# Patient Record
Sex: Female | Born: 1935
Health system: Southern US, Community
[De-identification: ages and names within clinical notes are randomized; demographics above are authoritative.]

## PROBLEM LIST (undated history)

## (undated) DIAGNOSIS — J8410 Pulmonary fibrosis, unspecified: Secondary | ICD-10-CM

## (undated) DIAGNOSIS — Z9851 Tubal ligation status: Secondary | ICD-10-CM

## (undated) DIAGNOSIS — D869 Sarcoidosis, unspecified: Secondary | ICD-10-CM

## (undated) DIAGNOSIS — E7800 Pure hypercholesterolemia, unspecified: Secondary | ICD-10-CM

## (undated) DIAGNOSIS — I10 Essential (primary) hypertension: Secondary | ICD-10-CM

## (undated) DIAGNOSIS — K219 Gastro-esophageal reflux disease without esophagitis: Secondary | ICD-10-CM

## (undated) HISTORY — DX: Essential (primary) hypertension: I10

## (undated) HISTORY — DX: Gastro-esophageal reflux disease without esophagitis: K21.9

## (undated) HISTORY — DX: Pulmonary fibrosis, unspecified: J84.10

## (undated) HISTORY — DX: Sarcoidosis, unspecified: D86.9

## (undated) HISTORY — DX: Tubal ligation status: Z98.51

## (undated) HISTORY — DX: Pure hypercholesterolemia, unspecified: E78.00

---

## 1958-03-19 HISTORY — PX: LUNG SURGERY: SHX703

## 1958-03-19 HISTORY — PX: TUBAL LIGATION: SHX77

## 1998-02-16 ENCOUNTER — Encounter: Payer: Self-pay | Admitting: Internal Medicine

## 1998-02-16 ENCOUNTER — Ambulatory Visit (HOSPITAL_COMMUNITY): Admission: RE | Admit: 1998-02-16 | Discharge: 1998-02-16 | Payer: Self-pay | Admitting: Internal Medicine

## 1999-02-02 ENCOUNTER — Ambulatory Visit (HOSPITAL_COMMUNITY): Admission: RE | Admit: 1999-02-02 | Discharge: 1999-02-02 | Payer: Self-pay | Admitting: Internal Medicine

## 1999-02-02 ENCOUNTER — Encounter: Payer: Self-pay | Admitting: Internal Medicine

## 2000-02-05 ENCOUNTER — Ambulatory Visit (HOSPITAL_COMMUNITY): Admission: RE | Admit: 2000-02-05 | Discharge: 2000-02-05 | Payer: Self-pay | Admitting: Internal Medicine

## 2000-02-05 ENCOUNTER — Encounter: Payer: Self-pay | Admitting: Internal Medicine

## 2005-01-10 ENCOUNTER — Ambulatory Visit: Payer: Self-pay | Admitting: Family Medicine

## 2005-02-14 ENCOUNTER — Ambulatory Visit: Payer: Self-pay | Admitting: Internal Medicine

## 2005-04-18 ENCOUNTER — Ambulatory Visit: Payer: Self-pay

## 2005-06-08 ENCOUNTER — Ambulatory Visit: Payer: Self-pay | Admitting: Family Medicine

## 2005-10-12 ENCOUNTER — Ambulatory Visit: Payer: Self-pay | Admitting: Internal Medicine

## 2005-11-06 ENCOUNTER — Ambulatory Visit: Payer: Self-pay | Admitting: Family Medicine

## 2005-11-14 ENCOUNTER — Encounter: Admission: RE | Admit: 2005-11-14 | Discharge: 2005-11-14 | Payer: Self-pay | Admitting: Family Medicine

## 2006-07-24 ENCOUNTER — Ambulatory Visit: Payer: Self-pay | Admitting: Critical Care Medicine

## 2006-07-24 LAB — CONVERTED CEMR LAB
Angiotensin 1 Converting Enzyme: 34 units/L (ref 9–67)
Anti Nuclear Antibody(ANA): NEGATIVE
Pro B Natriuretic peptide (BNP): 17 pg/mL (ref 0.0–100.0)
Sed Rate: 65 mm/hr — ABNORMAL HIGH (ref 0–25)

## 2006-07-31 ENCOUNTER — Encounter: Payer: Self-pay | Admitting: Critical Care Medicine

## 2006-07-31 ENCOUNTER — Ambulatory Visit: Payer: Self-pay | Admitting: *Deleted

## 2006-08-19 ENCOUNTER — Ambulatory Visit: Payer: Self-pay | Admitting: Critical Care Medicine

## 2006-08-23 ENCOUNTER — Ambulatory Visit: Payer: Self-pay | Admitting: Critical Care Medicine

## 2006-11-20 ENCOUNTER — Encounter: Payer: Self-pay | Admitting: Critical Care Medicine

## 2006-11-20 ENCOUNTER — Ambulatory Visit: Payer: Self-pay | Admitting: Critical Care Medicine

## 2006-11-20 DIAGNOSIS — J45909 Unspecified asthma, uncomplicated: Secondary | ICD-10-CM

## 2006-11-20 DIAGNOSIS — D869 Sarcoidosis, unspecified: Secondary | ICD-10-CM

## 2006-11-20 DIAGNOSIS — J4520 Mild intermittent asthma, uncomplicated: Secondary | ICD-10-CM | POA: Insufficient documentation

## 2007-01-01 ENCOUNTER — Ambulatory Visit (HOSPITAL_COMMUNITY): Admission: RE | Admit: 2007-01-01 | Discharge: 2007-01-01 | Payer: Self-pay | Admitting: Family Medicine

## 2007-01-06 ENCOUNTER — Encounter (INDEPENDENT_AMBULATORY_CARE_PROVIDER_SITE_OTHER): Payer: Self-pay | Admitting: *Deleted

## 2007-02-01 ENCOUNTER — Encounter: Payer: Self-pay | Admitting: Critical Care Medicine

## 2007-02-01 DIAGNOSIS — E7800 Pure hypercholesterolemia, unspecified: Secondary | ICD-10-CM | POA: Insufficient documentation

## 2007-02-01 DIAGNOSIS — J8410 Pulmonary fibrosis, unspecified: Secondary | ICD-10-CM | POA: Insufficient documentation

## 2007-02-27 ENCOUNTER — Ambulatory Visit: Payer: Self-pay | Admitting: Critical Care Medicine

## 2007-05-29 ENCOUNTER — Ambulatory Visit: Payer: Self-pay | Admitting: Family Medicine

## 2007-05-29 DIAGNOSIS — I789 Disease of capillaries, unspecified: Secondary | ICD-10-CM | POA: Insufficient documentation

## 2007-06-03 ENCOUNTER — Ambulatory Visit: Payer: Self-pay | Admitting: Critical Care Medicine

## 2007-06-10 ENCOUNTER — Telehealth: Payer: Self-pay | Admitting: Family Medicine

## 2007-06-11 ENCOUNTER — Encounter: Payer: Self-pay | Admitting: Family Medicine

## 2007-08-18 ENCOUNTER — Ambulatory Visit: Payer: Self-pay | Admitting: Family Medicine

## 2007-08-25 LAB — CONVERTED CEMR LAB
ALT: 20 units/L (ref 0–35)
Basophils Absolute: 0 10*3/uL (ref 0.0–0.1)
Basophils Relative: 1 % (ref 0.0–1.0)
CO2: 26 meq/L (ref 19–32)
Calcium: 9.5 mg/dL (ref 8.4–10.5)
Cholesterol: 301 mg/dL (ref 0–200)
Creatinine, Ser: 0.8 mg/dL (ref 0.4–1.2)
Direct LDL: 223.3 mg/dL
GFR calc Af Amer: 91 mL/min
Glucose, Bld: 102 mg/dL — ABNORMAL HIGH (ref 70–99)
HCT: 35 % — ABNORMAL LOW (ref 36.0–46.0)
Hemoglobin: 12.2 g/dL (ref 12.0–15.0)
Lymphocytes Relative: 30 % (ref 12.0–46.0)
MCHC: 34.7 g/dL (ref 30.0–36.0)
Monocytes Absolute: 0.6 10*3/uL (ref 0.1–1.0)
Monocytes Relative: 14.8 % — ABNORMAL HIGH (ref 3.0–12.0)
Neutro Abs: 1.9 10*3/uL (ref 1.4–7.7)
RBC: 3.8 M/uL — ABNORMAL LOW (ref 3.87–5.11)
RDW: 12.1 % (ref 11.5–14.6)
Sodium: 139 meq/L (ref 135–145)
TSH: 1.71 microintl units/mL (ref 0.35–5.50)
Total Protein: 7.8 g/dL (ref 6.0–8.3)
VLDL: 25 mg/dL (ref 0–40)

## 2007-08-26 ENCOUNTER — Encounter (INDEPENDENT_AMBULATORY_CARE_PROVIDER_SITE_OTHER): Payer: Self-pay | Admitting: *Deleted

## 2007-09-05 ENCOUNTER — Ambulatory Visit: Payer: Self-pay | Admitting: Internal Medicine

## 2007-09-09 ENCOUNTER — Encounter (INDEPENDENT_AMBULATORY_CARE_PROVIDER_SITE_OTHER): Payer: Self-pay | Admitting: *Deleted

## 2007-09-16 ENCOUNTER — Encounter (INDEPENDENT_AMBULATORY_CARE_PROVIDER_SITE_OTHER): Payer: Self-pay | Admitting: *Deleted

## 2007-09-16 ENCOUNTER — Ambulatory Visit: Payer: Self-pay | Admitting: Family Medicine

## 2007-09-17 ENCOUNTER — Encounter: Payer: Self-pay | Admitting: Internal Medicine

## 2007-09-17 ENCOUNTER — Ambulatory Visit: Payer: Self-pay | Admitting: Internal Medicine

## 2007-10-17 ENCOUNTER — Telehealth: Payer: Self-pay | Admitting: Internal Medicine

## 2007-11-04 ENCOUNTER — Telehealth (INDEPENDENT_AMBULATORY_CARE_PROVIDER_SITE_OTHER): Payer: Self-pay | Admitting: *Deleted

## 2007-11-05 ENCOUNTER — Ambulatory Visit: Payer: Self-pay | Admitting: Internal Medicine

## 2007-11-05 DIAGNOSIS — M949 Disorder of cartilage, unspecified: Secondary | ICD-10-CM | POA: Insufficient documentation

## 2007-11-05 DIAGNOSIS — M899 Disorder of bone, unspecified: Secondary | ICD-10-CM | POA: Insufficient documentation

## 2007-11-11 ENCOUNTER — Telehealth (INDEPENDENT_AMBULATORY_CARE_PROVIDER_SITE_OTHER): Payer: Self-pay | Admitting: *Deleted

## 2007-11-13 ENCOUNTER — Encounter: Payer: Self-pay | Admitting: Internal Medicine

## 2007-11-13 ENCOUNTER — Ambulatory Visit: Payer: Self-pay

## 2007-11-19 ENCOUNTER — Ambulatory Visit: Payer: Self-pay | Admitting: Critical Care Medicine

## 2007-11-20 ENCOUNTER — Encounter (INDEPENDENT_AMBULATORY_CARE_PROVIDER_SITE_OTHER): Payer: Self-pay | Admitting: *Deleted

## 2007-11-21 ENCOUNTER — Ambulatory Visit: Payer: Self-pay | Admitting: Cardiovascular Disease

## 2007-11-23 ENCOUNTER — Encounter: Payer: Self-pay | Admitting: Critical Care Medicine

## 2007-12-08 ENCOUNTER — Ambulatory Visit: Payer: Self-pay | Admitting: Internal Medicine

## 2008-01-30 ENCOUNTER — Ambulatory Visit (HOSPITAL_COMMUNITY): Admission: RE | Admit: 2008-01-30 | Discharge: 2008-01-30 | Payer: Self-pay | Admitting: Internal Medicine

## 2008-02-09 ENCOUNTER — Encounter (INDEPENDENT_AMBULATORY_CARE_PROVIDER_SITE_OTHER): Payer: Self-pay | Admitting: *Deleted

## 2008-03-08 ENCOUNTER — Ambulatory Visit: Payer: Self-pay | Admitting: Internal Medicine

## 2008-04-07 ENCOUNTER — Ambulatory Visit: Payer: Self-pay | Admitting: Internal Medicine

## 2008-04-09 ENCOUNTER — Telehealth (INDEPENDENT_AMBULATORY_CARE_PROVIDER_SITE_OTHER): Payer: Self-pay | Admitting: *Deleted

## 2008-04-09 LAB — CONVERTED CEMR LAB
ALT: 13 units/L (ref 0–35)
AST: 19 units/L (ref 0–37)
Direct LDL: 221.1 mg/dL

## 2008-09-22 ENCOUNTER — Ambulatory Visit: Payer: Self-pay | Admitting: Internal Medicine

## 2008-09-22 DIAGNOSIS — M1990 Unspecified osteoarthritis, unspecified site: Secondary | ICD-10-CM | POA: Insufficient documentation

## 2008-09-22 DIAGNOSIS — R1310 Dysphagia, unspecified: Secondary | ICD-10-CM | POA: Insufficient documentation

## 2008-09-28 ENCOUNTER — Ambulatory Visit: Payer: Self-pay | Admitting: Internal Medicine

## 2008-09-28 LAB — CONVERTED CEMR LAB
BUN: 29 mg/dL — ABNORMAL HIGH (ref 6–23)
Creatinine, Ser: 0.8 mg/dL (ref 0.4–1.2)

## 2008-10-01 LAB — CONVERTED CEMR LAB
Cholesterol: 303 mg/dL — ABNORMAL HIGH (ref 0–200)
HDL: 57 mg/dL (ref >39.00)
Triglycerides: 98 mg/dL (ref 0.0–149.0)
Vit D, 25-Hydroxy: 27 ng/mL — ABNORMAL LOW (ref 30–89)

## 2008-10-05 ENCOUNTER — Ambulatory Visit: Payer: Self-pay | Admitting: Internal Medicine

## 2008-10-05 ENCOUNTER — Encounter (INDEPENDENT_AMBULATORY_CARE_PROVIDER_SITE_OTHER): Payer: Self-pay | Admitting: *Deleted

## 2008-10-06 ENCOUNTER — Encounter (INDEPENDENT_AMBULATORY_CARE_PROVIDER_SITE_OTHER): Payer: Self-pay | Admitting: *Deleted

## 2008-10-11 ENCOUNTER — Encounter (INDEPENDENT_AMBULATORY_CARE_PROVIDER_SITE_OTHER): Payer: Self-pay | Admitting: *Deleted

## 2008-10-11 LAB — CONVERTED CEMR LAB: Fecal Occult Bld: NEGATIVE

## 2008-10-13 ENCOUNTER — Encounter: Admission: RE | Admit: 2008-10-13 | Discharge: 2008-10-13 | Payer: Self-pay | Admitting: Internal Medicine

## 2008-11-10 IMAGING — CT CT CHEST W/O CM
2 of 5 series · 12 of 36 positions shown, 15 images · IV contrast (agent unspecified)
Comparison: Chest x-ray of 07/24/06

CLINICAL DATA: History of pneumonia with cough in [REDACTED].  Shortness of breath upon exertion.  Evaluate for pulmonary fibrosis.  
 CHEST CT WITHOUT CONTRAST:
TECHNIQUE: Multidetector CT imaging of the chest was performed following the standard protocol without IV contrast.  High resolution imaging of the lungs was performed.

[Series 2: chest_hi_res 5.0 b40f st · axial · 0.60mm/px · z∈[+998,+1222]mm · 9 of 57 slices shown, 12 images]
[im 6/57  mediastinal]
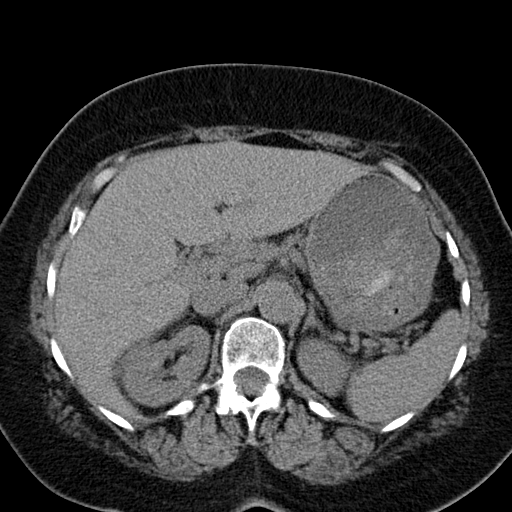
[im 6/57  lung]
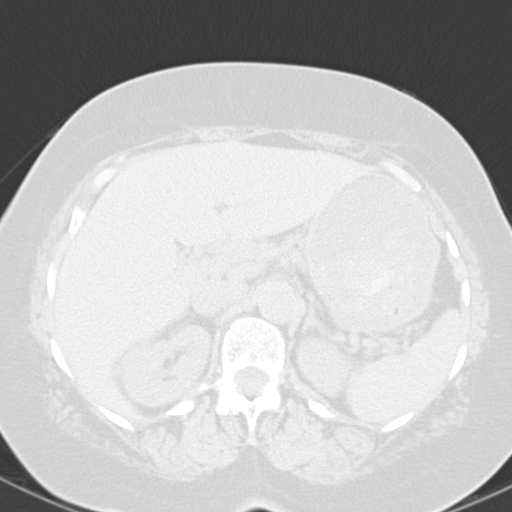
[im 12/57  lung]
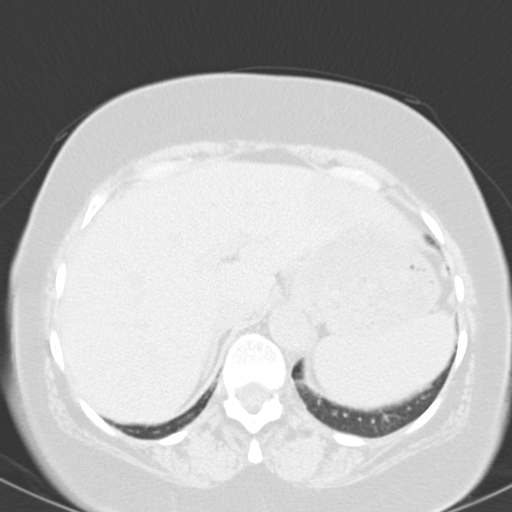
[im 17/57  lung]
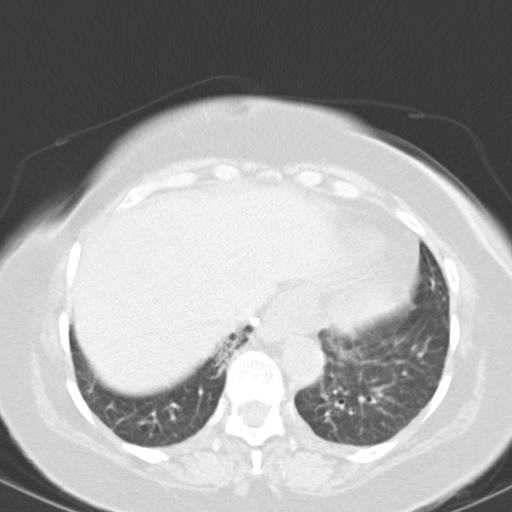
[im 23/57  lung]
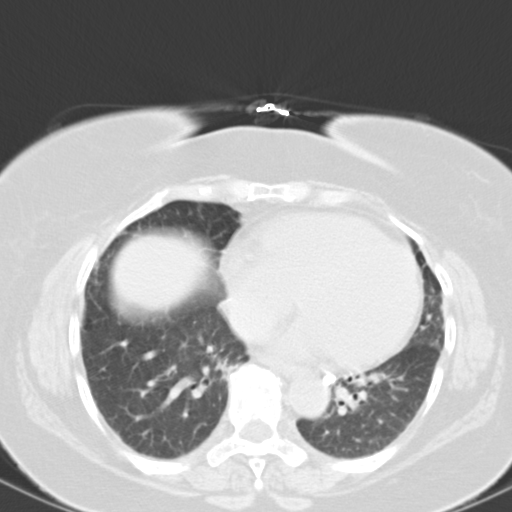
[im 29/57  mediastinal]
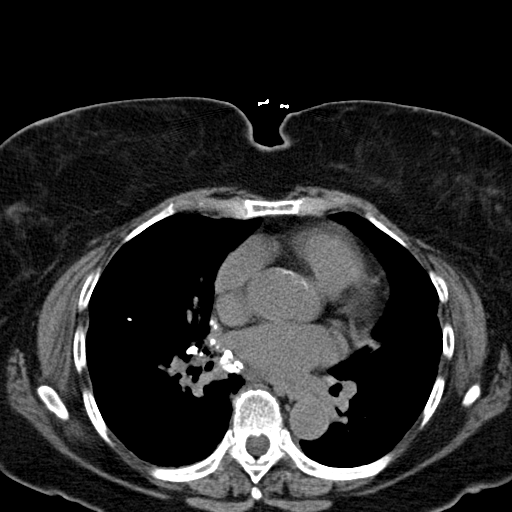
[im 29/57  lung]
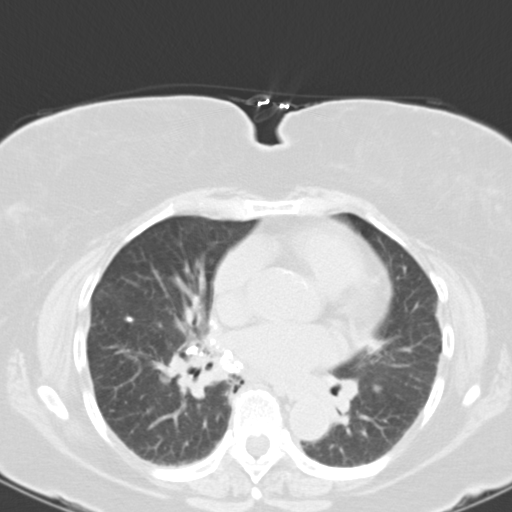
[im 34/57  lung]
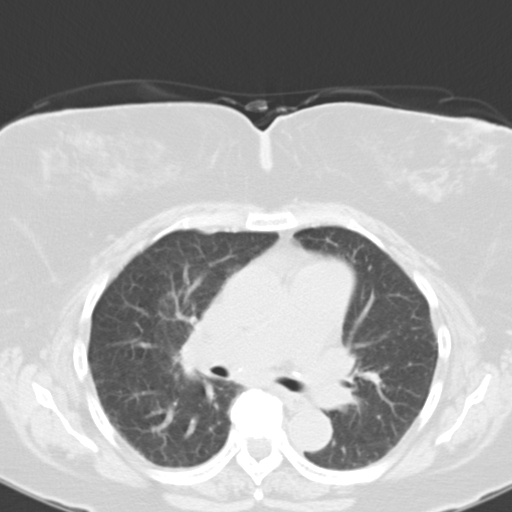
[im 40/57  lung]
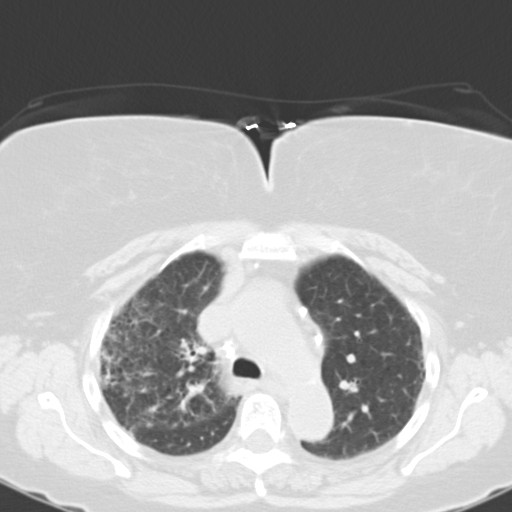
[im 45/57  lung]
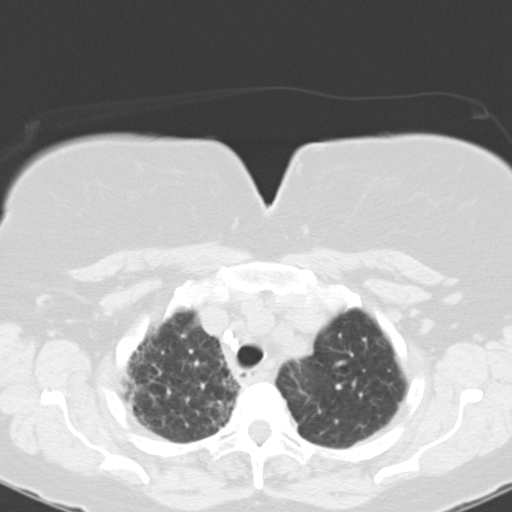
[im 51/57  mediastinal]
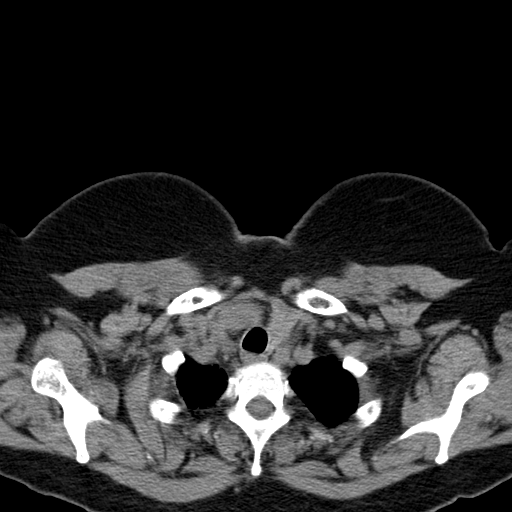
[im 51/57  lung]
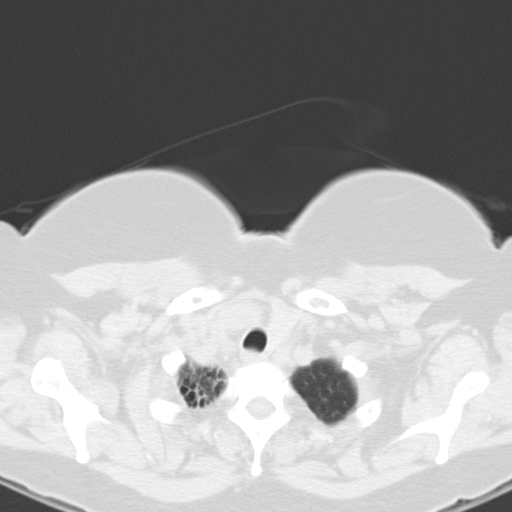

[Series 602: <mpr thick range> · coronal · 0.60mm/px · 3 of 82 slices shown]
[im 17/82  lung]
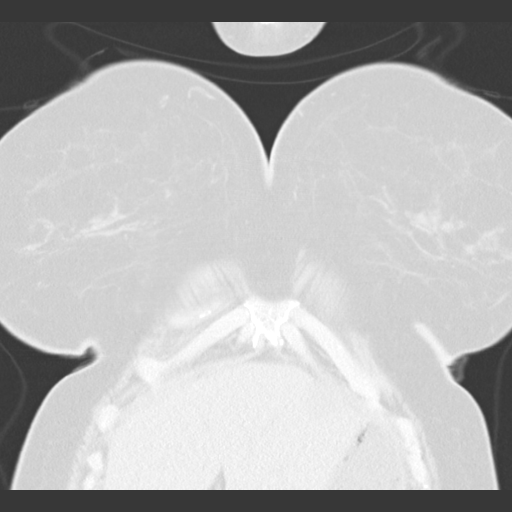
[im 33/82  lung]
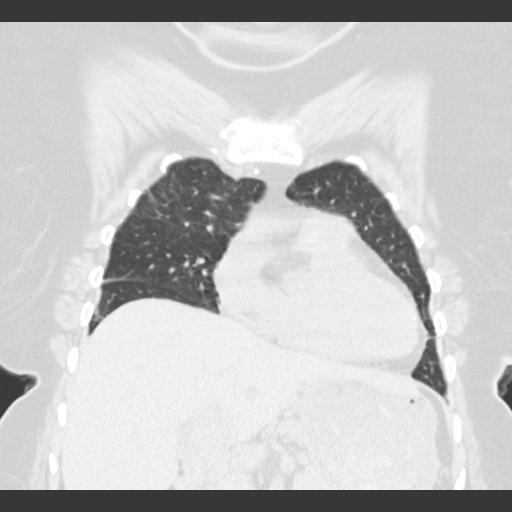
[im 49/82  lung]
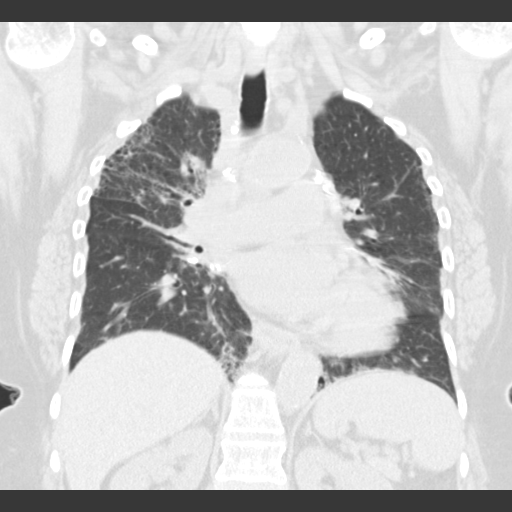

[12 of 36 positions shown; findings below may reference images not displayed]

FINDINGS: High right paratracheal lymph node is borderline in size measuring 1 cm in short axis.  There are other calcified lymph nodes in the mediastinum and both hilar regions.  Main pulmonary artery measures 3.1 cm.  Heart size is within upper limits of normal to mildly enlarged.  No pericardial fluid.  Small hiatal hernia with distal esophageal wall thickening.  
 Interstitial thickening and volume loss are worse in the right upper lobe.  Calcified granuloma is seen in the right upper lobe as well.  Mild subpleural opacities are seen bilaterally.  No pleural fluid.  Airway unremarkable.  
 A low-density lesion in the left hepatic lobe measures 3.6 x 2.8 cm.  No worrisome lytic or sclerotic lesions.
IMPRESSION: 1.  Combination of calcified/noncalcified mediastinal and hilar lymph nodes together with pulmonary parenchymal findings suggest sarcoid.  Post infectious scarring in the right upper lobe is another consideration.  
 2.  Pulmonary arterial hypertension.  Borderline heart size.  
 3.  Indeterminate left hepatic lobe lesion.  This cannot be characterized as a cyst on today?s study.  MR would be helpful in further evaluation, as clinically indicated.

## 2008-12-18 ENCOUNTER — Ambulatory Visit: Payer: Self-pay | Admitting: Internal Medicine

## 2008-12-18 ENCOUNTER — Telehealth: Payer: Self-pay | Admitting: Internal Medicine

## 2008-12-22 ENCOUNTER — Ambulatory Visit: Payer: Self-pay | Admitting: Internal Medicine

## 2008-12-24 ENCOUNTER — Telehealth: Payer: Self-pay | Admitting: Internal Medicine

## 2008-12-27 ENCOUNTER — Telehealth: Payer: Self-pay | Admitting: Internal Medicine

## 2008-12-28 ENCOUNTER — Ambulatory Visit: Payer: Self-pay | Admitting: Internal Medicine

## 2008-12-31 ENCOUNTER — Telehealth (INDEPENDENT_AMBULATORY_CARE_PROVIDER_SITE_OTHER): Payer: Self-pay | Admitting: *Deleted

## 2008-12-31 ENCOUNTER — Encounter (INDEPENDENT_AMBULATORY_CARE_PROVIDER_SITE_OTHER): Payer: Self-pay | Admitting: *Deleted

## 2009-01-10 ENCOUNTER — Encounter: Admission: RE | Admit: 2009-01-10 | Discharge: 2009-02-04 | Payer: Self-pay | Admitting: Internal Medicine

## 2009-01-11 ENCOUNTER — Encounter: Payer: Self-pay | Admitting: Internal Medicine

## 2009-01-19 ENCOUNTER — Ambulatory Visit: Payer: Self-pay | Admitting: Internal Medicine

## 2009-02-03 ENCOUNTER — Encounter: Payer: Self-pay | Admitting: Internal Medicine

## 2009-03-02 ENCOUNTER — Ambulatory Visit (HOSPITAL_COMMUNITY): Admission: RE | Admit: 2009-03-02 | Discharge: 2009-03-02 | Payer: Self-pay | Admitting: Internal Medicine

## 2009-03-07 ENCOUNTER — Telehealth (INDEPENDENT_AMBULATORY_CARE_PROVIDER_SITE_OTHER): Payer: Self-pay | Admitting: *Deleted

## 2009-03-08 ENCOUNTER — Encounter (INDEPENDENT_AMBULATORY_CARE_PROVIDER_SITE_OTHER): Payer: Self-pay | Admitting: *Deleted

## 2009-09-29 ENCOUNTER — Ambulatory Visit: Payer: Self-pay | Admitting: Internal Medicine

## 2009-09-29 LAB — CONVERTED CEMR LAB: Vit D, 25-Hydroxy: 39 ng/mL (ref 30–89)

## 2009-09-30 ENCOUNTER — Encounter (INDEPENDENT_AMBULATORY_CARE_PROVIDER_SITE_OTHER): Payer: Self-pay | Admitting: *Deleted

## 2009-10-03 LAB — CONVERTED CEMR LAB
Basophils Absolute: 0 10*3/uL (ref 0.0–0.1)
Basophils Relative: 0.7 % (ref 0.0–3.0)
CO2: 28 meq/L (ref 19–32)
Calcium: 9.8 mg/dL (ref 8.4–10.5)
Creatinine, Ser: 0.9 mg/dL (ref 0.4–1.2)
Eosinophils Absolute: 0.3 10*3/uL (ref 0.0–0.7)
GFR calc non Af Amer: 82.8 mL/min (ref >60)
HDL: 61.3 mg/dL (ref >39.00)
Lymphocytes Relative: 47.3 % — ABNORMAL HIGH (ref 12.0–46.0)
MCHC: 34.1 g/dL (ref 30.0–36.0)
Monocytes Relative: 11.6 % (ref 3.0–12.0)
Neutrophils Relative %: 35.1 % — ABNORMAL LOW (ref 43.0–77.0)
RBC: 4.05 M/uL (ref 3.87–5.11)
TSH: 2.19 microintl units/mL (ref 0.35–5.50)
Triglycerides: 174 mg/dL — ABNORMAL HIGH (ref 0.0–149.0)
WBC: 4.8 10*3/uL (ref 4.5–10.5)

## 2009-10-12 ENCOUNTER — Encounter: Admission: RE | Admit: 2009-10-12 | Discharge: 2009-10-12 | Payer: Self-pay | Admitting: Internal Medicine

## 2009-10-12 ENCOUNTER — Encounter: Payer: Self-pay | Admitting: Internal Medicine

## 2009-10-28 ENCOUNTER — Telehealth: Payer: Self-pay | Admitting: Internal Medicine

## 2010-03-03 ENCOUNTER — Ambulatory Visit (HOSPITAL_COMMUNITY)
Admission: RE | Admit: 2010-03-03 | Discharge: 2010-03-03 | Payer: Self-pay | Source: Home / Self Care | Attending: Internal Medicine | Admitting: Internal Medicine

## 2010-03-21 ENCOUNTER — Ambulatory Visit
Admission: RE | Admit: 2010-03-21 | Discharge: 2010-03-21 | Payer: Self-pay | Source: Home / Self Care | Attending: Internal Medicine | Admitting: Internal Medicine

## 2010-03-23 ENCOUNTER — Encounter: Payer: Self-pay | Admitting: Gastroenterology

## 2010-03-23 LAB — CONVERTED CEMR LAB
Basophils Absolute: 0 10*3/uL (ref 0.0–0.1)
HCT: 37.7 % (ref 36.0–46.0)
Lymphs Abs: 1.4 10*3/uL (ref 0.7–4.0)
MCV: 91.5 fL (ref 78.0–100.0)
Monocytes Absolute: 0.5 10*3/uL (ref 0.1–1.0)
Platelets: 296 10*3/uL (ref 150.0–400.0)
RDW: 13.3 % (ref 11.5–14.6)

## 2010-04-16 LAB — CONVERTED CEMR LAB: Pap Smear: NORMAL

## 2010-04-18 NOTE — Assessment & Plan Note (Signed)
Summary: yearly check/cbs   Vital Signs:  Patient profile:   77 year old female Height:      61.5 inches Weight:      196 pounds BMI:     36.57 Pulse rate:   80 / minute Pulse rhythm:   regular BP sitting:   128 / 86  (left arm) Cuff size:   large  Vitals Entered By: Army Fossa CMA (September 29, 2009 2:50 PM) CC: Yearly Eval Comments Decline Pneumovax.    History of Present Illness: Here for Medicare AWV: 1.   Risk factors based on Past M, S, F history: YES  2.   Physical Activities: still works, active, exercises in the "Y" 3.   Depression/mood: mood normal, denies depression  4.   Hearing: normal, no problems  5.   ADL's: totally independent 6.   Fall Risk: no recent falls, low risk 7.   Home Safety: reviewed, has her fire detectors updated  8.   Height, weight, &visual acuity:see VS,  vision corrected 9.   Counseling: done, see below  10.   Labs ordered based on risk factors:  yes  11.           Referral Coordination-- yes  12.           Care Plan:  see a/p  13.            Cognitive Assessment : memory, alertness and motor skills seem appropriate  in addition to her physical exam, we assesed  the following issues: -complained of "weakness and soreness" at the anterior aspect of her right thight, symptoms triggered by walking. she denies back pain or a rash in that area. An OTC cream ("deep-heat") used to help -has noted a cyst in her face below the left jaw -HYPERCHOLESTEROLEMIA -- on fish oil  and lifestyle modification. She does very well with diet and exercise -ASTHMA --  asx most of the year, very few times a year she does need a inhaler     Allergies (verified): No Known Drug Allergies  Past History:  Past Medical History: PULMONARY FIBROSIS and h/o SARCOIDOSIS   HYPERCHOLESTEROLEMIA   ASTHMA  8-09: Normal stress test Abnormal CT liver 09-2008: thyroid nodule?, ultrasound was negative  Past Surgical History: Reviewed history from 09/22/2008 and no  changes required. surgery on rt lung (1960's)  Family History: Reviewed history from 09/22/2008 and no changes required. 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 Resp:  Denies cough and shortness of breath. GI:  Denies bloody stools, diarrhea, nausea, and vomiting. GU:  Denies dysuria and hematuria. Psych:  Denies anxiety and depression.  Physical Exam  General:  alert, well-developed, and overweight-appearing.   Head:  under the left jaw she has a 3/4 cm sebaceous cyst (noted some sebaceous d/c) Neck:  no masses and no thyromegaly.   Breasts:  bruise at the upper external quadrant of the  R breast, no hematoma that I can tell, very tender to palpation ,no axillary lymph nodes Lungs:  normal respiratory effort, no intercostal retractions, no  accessory muscle use, and normal breath sounds.   Heart:  normal rate, regular rhythm, and no murmur.   Abdomen:  soft, non-tender, no distention, no masses, no guarding, and no rigidity.   Msk:  left hip full range of motion without pain Right hip with full range of motion, some soreness elicited with inward  rotation Pulses:  good pedal and femoral pulses bilaterally Extremities:  no lower extremity edema no tender to palpation at the proximal right lower extremity Neurologic:  alert & oriented X3, strength normal in all extremities, and gait normal.   Psych:  not anxious appearing and not depressed appearing.     Impression & Recommendations:  Problem # 1:  HEALTH SCREENING (ICD-V70.0)  Td 2004 has previously declined pneumovax , declined  again explained benefits  shingle shot information provided   has been reluctant to see gynecology Last Mammogram--02-2009 negative breast exam today and normal Last PAP Smear:  03/19/2001, n   Results:  normal --- declined any more PAPs , explained that i Rec one q 3 years    Last Colonoscopy:  03/19/1988,    Results:  normal  iFOB neg 7-10 "i would do a iFOB but not a Cscope" iFOB provided  Orders: First annual wellness visit with prevention plan  (V7846)  Problem # 2:  DEGENERATIVE JOINT DISEASE (ICD-715.90) right lower extremity pain likely due to  OA  recommend Mobic The following medications were removed from the medication list:    Tramadol Hcl 50 Mg Tabs (Tramadol hcl) .Marland Kitchen... 1/2-1 three times a day as needed for pain Her updated medication list for this problem includes:    Baby Aspirin 81 Mg Chew (Aspirin) .Marland Kitchen... 1 a day    Meloxicam 15 Mg Tabs (Meloxicam) ..... One by mouth daily as needed for pain  Problem # 3:  OSTEOPENIA (ICD-733.90) DEXA  7-09: osteopenia History of low vitamin D, ergocalciferol was prescribed Plan: DEXA and vitamin D check    Orders: TLB-CBC Platelet - w/Differential (85025-CBCD) T-Vitamin D (25-Hydroxy) (96295-28413) Venipuncture (24401) Specimen Handling (02725) Radiology Referral (Radiology)  Problem # 4:  HYPERCHOLESTEROLEMIA (ICD-272.0) labs Labs Reviewed: SGOT: 23 (09/28/2008)   SGPT: 13 (09/28/2008)   HDL:57.00 (09/28/2008), 49.6 (04/07/2008)  LDL:DEL (04/07/2008), DEL (08/18/2007)  Chol:303 (09/28/2008), 292 (04/07/2008)  Trig:98.0 (09/28/2008), 105 (04/07/2008)  Orders: TLB-BMP (Basic Metabolic Panel-BMET) (80048-METABOL) TLB-Lipid Panel (80061-LIPID) TLB-TSH (Thyroid Stimulating Hormone) (84443-TSH) TLB-ALT (SGPT) (84460-ALT) TLB-AST (SGOT) (84450-SGOT)  Problem # 5:  ASTHMA (ICD-493.90) well-controlled, rarely use albuterol Her updated medication list for this problem includes:    Proair Hfa 108 (90 Base) Mcg/act Aers  (Albuterol sulfate) .Marland Kitchen... 1-2 puffs every 4-6 hours as needed    Complete Medication List: 1)  Proair Hfa 108 (90 Base) Mcg/act Aers (Albuterol sulfate) .Marland Kitchen.. 1-2 puffs every 4-6 hours as needed 2)  Baby Aspirin 81 Mg Chew (Aspirin) .Marland Kitchen.. 1 a day 3)  Calcium 1000mg  A Day With Vit D 600 To 1000u A Day  4)  Fish Oil 1000 Mg Caps (Omega-3 fatty acids) 5)  Meloxicam 15 Mg Tabs (Meloxicam) .... One by mouth daily as needed for pain  Patient Instructions: 1)  for pain, take  meloxicam 15 mg mg one a day. Always take it with food. Stop  this medication if it upset your stomach  2)  Please schedule a follow-up appointment in 1 year.  Prescriptions: MELOXICAM 15 MG TABS (MELOXICAM) one by mouth daily as needed for pain  #30 x 1   Entered and Authorized by:  Anastassia Noack E. Lujain Kraszewski MD   Signed by:   Nolon Rod. Neena Beecham MD on 09/29/2009   Method used:   Print then Give to Patient   RxID:   [id number]

## 2010-04-18 NOTE — Letter (Signed)
Summary: [facility]: Immunoassay Fecal Occult Blood (iFOB) Order Form  [facility] [place]  [address] [place], [place] [address]   Phone: [phone]  Fax: [phone]      [facility]: Immunoassay Fecal Occult Blood (iFOB) Order Form   September 30, 2009 MRN: [id number]   Felicia Martin 1932-07-12   Physicican Name:_____Jose Drue Novel, MD____________________  Diagnosis Code:______v76.51____________________      Army Fossa CMA

## 2010-04-18 NOTE — Progress Notes (Signed)
Summary: refill  Phone Note Refill Request   Refills Requested: Medication #1:  MELOXICAM 15 MG TABS one by mouth daily as needed for pain   Last Refilled: 09/29/2009 Pharm- [facility] [place] [facility] that this helped with her knee pain. Army Fossa CMA  October 28, 2009 1:09 PM    Follow-up for Phone Call        ok 30, no Rf Deidre Carino E. Coltrane Tugwell MD  October 28, 2009 5:04 PM   Additional Follow-up for Phone Call Additional follow up Details #1::        Pt is aware. Army Fossa CMA  October 28, 2009 5:07 PM     Prescriptions: MELOXICAM 15 MG TABS (MELOXICAM) one by mouth daily as needed for pain  #30 x 1   Entered by:   Army Fossa CMA   Authorized by:   Nolon Rod. Ellakate Gonsalves MD   Signed by:   Army Fossa CMA on 10/28/2009   Method used:   Electronically to        [facility] [address] (retail)       [phone]       [place], [place]  [address]       Ph: [phone]       Fax: [phone]   RxID:   [id number]  Cancelled the refill on Rx. Army Fossa CMA  October 28, 2009 5:05 PM

## 2010-04-20 NOTE — Letter (Signed)
Summary: New Patient letter  [facility]  [address] [place], [place] [address]   Phone: [phone]  Fax: [phone]       03/23/2010 MRN: [id number]  [facility] [address] [place], [place]  [address]  Dear Felicia Martin,  Welcome to the Gastroenterology Division at [facility].    You are scheduled to see Dr.   Christella Hartigan on 05-01-10 at 9am on the 3rd floor at [facility], [address]  We ask that you try to arrive at our office 15 minutes prior to your appointment time to allow for check-in.  We would like you to complete the enclosed self-administered evaluation form prior to your visit and bring it with you on the day of your appointment.  We will review it with you.  Also, please bring a complete list of all your medications or, if you prefer, bring the medication bottles and we will list them.  Please bring your insurance card so that we may make a copy of it.  If your insurance requires a referral to see a specialist, please bring your referral form from your primary care physician.  Co-payments are due at the time of your visit and may be paid by cash, check or credit card.     Your office visit will consist of a consult with your physician (includes a physical exam), any laboratory testing he/she may order, scheduling of any necessary diagnostic testing (e.g. x-ray, ultrasound, CT-scan), and scheduling of a procedure (e.g. Endoscopy, Colonoscopy) if required.  Please allow enough time on your schedule to allow for any/all of these possibilities.    If you cannot keep your appointment, please call [phone] to cancel or reschedule prior to your appointment date.  This allows [place] the opportunity to schedule an appointment for another patient in need of care.  If you do not cancel or reschedule by 5 p.m. the business day prior to your appointment date, you will be charged a $50.00 late cancellation/no-show fee.    Thank you for choosing  [facility] for your medical needs.  We appreciate the opportunity to care for you.  Please visit [place] at our website  to learn more about our practice.                     Sincerely,                                                             The Gastroenterology Division

## 2010-04-20 NOTE — Assessment & Plan Note (Signed)
Summary: FOLLOWUP/KN   Vital Signs:  Patient profile:   77 year old female Weight:      202 pounds Pulse rate:   70 / minute Pulse rhythm:   regular BP sitting:   128 / 88  (left arm) Cuff size:   large  Vitals Entered By: Army Fossa CMA (March 21, 2010 10:30 AM) CC: Follow up visit- not fasting  Comments c/o having indgestion x 2-3 weeks. Unable to eat without getting  [facility] [address]    History of Present Illness: base on the last FLP, was Rx  Zocor, she took it  for 2 months but she self discontinued it   ~ one  month ago due to GI symptoms. See next.  for several weeks is noting  "indigestion" described as food getting stuck in her throat when she tries to swallow, also whenever she lies down the food seems to come up to her throat, causing upper chest discomfort and even left shoulder discomfort. There is no associated shortness of breath but a lot of burping.     Current Medications (verified): 1)  Proair Hfa 108 (90 Base) Mcg/act  Aers (Albuterol Sulfate) .Marland Kitchen.. 1-2 Puffs Every 4-6 Hours As Needed 2)  Baby Aspirin 81 Mg  Chew (Aspirin) .Marland Kitchen.. 1 A Day 3)  Calcium 1000mg  A Day With Vit D 600 To 1000u A Day 4)  Fish Oil 1000 Mg Caps (Omega-3 Fatty Acids) 5)  Meloxicam 15 Mg Tabs (Meloxicam) .... One By Mouth Daily As Needed For Pain  Allergies (verified): No Known Drug Allergies  Past History:  Past Medical History: Reviewed history from 09/29/2009 and no changes required. PULMONARY FIBROSIS and h/o SARCOIDOSIS   HYPERCHOLESTEROLEMIA   ASTHMA  8-09: Normal stress test Abnormal CT liver 09-2008: thyroid nodule?, ultrasound was negative  Past Surgical History: Reviewed history from 09/22/2008 and no changes required. surgery on rt lung (1960's)  Review of Systems General:  Denies chills and fever. CV:  Denies fainting, palpitations, and swelling of feet; no SSCP . Resp:  Denies coughing up blood; some cough, dry  asthma always well controlled up until last  week: used inhaler 2-3 /day. GI:  Denies abdominal pain, bloody stools, nausea, and vomiting.  Physical Exam  General:  alert, well-developed, and overweight-appearing.   Neck:  no lymphadenopathy, no supraclavicular mass Lungs:  normal respiratory effort, no intercostal retractions, no accessory muscle use, and normal breath sounds.   Heart:  normal rate, regular rhythm, and no murmur.   Abdomen:  soft, normal bowel sounds, no distention, no masses, no guarding, and no rigidity.     Impression & Recommendations:  Problem # 1:  DYSPHAGIA UNSPECIFIED (NWG-956.21) Assessment New 77 year old lady with a new onset of dysphagia and apparently heartburn. ( she  had  dysphagia 2010 for a few days, sx self resolved) plan: PPIs Chest x-ray GI referral  Orders: Venipuncture (30865) TLB-CBC Platelet - w/Differential (85025-CBCD) T-2 View CXR (71020TC) Specimen Handling (78469) Gastroenterology Referral (GI)  Problem # 2:  HYPERCHOLESTEROLEMIA (ICD-272.0) based on her last FLP, she was prescribed cholesterol medicine but d/t  to GI symptoms (see #1) she discontinue the statins reassess situation when she comes back The following medications were removed from the medication list:    Zocor 40 Mg Tabs (Simvastatin) .Marland Kitchen... 1 by mouth at bedtime.  Labs Reviewed: SGOT: 21 (09/29/2009)   SGPT: 13 (09/29/2009)   HDL:61.30 (09/29/2009), 57.00 (09/28/2008)  LDL:DEL (04/07/2008), DEL (08/18/2007)  Chol:289 (09/29/2009), 303 (09/28/2008)  Trig:174.0 (09/29/2009), 98.0 (09/28/2008)  Problem # 3:  ASTHMA (ICD-493.90) symptoms recently exacerbated, likely due to GERD Her updated medication list for this problem includes:    Proair Hfa 108 (90 Base) Mcg/act Aers (Albuterol sulfate) .Marland Kitchen... 1-2 puffs every 4-6 hours as needed  Complete Medication List: 1)  Proair Hfa 108 (90 Base) Mcg/act Aers (Albuterol sulfate) .Marland Kitchen.. 1-2 puffs every 4-6 hours as needed 2)  Baby Aspirin 81 Mg Chew (Aspirin) .Marland Kitchen.. 1 a  day 3)  Calcium 1000mg  A Day With Vit D 600 To 1000u A Day  4)  Fish Oil 1000 Mg Caps (Omega-3 fatty acids) 5)  Meloxicam 15 Mg Tabs (Meloxicam) .... One by mouth daily as needed for pain 6)  Pantoprazole Sodium 40 Mg Tbec (Pantoprazole sodium) .... One by mouth before breakfast on a empty stomach  Patient Instructions: 1)  start pantoprazole as directed 2)  If unable to get pantoprazole , get prilosec 20 mg over-the-counter and take 2 tablets before breakfast 3)  Please schedule a follow-up appointment in 2 to 3 months , fasting Prescriptions: PANTOPRAZOLE SODIUM 40 MG TBEC (PANTOPRAZOLE SODIUM) One by mouth before breakfast on a empty stomach  #30 x 3   Entered and Authorized by:   Nolon Rod. Kyerra Vargo MD   Signed by:   Nolon Rod. Saryiah Bencosme MD on 03/21/2010   Method used:   Print then Give to Patient   RxID:   [id number]    Orders Added: 1)  Venipuncture [01027] 2)  TLB-CBC Platelet - w/Differential [85025-CBCD] 3)  T-2 View CXR [71020TC] 4)  Specimen Handling [99000] 5)  Est. Patient Level IV [25366] 6)  Gastroenterology Referral [GI]    Prevention & Chronic Care Immunizations   Influenza vaccine: Not documented    Tetanus booster: 03/19/2002: Td    Pneumococcal vaccine: Not documented    H. zoster vaccine: Not documented  Colorectal Screening   Hemoccult: Not documented    Colonoscopy: normal  (03/19/1988)  Other Screening   Pap smear: normal  (03/19/2001)    Mammogram: normal- repeat 1 year  (03/03/2010)    DXA bone density scan: normal  (03/19/2005)   Smoking status: quit  (05/29/2007)  Lipids   Total Cholesterol: 289  (09/29/2009)   LDL: DEL  (04/07/2008)   LDL Direct: 213.7  (09/29/2009)   HDL: 61.30  (09/29/2009)   Triglycerides: 174.0  (09/29/2009)    SGOT (AST): 21  (09/29/2009)   SGPT (ALT): 13  (09/29/2009)   Alkaline phosphatase: 63  (08/18/2007)   Total bilirubin: 0.7  (08/18/2007)  Self-Management Support :    Lipid self-management support: Not  documented

## 2010-05-01 ENCOUNTER — Ambulatory Visit: Payer: Self-pay | Admitting: Gastroenterology

## 2010-05-04 ENCOUNTER — Encounter: Payer: Self-pay | Admitting: Family Medicine

## 2010-05-04 ENCOUNTER — Ambulatory Visit: Payer: Self-pay | Admitting: Family Medicine

## 2010-05-04 ENCOUNTER — Ambulatory Visit (INDEPENDENT_AMBULATORY_CARE_PROVIDER_SITE_OTHER): Payer: Medicare Other | Admitting: Family Medicine

## 2010-05-04 DIAGNOSIS — N390 Urinary tract infection, site not specified: Secondary | ICD-10-CM

## 2010-05-04 LAB — CONVERTED CEMR LAB
Glucose, Urine, Semiquant: NEGATIVE
pH: 5

## 2010-05-05 ENCOUNTER — Encounter: Payer: Self-pay | Admitting: Family Medicine

## 2010-05-10 NOTE — Assessment & Plan Note (Signed)
Summary: bladder/kidney infection/kn   Vital Signs:  Patient profile:   77 year old female Height:      61.5 inches Weight:      201 pounds Temp:     98.4 degrees F oral Pulse rate:   76 / minute BP sitting:   160 / 76  (left arm)  Vitals Entered By: Jeremy Johann CMA (May 04, 2010 3:35 PM) CC: burning and frequency with urination, Dysuria   History of Present Illness:  Dysuria      This is a 77 year old woman who presents with Dysuria.  The symptoms began 2 days ago.  The patient complains of burning with urination, urinary frequency, and urgency, but denies hematuria, vaginal discharge, vaginal itching, and vaginal sores.  The patient denies the following associated symptoms: nausea, vomiting, fever, shaking chills, flank pain, abdominal pain, back pain, pelvic pain, and arthralgias.  The patient denies the following risk factors: diabetes, prior antibiotics, immunosuppression, history of GU anomaly, history of pyelonephritis, pregnancy, history of STD, and analgesic abuse.  History is significant for no urinary tract problems.    Current Medications (verified): 1)  Proair Hfa 108 (90 Base) Mcg/act  Aers (Albuterol Sulfate) .Marland Kitchen.. 1-2 Puffs Every 4-6 Hours As Needed 2)  Baby Aspirin 81 Mg  Chew (Aspirin) .Marland Kitchen.. 1 A Day 3)  Calcium 1000mg  A Day With Vit D 600 To 1000u A Day 4)  Fish Oil 1000 Mg Caps (Omega-3 Fatty Acids) 5)  Meloxicam 15 Mg Tabs (Meloxicam) .... One By Mouth Daily As Needed For Pain 6)  Pantoprazole Sodium 40 Mg Tbec (Pantoprazole Sodium) .... One By Mouth Before Breakfast On A Empty Stomach 7)  Cipro 500 Mg Tabs (Ciprofloxacin Hcl) .Marland Kitchen.. 1 By Mouth Two Times A Day  Allergies (verified): No Known Drug Allergies  Past History:  Past medical, surgical, family and social histories (including risk factors) reviewed for relevance to current acute and chronic problems.  Past Medical History: Reviewed history from 09/29/2009 and no changes required. PULMONARY  FIBROSIS and h/o SARCOIDOSIS   HYPERCHOLESTEROLEMIA   ASTHMA  8-09: Normal stress test Abnormal CT liver 09-2008: thyroid nodule?, ultrasound was negative  Past Surgical History: Reviewed history from 09/22/2008 and no changes required. surgery on rt lung (1960's)  Family History: Reviewed history from 09/22/2008 and no changes required. CAD - no colon Ca - no breast Ca - no ovarian/uterine Ca - no HTN - sisters x3  DM - sister x 3  high chol - siblings M - deceased liver Ca  F - deceased lung Ca  osteoporosis--no  Social History: Reviewed history from 09/29/2009 and no changes required. 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 Extremities:  No clubbing, cyanosis, edema, or deformity noted with normal full range of motion of all joints.   Psych:  Cognition and judgment appear intact. Alert and cooperative with normal attention span and concentration. No apparent delusions, illusions, hallucinations   Impression & Recommendations:  Problem # 1:  UTI (ICD-599.0)  Her updated medication list for this problem includes:    Cipro 500 Mg Tabs (Ciprofloxacin hcl) .Marland Kitchen... 1 by mouth two times a day  Orders: Specimen Handling (21308) T-Culture, Urine (65784-69629) UA Dipstick  w/o Micro (manual) (46962)  Encouraged to push clear liquids, get enough rest, and take acetaminophen as needed. To be seen in 10 days if no improvement, sooner if worse.  Complete Medication List: 1)  Proair Hfa 108 (90 Base) Mcg/act Aers (Albuterol sulfate) .Marland Kitchen.. 1-2 puffs every 4-6 hours as needed 2)  Baby Aspirin 81 Mg Chew (Aspirin) .Marland Kitchen.. 1 a  day 3)  Calcium 1000mg  A Day With Vit D 600 To 1000u A Day  4)  Fish Oil 1000 Mg Caps (Omega-3 fatty acids) 5)  Meloxicam 15 Mg Tabs (Meloxicam) .... One by mouth daily as needed for pain 6)  Pantoprazole Sodium 40 Mg Tbec (Pantoprazole sodium) .... One by mouth before breakfast on a empty stomach 7)  Cipro 500 Mg Tabs (Ciprofloxacin hcl) .Marland Kitchen.. 1 by mouth two times a day Prescriptions: CIPRO 500 MG TABS (CIPROFLOXACIN HCL) 1 by mouth two times a day  #10 x 0   Entered and Authorized by:   Loreen Freud DO   Signed by:   Loreen Freud DO on 05/04/2010   Method used:   Electronically to        [facility] #[id number]* (retail)       [address] [place], [place]  [address]       Ph: [phone]       Fax: [phone]   RxID:   [id number]    Orders Added: 1)  Specimen Handling [99000] 2)  T-Culture, Urine [[id number]] 3)  Est. Patient Level III [01093] 4)  UA Dipstick w/o Micro (manual) [81002]    Laboratory Results   Urine Tests   Date/Time Reported: May 04, 2010 3:41 PM  Routine Urinalysis   Color: yellow Appearance: Clear Glucose: negative   (Normal Range: Negative) Bilirubin: negative   (Normal Range: Negative) Ketone: negative   (Normal Range: Negative) Spec. Gravity: 1.020   (Normal Range: 1.003-1.035) Blood: negative   (Normal Range: Negative) pH: 5.0   (Normal Range: 5.0-8.0) Protein: negative   (Normal Range: Negative) Urobilinogen: 0.2   (Normal Range: 0-1) Nitrite: negative   (Normal Range: Negative) Leukocyte Esterace: trace   (Normal Range: Negative)

## 2010-05-12 ENCOUNTER — Encounter: Payer: Self-pay | Admitting: Family Medicine

## 2010-05-12 ENCOUNTER — Ambulatory Visit (INDEPENDENT_AMBULATORY_CARE_PROVIDER_SITE_OTHER): Payer: Medicare Other | Admitting: Family Medicine

## 2010-05-12 DIAGNOSIS — B3731 Acute candidiasis of vulva and vagina: Secondary | ICD-10-CM | POA: Insufficient documentation

## 2010-05-12 DIAGNOSIS — B373 Candidiasis of vulva and vagina: Secondary | ICD-10-CM | POA: Insufficient documentation

## 2010-05-16 NOTE — Assessment & Plan Note (Signed)
Summary: vaginal itching/cdj   Vital Signs:  Patient profile:   77 year old female Weight:      203.8 pounds Temp:     98.1 degrees F oral BP sitting:   138 / 88  (left arm) Cuff size:   large  Vitals Entered By: Almeta Monas CMA Duncan Dull) (May 12, 2010 2:48 PM) CC: c/o itching to the outside of the Vagina x a few days--Denies any discharge   History of Present Illness: Pt here c/o vaginal itch--ext only.  No dysuria, no d/c .  No odor.  [facility] thought it was [facility] because others had complained as well.   no other complaints.    Current Medications (verified): 1)  Proair Hfa 108 (90 Base) Mcg/act  Aers (Albuterol Sulfate) .Marland Kitchen.. 1-2 Puffs Every 4-6 Hours As Needed 2)  Baby Aspirin 81 Mg  Chew (Aspirin) .Marland Kitchen.. 1 A Day 3)  Calcium 1000mg  A Day With Vit D 600 To 1000u A Day 4)  Fish Oil 1000 Mg Caps (Omega-3 Fatty Acids) 5)  Meloxicam 15 Mg Tabs (Meloxicam) .... One By Mouth Daily As Needed For Pain 6)  Pantoprazole Sodium 40 Mg Tbec (Pantoprazole Sodium) .... One By Mouth Before Breakfast On A Empty Stomach 7)  Nystatin 100000 Unit/gm Crea (Nystatin) .... Apply Two Times A Day  Allergies (verified): No Known Drug Allergies  Past History:  Past Medical History: Last updated: 09/29/2009 PULMONARY FIBROSIS and h/o SARCOIDOSIS   HYPERCHOLESTEROLEMIA   ASTHMA  8-09: Normal stress test Abnormal CT liver 09-2008: thyroid nodule?, ultrasound was negative  Past Surgical History: Last updated: 09/22/2008 surgery on rt lung (1960's)  Family History: Last updated: 09/22/2008 CAD - no colon Ca - no breast Ca - no ovarian/uterine Ca - no HTN - sisters x3  DM - sister x 3  high chol - siblings M - deceased liver Ca  F - deceased lung Ca  osteoporosis--no  Social History: Last updated: 09/29/2009 Former Smoker ETOH--no  Single 4 children  Drug use-no Regular exercise-yes diet-- healthy  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 CAD - no colon Ca - no breast Ca - no ovarian/uterine Ca - no HTN - sisters x3  DM - sister x 3  high chol - siblings M - deceased liver Ca  F - deceased lung Ca  osteoporosis--no  Social History: Reviewed history from 09/29/2009 and no changes required. 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 Alert and cooperative with normal attention span and concentration. No apparent delusions, illusions, hallucinations   Impression & Recommendations:  Problem # 1:  CANDIDIASIS, VULVAR (ICD-112.1)  Her updated medication list for this problem includes:    Nystatin 100000 Unit/gm Crea (Nystatin) .Marland Kitchen... Apply two times a day  Discussed treatment regimen and preventive measures.   Complete Medication List: 1)  Proair Hfa 108 (90 Base) Mcg/act Aers (Albuterol sulfate) .Marland Kitchen.. 1-2 puffs every 4-6 hours as needed 2)  Baby Aspirin 81 Mg Chew (Aspirin) .Marland Kitchen.. 1 a day 3)  Calcium  1000mg  A Day With Vit D 600 To 1000u A Day  4)  Fish Oil 1000 Mg Caps (Omega-3 fatty acids) 5)  Meloxicam 15 Mg Tabs (Meloxicam) .... One by mouth daily as needed for pain 6)  Pantoprazole Sodium 40 Mg Tbec (Pantoprazole sodium) .... One by mouth before breakfast on a empty stomach 7)  Nystatin 100000 Unit/gm Crea (Nystatin) .... Apply two times a day Prescriptions: NYSTATIN 100000 UNIT/GM CREA (NYSTATIN) apply two times a day  #30g x 1   Entered and Authorized by:   Loreen Freud DO   Signed by:   Loreen Freud DO on  05/12/2010   Method used:   Electronically to        [id number]* (retail)       [address] [place], [place]  [address]       Ph: [phone]       Fax: [phone]   RxID:   [id number]    Orders Added: 1)  Est. Patient Level III [72536]

## 2010-08-01 NOTE — Assessment & Plan Note (Signed)
[facility]                             PULMONARY OFFICE NOTE   Felicia Martin, Felicia Martin                   MRN:          [id number]  DATE:07/24/2006                            DOB:          1932-12-06    CHIEF COMPLAINT:  Evaluate pneumonia and cough.   HISTORY OF PRESENT ILLNESS:  This is a 77 year old African-American  female who had a bout of pneumonia in February of 2008 and was seen at  [facility] for three different visits.  There was an infiltrate in the  right upper lobe; she had several rounds of antibiotics, at that time  she had a clearing of the throat that was deep.  She had shortness of  breath with activity.  She was short of breath mopping the floor and  cleaning her house but not at rest.  She had a dry cough. She had no  chest pain.  She had occasional wheezing at night.  She smoked less than  a half-pack a day for 10 years but quit smoking 5 years ago. She does  have a sister with pulmonary fibrosis who I actually see as a patient.  She has no hemoptysis, chest pain, irregular heart beats. There is no  acid reflux symptoms, no loss of appetite, change in weight, abdominal  pain, there is no difficulty swallowing, there is no sore throat. There  is no tooth or dental problems.   PAST HISTORY:   MEDICAL:  History of hypertension, hypercholesterolemia.   ALLERGIES:  Significant medical allergies are none.   OPERATIVE HISTORY:  None.   SOCIAL HISTORY:  Smoking history as noted above. She works at [employer]   FAMILY HISTORY:  Emphysema in a sister. Cancer in her mother.   REVIEW OF SYSTEMS:  Otherwise noncontributory.   CURRENT MEDICATIONS:  None except Pro Air p.r.n.   PHYSICAL EXAMINATION:  VITAL SIGNS:  Temperature was 98, blood pressure  138/80, pulse 87, saturation was 95% on room air.  CHEST:  Showed distant breath sounds with no wheeze or rhonchi.  CARDIAC EXAM:  Showed a regular rate and rhythm without S3, normal S1,  S2.  ABDOMEN:  Soft, nontender.  EXTREMITIES:  Showed no edema, clubbing or venous disease.  NEUROLOGIC EXAM:  Intact.  HEENT EXAM:  Clear.   IMPRESSION:  Asthmatic bronchitis and potential pulmonary fibrosis.   PLAN:  1. Obtain a CT scan of the chest.  2. A full set of pulmonary function studies will be obtained.  3. We will also check an ANA, angiotensin diverting enzyme assay,      fungal panel, immuno assay, hypersensitivity panel, IGE and RAST      SAB and P.  Rheumatoid factor and sed rate.   Will see the patient back in followup once these results are completed.     Charlcie Cradle Delford Field, MD, [facility]  Electronically Signed    PEW/MedQ  DD: 07/25/2006  DT: 07/25/2006  Job #: [id number]

## 2010-08-01 NOTE — Assessment & Plan Note (Signed)
[facility]                             PULMONARY OFFICE NOTE   MCKINNA, DEMARS                     MRN:          [id number]  DATE:11/20/2006                            DOB:          05-Dec-1932    Ms. Koelzer is a 77 year old, African-American female with a history of  asthmatic bronchitis, chronic airflow obstruction, mediastinal and hilar  granulomas, compatible sarcoidosis, mild pulmonary hypertension. Overall  she is doing relatively well on Asmanex 2 sprays daily and Foradil 1  spray twice daily. She did request a reeducation on the Asmanex usage.   PHYSICAL EXAMINATION:  VITAL SIGNS:  Temperature 97, blood pressure  126/70, pulse 75, saturation 94% on room air.  CHEST:  Showed to be clear without evidence of wheeze or rhonchi.  CARDIAC:  Showed a regular rate and rhythm without S3, normal S1, S2.  ABDOMEN:  Soft, nontender.  EXTREMITIES:  Showed no edema or clubbing.   IMPRESSION:  1. Asthmatic bronchitis.  2. Pulmonary fibrosis.  3. Stable airflow function.   PLAN:  Maintain Foradil 1 spray b.i.d., Asmanex 2 sprays daily. Return  to this office in 4 months for a recheck.     Charlcie Cradle Delford Field, MD, [facility]  Electronically Signed    PEW/MedQ  DD: 11/20/2006  DT: 11/21/2006  Job #: [id number]

## 2010-08-01 NOTE — Assessment & Plan Note (Signed)
[facility]                             PULMONARY OFFICE NOTE   SHONTELL, PROSSER                     MRN:          [id number]  DATE:08/21/2006                            DOB:          08-Sep-1932    Ms. Felicia Martin is a 77 year old African-American female here for followup.  We prescribed Advair at the last visit, but her insurance refuses to pay  for this.  Therefore, she only got samples.  She is using 250/50 one  spray twice daily.  She is having improvement with her breathing.  Having less cough.  Less chest congestion.  Her lab studies __________  65, rheumatoid __________  than 20.  BNP of 17.  IgE level 36.  Normal  RAST assay.  Her hypersensitivity profile was negative.  Her ACE level  was 34, ANA was negative.  Rheumatoid factor was negative.  A CT scan of  the chest was obtained and reviewed.  It showed calcified and non-  calcified mediastinal hilar nodes and pulmonary parenchymal scarring,  interstitial thickening, worse in the right upper lobe.  She also had  changes compatible with pulmonary arterial hypertension.  Pulmonary  functions have already been obtained and were reviewed today with the  patient.  This revealed an FEV1 of 1.62, FVC of 2.04.  Total lung  capacity of 84% predicted, diffusion capacity of 29% of predicted.   EXAM:  Temperature 97.8, blood pressure 118/70, pulse 78, saturation 95%  on room air.  CHEST:  Distant breath sounds with prolonged expiratory phase.  No  wheeze or rhonchi were noted.  CARDIAC:  Showed a regular rate and rhythm without S3.  Normal S1, S2.  ABDOMEN:  Soft.  Nontender.  EXTREMITIES:  No edema or clubbing.  SKIN:  Clear.   IMPRESSION:  1. Asthmatic bronchitis with chronic air flow obstruction.  2. Mediastinal and hilar granulomas compatible with sarcoidosis.  3. Pulmonary hypertension.   RECOMMENDATIONS:  Since her insurance will not cover Advair, we have  switched her to Foradil 1 capsule  twice daily and Asmanex 2 sprays  daily, and will see the patient back in return followup in 2 months.     Charlcie Cradle Delford Field, MD, [facility]  Electronically Signed    PEW/MedQ  DD: 08/21/2006  DT: 08/21/2006  Job #: [id number]

## 2010-09-04 ENCOUNTER — Other Ambulatory Visit: Payer: Self-pay | Admitting: Internal Medicine

## 2010-10-26 ENCOUNTER — Other Ambulatory Visit: Payer: Self-pay | Admitting: Internal Medicine

## 2010-12-04 ENCOUNTER — Other Ambulatory Visit: Payer: Self-pay | Admitting: Internal Medicine

## 2011-02-28 ENCOUNTER — Other Ambulatory Visit: Payer: Self-pay | Admitting: Family Medicine

## 2011-02-28 DIAGNOSIS — Z1231 Encounter for screening mammogram for malignant neoplasm of breast: Secondary | ICD-10-CM

## 2011-04-04 ENCOUNTER — Ambulatory Visit (HOSPITAL_COMMUNITY)
Admission: RE | Admit: 2011-04-04 | Discharge: 2011-04-04 | Disposition: A | Discharge status: Home / Self Care | Payer: Medicare Other | Source: Ambulatory Visit | Attending: Family Medicine | Admitting: Family Medicine

## 2011-04-04 DIAGNOSIS — Z1231 Encounter for screening mammogram for malignant neoplasm of breast: Secondary | ICD-10-CM

## 2011-05-25 ENCOUNTER — Ambulatory Visit (INDEPENDENT_AMBULATORY_CARE_PROVIDER_SITE_OTHER): Payer: Medicare Other | Admitting: Family Medicine

## 2011-05-25 ENCOUNTER — Encounter: Payer: Self-pay | Admitting: Family Medicine

## 2011-05-25 VITALS — BP 142/74 | HR 75 | Temp 98.4°F | Ht 60.0 in | Wt 204.4 lb

## 2011-05-25 DIAGNOSIS — E785 Hyperlipidemia, unspecified: Secondary | ICD-10-CM

## 2011-05-25 DIAGNOSIS — Z0000 Encounter for general adult medical examination without abnormal findings: Secondary | ICD-10-CM

## 2011-05-25 MED ORDER — ALBUTEROL SULFATE HFA 108 (90 BASE) MCG/ACT IN AERS: 2.0000 | INHALATION_SPRAY | Freq: Four times a day (QID) | RESPIRATORY_TRACT | Status: DC | PRN

## 2011-05-25 NOTE — Progress Notes (Signed)
Subjective:    Felicia Martin is a 78 y.o. female who presents for Medicare Annual/Subsequent preventive examination.  Preventive Screening-Counseling & Management  Tobacco History  Smoking status  . Former Smoker -- 0.5 packs/day for 40 years  . Types: Cigarettes  . Quit date: 05/25/1999  Smokeless tobacco  . Never Used     Problems Prior to Visit 1.   Current Problems (verified) Patient Active Problem List  Diagnoses  . SARCOIDOSIS  . HYPERCHOLESTEROLEMIA  . TELANGIECTASIA  . ASTHMA  . PULMONARY FIBROSIS  . DEGENERATIVE JOINT DISEASE  . OSTEOPENIA  . DYSPHAGIA UNSPECIFIED  . UTI  . CANDIDIASIS, VULVAR    Medications Prior to Visit Current Outpatient Prescriptions on File Prior to Visit  Medication Sig Dispense Refill  . meloxicam (MOBIC) 15 MG tablet TAKE 1 TABLET BY MOUTH EVERY DAY AS NEEDED FOR PAIN  30 tablet  3  . pantoprazole (PROTONIX) 40 MG tablet TAKE ONE TABLET BY MOUTH BEFORE BREAKFAST ON AN EMPTY STOMACH . **DUE FOR OFFICE VISIT**  30 tablet  3    Current Medications (verified) Current Outpatient Prescriptions  Medication Sig Dispense Refill  . albuterol (PROVENTIL HFA;VENTOLIN HFA) 108 (90 BASE) MCG/ACT inhaler Inhale 2 puffs into the lungs every 6 (six) hours as needed.      Marland Kitchen aspirin 81 MG tablet Take 81 mg by mouth daily.      . Calcium Carbonate-Vitamin D (CALCIUM-VITAMIN D) 500-200 MG-UNIT per tablet Take 1 tablet by mouth 2 (two) times daily with a meal.      . meloxicam (MOBIC) 15 MG tablet TAKE 1 TABLET BY MOUTH EVERY DAY AS NEEDED FOR PAIN  30 tablet  3  . pantoprazole (PROTONIX) 40 MG tablet TAKE ONE TABLET BY MOUTH BEFORE BREAKFAST ON AN EMPTY STOMACH . **DUE FOR OFFICE VISIT**  30 tablet  3     Allergies (verified) Review of patient's allergies indicates no known allergies.   PAST HISTORY  Family History Family History  Problem Relation Age of Onset  . Hypertension Sister     x's 3  . Diabetes Sister     x's 3  .  Hyperlipidemia    . Liver cancer Mother   . Liver cancer Father     Social History History  Substance Use Topics  . Smoking status: Former Smoker -- 0.5 packs/day for 40 years    Types: Cigarettes    Quit date: 05/25/1999  . Smokeless tobacco: Never Used  . Alcohol Use: No     Are there smokers in your home (other than you)? No  Risk Factors Current exercise habits: Y--3x a week  Dietary issues discussed: na   Cardiac risk factors: advanced age (older than 33 for men, 69 for women), dyslipidemia, obesity (BMI >= 30 kg/m2) and sedentary lifestyle.  Depression Screen (Note: if answer to either of the following is "Yes", a more complete depression screening is indicated)   Over the past two weeks, have you felt down, depressed or hopeless? No  Over the past two weeks, have you felt little interest or pleasure in doing things? No  Have you lost interest or pleasure in daily life? No  Do you often feel hopeless? No  Do you cry easily over simple problems? No  Activities of Daily Living In your present state of health, do you have any difficulty performing the following activities?:  Driving? No Managing money?  No Feeding yourself? No Getting from bed to chair? No Climbing a flight of stairs? No  Preparing food and eating?: No Bathing or showering? No Getting dressed: No Getting to the toilet? No Using the toilet:No Moving around from place to place: No In the past year have you fallen or had a near fall?:No   Are you sexually active?  No  Do you have more than one partner?  No  Hearing Difficulties: Yes Do you often ask people to speak up or repeat themselves? Yes Do you experience ringing or noises in your ears? No Do you have difficulty understanding soft or whispered voices? Yes   Do you feel that you have a problem with memory? No  Do you often misplace items? No  Do you feel safe at home?  Yes  Cognitive Testing  Alert? Yes  Normal Appearance?Yes  Oriented  to person? Yes  Place? Yes   Time? Yes  Recall of three objects?  Yes  Can perform simple calculations? Yes  Displays appropriate judgment?Yes  Can read the correct time from a watch face?Yes   Advanced Directives have been discussed with the patient? Yes  List the Names of Other Physician/Practitioners you currently use: 1.  opth- dr Darral Dash any recent Medical Services you may have received from other than [facility] providers in the past year (date may be approximate).  Immunization History  Administered Date(s) Administered  . Td 03/19/2002    Screening Tests Health Maintenance  Topic Date Due  . Influenza Vaccine  12/25/2011  . Tetanus/tdap  03/19/2012  . Colonoscopy  05/24/2021  . Pneumococcal Polysaccharide Vaccine Age 44 And Over  Addressed  . Zostavax  Addressed    All answers were reviewed with the patient and necessary referrals were made:  Loreen Freud, DO   05/25/2011   History reviewed: allergies, current medications, past family history, past medical history, past social history, past surgical history and problem list  Review of Systems  Review of Systems  Constitutional: Negative for activity change, appetite change and fatigue.  HENT: Negative for hearing loss, congestion, tinnitus and ear discharge.   Eyes: Negative for visual disturbance (see optho q1y -- vision corrected to 20/20 with glasses).  Respiratory: Negative for cough, chest tightness and shortness of breath.   Cardiovascular: Negative for chest pain, palpitations and leg swelling.  Gastrointestinal: Negative for abdominal pain, diarrhea, constipation and abdominal distention.  Genitourinary: Negative for urgency, frequency, decreased urine volume and difficulty urinating.  Musculoskeletal: Negative for back pain, arthralgias and gait problem.  Skin: Negative for color change, pallor and rash.  Neurological: Negative for dizziness, light-headedness, numbness and headaches.  Hematological:  Negative for adenopathy. Does not bruise/bleed easily.  Psychiatric/Behavioral: Negative for suicidal ideas, confusion, sleep disturbance, self-injury, dysphoric mood, decreased concentration and agitation.  Pt is able to read and write and can do all ADLs No risk for falling No abuse/ violence in home      Objective:     Vision by Snellen chart: opth Body mass index is 39.92 kg/(m^2). BP 142/74  Pulse 75  Temp(Src) 98.4 F (36.9 C) (Oral)  Ht 5' (1.524 m)  Wt 204 lb 6.4 oz (92.715 kg)  BMI 39.92 kg/m2  SpO2 94%  BP 142/74  Pulse 75  Temp(Src) 98.4 F (36.9 C) (Oral)  Ht 5' (1.524 m)  Wt 204 lb 6.4 oz (92.715 kg)  BMI 39.92 kg/m2  SpO2 94% General appearance: alert, cooperative, appears stated age and no distress Head: Normocephalic, without obvious abnormality, atraumatic Eyes: conjunctivae/corneas clear. PERRL, EOM's intact. Fundi benign. Ears: normal TM's and  external ear canals both ears Nose: Nares normal. Septum midline. Mucosa normal. No drainage or sinus tenderness. Throat: lips, mucosa, and tongue normal; teeth and gums normal Neck: no adenopathy, no carotid bruit, no JVD, supple, symmetrical, trachea midline and thyroid not enlarged, symmetric, no tenderness/mass/nodules Back: symmetric, no curvature. ROM normal. No CVA tenderness. Lungs: clear to auscultation bilaterally Breasts: normal appearance, no masses or tenderness Heart: regular rate and rhythm, S1, S2 normal, no murmur, click, rub or gallop Abdomen: soft, non-tender; bowel sounds normal; no masses,  no organomegaly Pelvic: not indicated; post-menopausal, no abnormal Pap smears in past Extremities: extremities normal, atraumatic, no cyanosis or edema Pulses: 2+ and symmetric Skin: Skin color, texture, turgor normal. No rashes or lesions Lymph nodes: Cervical, supraclavicular, and axillary nodes normal. Neurologic: Alert and oriented X 3, normal strength and tone. Normal symmetric reflexes. Normal  coordination and gait Psych--- no anxiety, no depression     Assessment:     cpe     Plan:     During the course of the visit the patient was educated and counseled about appropriate screening and preventive services including:    Pneumococcal vaccine   Influenza vaccine  Screening mammography  Bone densitometry screening  Colorectal cancer screening  Advanced directives: has an advanced directive - a copy has been provided  Diet review for nutrition referral? Yes ____  Not Indicated __x__   Patient Instructions (the written plan) was given to the patient.  Medicare Attestation I have personally reviewed: The patient's medical and social history Their use of alcohol, tobacco or illicit drugs Their current medications and supplements The patient's functional ability including ADLs,fall risks, home safety risks, cognitive, and hearing and visual impairment Diet and physical activities Evidence for depression or mood disorders  The patient's weight, height, BMI, and visual acuity have been recorded in the chart.  I have made referrals, counseling, and provided education to the patient based on review of the above and I have provided the patient with a written personalized care plan for preventive services.     Loreen Freud, DO   05/25/2011

## 2011-05-25 NOTE — Patient Instructions (Signed)
Preventive Care for Adults, Female A healthy lifestyle and preventive care can promote health and wellness. Preventive health guidelines for women include the following key practices.  A routine yearly physical is a good way to check with your caregiver about your health and preventive screening. It is a chance to share any concerns and updates on your health, and to receive a thorough exam.   Visit your dentist for a routine exam and preventive care every 6 months. Brush your teeth twice a day and floss once a day. Good oral hygiene prevents tooth decay and gum disease.   The frequency of eye exams is based on your age, health, family medical history, use of contact lenses, and other factors. Follow your caregiver's recommendations for frequency of eye exams.   Eat a healthy diet. Foods like vegetables, fruits, whole grains, low-fat dairy products, and lean protein foods contain the nutrients you need without too many calories. Decrease your intake of foods high in solid fats, added sugars, and salt. Eat the right amount of calories for you.Get information about a proper diet from your caregiver, if necessary.   Regular physical exercise is one of the most important things you can do for your health. Most adults should get at least 150 minutes of moderate-intensity exercise (any activity that increases your heart rate and causes you to sweat) each week. In addition, most adults need muscle-strengthening exercises on 2 or more days a week.   Maintain a healthy weight. The body mass index (BMI) is a screening tool to identify possible weight problems. It provides an estimate of body fat based on height and weight. Your caregiver can help determine your BMI, and can help you achieve or maintain a healthy weight.For adults 20 years and older:   A BMI below 18.5 is considered underweight.   A BMI of 18.5 to 24.9 is normal.   A BMI of 25 to 29.9 is considered overweight.   A BMI of 30 and above is  considered obese.   Maintain normal blood lipids and cholesterol levels by exercising and minimizing your intake of saturated fat. Eat a balanced diet with plenty of fruit and vegetables. Blood tests for lipids and cholesterol should begin at age 20 and be repeated every 5 years. If your lipid or cholesterol levels are high, you are over 50, or you are at high risk for heart disease, you may need your cholesterol levels checked more frequently.Ongoing high lipid and cholesterol levels should be treated with medicines if diet and exercise are not effective.   If you smoke, find out from your caregiver how to quit. If you do not use tobacco, do not start.   If you are pregnant, do not drink alcohol. If you are breastfeeding, be very cautious about drinking alcohol. If you are not pregnant and choose to drink alcohol, do not exceed 1 drink per day. One drink is considered to be 12 ounces (355 mL) of beer, 5 ounces (148 mL) of wine, or 1.5 ounces (44 mL) of liquor.   Avoid use of street drugs. Do not share needles with anyone. Ask for help if you need support or instructions about stopping the use of drugs.   High blood pressure causes heart disease and increases the risk of stroke. Your blood pressure should be checked at least every 1 to 2 years. Ongoing high blood pressure should be treated with medicines if weight loss and exercise are not effective.   If you are 55 to 76   years old, ask your caregiver if you should take aspirin to prevent strokes.   Diabetes screening involves taking a blood sample to check your fasting blood sugar level. This should be done once every 3 years, after age 45, if you are within normal weight and without risk factors for diabetes. Testing should be considered at a younger age or be carried out more frequently if you are overweight and have at least 1 risk factor for diabetes.   Breast cancer screening is essential preventive care for women. You should practice "breast  self-awareness." This means understanding the normal appearance and feel of your breasts and may include breast self-examination. Any changes detected, no matter how small, should be reported to a caregiver. Women in their 20s and 30s should have a clinical breast exam (CBE) by a caregiver as part of a regular health exam every 1 to 3 years. After age 40, women should have a CBE every year. Starting at age 40, women should consider having a mammography (breast X-ray test) every year. Women who have a family history of breast cancer should talk to their caregiver about genetic screening. Women at a high risk of breast cancer should talk to their caregivers about having magnetic resonance imaging (MRI) and a mammography every year.   The Pap test is a screening test for cervical cancer. A Pap test can show cell changes on the cervix that might become cervical cancer if left untreated. A Pap test is a procedure in which cells are obtained and examined from the lower end of the uterus (cervix).   Women should have a Pap test starting at age 21.   Between ages 21 and 29, Pap tests should be repeated every 2 years.   Beginning at age 30, you should have a Pap test every 3 years as long as the past 3 Pap tests have been normal.   Some women have medical problems that increase the chance of getting cervical cancer. Talk to your caregiver about these problems. It is especially important to talk to your caregiver if a new problem develops soon after your last Pap test. In these cases, your caregiver may recommend more frequent screening and Pap tests.   The above recommendations are the same for women who have or have not gotten the vaccine for human papillomavirus (HPV).   If you had a hysterectomy for a problem that was not cancer or a condition that could lead to cancer, then you no longer need Pap tests. Even if you no longer need a Pap test, a regular exam is a good idea to make sure no other problems are  starting.   If you are between ages 65 and 70, and you have had normal Pap tests going back 10 years, you no longer need Pap tests. Even if you no longer need a Pap test, a regular exam is a good idea to make sure no other problems are starting.   If you have had past treatment for cervical cancer or a condition that could lead to cancer, you need Pap tests and screening for cancer for at least 20 years after your treatment.   If Pap tests have been discontinued, risk factors (such as a new sexual partner) need to be reassessed to determine if screening should be resumed.   The HPV test is an additional test that may be used for cervical cancer screening. The HPV test looks for the virus that can cause the cell changes on the cervix.   The cells collected during the Pap test can be tested for HPV. The HPV test could be used to screen women aged 30 years and older, and should be used in women of any age who have unclear Pap test results. After the age of 30, women should have HPV testing at the same frequency as a Pap test.   Colorectal cancer can be detected and often prevented. Most routine colorectal cancer screening begins at the age of 50 and continues through age 75. However, your caregiver may recommend screening at an earlier age if you have risk factors for colon cancer. On a yearly basis, your caregiver may provide home test kits to check for hidden blood in the stool. Use of a small camera at the end of a tube, to directly examine the colon (sigmoidoscopy or colonoscopy), can detect the earliest forms of colorectal cancer. Talk to your caregiver about this at age 50, when routine screening begins. Direct examination of the colon should be repeated every 5 to 10 years through age 75, unless early forms of pre-cancerous polyps or small growths are found.   Hepatitis C blood testing is recommended for all people born from 1945 through 1965 and any individual with known risks for hepatitis C.    Practice safe sex. Use condoms and avoid high-risk sexual practices to reduce the spread of sexually transmitted infections (STIs). STIs include gonorrhea, chlamydia, syphilis, trichomonas, herpes, HPV, and human immunodeficiency virus (HIV). Herpes, HIV, and HPV are viral illnesses that have no cure. They can result in disability, cancer, and death. Sexually active women aged 25 and younger should be checked for chlamydia. Older women with new or multiple partners should also be tested for chlamydia. Testing for other STIs is recommended if you are sexually active and at increased risk.   Osteoporosis is a disease in which the bones lose minerals and strength with aging. This can result in serious bone fractures. The risk of osteoporosis can be identified using a bone density scan. Women ages 65 and over and women at risk for fractures or osteoporosis should discuss screening with their caregivers. Ask your caregiver whether you should take a calcium supplement or vitamin D to reduce the rate of osteoporosis.   Menopause can be associated with physical symptoms and risks. Hormone replacement therapy is available to decrease symptoms and risks. You should talk to your caregiver about whether hormone replacement therapy is right for you.   Use sunscreen with sun protection factor (SPF) of 30 or more. Apply sunscreen liberally and repeatedly throughout the day. You should seek shade when your shadow is shorter than you. Protect yourself by wearing long sleeves, pants, a wide-brimmed hat, and sunglasses year round, whenever you are outdoors.   Once a month, do a whole body skin exam, using a mirror to look at the skin on your back. Notify your caregiver of new moles, moles that have irregular borders, moles that are larger than a pencil eraser, or moles that have changed in shape or color.   Stay current with required immunizations.   Influenza. You need a dose every fall (or winter). The composition of  the flu vaccine changes each year, so being vaccinated once is not enough.   Pneumococcal polysaccharide. You need 1 to 2 doses if you smoke cigarettes or if you have certain chronic medical conditions. You need 1 dose at age 65 (or older) if you have never been vaccinated.   Tetanus, diphtheria, pertussis (Tdap, Td). Get 1 dose of   Tdap vaccine if you are younger than age 65, are over 65 and have contact with an infant, are a healthcare worker, are pregnant, or simply want to be protected from whooping cough. After that, you need a Td booster dose every 10 years. Consult your caregiver if you have not had at least 3 tetanus and diphtheria-containing shots sometime in your life or have a deep or dirty wound.   HPV. You need this vaccine if you are a woman age 26 or younger. The vaccine is given in 3 doses over 6 months.   Measles, mumps, rubella (MMR). You need at least 1 dose of MMR if you were born in 1957 or later. You may also need a second dose.   Meningococcal. If you are age 19 to 21 and a first-year college student living in a residence hall, or have one of several medical conditions, you need to get vaccinated against meningococcal disease. You may also need additional booster doses.   Zoster (shingles). If you are age 60 or older, you should get this vaccine.   Varicella (chickenpox). If you have never had chickenpox or you were vaccinated but received only 1 dose, talk to your caregiver to find out if you need this vaccine.   Hepatitis A. You need this vaccine if you have a specific risk factor for hepatitis A virus infection or you simply wish to be protected from this disease. The vaccine is usually given as 2 doses, 6 to 18 months apart.   Hepatitis B. You need this vaccine if you have a specific risk factor for hepatitis B virus infection or you simply wish to be protected from this disease. The vaccine is given in 3 doses, usually over 6 months.  Preventive Services /  Frequency Ages 19 to 39  Blood pressure check.** / Every 1 to 2 years.   Lipid and cholesterol check.** / Every 5 years beginning at age 20.   Clinical breast exam.** / Every 3 years for women in their 20s and 30s.   Pap test.** / Every 2 years from ages 21 through 29. Every 3 years starting at age 30 through age 65 or 70 with a history of 3 consecutive normal Pap tests.   HPV screening.** / Every 3 years from ages 30 through ages 65 to 70 with a history of 3 consecutive normal Pap tests.   Hepatitis C blood test.** / For any individual with known risks for hepatitis C.   Skin self-exam. / Monthly.   Influenza immunization.** / Every year.   Pneumococcal polysaccharide immunization.** / 1 to 2 doses if you smoke cigarettes or if you have certain chronic medical conditions.   Tetanus, diphtheria, pertussis (Tdap, Td) immunization. / A one-time dose of Tdap vaccine. After that, you need a Td booster dose every 10 years.   HPV immunization. / 3 doses over 6 months, if you are 26 and younger.   Measles, mumps, rubella (MMR) immunization. / You need at least 1 dose of MMR if you were born in 1957 or later. You may also need a second dose.   Meningococcal immunization. / 1 dose if you are age 19 to 21 and a first-year college student living in a residence hall, or have one of several medical conditions, you need to get vaccinated against meningococcal disease. You may also need additional booster doses.   Varicella immunization.** / Consult your caregiver.   Hepatitis A immunization.** / Consult your caregiver. 2 doses, 6 to 18 months   apart.   Hepatitis B immunization.** / Consult your caregiver. 3 doses usually over 6 months.  Ages 40 to 64  Blood pressure check.** / Every 1 to 2 years.   Lipid and cholesterol check.** / Every 5 years beginning at age 20.   Clinical breast exam.** / Every year after age 40.   Mammogram.** / Every year beginning at age 40 and continuing for as  long as you are in good health. Consult with your caregiver.   Pap test.** / Every 3 years starting at age 30 through age 65 or 70 with a history of 3 consecutive normal Pap tests.   HPV screening.** / Every 3 years from ages 30 through ages 65 to 70 with a history of 3 consecutive normal Pap tests.   Fecal occult blood test (FOBT) of stool. / Every year beginning at age 50 and continuing until age 75. You may not need to do this test if you get a colonoscopy every 10 years.   Flexible sigmoidoscopy or colonoscopy.** / Every 5 years for a flexible sigmoidoscopy or every 10 years for a colonoscopy beginning at age 50 and continuing until age 75.   Hepatitis C blood test.** / For all people born from 1945 through 1965 and any individual with known risks for hepatitis C.   Skin self-exam. / Monthly.   Influenza immunization.** / Every year.   Pneumococcal polysaccharide immunization.** / 1 to 2 doses if you smoke cigarettes or if you have certain chronic medical conditions.   Tetanus, diphtheria, pertussis (Tdap, Td) immunization.** / A one-time dose of Tdap vaccine. After that, you need a Td booster dose every 10 years.   Measles, mumps, rubella (MMR) immunization. / You need at least 1 dose of MMR if you were born in 1957 or later. You may also need a second dose.   Varicella immunization.** / Consult your caregiver.   Meningococcal immunization.** / Consult your caregiver.   Hepatitis A immunization.** / Consult your caregiver. 2 doses, 6 to 18 months apart.   Hepatitis B immunization.** / Consult your caregiver. 3 doses, usually over 6 months.  Ages 65 and over  Blood pressure check.** / Every 1 to 2 years.   Lipid and cholesterol check.** / Every 5 years beginning at age 20.   Clinical breast exam.** / Every year after age 40.   Mammogram.** / Every year beginning at age 40 and continuing for as long as you are in good health. Consult with your caregiver.   Pap test.** /  Every 3 years starting at age 30 through age 65 or 70 with a 3 consecutive normal Pap tests. Testing can be stopped between 65 and 70 with 3 consecutive normal Pap tests and no abnormal Pap or HPV tests in the past 10 years.   HPV screening.** / Every 3 years from ages 30 through ages 65 or 70 with a history of 3 consecutive normal Pap tests. Testing can be stopped between 65 and 70 with 3 consecutive normal Pap tests and no abnormal Pap or HPV tests in the past 10 years.   Fecal occult blood test (FOBT) of stool. / Every year beginning at age 50 and continuing until age 75. You may not need to do this test if you get a colonoscopy every 10 years.   Flexible sigmoidoscopy or colonoscopy.** / Every 5 years for a flexible sigmoidoscopy or every 10 years for a colonoscopy beginning at age 50 and continuing until age 75.   Hepatitis   C blood test.** / For all people born from 1945 through 1965 and any individual with known risks for hepatitis C.   Osteoporosis screening.** / A one-time screening for women ages 65 and over and women at risk for fractures or osteoporosis.   Skin self-exam. / Monthly.   Influenza immunization.** / Every year.   Pneumococcal polysaccharide immunization.** / 1 dose at age 65 (or older) if you have never been vaccinated.   Tetanus, diphtheria, pertussis (Tdap, Td) immunization. / A one-time dose of Tdap vaccine if you are over 65 and have contact with an infant, are a healthcare worker, or simply want to be protected from whooping cough. After that, you need a Td booster dose every 10 years.   Varicella immunization.** / Consult your caregiver.   Meningococcal immunization.** / Consult your caregiver.   Hepatitis A immunization.** / Consult your caregiver. 2 doses, 6 to 18 months apart.   Hepatitis B immunization.** / Check with your caregiver. 3 doses, usually over 6 months.  ** Family history and personal history of risk and conditions may change your caregiver's  recommendations. Document Released: 05/01/2001 Document Revised: 02/22/2011 Document Reviewed: 07/31/2010 [employer] Patient Information 2012 [employer]. 

## 2011-05-28 ENCOUNTER — Other Ambulatory Visit (INDEPENDENT_AMBULATORY_CARE_PROVIDER_SITE_OTHER): Payer: Medicare Other

## 2011-05-28 DIAGNOSIS — E785 Hyperlipidemia, unspecified: Secondary | ICD-10-CM

## 2011-05-28 LAB — BASIC METABOLIC PANEL
CO2: 25 mEq/L (ref 19–32)
Calcium: 9.5 mg/dL (ref 8.4–10.5)
Chloride: 106 mEq/L (ref 96–112)
Creatinine, Ser: 0.9 mg/dL (ref 0.4–1.2)
Glucose, Bld: 99 mg/dL (ref 70–99)
Sodium: 139 mEq/L (ref 135–145)

## 2011-05-28 LAB — HEPATIC FUNCTION PANEL
AST: 20 U/L (ref 0–37)
Albumin: 4 g/dL (ref 3.5–5.2)
Alkaline Phosphatase: 77 U/L (ref 39–117)
Total Bilirubin: 0.3 mg/dL (ref 0.3–1.2)

## 2011-05-28 LAB — CBC WITH DIFFERENTIAL/PLATELET
Basophils Absolute: 0 10*3/uL (ref 0.0–0.1)
Basophils Relative: 0.9 % (ref 0.0–3.0)
Eosinophils Absolute: 0.2 10*3/uL (ref 0.0–0.7)
Hemoglobin: 12 g/dL (ref 12.0–15.0)
MCHC: 33.4 g/dL (ref 30.0–36.0)
MCV: 92 fl (ref 78.0–100.0)
Monocytes Absolute: 0.4 10*3/uL (ref 0.1–1.0)
Neutro Abs: 2.3 10*3/uL (ref 1.4–7.7)
RBC: 3.9 Mil/uL (ref 3.87–5.11)
RDW: 12.9 % (ref 11.5–14.6)

## 2011-05-28 LAB — LIPID PANEL
Cholesterol: 292 mg/dL — ABNORMAL HIGH (ref 0–200)
Total CHOL/HDL Ratio: 5
VLDL: 20 mg/dL (ref 0.0–40.0)

## 2011-06-01 ENCOUNTER — Ambulatory Visit: Payer: Medicare Other

## 2011-06-01 DIAGNOSIS — Z1211 Encounter for screening for malignant neoplasm of colon: Secondary | ICD-10-CM

## 2011-06-01 DIAGNOSIS — Z0000 Encounter for general adult medical examination without abnormal findings: Secondary | ICD-10-CM

## 2011-06-04 MED ORDER — ATORVASTATIN CALCIUM 20 MG PO TABS: 20.0000 mg | ORAL_TABLET | Freq: Every day | ORAL | Status: AC

## 2011-06-18 ENCOUNTER — Other Ambulatory Visit: Payer: Self-pay | Admitting: Internal Medicine

## 2011-06-18 NOTE — Telephone Encounter (Signed)
Done

## 2011-06-19 ENCOUNTER — Other Ambulatory Visit: Payer: Self-pay | Admitting: Family Medicine

## 2011-10-24 ENCOUNTER — Other Ambulatory Visit (INDEPENDENT_AMBULATORY_CARE_PROVIDER_SITE_OTHER): Payer: Medicare Other

## 2011-10-24 DIAGNOSIS — E785 Hyperlipidemia, unspecified: Secondary | ICD-10-CM

## 2011-10-24 LAB — LIPID PANEL
Cholesterol: 280 mg/dL — ABNORMAL HIGH (ref 0–200)
HDL: 53.5 mg/dL (ref >39.00)
Total CHOL/HDL Ratio: 5
Triglycerides: 134 mg/dL (ref 0.0–149.0)
VLDL: 26.8 mg/dL (ref 0.0–40.0)

## 2011-10-24 LAB — HEPATIC FUNCTION PANEL
ALT: 16 U/L (ref 0–35)
AST: 21 U/L (ref 0–37)
Albumin: 3.6 g/dL (ref 3.5–5.2)
Alkaline Phosphatase: 72 U/L (ref 39–117)
Bilirubin, Direct: 0 mg/dL (ref 0.0–0.3)
Total Bilirubin: 0.5 mg/dL (ref 0.3–1.2)
Total Protein: 7.9 g/dL (ref 6.0–8.3)

## 2011-10-24 LAB — LDL CHOLESTEROL, DIRECT: Direct LDL: 200.2 mg/dL

## 2011-10-24 NOTE — Progress Notes (Signed)
Labs only

## 2011-11-25 ENCOUNTER — Other Ambulatory Visit: Payer: Self-pay | Admitting: Internal Medicine

## 2012-04-24 ENCOUNTER — Other Ambulatory Visit: Payer: Self-pay | Admitting: Family Medicine

## 2012-04-24 DIAGNOSIS — Z1231 Encounter for screening mammogram for malignant neoplasm of breast: Secondary | ICD-10-CM

## 2012-05-02 ENCOUNTER — Ambulatory Visit (HOSPITAL_COMMUNITY): Payer: Medicare Other

## 2012-05-12 ENCOUNTER — Ambulatory Visit (HOSPITAL_COMMUNITY)
Admission: RE | Admit: 2012-05-12 | Discharge: 2012-05-12 | Disposition: A | Discharge status: Home / Self Care | Payer: Medicare Other | Source: Ambulatory Visit | Attending: Family Medicine | Admitting: Family Medicine

## 2012-05-12 DIAGNOSIS — Z1231 Encounter for screening mammogram for malignant neoplasm of breast: Secondary | ICD-10-CM | POA: Insufficient documentation

## 2012-07-11 ENCOUNTER — Other Ambulatory Visit: Payer: Self-pay | Admitting: Family Medicine

## 2012-08-06 ENCOUNTER — Ambulatory Visit (INDEPENDENT_AMBULATORY_CARE_PROVIDER_SITE_OTHER): Payer: Medicare Other | Admitting: Family Medicine

## 2012-08-06 ENCOUNTER — Encounter: Payer: Self-pay | Admitting: Family Medicine

## 2012-08-06 VITALS — BP 140/80 | HR 72 | Temp 98.1°F | Ht 61.0 in | Wt 208.2 lb

## 2012-08-06 DIAGNOSIS — Z0000 Encounter for general adult medical examination without abnormal findings: Secondary | ICD-10-CM

## 2012-08-06 DIAGNOSIS — J45909 Unspecified asthma, uncomplicated: Secondary | ICD-10-CM

## 2012-08-06 DIAGNOSIS — R319 Hematuria, unspecified: Secondary | ICD-10-CM

## 2012-08-06 DIAGNOSIS — K219 Gastro-esophageal reflux disease without esophagitis: Secondary | ICD-10-CM

## 2012-08-06 DIAGNOSIS — N390 Urinary tract infection, site not specified: Secondary | ICD-10-CM

## 2012-08-06 DIAGNOSIS — M545 Low back pain: Secondary | ICD-10-CM

## 2012-08-06 DIAGNOSIS — E7800 Pure hypercholesterolemia, unspecified: Secondary | ICD-10-CM

## 2012-08-06 LAB — HEPATIC FUNCTION PANEL
ALT: 12 U/L (ref 0–35)
AST: 17 U/L (ref 0–37)
Total Bilirubin: 0.5 mg/dL (ref 0.3–1.2)
Total Protein: 8.6 g/dL — ABNORMAL HIGH (ref 6.0–8.3)

## 2012-08-06 LAB — POCT URINALYSIS DIPSTICK
Bilirubin, UA: NEGATIVE
Glucose, UA: NEGATIVE
Ketones, UA: NEGATIVE
Nitrite, UA: POSITIVE

## 2012-08-06 LAB — CBC WITH DIFFERENTIAL/PLATELET
Basophils Relative: 0.8 % (ref 0.0–3.0)
Eosinophils Relative: 6.6 % — ABNORMAL HIGH (ref 0.0–5.0)
HCT: 37.2 % (ref 36.0–46.0)
Hemoglobin: 12.5 g/dL (ref 12.0–15.0)
Lymphs Abs: 1.6 10*3/uL (ref 0.7–4.0)
MCV: 90.5 fl (ref 78.0–100.0)
Monocytes Absolute: 0.6 10*3/uL (ref 0.1–1.0)
Monocytes Relative: 11.5 % (ref 3.0–12.0)
Neutro Abs: 2.7 10*3/uL (ref 1.4–7.7)
Platelets: 326 10*3/uL (ref 150.0–400.0)
RBC: 4.11 Mil/uL (ref 3.87–5.11)
WBC: 5.3 10*3/uL (ref 4.5–10.5)

## 2012-08-06 LAB — BASIC METABOLIC PANEL
Chloride: 103 mEq/L (ref 96–112)
GFR: 77.97 mL/min (ref >60.00)
Potassium: 4.3 mEq/L (ref 3.5–5.1)
Sodium: 137 mEq/L (ref 135–145)

## 2012-08-06 LAB — LIPID PANEL
Cholesterol: 281 mg/dL — ABNORMAL HIGH (ref 0–200)
Total CHOL/HDL Ratio: 6

## 2012-08-06 MED ORDER — BECLOMETHASONE DIPROPIONATE 40 MCG/ACT IN AERS: 2.0000 | INHALATION_SPRAY | Freq: Two times a day (BID) | RESPIRATORY_TRACT | Status: DC

## 2012-08-06 NOTE — Progress Notes (Signed)
Subjective:    Felicia Martin is a 79 y.o. female who presents for Medicare Annual/Subsequent preventive examination.  Preventive Screening-Counseling & Management  Tobacco History  Smoking status  . Former Smoker -- 0.50 packs/day for 40 years  . Types: Cigarettes  . Quit date: 05/25/1999  Smokeless tobacco  . Never Used     Problems Prior to Visit 1.   Current Problems (verified) Patient Active Problem List   Diagnosis Date Noted  . CANDIDIASIS, VULVAR 05/12/2010  . UTI 05/04/2010  . DEGENERATIVE JOINT DISEASE 09/22/2008  . DYSPHAGIA UNSPECIFIED 09/22/2008  . OSTEOPENIA 11/05/2007  . TELANGIECTASIA 05/29/2007  . HYPERCHOLESTEROLEMIA 02/01/2007  . PULMONARY FIBROSIS 02/01/2007  . SARCOIDOSIS 11/20/2006  . ASTHMA 11/20/2006    Medications Prior to Visit Current Outpatient Prescriptions on File Prior to Visit  Medication Sig Dispense Refill  . albuterol (PROVENTIL HFA;VENTOLIN HFA) 108 (90 BASE) MCG/ACT inhaler Inhale 2 puffs into the lungs every 6 (six) hours as needed.  1 Inhaler  1  . aspirin 81 MG tablet Take 81 mg by mouth daily.      . Calcium Carbonate-Vitamin D (CALCIUM-VITAMIN D) 500-200 MG-UNIT per tablet Take 1 tablet by mouth 2 (two) times daily with a meal.      . meloxicam (MOBIC) 15 MG tablet TAKE 1 TABLET BY MOUTH EVERY DAY AS NEEDED FOR PAIN  30 tablet  2  . pantoprazole (PROTONIX) 40 MG tablet TAKE 1 TABLET BY MOUTH EVERY DAY  30 tablet  0   No current facility-administered medications on file prior to visit.    Current Medications (verified) Current Outpatient Prescriptions  Medication Sig Dispense Refill  . albuterol (PROVENTIL HFA;VENTOLIN HFA) 108 (90 BASE) MCG/ACT inhaler Inhale 2 puffs into the lungs every 6 (six) hours as needed.  1 Inhaler  1  . aspirin 81 MG tablet Take 81 mg by mouth daily.      . Calcium Carbonate-Vitamin D (CALCIUM-VITAMIN D) 500-200 MG-UNIT per tablet Take 1 tablet by mouth 2 (two) times daily with a meal.      .  meloxicam (MOBIC) 15 MG tablet TAKE 1 TABLET BY MOUTH EVERY DAY AS NEEDED FOR PAIN  30 tablet  2  . pantoprazole (PROTONIX) 40 MG tablet TAKE 1 TABLET BY MOUTH EVERY DAY  30 tablet  0   No current facility-administered medications for this visit.     Allergies (verified) Review of patient's allergies indicates no known allergies.   PAST HISTORY  Family History Family History  Problem Relation Age of Onset  . Hypertension Sister     x's 3  . Diabetes Sister     x's 3  . Hyperlipidemia    . Liver cancer Mother   . Liver cancer Father     Social History History  Substance Use Topics  . Smoking status: Former Smoker -- 0.50 packs/day for 40 years    Types: Cigarettes    Quit date: 05/25/1999  . Smokeless tobacco: Never Used  . Alcohol Use: No     Are there smokers in your home (other than you)? No  Risk Factors Current exercise habits: walking  Dietary issues discussed: na   Cardiac risk factors: advanced age (older than 49 for men, 66 for women) and obesity (BMI >= 30 kg/m2).  Depression Screen (Note: if answer to either of the following is "Yes", a more complete depression screening is indicated)   Over the past two weeks, have you felt down, depressed or hopeless? No  Over the past  two weeks, have you felt little interest or pleasure in doing things? No  Have you lost interest or pleasure in daily life? No  Do you often feel hopeless? No  Do you cry easily over simple problems? No  Activities of Daily Living In your present state of health, do you have any difficulty performing the following activities?:  Driving? No Managing money?  No Feeding yourself? No Getting from bed to chair? No Climbing a flight of stairs? No Preparing food and eating?: No Bathing or showering? No Getting dressed: No Getting to the toilet? No Using the toilet:No Moving around from place to place: No In the past year have you fallen or had a near fall?:No   Are you sexually  active?  No  Do you have more than one partner?  No  Hearing Difficulties: Yes---pt has hearing aids Do you often ask people to speak up or repeat themselves? Yes Do you experience ringing or noises in your ears? No Do you have difficulty understanding soft or whispered voices? Yes   Do you feel that you have a problem with memory? No  Do you often misplace items? No  Do you feel safe at home?  No  Cognitive Testing  Alert? Yes  Normal Appearance?Yes  Oriented to person? Yes  Place? Yes   Time? Yes  Recall of three objects?  Yes  Can perform simple calculations? Yes  Displays appropriate judgment?Yes  Can read the correct time from a watch face?Yes   Advanced Directives have been discussed with the patient? Yes  List the Names of Other Physician/Practitioners you currently use: 1.  opth--? Cant remember name   Indicate any recent Medical Services you may have received from other than [facility] providers in the past year (date may be approximate).  Immunization History  Administered Date(s) Administered  . Td 03/19/2002    Screening Tests Health Maintenance  Topic Date Due  . Tetanus/tdap  03/19/2012  . Influenza Vaccine  11/17/2012  . Colonoscopy  05/24/2021  . Pneumococcal Polysaccharide Vaccine Age 74 And Over  Addressed  . Zostavax  Addressed    All answers were reviewed with the patient and necessary referrals were made:  Loreen Freud, DO   08/06/2012   History reviewed:  She  has a past medical history of Pulmonary fibrosis; Sarcoidosis; Asthma; and Hypercholesterolemia. She  does not have any pertinent problems on file. She  has past surgical history that includes Lung surgery (1960). Her family history includes Diabetes in her sister; Hyperlipidemia in an unspecified family member; Hypertension in her sister; and Liver cancer in her father and mother. She  reports that she quit smoking about 13 years ago. Her smoking use included Cigarettes. She has a 20  pack-year smoking history. She has never used smokeless tobacco. She reports that she does not drink alcohol or use illicit drugs. She has a current medication list which includes the following prescription(s): albuterol, aspirin, calcium-vitamin d, meloxicam, and pantoprazole. Current Outpatient Prescriptions on File Prior to Visit  Medication Sig Dispense Refill  . albuterol (PROVENTIL HFA;VENTOLIN HFA) 108 (90 BASE) MCG/ACT inhaler Inhale 2 puffs into the lungs every 6 (six) hours as needed.  1 Inhaler  1  . aspirin 81 MG tablet Take 81 mg by mouth daily.      . Calcium Carbonate-Vitamin D (CALCIUM-VITAMIN D) 500-200 MG-UNIT per tablet Take 1 tablet by mouth 2 (two) times daily with a meal.      . meloxicam (MOBIC) 15 MG tablet  TAKE 1 TABLET BY MOUTH EVERY DAY AS NEEDED FOR PAIN  30 tablet  2  . pantoprazole (PROTONIX) 40 MG tablet TAKE 1 TABLET BY MOUTH EVERY DAY  30 tablet  0   No current facility-administered medications on file prior to visit.   She has No Known Allergies.  Review of Systems  Review of Systems  Constitutional: Negative for activity change, appetite change and fatigue.  HENT: Negative congestion, tinnitus and ear discharge.  + hearing aids Eyes: Negative for visual disturbance (see optho q1y -- vision corrected to 20/20 with glasses).  Respiratory: Negative for cough, chest tightness and shortness of breath.   Cardiovascular: Negative for chest pain, palpitations and leg swelling.  Gastrointestinal: Negative for abdominal pain, diarrhea, constipation and abdominal distention.  Genitourinary: Negative for urgency, frequency, decreased urine volume and difficulty urinating.  Musculoskeletal: Negative for back pain, arthralgias and gait problem.  Skin: Negative for color change, pallor and rash.  Neurological: Negative for dizziness, light-headedness, numbness and headaches.  Hematological: Negative for adenopathy. Does not bruise/bleed easily.  Psychiatric/Behavioral:  Negative for suicidal ideas, confusion, sleep disturbance, self-injury, dysphoric mood, decreased concentration and agitation.  Pt is able to read and write and can do all ADLs No risk for falling No abuse/ violence in home      Objective:     Vision by Snellen chart: right ZOX:WRUE, left AVW:[id number]  Body mass index is 39.36 kg/(m^2). BP 140/80  Pulse 72  Temp(Src) 98.1 F (36.7 C) (Oral)  Ht 5\' 1"  (1.549 m)  Wt 208 lb 3.2 oz (94.439 kg)  BMI 39.36 kg/m2  SpO2 97%  BP 140/80  Pulse 72  Temp(Src) 98.1 F (36.7 C) (Oral)  Ht 5\' 1"  (1.549 m)  Wt 208 lb 3.2 oz (94.439 kg)  BMI 39.36 kg/m2  SpO2 97% General appearance: alert, cooperative, appears stated age and no distress Head: Normocephalic, without obvious abnormality, atraumatic Eyes: conjunctivae/corneas clear. PERRL, EOM's intact. Fundi benign. Ears: normal TM's and external ear canals both ears Nose: Nares normal. Septum midline. Mucosa normal. No drainage or sinus tenderness. Throat: lips, mucosa, and tongue normal; teeth and gums normal Neck: no adenopathy, supple, symmetrical, trachea midline and thyroid not enlarged, symmetric, no tenderness/mass/nodules Back: symmetric, no curvature. ROM normal. No CVA tenderness. Lungs: clear to auscultation bilaterally Breasts: normal appearance, no masses or tenderness Heart: regular rate and rhythm, S1, S2 normal, no murmur, click, rub or gallop Abdomen: soft, non-tender; bowel sounds normal; no masses,  no organomegaly Pelvic: not indicated; post-menopausal, no abnormal Pap smears in past Extremities: extremities normal, atraumatic, no cyanosis or edema Pulses: 2+ and symmetric Skin: Skin color, texture, turgor normal. No rashes or lesions Lymph nodes: Cervical, supraclavicular, and axillary nodes normal. Neurologic: Alert and oriented X 3, normal strength and tone. Normal symmetric reflexes. Normal coordination and gait Psych-- no depression, no anxiety      Assessment:      cpe      Plan:     During the course of the visit the patient was educated and counseled about appropriate screening and preventive services including:    Pneumococcal vaccine   Screening mammography  Bone densitometry screening  Colorectal cancer screening  Glaucoma screening  Advanced directives: has an advanced directive - a copy HAS NOT been provided.  Diet review for nutrition referral? Yes ____  Not Indicated __x__   Patient Instructions (the written plan) was given to the patient.  Medicare Attestation I have personally reviewed: The patient's medical and social history  Their use of alcohol, tobacco or illicit drugs Their current medications and supplements The patient's functional ability including ADLs,fall risks, home safety risks, cognitive, and hearing and visual impairment Diet and physical activities Evidence for depression or mood disorders  The patient's weight, height, BMI, and visual acuity have been recorded in the chart.  I have made referrals, counseling, and provided education to the patient based on review of the above and I have provided the patient with a written personalized care plan for preventive services.     Loreen Freud, DO   08/06/2012

## 2012-08-06 NOTE — Addendum Note (Signed)
Addended by: Silvio Pate D on: 08/06/2012 04:42 PM   Modules accepted: Orders

## 2012-08-06 NOTE — Patient Instructions (Addendum)
Preventive Care for Adults, Female A healthy lifestyle and preventive care can promote health and wellness. Preventive health guidelines for women include the following key practices.  A routine yearly physical is a good way to check with your caregiver about your health and preventive screening. It is a chance to share any concerns and updates on your health, and to receive a thorough exam.  Visit your dentist for a routine exam and preventive care every 6 months. Brush your teeth twice a day and floss once a day. Good oral hygiene prevents tooth decay and gum disease.  The frequency of eye exams is based on your age, health, family medical history, use of contact lenses, and other factors. Follow your caregiver's recommendations for frequency of eye exams.  Eat a healthy diet. Foods like vegetables, fruits, whole grains, low-fat dairy products, and lean protein foods contain the nutrients you need without too many calories. Decrease your intake of foods high in solid fats, added sugars, and salt. Eat the right amount of calories for you.Get information about a proper diet from your caregiver, if necessary.  Regular physical exercise is one of the most important things you can do for your health. Most adults should get at least 150 minutes of moderate-intensity exercise (any activity that increases your heart rate and causes you to sweat) each week. In addition, most adults need muscle-strengthening exercises on 2 or more days a week.  Maintain a healthy weight. The body mass index (BMI) is a screening tool to identify possible weight problems. It provides an estimate of body fat based on height and weight. Your caregiver can help determine your BMI, and can help you achieve or maintain a healthy weight.For adults 20 years and older:  A BMI below 18.5 is considered underweight.  A BMI of 18.5 to 24.9 is normal.  A BMI of 25 to 29.9 is considered overweight.  A BMI of 30 and above is  considered obese.  Maintain normal blood lipids and cholesterol levels by exercising and minimizing your intake of saturated fat. Eat a balanced diet with plenty of fruit and vegetables. Blood tests for lipids and cholesterol should begin at age 20 and be repeated every 5 years. If your lipid or cholesterol levels are high, you are over 50, or you are at high risk for heart disease, you may need your cholesterol levels checked more frequently.Ongoing high lipid and cholesterol levels should be treated with medicines if diet and exercise are not effective.  If you smoke, find out from your caregiver how to quit. If you do not use tobacco, do not start.  If you are pregnant, do not drink alcohol. If you are breastfeeding, be very cautious about drinking alcohol. If you are not pregnant and choose to drink alcohol, do not exceed 1 drink per day. One drink is considered to be 12 ounces (355 mL) of beer, 5 ounces (148 mL) of wine, or 1.5 ounces (44 mL) of liquor.  Avoid use of street drugs. Do not share needles with anyone. Ask for help if you need support or instructions about stopping the use of drugs.  High blood pressure causes heart disease and increases the risk of stroke. Your blood pressure should be checked at least every 1 to 2 years. Ongoing high blood pressure should be treated with medicines if weight loss and exercise are not effective.  If you are 55 to 77 years old, ask your caregiver if you should take aspirin to prevent strokes.  Diabetes   screening involves taking a blood sample to check your fasting blood sugar level. This should be done once every 3 years, after age 45, if you are within normal weight and without risk factors for diabetes. Testing should be considered at a younger age or be carried out more frequently if you are overweight and have at least 1 risk factor for diabetes.  Breast cancer screening is essential preventive care for women. You should practice "breast  self-awareness." This means understanding the normal appearance and feel of your breasts and may include breast self-examination. Any changes detected, no matter how small, should be reported to a caregiver. Women in their 20s and 30s should have a clinical breast exam (CBE) by a caregiver as part of a regular health exam every 1 to 3 years. After age 40, women should have a CBE every year. Starting at age 40, women should consider having a mammography (breast X-ray test) every year. Women who have a family history of breast cancer should talk to their caregiver about genetic screening. Women at a high risk of breast cancer should talk to their caregivers about having magnetic resonance imaging (MRI) and a mammography every year.  The Pap test is a screening test for cervical cancer. A Pap test can show cell changes on the cervix that might become cervical cancer if left untreated. A Pap test is a procedure in which cells are obtained and examined from the lower end of the uterus (cervix).  Women should have a Pap test starting at age 21.  Between ages 21 and 29, Pap tests should be repeated every 2 years.  Beginning at age 30, you should have a Pap test every 3 years as long as the past 3 Pap tests have been normal.  Some women have medical problems that increase the chance of getting cervical cancer. Talk to your caregiver about these problems. It is especially important to talk to your caregiver if a new problem develops soon after your last Pap test. In these cases, your caregiver may recommend more frequent screening and Pap tests.  The above recommendations are the same for women who have or have not gotten the vaccine for human papillomavirus (HPV).  If you had a hysterectomy for a problem that was not cancer or a condition that could lead to cancer, then you no longer need Pap tests. Even if you no longer need a Pap test, a regular exam is a good idea to make sure no other problems are  starting.  If you are between ages 65 and 70, and you have had normal Pap tests going back 10 years, you no longer need Pap tests. Even if you no longer need a Pap test, a regular exam is a good idea to make sure no other problems are starting.  If you have had past treatment for cervical cancer or a condition that could lead to cancer, you need Pap tests and screening for cancer for at least 20 years after your treatment.  If Pap tests have been discontinued, risk factors (such as a new sexual partner) need to be reassessed to determine if screening should be resumed.  The HPV test is an additional test that may be used for cervical cancer screening. The HPV test looks for the virus that can cause the cell changes on the cervix. The cells collected during the Pap test can be tested for HPV. The HPV test could be used to screen women aged 30 years and older, and should   be used in women of any age who have unclear Pap test results. After the age of 30, women should have HPV testing at the same frequency as a Pap test.  Colorectal cancer can be detected and often prevented. Most routine colorectal cancer screening begins at the age of 50 and continues through age 75. However, your caregiver may recommend screening at an earlier age if you have risk factors for colon cancer. On a yearly basis, your caregiver may provide home test kits to check for hidden blood in the stool. Use of a small camera at the end of a tube, to directly examine the colon (sigmoidoscopy or colonoscopy), can detect the earliest forms of colorectal cancer. Talk to your caregiver about this at age 50, when routine screening begins. Direct examination of the colon should be repeated every 5 to 10 years through age 75, unless early forms of pre-cancerous polyps or small growths are found.  Hepatitis C blood testing is recommended for all people born from 1945 through 1965 and any individual with known risks for hepatitis C.  Practice  safe sex. Use condoms and avoid high-risk sexual practices to reduce the spread of sexually transmitted infections (STIs). STIs include gonorrhea, chlamydia, syphilis, trichomonas, herpes, HPV, and human immunodeficiency virus (HIV). Herpes, HIV, and HPV are viral illnesses that have no cure. They can result in disability, cancer, and death. Sexually active women aged 25 and younger should be checked for chlamydia. Older women with new or multiple partners should also be tested for chlamydia. Testing for other STIs is recommended if you are sexually active and at increased risk.  Osteoporosis is a disease in which the bones lose minerals and strength with aging. This can result in serious bone fractures. The risk of osteoporosis can be identified using a bone density scan. Women ages 65 and over and women at risk for fractures or osteoporosis should discuss screening with their caregivers. Ask your caregiver whether you should take a calcium supplement or vitamin D to reduce the rate of osteoporosis.  Menopause can be associated with physical symptoms and risks. Hormone replacement therapy is available to decrease symptoms and risks. You should talk to your caregiver about whether hormone replacement therapy is right for you.  Use sunscreen with sun protection factor (SPF) of 30 or more. Apply sunscreen liberally and repeatedly throughout the day. You should seek shade when your shadow is shorter than you. Protect yourself by wearing long sleeves, pants, a wide-brimmed hat, and sunglasses year round, whenever you are outdoors.  Once a month, do a whole body skin exam, using a mirror to look at the skin on your back. Notify your caregiver of new moles, moles that have irregular borders, moles that are larger than a pencil eraser, or moles that have changed in shape or color.  Stay current with required immunizations.  Influenza. You need a dose every fall (or winter). The composition of the flu vaccine  changes each year, so being vaccinated once is not enough.  Pneumococcal polysaccharide. You need 1 to 2 doses if you smoke cigarettes or if you have certain chronic medical conditions. You need 1 dose at age 65 (or older) if you have never been vaccinated.  Tetanus, diphtheria, pertussis (Tdap, Td). Get 1 dose of Tdap vaccine if you are younger than age 65, are over 65 and have contact with an infant, are a healthcare worker, are pregnant, or simply want to be protected from whooping cough. After that, you need a Td   booster dose every 10 years. Consult your caregiver if you have not had at least 3 tetanus and diphtheria-containing shots sometime in your life or have a deep or dirty wound.  HPV. You need this vaccine if you are a woman age 26 or younger. The vaccine is given in 3 doses over 6 months.  Measles, mumps, rubella (MMR). You need at least 1 dose of MMR if you were born in 1957 or later. You may also need a second dose.  Meningococcal. If you are age 19 to 21 and a first-year college student living in a residence hall, or have one of several medical conditions, you need to get vaccinated against meningococcal disease. You may also need additional booster doses.  Zoster (shingles). If you are age 60 or older, you should get this vaccine.  Varicella (chickenpox). If you have never had chickenpox or you were vaccinated but received only 1 dose, talk to your caregiver to find out if you need this vaccine.  Hepatitis A. You need this vaccine if you have a specific risk factor for hepatitis A virus infection or you simply wish to be protected from this disease. The vaccine is usually given as 2 doses, 6 to 18 months apart.  Hepatitis B. You need this vaccine if you have a specific risk factor for hepatitis B virus infection or you simply wish to be protected from this disease. The vaccine is given in 3 doses, usually over 6 months. Preventive Services / Frequency Ages 19 to 39  Blood  pressure check.** / Every 1 to 2 years.  Lipid and cholesterol check.** / Every 5 years beginning at age 20.  Clinical breast exam.** / Every 3 years for women in their 20s and 30s.  Pap test.** / Every 2 years from ages 21 through 29. Every 3 years starting at age 30 through age 65 or 70 with a history of 3 consecutive normal Pap tests.  HPV screening.** / Every 3 years from ages 30 through ages 65 to 70 with a history of 3 consecutive normal Pap tests.  Hepatitis C blood test.** / For any individual with known risks for hepatitis C.  Skin self-exam. / Monthly.  Influenza immunization.** / Every year.  Pneumococcal polysaccharide immunization.** / 1 to 2 doses if you smoke cigarettes or if you have certain chronic medical conditions.  Tetanus, diphtheria, pertussis (Tdap, Td) immunization. / A one-time dose of Tdap vaccine. After that, you need a Td booster dose every 10 years.  HPV immunization. / 3 doses over 6 months, if you are 26 and younger.  Measles, mumps, rubella (MMR) immunization. / You need at least 1 dose of MMR if you were born in 1957 or later. You may also need a second dose.  Meningococcal immunization. / 1 dose if you are age 19 to 21 and a first-year college student living in a residence hall, or have one of several medical conditions, you need to get vaccinated against meningococcal disease. You may also need additional booster doses.  Varicella immunization.** / Consult your caregiver.  Hepatitis A immunization.** / Consult your caregiver. 2 doses, 6 to 18 months apart.  Hepatitis B immunization.** / Consult your caregiver. 3 doses usually over 6 months. Ages 40 to 64  Blood pressure check.** / Every 1 to 2 years.  Lipid and cholesterol check.** / Every 5 years beginning at age 20.  Clinical breast exam.** / Every year after age 40.  Mammogram.** / Every year beginning at age 40   and continuing for as long as you are in good health. Consult with your  caregiver.  Pap test.** / Every 3 years starting at age 30 through age 65 or 70 with a history of 3 consecutive normal Pap tests.  HPV screening.** / Every 3 years from ages 30 through ages 65 to 70 with a history of 3 consecutive normal Pap tests.  Fecal occult blood test (FOBT) of stool. / Every year beginning at age 50 and continuing until age 75. You may not need to do this test if you get a colonoscopy every 10 years.  Flexible sigmoidoscopy or colonoscopy.** / Every 5 years for a flexible sigmoidoscopy or every 10 years for a colonoscopy beginning at age 50 and continuing until age 75.  Hepatitis C blood test.** / For all people born from 1945 through 1965 and any individual with known risks for hepatitis C.  Skin self-exam. / Monthly.  Influenza immunization.** / Every year.  Pneumococcal polysaccharide immunization.** / 1 to 2 doses if you smoke cigarettes or if you have certain chronic medical conditions.  Tetanus, diphtheria, pertussis (Tdap, Td) immunization.** / A one-time dose of Tdap vaccine. After that, you need a Td booster dose every 10 years.  Measles, mumps, rubella (MMR) immunization. / You need at least 1 dose of MMR if you were born in 1957 or later. You may also need a second dose.  Varicella immunization.** / Consult your caregiver.  Meningococcal immunization.** / Consult your caregiver.  Hepatitis A immunization.** / Consult your caregiver. 2 doses, 6 to 18 months apart.  Hepatitis B immunization.** / Consult your caregiver. 3 doses, usually over 6 months. Ages 65 and over  Blood pressure check.** / Every 1 to 2 years.  Lipid and cholesterol check.** / Every 5 years beginning at age 20.  Clinical breast exam.** / Every year after age 40.  Mammogram.** / Every year beginning at age 40 and continuing for as long as you are in good health. Consult with your caregiver.  Pap test.** / Every 3 years starting at age 30 through age 65 or 70 with a 3  consecutive normal Pap tests. Testing can be stopped between 65 and 70 with 3 consecutive normal Pap tests and no abnormal Pap or HPV tests in the past 10 years.  HPV screening.** / Every 3 years from ages 30 through ages 65 or 70 with a history of 3 consecutive normal Pap tests. Testing can be stopped between 65 and 70 with 3 consecutive normal Pap tests and no abnormal Pap or HPV tests in the past 10 years.  Fecal occult blood test (FOBT) of stool. / Every year beginning at age 50 and continuing until age 75. You may not need to do this test if you get a colonoscopy every 10 years.  Flexible sigmoidoscopy or colonoscopy.** / Every 5 years for a flexible sigmoidoscopy or every 10 years for a colonoscopy beginning at age 50 and continuing until age 75.  Hepatitis C blood test.** / For all people born from 1945 through 1965 and any individual with known risks for hepatitis C.  Osteoporosis screening.** / A one-time screening for women ages 65 and over and women at risk for fractures or osteoporosis.  Skin self-exam. / Monthly.  Influenza immunization.** / Every year.  Pneumococcal polysaccharide immunization.** / 1 dose at age 65 (or older) if you have never been vaccinated.  Tetanus, diphtheria, pertussis (Tdap, Td) immunization. / A one-time dose of Tdap vaccine if you are over   65 and have contact with an infant, are a healthcare worker, or simply want to be protected from whooping cough. After that, you need a Td booster dose every 10 years.  Varicella immunization.** / Consult your caregiver.  Meningococcal immunization.** / Consult your caregiver.  Hepatitis A immunization.** / Consult your caregiver. 2 doses, 6 to 18 months apart.  Hepatitis B immunization.** / Check with your caregiver. 3 doses, usually over 6 months. ** Family history and personal history of risk and conditions may change your caregiver's recommendations. Document Released: 05/01/2001 Document Revised: 05/28/2011  Document Reviewed: 07/31/2010 [employer] Patient Information 2014 [employer].  

## 2012-08-08 ENCOUNTER — Other Ambulatory Visit: Payer: Self-pay

## 2012-08-08 LAB — URINE CULTURE: Colony Count: >=100000

## 2012-08-08 MED ORDER — CIPROFLOXACIN HCL 500 MG PO TABS: 500.0000 mg | ORAL_TABLET | Freq: Two times a day (BID) | ORAL | Status: DC

## 2012-08-08 MED ORDER — SIMVASTATIN 20 MG PO TABS: 20.0000 mg | ORAL_TABLET | Freq: Every day | ORAL | Status: DC

## 2012-12-24 ENCOUNTER — Encounter: Payer: Self-pay | Admitting: Family Medicine

## 2012-12-24 ENCOUNTER — Ambulatory Visit (INDEPENDENT_AMBULATORY_CARE_PROVIDER_SITE_OTHER): Payer: Medicare Other | Admitting: Family Medicine

## 2012-12-24 VITALS — BP 136/84 | HR 70 | Temp 98.1°F | Wt 206.8 lb

## 2012-12-24 DIAGNOSIS — M1990 Unspecified osteoarthritis, unspecified site: Secondary | ICD-10-CM

## 2012-12-24 DIAGNOSIS — R300 Dysuria: Secondary | ICD-10-CM

## 2012-12-24 DIAGNOSIS — N390 Urinary tract infection, site not specified: Secondary | ICD-10-CM

## 2012-12-24 LAB — POCT URINALYSIS DIPSTICK
Blood, UA: NEGATIVE
Glucose, UA: NEGATIVE
Ketones, UA: NEGATIVE
Protein, UA: NEGATIVE
Spec Grav, UA: 1.01
Urobilinogen, UA: 0.2

## 2012-12-24 MED ORDER — MELOXICAM 15 MG PO TABS: ORAL_TABLET | ORAL | Status: DC

## 2012-12-24 MED ORDER — CIPROFLOXACIN HCL 500 MG PO TABS: 500.0000 mg | ORAL_TABLET | Freq: Two times a day (BID) | ORAL | Status: AC

## 2012-12-24 NOTE — Progress Notes (Signed)
  Subjective:    Felicia Martin is a 79 y.o. female who complains of burning with urination, frequency and urgency. She has had symptoms for 4 days. Patient also complains of nothing else. Patient denies back pain, congestion, cough, fever, headache, rhinitis, sorethroat, stomach ache and vaginal discharge. Patient does not have a history of recurrent UTI. Patient does not have a history of pyelonephritis.   The following portions of the patient's history were reviewed and updated as appropriate: allergies, current medications, past family history, past medical history, past social history, past surgical history and problem list.  Review of Systems Pertinent items are noted in HPI.    Objective:    BP 136/84  Pulse 70  Temp(Src) 98.1 F (36.7 C) (Oral)  Wt 206 lb 12.8 oz (93.804 kg)  BMI 39.09 kg/m2  SpO2 97% General appearance: alert, cooperative, appears stated age and no distress Abdomen: soft, non-tender; bowel sounds normal; no masses,  no organomegaly  Laboratory:  Urine dipstick: 2+ for leukocyte esterase and positive for nitrites.   Micro exam: not done.    Assessment:    Acute cystitis     Plan:    Medications: ciprofloxacin. Maintain adequate hydration. Follow up if symptoms not improving, and as needed.

## 2012-12-24 NOTE — Patient Instructions (Signed)
Urinary Tract Infection  Urinary tract infections (UTIs) can develop anywhere along your urinary tract. Your urinary tract is your body's drainage system for removing wastes and extra water. Your urinary tract includes two kidneys, two ureters, a bladder, and a urethra. Your kidneys are a pair of bean-shaped organs. Each kidney is about the size of your fist. They are located below your ribs, one on each side of your spine.  CAUSES  Infections are caused by microbes, which are microscopic organisms, including fungi, viruses, and bacteria. These organisms are so small that they can only be seen through a microscope. Bacteria are the microbes that most commonly cause UTIs.  SYMPTOMS   Symptoms of UTIs may vary by age and gender of the patient and by the location of the infection. Symptoms in young women typically include a frequent and intense urge to urinate and a painful, burning feeling in the bladder or urethra during urination. Older women and men are more likely to be tired, shaky, and weak and have muscle aches and abdominal pain. A fever may mean the infection is in your kidneys. Other symptoms of a kidney infection include pain in your back or sides below the ribs, nausea, and vomiting.  DIAGNOSIS  To diagnose a UTI, your caregiver will ask you about your symptoms. Your caregiver also will ask to provide a urine sample. The urine sample will be tested for bacteria and white blood cells. White blood cells are made by your body to help fight infection.  TREATMENT   Typically, UTIs can be treated with medication. Because most UTIs are caused by a bacterial infection, they usually can be treated with the use of antibiotics. The choice of antibiotic and length of treatment depend on your symptoms and the type of bacteria causing your infection.  HOME CARE INSTRUCTIONS   If you were prescribed antibiotics, take them exactly as your caregiver instructs you. Finish the medication even if you feel better after you  have only taken some of the medication.   Drink enough water and fluids to keep your urine clear or pale yellow.   Avoid caffeine, tea, and carbonated beverages. They tend to irritate your bladder.   Empty your bladder often. Avoid holding urine for long periods of time.   Empty your bladder before and after sexual intercourse.   After a bowel movement, women should cleanse from front to back. Use each tissue only once.  SEEK MEDICAL CARE IF:    You have back pain.   You develop a fever.   Your symptoms do not begin to resolve within 3 days.  SEEK IMMEDIATE MEDICAL CARE IF:    You have severe back pain or lower abdominal pain.   You develop chills.   You have nausea or vomiting.   You have continued burning or discomfort with urination.  MAKE SURE YOU:    Understand these instructions.   Will watch your condition.   Will get help right away if you are not doing well or get worse.  Document Released: 12/13/2004 Document Revised: 09/04/2011 Document Reviewed: 04/13/2011  [employer] Patient Information 2014 [employer].

## 2012-12-26 LAB — URINE CULTURE: Colony Count: >=100000

## 2013-03-06 ENCOUNTER — Ambulatory Visit (INDEPENDENT_AMBULATORY_CARE_PROVIDER_SITE_OTHER): Payer: Medicare Other | Admitting: Family Medicine

## 2013-03-06 ENCOUNTER — Encounter: Payer: Self-pay | Admitting: Family Medicine

## 2013-03-06 VITALS — BP 126/80 | HR 92 | Temp 98.3°F | Wt 209.0 lb

## 2013-03-06 DIAGNOSIS — R300 Dysuria: Secondary | ICD-10-CM

## 2013-03-06 LAB — POCT URINALYSIS DIPSTICK
Glucose, UA: NEGATIVE
Protein, UA: NEGATIVE
Urobilinogen, UA: 0.2

## 2013-03-06 MED ORDER — CIPROFLOXACIN HCL 500 MG PO TABS: 500.0000 mg | ORAL_TABLET | Freq: Two times a day (BID) | ORAL | Status: AC

## 2013-03-06 NOTE — Progress Notes (Signed)
Pre visit review using our clinic review tool, if applicable. No additional management support is needed unless otherwise documented below in the visit note. 

## 2013-03-06 NOTE — Progress Notes (Signed)
  Subjective:    Felicia Martin is a 79 y.o. female who complains of dysuria. She has had symptoms for 1 week. Patient also complains of burning in calfs at night when she is in bed only.  . Patient denies back pain, congestion, cough, fever, headache, rhinitis, sorethroat, stomach ache and vaginal discharge. Patient does not have a history of recurrent UTI. Patient does not have a history of pyelonephritis.    The following portions of the patient's history were reviewed and updated as appropriate: allergies, current medications, past family history, past medical history, past social history, past surgical history and problem list.  Review of Systems Pertinent items are noted in HPI.    Objective:    BP 126/80  Pulse 92  Temp(Src) 98.3 F (36.8 C) (Oral)  Wt 209 lb (94.802 kg)  SpO2 97% General appearance: alert, cooperative, appears stated age and no distress Abdomen: soft, non-tender; bowel sounds normal; no masses,  no organomegaly Extremities: extremities normal, atraumatic, no cyanosis or edema Neurologic: Alert and oriented X 3, normal strength and tone. Normal symmetric reflexes. Normal coordination and gait  Laboratory:  Urine dipstick: trace for leukocyte esterase.   Micro exam: not done.    Assessment:    dysuria     Plan:    Medications: not indicated at this time. Maintain adequate hydration. Follow up if symptoms not improving, and as needed.

## 2013-03-06 NOTE — Patient Instructions (Signed)

## 2013-03-10 ENCOUNTER — Telehealth: Payer: Self-pay | Admitting: *Deleted

## 2013-03-10 DIAGNOSIS — E785 Hyperlipidemia, unspecified: Secondary | ICD-10-CM

## 2013-03-10 LAB — URINE CULTURE: Colony Count: 40000

## 2013-03-10 NOTE — Telephone Encounter (Signed)
Patient presented to the office requesting U/A cx results. No growth so far, made pt aware. Patient also requested a lab appt for next week for lipid and hepatic panel. Future orders placed and appt made.

## 2013-03-13 ENCOUNTER — Other Ambulatory Visit: Payer: Self-pay | Admitting: *Deleted

## 2013-03-13 MED ORDER — AMOXICILLIN 500 MG PO CAPS: 500.0000 mg | ORAL_CAPSULE | Freq: Two times a day (BID) | ORAL | Status: DC

## 2013-03-18 ENCOUNTER — Other Ambulatory Visit (INDEPENDENT_AMBULATORY_CARE_PROVIDER_SITE_OTHER): Payer: Medicare Other

## 2013-03-18 DIAGNOSIS — E785 Hyperlipidemia, unspecified: Secondary | ICD-10-CM

## 2013-03-18 LAB — LIPID PANEL
Cholesterol: 298 mg/dL — ABNORMAL HIGH (ref 0–200)
HDL: 53.4 mg/dL (ref >39.00)
Total CHOL/HDL Ratio: 6
Triglycerides: 103 mg/dL (ref 0.0–149.0)

## 2013-03-18 LAB — HEPATIC FUNCTION PANEL
Alkaline Phosphatase: 68 U/L (ref 39–117)
Bilirubin, Direct: 0 mg/dL (ref 0.0–0.3)
Total Bilirubin: 0.7 mg/dL (ref 0.3–1.2)
Total Protein: 7.8 g/dL (ref 6.0–8.3)

## 2013-03-18 LAB — LDL CHOLESTEROL, DIRECT: Direct LDL: 230.7 mg/dL

## 2013-03-27 MED ORDER — SIMVASTATIN 40 MG PO TABS: 40.0000 mg | ORAL_TABLET | Freq: Every day | ORAL | Status: DC

## 2013-04-13 ENCOUNTER — Other Ambulatory Visit: Payer: Self-pay | Admitting: Family Medicine

## 2013-04-13 DIAGNOSIS — Z1231 Encounter for screening mammogram for malignant neoplasm of breast: Secondary | ICD-10-CM

## 2013-05-14 ENCOUNTER — Ambulatory Visit (HOSPITAL_COMMUNITY): Payer: Medicare Other

## 2013-05-21 ENCOUNTER — Ambulatory Visit (HOSPITAL_COMMUNITY)
Admission: RE | Admit: 2013-05-21 | Discharge: 2013-05-21 | Disposition: A | Discharge status: Home / Self Care | Payer: Medicare Other | Source: Ambulatory Visit | Attending: Family Medicine | Admitting: Family Medicine

## 2013-05-21 DIAGNOSIS — Z1231 Encounter for screening mammogram for malignant neoplasm of breast: Secondary | ICD-10-CM | POA: Insufficient documentation

## 2013-07-06 ENCOUNTER — Other Ambulatory Visit: Payer: Self-pay | Admitting: Family Medicine

## 2013-11-04 ENCOUNTER — Ambulatory Visit (INDEPENDENT_AMBULATORY_CARE_PROVIDER_SITE_OTHER): Payer: Medicare Other | Admitting: Family Medicine

## 2013-11-04 VITALS — BP 126/80 | HR 75 | Temp 98.0°F | Resp 17 | Ht 60.5 in | Wt 204.0 lb

## 2013-11-04 DIAGNOSIS — H6120 Impacted cerumen, unspecified ear: Secondary | ICD-10-CM

## 2013-11-04 DIAGNOSIS — H6121 Impacted cerumen, right ear: Secondary | ICD-10-CM

## 2013-11-04 NOTE — Progress Notes (Signed)
[facility] [address] [place] [address] [phone]  Date:  11/04/2013   Name:  Felicia Martin   DOB:  [birth year]   MRN:  [id number]  PCP:  Garnet Koyanagi, DO    Chief Complaint: Hearing Problem   History of Present Illness:  Felicia Martin is a 80 y.o. very pleasant female patient who presents with the following:  She notes that her ears feel clogged- the right one is worse.  She often needs to get her ears flushed for cerumen impaction.  Otherwise she feels well today.  No ear pain.  Patient Active Problem List   Diagnosis Date Noted  . CANDIDIASIS, VULVAR 05/12/2010  . UTI 05/04/2010  . DEGENERATIVE JOINT DISEASE 09/22/2008  . DYSPHAGIA UNSPECIFIED 09/22/2008  . OSTEOPENIA 11/05/2007  . TELANGIECTASIA 05/29/2007  . HYPERCHOLESTEROLEMIA 02/01/2007  . PULMONARY FIBROSIS 02/01/2007  . SARCOIDOSIS 11/20/2006  . ASTHMA 11/20/2006    Past Medical History  Diagnosis Date  . Pulmonary fibrosis   . Sarcoidosis   . Asthma   . Hypercholesterolemia     Past Surgical History  Procedure Laterality Date  . Lung surgery  1960    Right lung    History  Substance Use Topics  . Smoking status: Former Smoker -- 0.50 packs/day for 40 years    Types: Cigarettes    Quit date: 05/25/1999  . Smokeless tobacco: Never Used  . Alcohol Use: No    Family History  Problem Relation Age of Onset  . Hypertension Sister     x's 3  . Diabetes Sister     x's 3  . Hyperlipidemia    . Liver cancer Mother   . Liver cancer Father   . Cancer Father   . Cancer Brother   . Diabetes Brother     No Known Allergies  Medication list has been reviewed and updated.  Current Outpatient Prescriptions on File Prior to Visit  Medication Sig Dispense Refill  . albuterol (PROVENTIL HFA;VENTOLIN HFA) 108 (90 BASE) MCG/ACT inhaler Inhale 2 puffs into the lungs every 6 (six) hours as needed.  1 Inhaler  1  . aspirin 81 MG tablet Take 81 mg by mouth daily.      .  beclomethasone (QVAR) 40 MCG/ACT inhaler Inhale 2 puffs into the lungs 2 (two) times daily.  1 Inhaler  12  . Calcium Carbonate-Vitamin D (CALCIUM-VITAMIN D) 500-200 MG-UNIT per tablet Take 1 tablet by mouth 2 (two) times daily with a meal.       No current facility-administered medications on file prior to visit.    Review of Systems:  As per HPI- otherwise negative.   Physical Examination: Filed Vitals:   11/04/13 1151  BP: 126/80  Pulse: 75  Temp: 98 F (36.7 C)  Resp: 17   Filed Vitals:   11/04/13 1151  Height: 5' 0.5" (1.537 m)  Weight: 204 lb (92.534 kg)   Body mass index is 39.17 kg/(m^2). Ideal Body Weight: Weight in (lb) to have BMI = 25: 129.9  GEN: WDWN, NAD, Non-toxic, A & O x 3 HEENT: Atraumatic, Normocephalic. Neck supple. No masses, No LAD. Ears and Nose: No external deformity. CV: RRR, No M/G/R. No JVD. No thrill. No extra heart sounds. PULM: CTA B, no wheezes, crackles, rhonchi. No retractions. No resp. distress. No accessory muscle use. ABD: S, NT, ND, +BS. No rebound. No HSM. EXTR: No c/c/e NEURO Normal gait.  PSYCH: Normally interactive. Conversant. Not depressed or anxious appearing.  Calm demeanor.  Right TM is obscured by cerumen.  This was irrigated and removed successfully.    TM and canal then normal . Left ear normal  Assessment and Plan: Cerumen impaction, right  Resolved through irrigation  Signed Lamar Blinks, MD

## 2013-11-25 ENCOUNTER — Ambulatory Visit: Payer: Medicare Other | Admitting: Medical

## 2013-12-07 ENCOUNTER — Ambulatory Visit (INDEPENDENT_AMBULATORY_CARE_PROVIDER_SITE_OTHER): Payer: Medicare Other | Admitting: Medical

## 2013-12-07 ENCOUNTER — Encounter: Payer: Self-pay | Admitting: Medical

## 2013-12-07 VITALS — BP 148/75 | HR 70 | Temp 98.4°F | Ht 65.75 in | Wt 207.0 lb

## 2013-12-07 DIAGNOSIS — Z1211 Encounter for screening for malignant neoplasm of colon: Secondary | ICD-10-CM

## 2013-12-07 DIAGNOSIS — E7800 Pure hypercholesterolemia, unspecified: Secondary | ICD-10-CM

## 2013-12-07 DIAGNOSIS — R351 Nocturia: Secondary | ICD-10-CM

## 2013-12-07 DIAGNOSIS — R9431 Abnormal electrocardiogram [ECG] [EKG]: Secondary | ICD-10-CM

## 2013-12-07 DIAGNOSIS — IMO0001 Reserved for inherently not codable concepts without codable children: Secondary | ICD-10-CM | POA: Insufficient documentation

## 2013-12-07 DIAGNOSIS — Z0000 Encounter for general adult medical examination without abnormal findings: Secondary | ICD-10-CM

## 2013-12-07 DIAGNOSIS — Z1231 Encounter for screening mammogram for malignant neoplasm of breast: Secondary | ICD-10-CM

## 2013-12-07 DIAGNOSIS — R030 Elevated blood-pressure reading, without diagnosis of hypertension: Secondary | ICD-10-CM

## 2013-12-07 LAB — POCT URINALYSIS DIPSTICK
Bilirubin, UA: NEGATIVE
Glucose, UA: NEGATIVE
Ketones, UA: NEGATIVE
Leukocytes, UA: NEGATIVE
Nitrite, UA: NEGATIVE
RBC UA: NEGATIVE
SPEC GRAV UA: 1.02
UROBILINOGEN UA: NEGATIVE
pH, UA: 5

## 2013-12-07 NOTE — Patient Instructions (Signed)
Preventive Care for Adults A healthy lifestyle and preventive care can promote health and wellness. Preventive health guidelines for women include the following key practices.  A routine yearly physical is a good way to check with your health care provider about your health and preventive screening. It is a chance to share any concerns and updates on your health and to receive a thorough exam.  Visit your dentist for a routine exam and preventive care every 6 months. Brush your teeth twice a day and floss once a day. Good oral hygiene prevents tooth decay and gum disease.  The frequency of eye exams is based on your age, health, family medical history, use of contact lenses, and other factors. Follow your health care provider's recommendations for frequency of eye exams.  Eat a healthy diet. Foods like vegetables, fruits, whole grains, low-fat dairy products, and lean protein foods contain the nutrients you need without too many calories. Decrease your intake of foods high in solid fats, added sugars, and salt. Eat the right amount of calories for you.Get information about a proper diet from your health care provider, if necessary.  Regular physical exercise is one of the most important things you can do for your health. Most adults should get at least 150 minutes of moderate-intensity exercise (any activity that increases your heart rate and causes you to sweat) each week. In addition, most adults need muscle-strengthening exercises on 2 or more days a week.  Maintain a healthy weight. The body mass index (BMI) is a screening tool to identify possible weight problems. It provides an estimate of body fat based on height and weight. Your health care provider can find your BMI and can help you achieve or maintain a healthy weight.For adults 20 years and older:  A BMI below 18.5 is considered underweight.  A BMI of 18.5 to 24.9 is normal.  A BMI of 25 to 29.9 is considered overweight.  A BMI of  30 and above is considered obese.  Maintain normal blood lipids and cholesterol levels by exercising and minimizing your intake of saturated fat. Eat a balanced diet with plenty of fruit and vegetables. Blood tests for lipids and cholesterol should begin at age 76 and be repeated every 5 years. If your lipid or cholesterol levels are high, you are over 50, or you are at high risk for heart disease, you may need your cholesterol levels checked more frequently.Ongoing high lipid and cholesterol levels should be treated with medicines if diet and exercise are not working.  If you smoke, find out from your health care provider how to quit. If you do not use tobacco, do not start.  Lung cancer screening is recommended for adults aged 22-80 years who are at high risk for developing lung cancer because of a history of smoking. A yearly low-dose CT scan of the lungs is recommended for people who have at least a 30-pack-year history of smoking and are a current smoker or have quit within the past 15 years. A pack year of smoking is smoking an average of 1 pack of cigarettes a day for 1 year (for example: 1 pack a day for 30 years or 2 packs a day for 15 years). Yearly screening should continue until the smoker has stopped smoking for at least 15 years. Yearly screening should be stopped for people who develop a health problem that would prevent them from having lung cancer treatment.  If you are pregnant, do not drink alcohol. If you are breastfeeding,  be very cautious about drinking alcohol. If you are not pregnant and choose to drink alcohol, do not have more than 1 drink per day. One drink is considered to be 12 ounces (355 mL) of beer, 5 ounces (148 mL) of wine, or 1.5 ounces (44 mL) of liquor.  Avoid use of street drugs. Do not share needles with anyone. Ask for help if you need support or instructions about stopping the use of drugs.  High blood pressure causes heart disease and increases the risk of  stroke. Your blood pressure should be checked at least every 1 to 2 years. Ongoing high blood pressure should be treated with medicines if weight loss and exercise do not work.  If you are 75-52 years old, ask your health care provider if you should take aspirin to prevent strokes.  Diabetes screening involves taking a blood sample to check your fasting blood sugar level. This should be done once every 3 years, after age 15, if you are within normal weight and without risk factors for diabetes. Testing should be considered at a younger age or be carried out more frequently if you are overweight and have at least 1 risk factor for diabetes.  Breast cancer screening is essential preventive care for women. You should practice "breast self-awareness." This means understanding the normal appearance and feel of your breasts and may include breast self-examination. Any changes detected, no matter how small, should be reported to a health care provider. Women in their 58s and 30s should have a clinical breast exam (CBE) by a health care provider as part of a regular health exam every 1 to 3 years. After age 16, women should have a CBE every year. Starting at age 53, women should consider having a mammogram (breast X-ray test) every year. Women who have a family history of breast cancer should talk to their health care provider about genetic screening. Women at a high risk of breast cancer should talk to their health care providers about having an MRI and a mammogram every year.  Breast cancer gene (BRCA)-related cancer risk assessment is recommended for women who have family members with BRCA-related cancers. BRCA-related cancers include breast, ovarian, tubal, and peritoneal cancers. Having family members with these cancers may be associated with an increased risk for harmful changes (mutations) in the breast cancer genes BRCA1 and BRCA2. Results of the assessment will determine the need for genetic counseling and  BRCA1 and BRCA2 testing.  Routine pelvic exams to screen for cancer are no longer recommended for nonpregnant women who are considered low risk for cancer of the pelvic organs (ovaries, uterus, and vagina) and who do not have symptoms. Ask your health care provider if a screening pelvic exam is right for you.  If you have had past treatment for cervical cancer or a condition that could lead to cancer, you need Pap tests and screening for cancer for at least 20 years after your treatment. If Pap tests have been discontinued, your risk factors (such as having a new sexual partner) need to be reassessed to determine if screening should be resumed. Some women have medical problems that increase the chance of getting cervical cancer. In these cases, your health care provider may recommend more frequent screening and Pap tests.  The HPV test is an additional test that may be used for cervical cancer screening. The HPV test looks for the virus that can cause the cell changes on the cervix. The cells collected during the Pap test can be  tested for HPV. The HPV test could be used to screen women aged 30 years and older, and should be used in women of any age who have unclear Pap test results. After the age of 30, women should have HPV testing at the same frequency as a Pap test.  Colorectal cancer can be detected and often prevented. Most routine colorectal cancer screening begins at the age of 50 years and continues through age 75 years. However, your health care provider may recommend screening at an earlier age if you have risk factors for colon cancer. On a yearly basis, your health care provider may provide home test kits to check for hidden blood in the stool. Use of a small camera at the end of a tube, to directly examine the colon (sigmoidoscopy or colonoscopy), can detect the earliest forms of colorectal cancer. Talk to your health care provider about this at age 50, when routine screening begins. Direct  exam of the colon should be repeated every 5-10 years through age 75 years, unless early forms of pre-cancerous polyps or small growths are found.  People who are at an increased risk for hepatitis B should be screened for this virus. You are considered at high risk for hepatitis B if:  You were born in a country where hepatitis B occurs often. Talk with your health care provider about which countries are considered high risk.  Your parents were born in a high-risk country and you have not received a shot to protect against hepatitis B (hepatitis B vaccine).  You have HIV or AIDS.  You use needles to inject street drugs.  You live with, or have sex with, someone who has hepatitis B.  You get hemodialysis treatment.  You take certain medicines for conditions like cancer, organ transplantation, and autoimmune conditions.  Hepatitis C blood testing is recommended for all people born from 1945 through 1965 and any individual with known risks for hepatitis C.  Practice safe sex. Use condoms and avoid high-risk sexual practices to reduce the spread of sexually transmitted infections (STIs). STIs include gonorrhea, chlamydia, syphilis, trichomonas, herpes, HPV, and human immunodeficiency virus (HIV). Herpes, HIV, and HPV are viral illnesses that have no cure. They can result in disability, cancer, and death.  You should be screened for sexually transmitted illnesses (STIs) including gonorrhea and chlamydia if:  You are sexually active and are younger than 24 years.  You are older than 24 years and your health care provider tells you that you are at risk for this type of infection.  Your sexual activity has changed since you were last screened and you are at an increased risk for chlamydia or gonorrhea. Ask your health care provider if you are at risk.  If you are at risk of being infected with HIV, it is recommended that you take a prescription medicine daily to prevent HIV infection. This is  called preexposure prophylaxis (PrEP). You are considered at risk if:  You are a heterosexual woman, are sexually active, and are at increased risk for HIV infection.  You take drugs by injection.  You are sexually active with a partner who has HIV.  Talk with your health care provider about whether you are at high risk of being infected with HIV. If you choose to begin PrEP, you should first be tested for HIV. You should then be tested every 3 months for as long as you are taking PrEP.  Osteoporosis is a disease in which the bones lose minerals and strength   with aging. This can result in serious bone fractures or breaks. The risk of osteoporosis can be identified using a bone density scan. Women ages 65 years and over and women at risk for fractures or osteoporosis should discuss screening with their health care providers. Ask your health care provider whether you should take a calcium supplement or vitamin D to reduce the rate of osteoporosis.  Menopause can be associated with physical symptoms and risks. Hormone replacement therapy is available to decrease symptoms and risks. You should talk to your health care provider about whether hormone replacement therapy is right for you.  Use sunscreen. Apply sunscreen liberally and repeatedly throughout the day. You should seek shade when your shadow is shorter than you. Protect yourself by wearing long sleeves, pants, a wide-brimmed hat, and sunglasses year round, whenever you are outdoors.  Once a month, do a whole body skin exam, using a mirror to look at the skin on your back. Tell your health care provider of new moles, moles that have irregular borders, moles that are larger than a pencil eraser, or moles that have changed in shape or color.  Stay current with required vaccines (immunizations).  Influenza vaccine. All adults should be immunized every year.  Tetanus, diphtheria, and acellular pertussis (Td, Tdap) vaccine. Pregnant women should  receive 1 dose of Tdap vaccine during each pregnancy. The dose should be obtained regardless of the length of time since the last dose. Immunization is preferred during the 27th-36th week of gestation. An adult who has not previously received Tdap or who does not know her vaccine status should receive 1 dose of Tdap. This initial dose should be followed by tetanus and diphtheria toxoids (Td) booster doses every 10 years. Adults with an unknown or incomplete history of completing a 3-dose immunization series with Td-containing vaccines should begin or complete a primary immunization series including a Tdap dose. Adults should receive a Td booster every 10 years.  Varicella vaccine. An adult without evidence of immunity to varicella should receive 2 doses or a second dose if she has previously received 1 dose. Pregnant females who do not have evidence of immunity should receive the first dose after pregnancy. This first dose should be obtained before leaving the health care facility. The second dose should be obtained 4-8 weeks after the first dose.  Human papillomavirus (HPV) vaccine. Females aged 13-26 years who have not received the vaccine previously should obtain the 3-dose series. The vaccine is not recommended for use in pregnant females. However, pregnancy testing is not needed before receiving a dose. If a female is found to be pregnant after receiving a dose, no treatment is needed. In that case, the remaining doses should be delayed until after the pregnancy. Immunization is recommended for any person with an immunocompromised condition through the age of 26 years if she did not get any or all doses earlier. During the 3-dose series, the second dose should be obtained 4-8 weeks after the first dose. The third dose should be obtained 24 weeks after the first dose and 16 weeks after the second dose.  Zoster vaccine. One dose is recommended for adults aged 60 years or older unless certain conditions are  present.  Measles, mumps, and rubella (MMR) vaccine. Adults born before 1957 generally are considered immune to measles and mumps. Adults born in 1957 or later should have 1 or more doses of MMR vaccine unless there is a contraindication to the vaccine or there is laboratory evidence of immunity to   each of the three diseases. A routine second dose of MMR vaccine should be obtained at least 28 days after the first dose for students attending postsecondary schools, health care workers, or international travelers. People who received inactivated measles vaccine or an unknown type of measles vaccine during 1963-1967 should receive 2 doses of MMR vaccine. People who received inactivated mumps vaccine or an unknown type of mumps vaccine before 1979 and are at high risk for mumps infection should consider immunization with 2 doses of MMR vaccine. For females of childbearing age, rubella immunity should be determined. If there is no evidence of immunity, females who are not pregnant should be vaccinated. If there is no evidence of immunity, females who are pregnant should delay immunization until after pregnancy. Unvaccinated health care workers born before 1957 who lack laboratory evidence of measles, mumps, or rubella immunity or laboratory confirmation of disease should consider measles and mumps immunization with 2 doses of MMR vaccine or rubella immunization with 1 dose of MMR vaccine.  Pneumococcal 13-valent conjugate (PCV13) vaccine. When indicated, a person who is uncertain of her immunization history and has no record of immunization should receive the PCV13 vaccine. An adult aged 19 years or older who has certain medical conditions and has not been previously immunized should receive 1 dose of PCV13 vaccine. This PCV13 should be followed with a dose of pneumococcal polysaccharide (PPSV23) vaccine. The PPSV23 vaccine dose should be obtained at least 8 weeks after the dose of PCV13 vaccine. An adult aged 19  years or older who has certain medical conditions and previously received 1 or more doses of PPSV23 vaccine should receive 1 dose of PCV13. The PCV13 vaccine dose should be obtained 1 or more years after the last PPSV23 vaccine dose.  Pneumococcal polysaccharide (PPSV23) vaccine. When PCV13 is also indicated, PCV13 should be obtained first. All adults aged 65 years and older should be immunized. An adult younger than age 65 years who has certain medical conditions should be immunized. Any person who resides in a nursing home or long-term care facility should be immunized. An adult smoker should be immunized. People with an immunocompromised condition and certain other conditions should receive both PCV13 and PPSV23 vaccines. People with human immunodeficiency virus (HIV) infection should be immunized as soon as possible after diagnosis. Immunization during chemotherapy or radiation therapy should be avoided. Routine use of PPSV23 vaccine is not recommended for American Indians, [place] Natives, or people younger than 65 years unless there are medical conditions that require PPSV23 vaccine. When indicated, people who have unknown immunization and have no record of immunization should receive PPSV23 vaccine. One-time revaccination 5 years after the first dose of PPSV23 is recommended for people aged 19-64 years who have chronic kidney failure, nephrotic syndrome, asplenia, or immunocompromised conditions. People who received 1-2 doses of PPSV23 before age 65 years should receive another dose of PPSV23 vaccine at age 65 years or later if at least 5 years have passed since the previous dose. Doses of PPSV23 are not needed for people immunized with PPSV23 at or after age 65 years.  Meningococcal vaccine. Adults with asplenia or persistent complement component deficiencies should receive 2 doses of quadrivalent meningococcal conjugate (MenACWY-D) vaccine. The doses should be obtained at least 2 months apart.  Microbiologists working with certain meningococcal bacteria, military recruits, people at risk during an outbreak, and people who travel to or live in countries with a high rate of meningitis should be immunized. A first-year college student up through age   21 years who is living in a residence hall should receive a dose if she did not receive a dose on or after her 16th birthday. Adults who have certain high-risk conditions should receive one or more doses of vaccine.  Hepatitis A vaccine. Adults who wish to be protected from this disease, have certain high-risk conditions, work with hepatitis A-infected animals, work in hepatitis A research labs, or travel to or work in countries with a high rate of hepatitis A should be immunized. Adults who were previously unvaccinated and who anticipate close contact with an international adoptee during the first 60 days after arrival in the [place] from a country with a high rate of hepatitis A should be immunized.  Hepatitis B vaccine. Adults who wish to be protected from this disease, have certain high-risk conditions, may be exposed to blood or other infectious body fluids, are household contacts or sex partners of hepatitis B positive people, are clients or workers in certain care facilities, or travel to or work in countries with a high rate of hepatitis B should be immunized.  Haemophilus influenzae type b (Hib) vaccine. A previously unvaccinated person with asplenia or sickle cell disease or having a scheduled splenectomy should receive 1 dose of Hib vaccine. Regardless of previous immunization, a recipient of a hematopoietic stem cell transplant should receive a 3-dose series 6-12 months after her successful transplant. Hib vaccine is not recommended for adults with HIV infection. Preventive Services / Frequency Ages 64 to 68 years  Blood pressure check.** / Every 1 to 2 years.  Lipid and cholesterol check.** / Every 5 years beginning at age  22.  Clinical breast exam.** / Every 3 years for women in their 88s and 53s.  BRCA-related cancer risk assessment.** / For women who have family members with a BRCA-related cancer (breast, ovarian, tubal, or peritoneal cancers).  Pap test.** / Every 2 years from ages 90 through 51. Every 3 years starting at age 21 through age 56 or 3 with a history of 3 consecutive normal Pap tests.  HPV screening.** / Every 3 years from ages 24 through ages 1 to 46 with a history of 3 consecutive normal Pap tests.  Hepatitis C blood test.** / For any individual with known risks for hepatitis C.  Skin self-exam. / Monthly.  Influenza vaccine. / Every year.  Tetanus, diphtheria, and acellular pertussis (Tdap, Td) vaccine.** / Consult your health care provider. Pregnant women should receive 1 dose of Tdap vaccine during each pregnancy. 1 dose of Td every 10 years.  Varicella vaccine.** / Consult your health care provider. Pregnant females who do not have evidence of immunity should receive the first dose after pregnancy.  HPV vaccine. / 3 doses over 6 months, if 72 and younger. The vaccine is not recommended for use in pregnant females. However, pregnancy testing is not needed before receiving a dose.  Measles, mumps, rubella (MMR) vaccine.** / You need at least 1 dose of MMR if you were born in 1957 or later. You may also need a 2nd dose. For females of childbearing age, rubella immunity should be determined. If there is no evidence of immunity, females who are not pregnant should be vaccinated. If there is no evidence of immunity, females who are pregnant should delay immunization until after pregnancy.  Pneumococcal 13-valent conjugate (PCV13) vaccine.** / Consult your health care provider.  Pneumococcal polysaccharide (PPSV23) vaccine.** / 1 to 2 doses if you smoke cigarettes or if you have certain conditions.  Meningococcal vaccine.** /  1 dose if you are age 19 to 21 years and a first-year college  student living in a residence hall, or have one of several medical conditions, you need to get vaccinated against meningococcal disease. You may also need additional booster doses.  Hepatitis A vaccine.** / Consult your health care provider.  Hepatitis B vaccine.** / Consult your health care provider.  Haemophilus influenzae type b (Hib) vaccine.** / Consult your health care provider. Ages 40 to 64 years  Blood pressure check.** / Every 1 to 2 years.  Lipid and cholesterol check.** / Every 5 years beginning at age 20 years.  Lung cancer screening. / Every year if you are aged 55-80 years and have a 30-pack-year history of smoking and currently smoke or have quit within the past 15 years. Yearly screening is stopped once you have quit smoking for at least 15 years or develop a health problem that would prevent you from having lung cancer treatment.  Clinical breast exam.** / Every year after age 40 years.  BRCA-related cancer risk assessment.** / For women who have family members with a BRCA-related cancer (breast, ovarian, tubal, or peritoneal cancers).  Mammogram.** / Every year beginning at age 40 years and continuing for as long as you are in good health. Consult with your health care provider.  Pap test.** / Every 3 years starting at age 30 years through age 65 or 70 years with a history of 3 consecutive normal Pap tests.  HPV screening.** / Every 3 years from ages 30 years through ages 65 to 70 years with a history of 3 consecutive normal Pap tests.  Fecal occult blood test (FOBT) of stool. / Every year beginning at age 50 years and continuing until age 75 years. You may not need to do this test if you get a colonoscopy every 10 years.  Flexible sigmoidoscopy or colonoscopy.** / Every 5 years for a flexible sigmoidoscopy or every 10 years for a colonoscopy beginning at age 50 years and continuing until age 75 years.  Hepatitis C blood test.** / For all people born from 1945 through  1965 and any individual with known risks for hepatitis C.  Skin self-exam. / Monthly.  Influenza vaccine. / Every year.  Tetanus, diphtheria, and acellular pertussis (Tdap/Td) vaccine.** / Consult your health care provider. Pregnant women should receive 1 dose of Tdap vaccine during each pregnancy. 1 dose of Td every 10 years.  Varicella vaccine.** / Consult your health care provider. Pregnant females who do not have evidence of immunity should receive the first dose after pregnancy.  Zoster vaccine.** / 1 dose for adults aged 60 years or older.  Measles, mumps, rubella (MMR) vaccine.** / You need at least 1 dose of MMR if you were born in 1957 or later. You may also need a 2nd dose. For females of childbearing age, rubella immunity should be determined. If there is no evidence of immunity, females who are not pregnant should be vaccinated. If there is no evidence of immunity, females who are pregnant should delay immunization until after pregnancy.  Pneumococcal 13-valent conjugate (PCV13) vaccine.** / Consult your health care provider.  Pneumococcal polysaccharide (PPSV23) vaccine.** / 1 to 2 doses if you smoke cigarettes or if you have certain conditions.  Meningococcal vaccine.** / Consult your health care provider.  Hepatitis A vaccine.** / Consult your health care provider.  Hepatitis B vaccine.** / Consult your health care provider.  Haemophilus influenzae type b (Hib) vaccine.** / Consult your health care provider. Ages 65   years and over  Blood pressure check.** / Every 1 to 2 years.  Lipid and cholesterol check.** / Every 5 years beginning at age 22 years.  Lung cancer screening. / Every year if you are aged 73-80 years and have a 30-pack-year history of smoking and currently smoke or have quit within the past 15 years. Yearly screening is stopped once you have quit smoking for at least 15 years or develop a health problem that would prevent you from having lung cancer  treatment.  Clinical breast exam.** / Every year after age 4 years.  BRCA-related cancer risk assessment.** / For women who have family members with a BRCA-related cancer (breast, ovarian, tubal, or peritoneal cancers).  Mammogram.** / Every year beginning at age 40 years and continuing for as long as you are in good health. Consult with your health care provider.  Pap test.** / Every 3 years starting at age 9 years through age 34 or 91 years with 3 consecutive normal Pap tests. Testing can be stopped between 65 and 70 years with 3 consecutive normal Pap tests and no abnormal Pap or HPV tests in the past 10 years.  HPV screening.** / Every 3 years from ages 57 years through ages 64 or 45 years with a history of 3 consecutive normal Pap tests. Testing can be stopped between 65 and 70 years with 3 consecutive normal Pap tests and no abnormal Pap or HPV tests in the past 10 years.  Fecal occult blood test (FOBT) of stool. / Every year beginning at age 15 years and continuing until age 17 years. You may not need to do this test if you get a colonoscopy every 10 years.  Flexible sigmoidoscopy or colonoscopy.** / Every 5 years for a flexible sigmoidoscopy or every 10 years for a colonoscopy beginning at age 86 years and continuing until age 71 years.  Hepatitis C blood test.** / For all people born from [birth year] through 1963 and any individual with known risks for hepatitis C.  Osteoporosis screening.** / A one-time screening for women ages 83 years and over and women at risk for fractures or osteoporosis.  Skin self-exam. / Monthly.  Influenza vaccine. / Every year.  Tetanus, diphtheria, and acellular pertussis (Tdap/Td) vaccine.** / 1 dose of Td every 10 years.  Varicella vaccine.** / Consult your health care provider.  Zoster vaccine.** / 1 dose for adults aged 61 years or older.  Pneumococcal 13-valent conjugate (PCV13) vaccine.** / Consult your health care provider.  Pneumococcal  polysaccharide (PPSV23) vaccine.** / 1 dose for all adults aged 28 years and older.  Meningococcal vaccine.** / Consult your health care provider.  Hepatitis A vaccine.** / Consult your health care provider.  Hepatitis B vaccine.** / Consult your health care provider.  Haemophilus influenzae type b (Hib) vaccine.** / Consult your health care provider. ** Family history and personal history of risk and conditions may change your health care provider's recommendations. Document Released: 05/01/2001 Document Revised: 07/20/2013 Document Reviewed: 07/31/2010 [facility] Patient Information 2015 [place], [place]. This information is not intended to replace advice given to you by your health care provider. Make sure you discuss any questions you have with your health care provider.

## 2013-12-07 NOTE — Assessment & Plan Note (Signed)
Getting cmp, cbc, lipid panel, stool cards x 3 for blood. Did ekg today. See the beginning of my note for routine eval/ wellness issues.

## 2013-12-07 NOTE — Progress Notes (Signed)
Subjective:    Patient ID: Felicia Martin, female    DOB: [birth year], 80 y.o.   MRN: [id number]  HPI  Pt last bone density was done 2011. Pt last papsmear years ago. No longer gets. Pt has hyperlipidemia. Last done 03-18-2013. LDL. 230.7.  Can't tolerate statins. Pt last mammogram done 05-22-2013. Normal. Pt declines colonscopy. She states only check stools for blood. Pt BMI has been 39.17. Pt had ct of chest in 2009 for smoking hx and asthma. She does not desire. Further. Followed up by pulmonoligist. He did not repeat ct.  Pt declines fluvaccination Decline shingles vaccine in past as well. Declines pneumonia vaccine as well. Pt declines tetanus vaccine. Pt decline further dexascan.   Her bp is elevated. No head ache, no neuro signs or symptom, No chest pain. Sytolic 017-510. Diastolic 25-85. These are her normal ranges.  Frequent urination for 2 weeks. Getting up at night. No fever, No uti type symptoms.   Review of Systems See smart set ros  Objective:   Physical Exam  See smart set PE      Assessment & Plan:   Subjective:    Felicia Martin is a 80 y.o. female who presents for Medicare Annual/Subsequent preventive examination.  Preventive Screening-Counseling & Management  Tobacco History  Smoking status  . Former Smoker -- 0.50 packs/day for 40 years  . Types: Cigarettes  . Quit date: 05/25/1999  Smokeless tobacco  . Never Used     Problems Prior to Visit 1. Elevated bp, frequent urination and hyperlipidemia.  Current Problems (verified) Patient Active Problem List   Diagnosis Date Noted  . CANDIDIASIS, VULVAR 05/12/2010  . UTI 05/04/2010  . DEGENERATIVE JOINT DISEASE 09/22/2008  . DYSPHAGIA UNSPECIFIED 09/22/2008  . OSTEOPENIA 11/05/2007  . TELANGIECTASIA 05/29/2007  . HYPERCHOLESTEROLEMIA 02/01/2007  . PULMONARY FIBROSIS 02/01/2007  . SARCOIDOSIS 11/20/2006  . ASTHMA 11/20/2006    Medications Prior to Visit Current Outpatient  Prescriptions on File Prior to Visit  Medication Sig Dispense Refill  . albuterol (PROVENTIL HFA;VENTOLIN HFA) 108 (90 BASE) MCG/ACT inhaler Inhale 2 puffs into the lungs every 6 (six) hours as needed.  1 Inhaler  1  . aspirin 81 MG tablet Take 81 mg by mouth daily.      . beclomethasone (QVAR) 40 MCG/ACT inhaler Inhale 2 puffs into the lungs 2 (two) times daily.  1 Inhaler  12  . Calcium Carbonate-Vitamin D (CALCIUM-VITAMIN D) 500-200 MG-UNIT per tablet Take 1 tablet by mouth 2 (two) times daily with a meal.       No current facility-administered medications on file prior to visit.    Current Medications (verified) Current Outpatient Prescriptions  Medication Sig Dispense Refill  . albuterol (PROVENTIL HFA;VENTOLIN HFA) 108 (90 BASE) MCG/ACT inhaler Inhale 2 puffs into the lungs every 6 (six) hours as needed.  1 Inhaler  1  . aspirin 81 MG tablet Take 81 mg by mouth daily.      . beclomethasone (QVAR) 40 MCG/ACT inhaler Inhale 2 puffs into the lungs 2 (two) times daily.  1 Inhaler  12  . Calcium Carbonate-Vitamin D (CALCIUM-VITAMIN D) 500-200 MG-UNIT per tablet Take 1 tablet by mouth 2 (two) times daily with a meal.       No current facility-administered medications for this visit.     Allergies (verified) Review of patient's allergies indicates no known allergies.   PAST HISTORY  Family History Family History  Problem Relation Age of Onset  . Hypertension Sister  x's 3  . Diabetes Sister     x's 3  . Hyperlipidemia    . Liver cancer Mother   . Liver cancer Father   . Cancer Father   . Cancer Brother   . Diabetes Brother     Social History History  Substance Use Topics  . Smoking status: Former Smoker -- 0.50 packs/day for 40 years    Types: Cigarettes    Quit date: 05/25/1999  . Smokeless tobacco: Never Used  . Alcohol Use: No     Are there smokers in your home (other than you)? No  Risk Factors Current exercise habits: Pt goes to y at least 3 times a week.    Dietary issues discussed: Pt diet is moderate healthy. Eats some fruits and vegetables.   Cardiac risk factors: advanced age (older than 64 for men, 47 for women). Pt has high cholesterol. Htn.    Depression Screen (Note: if answer to either of the following is "Yes", a more complete depression screening is indicated)   Over the past two weeks, have you felt down, depressed or hopeless? No  Over the past two weeks, have you felt little interest or pleasure in doing things? No  Have you lost interest or pleasure in daily life? No  Do you often feel hopeless? No  Do you cry easily over simple problems? No  Activities of Daily Living In your present state of health, do you have any difficulty performing the following activities?:  Driving? No Managing money?  No Feeding yourself? Yes Getting from bed to chair? No. No problems reported. Climbing a flight of stairs? No Preparing food and eating?: No Bathing or showering? No Getting dressed: No Getting to the toilet? No Using the toilet:No Moving around from place to place: No In the past year have you fallen or had a near fall?:No   Are you sexually active?  No  Do you have more than one partner?  No  Hearing Difficulties: Yes Do you often ask people to speak up or repeat themselves? No Do you experience ringing or noises in your ears? No Do you have difficulty understanding soft or whispered voices? Yes. Pt does have hearing aids. Not using today.   Do you feel that you have a problem with memory? No. Only rare problems per her report.  Do you often misplace items? No  Do you feel safe at home?  Yes  Cognitive Testing  Alert? Yes  Normal Appearance?Yes  Oriented to person? Yes  Place? Yes   Time? Yes  Recall of three objects?  Yes  Can perform simple calculations? Yes  Displays appropriate judgment?Yes  Can read the correct time from a watch face?Yes   Advanced Directives have been discussed with the patient? Yes  List  the Names of Other Physician/Practitioners you currently use: 1.  Eye MD, audiologist, and Dr. Etter Sjogren.  Indicate any recent Medical Services you may have received from other than [facility] providers in the past year (date may be approximate).  Immunization History  Administered Date(s) Administered  . Td 03/19/2002    Screening Tests Health Maintenance  Topic Date Due  . Tetanus/tdap  03/19/2012  . Influenza Vaccine  10/17/2013  . Mammogram  05/22/2014  . Colonoscopy  05/24/2021  . Pneumococcal Polysaccharide Vaccine Age 35 And Over  Addressed  . Zostavax  Addressed    All answers were reviewed with the patient and necessary referrals were made:  Mackie Pai, Hershal Coria   12/07/2013  History reviewed: allergies, current medications, past family history, past medical history, past social history, past surgical history and problem list  Review of Systems A comprehensive review of systems was negative. Except frequent urination.   Objective:    Vision by Snellen chart: right VZD:GLOVFIE declines measurement, left PPI:RJJOACZ declines measurement  Body mass index is 33.67 kg/(m^2). BP 171/95  Pulse 70  Temp(Src) 98.4 F (36.9 C)  Ht 5' 5.75" (1.67 m)  Wt 207 lb (93.895 kg)  BMI 33.67 kg/m2  SpO2 96%  General   Mental Status- Alert.  Orientation-Oriented x3. Build and Nutrition Well Nourished and Well Developed.  Skin General: Normal.  Color- Normal color. Moisture- Dry.Temperature warm. Lesions: No suspicious lesions  Head, Eyes, Ears, Nose, Thoat Ears-Normal. Auditory Canal-Bilateral-Normal. Tympanic Membrane- Bilateral-Normal. Eyes . Pupil- Bilateral- Direct reaction to light normal. opthalmoscope not done(pt has cataracts) Nose & Sinuses- Normal. Nostril- Bilateral-Normal.  Neck Neck- No Bruits or Masses. Thyroid- Normal. No thyromegaly or nodules.   Chest and Lung Exam  Percussion: Quality and Intensity:-Percussion normal. Percussion of chest reveals- No  Dullness. Palpation of the chest reveals- Non-tender. Auscultation: Breath sounds-Normal. Adventitious  Sounds:No adventitious  . Cardiovascular Inspection: No Heaves. Auscultation: Heart Sounds- Normal sinus rhythm without murmur or gallop, S1 WNL and S2 WNL.  Abdomen Inspection:- Inspection Normal. Inspection of abdomen reveals- No Hernias. Palpation/Percussion: Palpation and Percussion of the abdomen reveal- Non Tender and No Palpable masses. Liver: Other Characteristics- No Hepatmegaly Spleen:Other Characteristics- No Splenomegaly. Auscultation: Auscultation of the abdomen reveals-Bowel sounds normal and No Abdominal bruits.   Neurologic Mental Status- Normal Cranial Nerves- Normal Bilaterally, Motor- Normal. Strength:5/5 normal muscle strength- All Muscles. General Assessment of Reflexes- Right Knee- 2+. Left Knee- 2+. Coordination- Normal. Gait- Normal. Meningeal Signs- None.  Musculoskeletal Global Assessment General- Joints show full range of motion without obvious deformity and Normal muscle mass. Strength 5/5 in upper and lower extremities.  Lymphatic General lymphatics Description-No Generalized lymphadenopathy.        Assessment:    This is a routine wellness  examination for this patient . I reviewed all health maintenance protocols including mammography, colonoscopy, bone density Needed referrals were placed. Age and diagnosis  appropriate screening labs were ordered. Her immunization history was reviewed and appropriate vaccinations were ordered. Her current medications and allergies were reviewed and needed refills of her chronic medications were ordered. The plan for yearly health maintenance was discussed all orders and referrals were made as appropriate.     Plan:    During the course of the visit the patient was educated and counseled about appropriate screening and preventive services including:    Screening electrocardiogram  Screening  mammography  Diet review for nutrition referral? Yes __x__  Not Indicated ____   Patient Instructions (the written plan) was given to the patient.  Medicare Attestation I have personally reviewed: The patient's medical and social history Their use of alcohol, tobacco or illicit drugs Their current medications and supplements The patient's functional ability including ADLs,fall risks, home safety risks, cognitive, and hearing and visual impairment Diet and physical activities Evidence for depression or mood disorders  The patient's weight, height, BMI, and visual acuity have been recorded in the chart.  I have made referrals, counseling, and provided education to the patient based on review of the above and I have provided the patient with a written personalized care plan for preventive services.     Mackie Pai, PA-C   12/07/2013

## 2013-12-07 NOTE — Assessment & Plan Note (Signed)
Check bp and bring in log. When in for fasting lab I want her to get her bp checked that day. May put on low dose agent when she is in for labs. This was discussed with the patient.

## 2013-12-07 NOTE — Assessment & Plan Note (Signed)
Along with wellness exam did discuss this today as well. Last ekg done years ago so will get one today. And put in fasting future labs.

## 2013-12-16 ENCOUNTER — Telehealth: Payer: Self-pay | Admitting: *Deleted

## 2013-12-16 ENCOUNTER — Other Ambulatory Visit (INDEPENDENT_AMBULATORY_CARE_PROVIDER_SITE_OTHER): Payer: Medicare Other

## 2013-12-16 DIAGNOSIS — E7800 Pure hypercholesterolemia, unspecified: Secondary | ICD-10-CM

## 2013-12-16 DIAGNOSIS — IMO0001 Reserved for inherently not codable concepts without codable children: Secondary | ICD-10-CM

## 2013-12-16 DIAGNOSIS — R030 Elevated blood-pressure reading, without diagnosis of hypertension: Secondary | ICD-10-CM

## 2013-12-16 LAB — CBC WITH DIFFERENTIAL/PLATELET
Basophils Absolute: 0 10*3/uL (ref 0.0–0.1)
Basophils Relative: 0.8 % (ref 0.0–3.0)
EOS ABS: 0.3 10*3/uL (ref 0.0–0.7)
Eosinophils Relative: 5.9 % — ABNORMAL HIGH (ref 0.0–5.0)
HCT: 36.7 % (ref 36.0–46.0)
Hemoglobin: 12.2 g/dL (ref 12.0–15.0)
Lymphocytes Relative: 41.4 % (ref 12.0–46.0)
Lymphs Abs: 1.8 10*3/uL (ref 0.7–4.0)
MCHC: 33.3 g/dL (ref 30.0–36.0)
MCV: 92 fl (ref 78.0–100.0)
Monocytes Absolute: 0.4 10*3/uL (ref 0.1–1.0)
Monocytes Relative: 9 % (ref 3.0–12.0)
NEUTROS PCT: 42.9 % — AB (ref 43.0–77.0)
Neutro Abs: 1.9 10*3/uL (ref 1.4–7.7)
Platelets: 279 10*3/uL (ref 150.0–400.0)
RBC: 3.99 Mil/uL (ref 3.87–5.11)
RDW: 13.3 % (ref 11.5–15.5)
WBC: 4.4 10*3/uL (ref 4.0–10.5)

## 2013-12-16 LAB — COMPREHENSIVE METABOLIC PANEL
ALBUMIN: 3.9 g/dL (ref 3.5–5.2)
ALT: 11 U/L (ref 0–35)
AST: 19 U/L (ref 0–37)
Alkaline Phosphatase: 70 U/L (ref 39–117)
BUN: 21 mg/dL (ref 6–23)
CO2: 26 mEq/L (ref 19–32)
Calcium: 9.3 mg/dL (ref 8.4–10.5)
Chloride: 104 mEq/L (ref 96–112)
Creatinine, Ser: 0.8 mg/dL (ref 0.4–1.2)
GFR: 86.51 mL/min (ref >60.00)
Glucose, Bld: 101 mg/dL — ABNORMAL HIGH (ref 70–99)
POTASSIUM: 4.5 meq/L (ref 3.5–5.1)
Sodium: 136 mEq/L (ref 135–145)
Total Bilirubin: 0.6 mg/dL (ref 0.2–1.2)
Total Protein: 8.6 g/dL — ABNORMAL HIGH (ref 6.0–8.3)

## 2013-12-16 LAB — LIPID PANEL
CHOL/HDL RATIO: 5
Cholesterol: 293 mg/dL — ABNORMAL HIGH (ref 0–200)
HDL: 59.5 mg/dL (ref >39.00)
LDL CALC: 212 mg/dL — AB (ref 0–99)
NonHDL: 233.5
TRIGLYCERIDES: 110 mg/dL (ref 0.0–149.0)
VLDL: 22 mg/dL (ref 0.0–40.0)

## 2013-12-16 NOTE — Telephone Encounter (Addendum)
Pt came in for blood draw and while here she asked to have her BP checked.  Informed the pt that I could not check her BP in the lab.  Pt stated that she was asked to bring in a BP log.  Pt said that she had the log when she left her house, but she has lost it from the time she got out of her car and coming into the office.  Pt stated that she will give me her BP reading from this morning.  At 7:00am(160/70) then she took it 88mins later and it was (140/71).   After the pt left I read in the last office note that the pt was to have her BP taking while here for her bloodwork.  This was not done.  Verbally informed Percell Miller of this and he would like for the pt to come back for a nurse visit to have her BP check, and bring her BP log.  Called and spoke with the pt and informed her of Edward's recommendation to come back and have her BP checked and bring a BP log.  Pt understood and agreed.  Pt scheduled a nurse visit for Friday (12/18/13 @ 2:30pm.//AB/CMA

## 2013-12-17 MED ORDER — SULFAMETHOXAZOLE-TMP DS 800-160 MG PO TABS: 1.0000 | ORAL_TABLET | Freq: Two times a day (BID) | ORAL | Status: DC

## 2013-12-17 NOTE — Telephone Encounter (Signed)
Patient notified of UC and medication sent to pharmacy.

## 2013-12-17 NOTE — Telephone Encounter (Signed)
Urine culture results showed low count of bacteria e coli at 25,000. Will send 3 days of antibiotic. Get LPN to call pt and notify that I sent prescription to her pharmacy.

## 2013-12-17 NOTE — Telephone Encounter (Addendum)
Patient notified. Will get medication from pharmacy.

## 2013-12-18 ENCOUNTER — Telehealth: Payer: Self-pay | Admitting: Medical

## 2013-12-18 MED ORDER — AMLODIPINE BESYLATE 2.5 MG PO TABS: 2.5000 mg | ORAL_TABLET | Freq: Every day | ORAL | Status: DC

## 2013-12-18 NOTE — Telephone Encounter (Signed)
Based on her last bp with me and her 2 readings that she had elevated recently will give very low dose amlodipine 2.5 mg po q day. Will send that to pharmacy. Ask lpn to call pt and notify. Then have pt come in for bp check in 1 month.

## 2013-12-18 NOTE — Telephone Encounter (Signed)
Patient states I called her regarding this. Staes she picked up medication yesterday and started. States she was told to come today and have BP checked. I told her that she also needs to come in in 1 month for BP check.

## 2013-12-28 ENCOUNTER — Ambulatory Visit (INDEPENDENT_AMBULATORY_CARE_PROVIDER_SITE_OTHER): Payer: Medicare Other | Admitting: Family

## 2013-12-28 ENCOUNTER — Encounter: Payer: Self-pay | Admitting: Family

## 2013-12-28 VITALS — BP 130/74 | HR 80 | Temp 98.2°F | Resp 16 | Ht 60.0 in | Wt 206.0 lb

## 2013-12-28 DIAGNOSIS — B3731 Acute candidiasis of vulva and vagina: Secondary | ICD-10-CM

## 2013-12-28 DIAGNOSIS — B373 Candidiasis of vulva and vagina: Secondary | ICD-10-CM

## 2013-12-28 MED ORDER — FLUCONAZOLE 150 MG PO TABS: 150.0000 mg | ORAL_TABLET | Freq: Every day | ORAL | Status: DC

## 2013-12-28 NOTE — Patient Instructions (Signed)
Thank you for choosing [facility].  Summary/Instructions:   Your prescription has been sent to the pharmacy  If you symptoms worsen or fail to improve, please let us know.  It was a pleasure to meet you today.

## 2013-12-28 NOTE — Assessment & Plan Note (Signed)
Recent UTI treated with antibiotics. Start Diflucan. Instructed to follow up if symptoms worsen or fail to improve.

## 2013-12-28 NOTE — Progress Notes (Signed)
Pre visit review using our clinic review tool, if applicable. No additional management support is needed unless otherwise documented below in the visit note. 

## 2013-12-28 NOTE — Progress Notes (Signed)
   Subjective:    Patient ID: Felicia Martin, female    DOB: 10-02-1932, 80 y.o.   MRN: [id number]  HPI:  Felicia Martin is a 80 y.o. female who presents today for a vaginal itch. Indicates it started a week ago with itching that has kept her up at night. She tried OTC Vagisil which helped briefly but the itching came back twice as strong. Denies any discharge or recent unprotected intercourse. Denies fevers, chills, or abdominal pain.Marland Kitchen  No Known Allergies  Current Outpatient Prescriptions on File Prior to Visit  Medication Sig Dispense Refill  . albuterol (PROVENTIL HFA;VENTOLIN HFA) 108 (90 BASE) MCG/ACT inhaler Inhale 2 puffs into the lungs every 6 (six) hours as needed.  1 Inhaler  1  . amLODipine (NORVASC) 2.5 MG tablet Take 1 tablet (2.5 mg total) by mouth daily.  30 tablet  1  . aspirin 81 MG tablet Take 81 mg by mouth daily.      . beclomethasone (QVAR) 40 MCG/ACT inhaler Inhale 2 puffs into the lungs 2 (two) times daily.  1 Inhaler  12  . Calcium Carbonate-Vitamin D (CALCIUM-VITAMIN D) 500-200 MG-UNIT per tablet Take 1 tablet by mouth 2 (two) times daily with a meal.       No current facility-administered medications on file prior to visit.   Past Medical History  Diagnosis Date  . Pulmonary fibrosis   . Sarcoidosis   . Asthma   . Hypercholesterolemia     Review of Systems  See HPI.    Objective:     BP 130/74  Pulse 80  Temp(Src) 98.2 F (36.8 C) (Oral)  Resp 16  Ht 5' (1.524 m)  Wt 206 lb (93.441 kg)  BMI 40.23 kg/m2  SpO2 94% Nursing note and vital signs reviewed.  Physical Exam  Constitutional: She is oriented to person, place, and time. She appears well-developed and well-nourished.  Cardiovascular: Normal rate, regular rhythm and normal heart sounds.   Pulmonary/Chest: Effort normal and breath sounds normal.  Neurological: She is alert and oriented to person, place, and time.  Skin: Skin is warm and dry.  Psychiatric: She has a normal mood and  affect. Her behavior is normal. Judgment and thought content normal.      Assessment & Plan:

## 2013-12-29 ENCOUNTER — Other Ambulatory Visit: Payer: Medicare Other

## 2013-12-31 ENCOUNTER — Encounter: Payer: Self-pay | Admitting: Family Medicine

## 2013-12-31 ENCOUNTER — Ambulatory Visit (INDEPENDENT_AMBULATORY_CARE_PROVIDER_SITE_OTHER): Payer: Medicare Other | Admitting: Family Medicine

## 2013-12-31 VITALS — BP 128/78 | HR 96 | Temp 98.2°F | Resp 16 | Wt 206.0 lb

## 2013-12-31 DIAGNOSIS — B3731 Acute candidiasis of vulva and vagina: Secondary | ICD-10-CM

## 2013-12-31 DIAGNOSIS — B373 Candidiasis of vulva and vagina: Secondary | ICD-10-CM

## 2013-12-31 DIAGNOSIS — R319 Hematuria, unspecified: Secondary | ICD-10-CM

## 2013-12-31 DIAGNOSIS — R102 Pelvic and perineal pain: Secondary | ICD-10-CM

## 2013-12-31 LAB — POCT URINALYSIS DIPSTICK
Bilirubin, UA: NEGATIVE
Glucose, UA: NEGATIVE
Ketones, UA: NEGATIVE
LEUKOCYTES UA: NEGATIVE
NITRITE UA: NEGATIVE
PH UA: 6
Protein, UA: NEGATIVE
Spec Grav, UA: >=1.03
Urobilinogen, UA: 0.2

## 2013-12-31 MED ORDER — NYSTATIN-TRIAMCINOLONE 100000-0.1 UNIT/GM-% EX OINT: 1.0000 "application " | TOPICAL_OINTMENT | Freq: Two times a day (BID) | CUTANEOUS | Status: DC

## 2013-12-31 MED ORDER — FLUCONAZOLE 100 MG PO TABS: 100.0000 mg | ORAL_TABLET | Freq: Every day | ORAL | Status: DC

## 2013-12-31 NOTE — Patient Instructions (Signed)
Follow up as needed Apply the cream in a thin layer to the itchy areas twice daily until symptoms improve Take the Diflucan daily x7 days Try and avoid scratching! Call with any questions or concerns Hang in there!!

## 2013-12-31 NOTE — Progress Notes (Signed)
   Subjective:    Patient ID: Felicia Martin, female    DOB: 09/14/1932, 80 y.o.   MRN: [id number]  HPI Rash- pt was seen on 9/21 and had 25,000 colonies of E Coli.  Was started on 3 days of Cipro on 10/1.  Pt took the med and developed a rash.  Saw Felicia Martin and was treated w/ 3 days of Diflucan for yeast infection.  Rash is not better.  Yesterday again developed burning w/ urination.  Pt feels this is due to scratching the rash.  Increased urinary frequency, increased urgency.  Some urinary hesitancy and feelings of incomplete emptying.  Pt having increased itching in groin creases and on mons pubis.  No obvious rash per pt report.   Review of Systems For ROS see HPI     Objective:   Physical Exam  Vitals reviewed. Constitutional: She appears well-developed and well-nourished. No distress.  Skin: Skin is warm and dry. There is erythema (w/ excoriations over groin creases bilaterally, labia majora, perineum, and mons pubis).          Assessment & Plan:

## 2013-12-31 NOTE — Assessment & Plan Note (Signed)
Deteriorated.  No improvement w/ 3 days of Flagyl.  Pt now w/ excoriations and severe itching.  Start Diflucan x7 days along w/ topical mycolog.  Reviewed supportive care and red flags that should prompt return.  Pt expressed understanding and is in agreement w/ plan.

## 2014-01-01 LAB — URINE CULTURE

## 2014-01-04 ENCOUNTER — Telehealth: Payer: Self-pay | Admitting: Family Medicine

## 2014-01-04 NOTE — Telephone Encounter (Signed)
Pt returning your call

## 2014-01-04 NOTE — Telephone Encounter (Signed)
Pt notified of results

## 2014-01-20 ENCOUNTER — Encounter: Payer: Medicare Other | Admitting: Family Medicine

## 2014-03-05 ENCOUNTER — Encounter: Payer: Self-pay | Admitting: Medical

## 2014-03-05 ENCOUNTER — Ambulatory Visit (INDEPENDENT_AMBULATORY_CARE_PROVIDER_SITE_OTHER): Payer: Medicare Other | Admitting: Medical

## 2014-03-05 VITALS — BP 155/89 | HR 94 | Temp 98.0°F | Ht 61.0 in | Wt 208.8 lb

## 2014-03-05 DIAGNOSIS — E7800 Pure hypercholesterolemia, unspecified: Secondary | ICD-10-CM

## 2014-03-05 DIAGNOSIS — I10 Essential (primary) hypertension: Secondary | ICD-10-CM | POA: Insufficient documentation

## 2014-03-05 MED ORDER — AMLODIPINE BESYLATE 2.5 MG PO TABS: 2.5000 mg | ORAL_TABLET | Freq: Every day | ORAL | Status: DC

## 2014-03-05 NOTE — Patient Instructions (Addendum)
For your hypertension, I will prescribe low dose amlodipine again. Will give refills for 3 months. Please check you bp like you did last time and if not controlling your bp then return to clinic.  For your cholesterol which is chronically high, I would recommend diet and exercise since you report side effects to at least 3 medications in the past.  For your wheezing, continue the albuterol but use you qvar as well and please use for at least 2 weeks. If you have worsening respiratory symptoms notify us.  Follow up in 3 months or as needed.  In 3 months come in fasting so we can get cmp with lipid panel.

## 2014-03-05 NOTE — Assessment & Plan Note (Signed)
For your wheezing, continue the albuterol but use you qvar as well and please use for at least 2 weeks. If you have worsening respiratory symptoms notify us.

## 2014-03-05 NOTE — Assessment & Plan Note (Signed)
For your cholesterol which is chronically high, I would recommend diet and exercise since you report side effects to at least 3 medications in the past.

## 2014-03-05 NOTE — Progress Notes (Signed)
Subjective:    Patient ID: Felicia Martin, female    DOB: 12-15-1932, 80 y.o.   MRN: [id number]  HPI   Pt in for bp check. Pt states in the past when she just on amlodipine 2.5 mg she tells me that her blood pressure was always controlled. Usually her systolic was in low 614'E and diastolic were mid 70. Felt fine and no side effects. Pt stopped the medicine after one month and never followed up.   Pt states before exercise the other day her bp was 196/81. Then came down after exercise. Came down to 140/70.  Pt had no cardiac or neurologic signs or symptoms.  Pt has elevated cholesterol in the past. Pt is watching her lipids. Pt states prior side effects. She had myalgias. Tried crestor. Declined others. Other side effects to other.    Last week with weather changes cold to hot.She got wheezy. Using albuterol. Using 2-3 times a day. No fever, no chills, and no productive cough. This came on at tail end of the cold.   Past Medical History  Diagnosis Date  . Pulmonary fibrosis   . Sarcoidosis   . Asthma   . Hypercholesterolemia     History   Social History  . Marital Status: Single    Spouse Name: N/A    Number of Children: N/A  . Years of Education: N/A   Occupational History  . [employer]    Social History Main Topics  . Smoking status: Former Smoker -- 0.50 packs/day for 40 years    Types: Cigarettes    Quit date: 05/25/1999  . Smokeless tobacco: Never Used  . Alcohol Use: No  . Drug Use: No  . Sexual Activity: No   Other Topics Concern  . Not on file   Social History Narrative    Past Surgical History  Procedure Laterality Date  . Lung surgery  1960    Right lung    Family History  Problem Relation Age of Onset  . Hypertension Sister     x's 3  . Diabetes Sister     x's 3  . Hyperlipidemia    . Liver cancer Mother   . Liver cancer Father   . Cancer Father   . Cancer Brother   . Diabetes Brother     No Known Allergies  Current Outpatient  Prescriptions on File Prior to Visit  Medication Sig Dispense Refill  . albuterol (PROVENTIL HFA;VENTOLIN HFA) 108 (90 BASE) MCG/ACT inhaler Inhale 2 puffs into the lungs every 6 (six) hours as needed. 1 Inhaler 1  . amLODipine (NORVASC) 2.5 MG tablet Take 1 tablet (2.5 mg total) by mouth daily. 30 tablet 1  . aspirin 81 MG tablet Take 81 mg by mouth daily.    . beclomethasone (QVAR) 40 MCG/ACT inhaler Inhale 2 puffs into the lungs 2 (two) times daily. 1 Inhaler 12  . Calcium Carbonate-Vitamin D (CALCIUM-VITAMIN D) 500-200 MG-UNIT per tablet Take 1 tablet by mouth 2 (two) times daily with a meal.    . fluconazole (DIFLUCAN) 100 MG tablet Take 1 tablet (100 mg total) by mouth daily. 7 tablet 0  . nystatin-triamcinolone ointment (MYCOLOG) Apply 1 application topically 2 (two) times daily. 60 g 0   No current facility-administered medications on file prior to visit.    BP 155/89 mmHg  Pulse 94  Temp(Src) 98 F (36.7 C) (Oral)  Ht 5\' 1"  (1.549 m)  Wt 208 lb 12.8 oz (94.711 kg)  BMI 39.47 kg/m2  SpO2 95%     Review of Systems  Constitutional: Negative for fever, chills, diaphoresis, activity change and fatigue.  Respiratory: Negative for cough, chest tightness and shortness of breath.   Cardiovascular: Negative for chest pain, palpitations and leg swelling.  Gastrointestinal: Negative for nausea, vomiting and abdominal pain.  Musculoskeletal: Negative for neck pain and neck stiffness.  Neurological: Negative for dizziness, tremors, seizures, syncope, facial asymmetry, speech difficulty, weakness, light-headedness, numbness and headaches.  Psychiatric/Behavioral: Negative for behavioral problems, confusion and agitation. The patient is not nervous/anxious.        Objective:   Physical Exam   General Mental Status- Alert. General Appearance- Not in acute distress.   Skin General: Color- Normal Color. Moisture- Normal Moisture.  Neck Carotid Arteries- Normal color. Moisture-  Normal Moisture. No carotid bruits. No JVD.  Chest and Lung Exam Auscultation: Breath Sounds:-even unlabored faint expiratory wheeze.  Cardiovascular Auscultation:Rythm- Regular. Murmurs & Other Heart Sounds:Auscultation of the heart reveals- No Murmurs.  Abdomen Inspection:-Inspeection Normal. Palpation/Percussion:Note:No mass. Palpation and Percussion of the abdomen reveal- Non Tender, Non Distended + BS, no rebound or guarding.    Neurologic Cranial Nerve exam:- CN III-XII intact(No nystagmus), symmetric smile. Drift Test:- No drift. Romberg Exam:- Negative.  Finger to Nose:- Normal/Intact Strength:- 5/5 equal and symmetric strength both upper and lower extremities.        Assessment & Plan:

## 2014-03-05 NOTE — Progress Notes (Signed)
Pre visit review using our clinic review tool, if applicable. No additional management support is needed unless otherwise documented below in the visit note. 

## 2014-03-05 NOTE — Assessment & Plan Note (Signed)
For your hypertension, I will prescribe low dose amlodipine again. Will give refills for 3 months. Please check you bp like you did last time and if not controlling your bp then return to clinic.Follow up in 3 months or as needed.  In 3 months come in fasting so we can get cmp with lipid panel.

## 2014-03-15 ENCOUNTER — Telehealth: Payer: Self-pay

## 2014-03-16 ENCOUNTER — Other Ambulatory Visit: Payer: Self-pay

## 2014-03-16 ENCOUNTER — Telehealth: Payer: Self-pay | Admitting: Family Medicine

## 2014-03-16 MED ORDER — FLUCONAZOLE 100 MG PO TABS: 100.0000 mg | ORAL_TABLET | Freq: Every day | ORAL | Status: DC

## 2014-03-16 NOTE — Telephone Encounter (Signed)
Error/gd °

## 2014-03-16 NOTE — Telephone Encounter (Signed)
Patient scheduled to come in for follow up 03/17/14.

## 2014-03-17 ENCOUNTER — Encounter: Payer: Self-pay | Admitting: Medical

## 2014-03-17 ENCOUNTER — Other Ambulatory Visit (HOSPITAL_COMMUNITY)
Admission: RE | Admit: 2014-03-17 | Discharge: 2014-03-17 | Disposition: A | Discharge status: Home / Self Care | Payer: Medicare Other | Source: Ambulatory Visit | Attending: Medical | Admitting: Medical

## 2014-03-17 ENCOUNTER — Ambulatory Visit (INDEPENDENT_AMBULATORY_CARE_PROVIDER_SITE_OTHER): Payer: Medicare Other | Admitting: Medical

## 2014-03-17 VITALS — BP 150/84 | HR 70 | Temp 98.5°F | Ht 61.0 in | Wt 206.0 lb

## 2014-03-17 DIAGNOSIS — N760 Acute vaginitis: Secondary | ICD-10-CM | POA: Diagnosis present

## 2014-03-17 DIAGNOSIS — R030 Elevated blood-pressure reading, without diagnosis of hypertension: Secondary | ICD-10-CM

## 2014-03-17 DIAGNOSIS — R300 Dysuria: Secondary | ICD-10-CM | POA: Insufficient documentation

## 2014-03-17 DIAGNOSIS — IMO0001 Reserved for inherently not codable concepts without codable children: Secondary | ICD-10-CM

## 2014-03-17 LAB — POCT URINALYSIS DIPSTICK
BILIRUBIN UA: NEGATIVE
Blood, UA: NEGATIVE
Glucose, UA: NEGATIVE
KETONES UA: NEGATIVE
LEUKOCYTES UA: NEGATIVE
Nitrite, UA: NEGATIVE
PH UA: 6
Protein, UA: NEGATIVE
Spec Grav, UA: 1.025
Urobilinogen, UA: 4

## 2014-03-17 NOTE — Patient Instructions (Signed)
For your htn, I do want you to restart the low dose amlodipine. I do not think this caused your urinary symptoms or any yeast infection.  We will get ua, urine culture and urine ancillary studies today. Continue the diflucan until for one more day.   Follow up in 7 days any urinary or gynecologic symptoms or as needed.

## 2014-03-17 NOTE — Assessment & Plan Note (Signed)
I  do not think this caused your urinary symptoms or any yeast infection.  We will get ua, urine culture and urine ancillary studies today. Continue the diflucan until for one more day.

## 2014-03-17 NOTE — Assessment & Plan Note (Signed)
For your htn, I do want you to restart the low dose amlodipine.

## 2014-03-17 NOTE — Progress Notes (Signed)
Pre visit review using our clinic review tool, if applicable. No additional management support is needed unless otherwise documented below in the visit note. 

## 2014-03-17 NOTE — Progress Notes (Signed)
   Subjective:    Patient ID: Felicia Martin, female    DOB: 06-May-1932, 80 y.o.   MRN: [id number]  HPI   Pt called in and she thought she had yeast infection. Pt states she feels better with less itch. She took one tablet yesterday. And one tablet today. She only hurts when she urinates now.  Pt in past got nystatin and triamcinolone. But that was too expensive.  Pt thought amlodipine was cause of possible yeast infection. Pt have not been on bp medication for 2 days. No other percieved side effects.    Review of Systems  Constitutional: Negative for fever, chills, diaphoresis, activity change and fatigue.  Respiratory: Negative for cough, chest tightness and shortness of breath.   Cardiovascular: Negative for chest pain, palpitations and leg swelling.  Gastrointestinal: Negative for nausea, vomiting and abdominal pain.  Genitourinary: Positive for dysuria. Negative for urgency, frequency, hematuria, flank pain, enuresis, genital sores and pelvic pain.  Musculoskeletal: Negative for neck pain and neck stiffness.  Neurological: Negative for dizziness, tremors, seizures, syncope, facial asymmetry, speech difficulty, weakness, light-headedness, numbness and headaches.  Psychiatric/Behavioral: Negative for behavioral problems, confusion and agitation. The patient is not nervous/anxious.        Objective:   Physical Exam   General Mental Status- Alert. General Appearance- Not in acute distress.   Skin General: Color- Normal Color. Moisture- Normal Moisture.    Chest and Lung Exam Auscultation: Breath Sounds:-Normal.  Cardiovascular Auscultation:Rythm- Regular. Murmurs & Other Heart Sounds:Auscultation of the heart reveals- No Murmurs.  Abdomen Inspection:-Inspeection Normal. Palpation/Percussion:Note:No mass. Palpation and Percussion of the abdomen reveal- Non Tender, Non Distended + BS, no rebound or guarding.    Neurologic Cranial Nerve exam:- CN III-XII intact(No  nystagmus), symmetric smile. Romberg Exam:- Negative.  Strength:- 5/5 equal and symmetric strength both upper and lower extremities.        Assessment & Plan:

## 2014-03-20 LAB — URINE CYTOLOGY ANCILLARY ONLY
Bacterial vaginitis: NEGATIVE
Candida vaginitis: NEGATIVE

## 2014-03-21 ENCOUNTER — Ambulatory Visit (INDEPENDENT_AMBULATORY_CARE_PROVIDER_SITE_OTHER): Payer: Medicare Other | Admitting: Family Medicine

## 2014-03-21 VITALS — BP 172/82 | HR 72 | Temp 97.9°F | Resp 18 | Ht 62.0 in | Wt 206.0 lb

## 2014-03-21 DIAGNOSIS — I10 Essential (primary) hypertension: Secondary | ICD-10-CM

## 2014-03-21 DIAGNOSIS — B373 Candidiasis of vulva and vagina: Secondary | ICD-10-CM

## 2014-03-21 DIAGNOSIS — N390 Urinary tract infection, site not specified: Secondary | ICD-10-CM

## 2014-03-21 DIAGNOSIS — B3731 Acute candidiasis of vulva and vagina: Secondary | ICD-10-CM

## 2014-03-21 LAB — URINE CULTURE

## 2014-03-21 MED ORDER — HYDROCORTISONE 2.5 % EX OINT: TOPICAL_OINTMENT | Freq: Two times a day (BID) | CUTANEOUS | Status: DC

## 2014-03-21 MED ORDER — NYSTATIN 100000 UNIT/GM EX OINT: 1.0000 "application " | TOPICAL_OINTMENT | Freq: Three times a day (TID) | CUTANEOUS | Status: DC

## 2014-03-21 NOTE — Progress Notes (Signed)
Subjective:    Patient ID: Felicia Martin, female    DOB: 13-Jul-1932, 81 y.o.   MRN: [id number]  03/21/2014  Vaginal Itching   HPI This 81 y.o. female presents for evaluation of vaginal itching.  Evaluated at [facility] six days ago. Diagnosed with E. Coli UTI; treated with Cipro 500mg  bid x 3 days; completed therapy. Also treated with Diflucan on 03/16/14 within past week for seven days.  Onset two months ago intermittently.  Onset in October 2015; PCP out of town; sent to [facility] to another provider; prescribed medication was $278 after insurance.  Saw Dr. Oletta Lamas on Monday; prescribed Diflucan for three days.  Symptoms are now worse.  Took a urine specimen but has not been contacted with results.  Finished Diflucan x 3 days on Friday/two days ago.  No fever/chills/sweats.  No n/v.  No abdominal pain.  No flank pain.  No frequency; does not feel like emptying bladder completely.  No urgency.  No worsening nocturia.  No hematuria.  +itching and burning; the more pt scratches, the more it itches and burns.  Has not slept for one week due to itching and burning.    Review of Systems  Constitutional: Negative for fever, chills, diaphoresis and fatigue.  Gastrointestinal: Negative for nausea, vomiting and abdominal pain.  Genitourinary: Positive for dysuria and vaginal pain. Negative for urgency, frequency, hematuria, flank pain, vaginal bleeding, vaginal discharge, genital sores and pelvic pain.  Skin: Positive for rash.    Past Medical History  Diagnosis Date  . Pulmonary fibrosis   . Sarcoidosis   . Asthma   . Hypercholesterolemia    Past Surgical History  Procedure Laterality Date  . Lung surgery  1960    Right lung   No Known Allergies Current Outpatient Prescriptions  Medication Sig Dispense Refill  . albuterol (PROVENTIL HFA;VENTOLIN HFA) 108 (90 BASE) MCG/ACT inhaler Inhale 2 puffs into the lungs every 6 (six) hours as needed. 1 Inhaler 1  . amLODipine  (NORVASC) 2.5 MG tablet Take 1 tablet (2.5 mg total) by mouth daily. 30 tablet 3  . aspirin 81 MG tablet Take 81 mg by mouth daily.    . beclomethasone (QVAR) 40 MCG/ACT inhaler Inhale 2 puffs into the lungs 2 (two) times daily. 1 Inhaler 12  . fluconazole (DIFLUCAN) 100 MG tablet Take 1 tablet (100 mg total) by mouth daily. 7 tablet 0  . Calcium Carbonate-Vitamin D (CALCIUM-VITAMIN D) 500-200 MG-UNIT per tablet Take 1 tablet by mouth 2 (two) times daily with a meal.    . hydrocortisone 2.5 % ointment Apply topically 2 (two) times daily. 30 g 2  . nystatin ointment (MYCOSTATIN) Apply 1 application topically 3 (three) times daily. 30 g 2   No current facility-administered medications for this visit.       Objective:    BP 172/82 mmHg  Pulse 72  Temp(Src) 97.9 F (36.6 C)  Resp 18  Ht 5\' 2"  (1.575 m)  Wt 206 lb (93.441 kg)  BMI 37.67 kg/m2  SpO2 95%  LMP 03/17/2014 Physical Exam  Constitutional: She is oriented to person, place, and time. She appears well-developed and well-nourished. No distress.  HENT:  Head: Normocephalic and atraumatic.  Eyes: Conjunctivae are normal. Pupils are equal, round, and reactive to light.  Neck: Normal range of motion. Neck supple.  Cardiovascular: Normal rate, regular rhythm and normal heart sounds.  Exam reveals no gallop and no friction rub.   No murmur heard. Pulmonary/Chest: Effort normal and breath sounds  normal. She has no wheezes. She has no rales.  Abdominal: Soft. Bowel sounds are normal. She exhibits no distension and no mass. There is no tenderness. There is no rebound and no guarding.  Genitourinary: Vagina normal.    There is rash on the right labia. There is no tenderness or lesion on the right labia. There is rash on the left labia. There is no tenderness or lesion on the left labia. No erythema, tenderness or bleeding in the vagina. No vaginal discharge found.  Diffuse rash external labia extending into inguinal folds and to perianal  region; diffuse erythema with scaling.    Neurological: She is alert and oriented to person, place, and time.  Skin: Rash noted. She is not diaphoretic. There is erythema.  Psychiatric: She has a normal mood and affect. Her behavior is normal.  Nursing note and vitals reviewed.  Results for orders placed or performed in visit on 03/17/14  Urine Culture  Result Value Ref Range   Colony Count 50,000 COLONIES/ML    Preliminary Report ESCHERICHIA COLI   POCT Urinalysis Dipstick  Result Value Ref Range   Color, UA Yellow    Clarity, UA Clear    Glucose, UA Neg    Bilirubin, UA Neg    Ketones, UA Neg    Spec Grav, UA 1.025    Blood, UA Neg    pH, UA 6.0    Protein, UA Neg    Urobilinogen, UA 4.0    Nitrite, UA Neg    Leukocytes, UA Negative   Urine cytology ancillary only  Result Value Ref Range   Bacterial vaginitis Negative for Bacterial Vaginitis Microorganisms    Candida vaginitis Negative for Candida Vaginitis Microorganisms        Assessment & Plan:   1. Vulvovaginal candidiasis   2. UTI (lower urinary tract infection)   3. Essential hypertension, benign     -New. Restart Diflucan to complete seven day course of medication. -Rx for Nystatin ointment to external genitalia tid for seven days.  Rx for hydrocortisone 2.5% ointment bid to help with itching.  -Urine culture + 50,000 E.Coli; pt declined treatment today; will await call from [facility] cardiology; UTI not the cause of itching and external symptoms. -Blood pressure elevated/uncontrolled; recommend follow-up with PCP for recheck in upcoming month.  Asymptomatic.    Meds ordered this encounter  Medications  . nystatin ointment (MYCOSTATIN)    Sig: Apply 1 application topically 3 (three) times daily.    Dispense:  30 g    Refill:  2  . hydrocortisone 2.5 % ointment    Sig: Apply topically 2 (two) times daily.    Dispense:  30 g    Refill:  2    No Follow-up on file.    Shamekia Tippets Elayne Guerin, M.D. [facility] [address] [place], [place]  [id number] [phone] phone [phone] fax

## 2014-03-21 NOTE — Patient Instructions (Signed)

## 2014-03-22 ENCOUNTER — Other Ambulatory Visit: Payer: Self-pay | Admitting: Medical

## 2014-03-22 MED ORDER — CIPROFLOXACIN HCL 500 MG PO TABS: 500.0000 mg | ORAL_TABLET | Freq: Two times a day (BID) | ORAL | Status: DC

## 2014-04-06 ENCOUNTER — Encounter: Payer: Self-pay | Admitting: Family Medicine

## 2014-04-06 ENCOUNTER — Ambulatory Visit (INDEPENDENT_AMBULATORY_CARE_PROVIDER_SITE_OTHER): Payer: Medicare Other | Admitting: Family Medicine

## 2014-04-06 VITALS — BP 160/78 | HR 86 | Temp 98.3°F | Wt 205.2 lb

## 2014-04-06 DIAGNOSIS — I10 Essential (primary) hypertension: Secondary | ICD-10-CM | POA: Diagnosis not present

## 2014-04-06 DIAGNOSIS — N390 Urinary tract infection, site not specified: Secondary | ICD-10-CM

## 2014-04-06 DIAGNOSIS — R739 Hyperglycemia, unspecified: Secondary | ICD-10-CM | POA: Diagnosis not present

## 2014-04-06 DIAGNOSIS — B3731 Acute candidiasis of vulva and vagina: Secondary | ICD-10-CM

## 2014-04-06 DIAGNOSIS — B373 Candidiasis of vulva and vagina: Secondary | ICD-10-CM | POA: Diagnosis not present

## 2014-04-06 DIAGNOSIS — E785 Hyperlipidemia, unspecified: Secondary | ICD-10-CM | POA: Diagnosis not present

## 2014-04-06 LAB — LIPID PANEL
CHOLESTEROL: 285 mg/dL — AB (ref 0–200)
HDL: 59.3 mg/dL (ref >39.00)
LDL CALC: 201 mg/dL — AB (ref 0–99)
NonHDL: 225.7
Total CHOL/HDL Ratio: 5
Triglycerides: 123 mg/dL (ref 0.0–149.0)
VLDL: 24.6 mg/dL (ref 0.0–40.0)

## 2014-04-06 LAB — POCT URINALYSIS DIPSTICK
Bilirubin, UA: NEGATIVE
Glucose, UA: NEGATIVE
Ketones, UA: NEGATIVE
NITRITE UA: NEGATIVE
PROTEIN UA: NEGATIVE
RBC UA: NEGATIVE
Spec Grav, UA: >=1.03
UROBILINOGEN UA: 0.2
pH, UA: 6

## 2014-04-06 LAB — BASIC METABOLIC PANEL
BUN: 23 mg/dL (ref 6–23)
CALCIUM: 9.9 mg/dL (ref 8.4–10.5)
CHLORIDE: 102 meq/L (ref 96–112)
CO2: 29 mEq/L (ref 19–32)
Creatinine, Ser: 0.81 mg/dL (ref 0.40–1.20)
GFR: 87.67 mL/min (ref >60.00)
GLUCOSE: 91 mg/dL (ref 70–99)
Potassium: 4 mEq/L (ref 3.5–5.1)
Sodium: 135 mEq/L (ref 135–145)

## 2014-04-06 LAB — HEPATIC FUNCTION PANEL
ALT: 9 U/L (ref 0–35)
AST: 17 U/L (ref 0–37)
Albumin: 4.1 g/dL (ref 3.5–5.2)
Alkaline Phosphatase: 75 U/L (ref 39–117)
BILIRUBIN DIRECT: 0.1 mg/dL (ref 0.0–0.3)
Total Bilirubin: 0.4 mg/dL (ref 0.2–1.2)
Total Protein: 8.5 g/dL — ABNORMAL HIGH (ref 6.0–8.3)

## 2014-04-06 LAB — HEMOGLOBIN A1C: Hgb A1c MFr Bld: 6.5 % (ref 4.6–6.5)

## 2014-04-06 MED ORDER — AMLODIPINE BESYLATE 5 MG PO TABS: 5.0000 mg | ORAL_TABLET | Freq: Every day | ORAL | Status: DC

## 2014-04-06 MED ORDER — NYSTATIN 100000 UNIT/GM EX POWD: Freq: Four times a day (QID) | CUTANEOUS | Status: DC

## 2014-04-06 NOTE — Patient Instructions (Signed)

## 2014-04-06 NOTE — Progress Notes (Signed)
Pre visit review using our clinic review tool, if applicable. No additional management support is needed unless otherwise documented below in the visit note. 

## 2014-04-08 LAB — URINE CULTURE
Colony Count: NO GROWTH
Organism ID, Bacteria: NO GROWTH

## 2014-04-08 NOTE — Progress Notes (Signed)
Subjective:    Patient ID: Felicia Martin, female    DOB: 10-26-1932, 81 y.o.   MRN: [id number]  HPI Pt here to f/u bp and recurrent ext genital yeast infections.  She has used mult tab and creams with no relief.    Past Medical History  Diagnosis Date  . Pulmonary fibrosis   . Sarcoidosis   . Asthma   . Hypercholesterolemia    History   Social History  . Marital Status: Single    Spouse Name: N/A    Number of Children: N/A  . Years of Education: N/A   Occupational History  . [employer]    Social History Main Topics  . Smoking status: Former Smoker -- 0.50 packs/day for 40 years    Types: Cigarettes    Quit date: 05/25/1999  . Smokeless tobacco: Never Used  . Alcohol Use: No  . Drug Use: No  . Sexual Activity: No   Other Topics Concern  . Not on file   Social History Narrative   Current Outpatient Prescriptions  Medication Sig Dispense Refill  . albuterol (PROVENTIL HFA;VENTOLIN HFA) 108 (90 BASE) MCG/ACT inhaler Inhale 2 puffs into the lungs every 6 (six) hours as needed. 1 Inhaler 1  . aspirin 81 MG tablet Take 81 mg by mouth daily.    . beclomethasone (QVAR) 40 MCG/ACT inhaler Inhale 2 puffs into the lungs 2 (two) times daily. 1 Inhaler 12  . Calcium Carbonate-Vitamin D (CALCIUM-VITAMIN D) 500-200 MG-UNIT per tablet Take 1 tablet by mouth 2 (two) times daily with a meal.    . amLODipine (NORVASC) 5 MG tablet Take 1 tablet (5 mg total) by mouth daily. 30 tablet 2  . nystatin (MYCOSTATIN) powder Apply topically 4 (four) times daily. 15 g 0   No current facility-administered medications for this visit.    Review of Systems  Constitutional: Negative for fever, chills, diaphoresis, activity change, appetite change, fatigue and unexpected weight change.  Genitourinary: Negative for dysuria, frequency, flank pain, vaginal bleeding, vaginal discharge, enuresis, difficulty urinating, vaginal pain and dyspareunia.  Musculoskeletal: Positive for arthralgias.  Skin:  Positive for rash.       + errythema in groin and vulva  Psychiatric/Behavioral: Negative.        Objective:   Physical Exam  BP 160/78 mmHg  Pulse 86  Temp(Src) 98.3 F (36.8 C) (Oral)  Wt 205 lb 3.2 oz (93.078 kg)  SpO2 96%  LMP 03/17/2014 General appearance: alert, cooperative, appears stated age and no distress Nose: Nares normal. Septum midline. Mucosa normal. No drainage or sinus tenderness. Throat: lips, mucosa, and tongue normal; teeth and gums normal Neck: no adenopathy, supple, symmetrical, trachea midline and thyroid not enlarged, symmetric, no tenderness/mass/nodules Lungs: clear to auscultation bilaterally Heart: S1, S2 normal Extremities: extremities normal, atraumatic, no cyanosis or edema Skin: +errythema in groin and vulva       Assessment & Plan:  1. Urinary tract infection without hematuria, site unspecified  - POCT Urinalysis Dipstick - Urine Culture  2. Essential hypertension Elevated,  Inc norvasc to 5 mg - amLODipine (NORVASC) 5 MG tablet; Take 1 tablet (5 mg total) by mouth daily.  Dispense: 30 tablet; Refill: 2 - Basic metabolic panel - Hepatic function panel - Lipid panel  3. Vulvar yeast infection  - nystatin (MYCOSTATIN) powder; Apply topically 4 (four) times daily.  Dispense: 15 g; Refill: 0  4. Hyperlipidemia Check labs - Basic metabolic panel - Hepatic function panel - Lipid panel - Hepatic function panel; Future -  Lipid panel; Future  5. Hyperglycemia Check labs - Hemoglobin A1c

## 2014-04-19 ENCOUNTER — Other Ambulatory Visit: Payer: Self-pay

## 2014-04-19 MED ORDER — EZETIMIBE 10 MG PO TABS: 10.0000 mg | ORAL_TABLET | Freq: Every day | ORAL | Status: DC

## 2014-05-18 ENCOUNTER — Telehealth: Payer: Self-pay | Admitting: Family Medicine

## 2014-05-18 DIAGNOSIS — E785 Hyperlipidemia, unspecified: Secondary | ICD-10-CM

## 2014-05-18 NOTE — Telephone Encounter (Signed)
Caller name: Nazia, Rhines Relation to pt: self  Call back number: [phone] Pharmacy: [id number] [place], [place] [phone] (Phone) [phone] (Fax)        Reason for call:  Pt states cholosterol medication is to expensive. Requesting another medication. Please advise

## 2014-05-18 NOTE — Telephone Encounter (Signed)
Zetia is too expensive and the patient is allergic to statins. Please advise     KP

## 2014-05-18 NOTE — Telephone Encounter (Signed)
Lipid clinic to see if she qualifies for new med -- for free

## 2014-05-18 NOTE — Telephone Encounter (Signed)
Discussed with patient and she agreed to go to the Lipid clinic. Ref placed.      KP

## 2014-05-24 ENCOUNTER — Telehealth: Payer: Self-pay | Admitting: Family Medicine

## 2014-05-24 NOTE — Telephone Encounter (Signed)
Please advise      KP 

## 2014-05-24 NOTE — Telephone Encounter (Signed)
Yes we can 

## 2014-05-24 NOTE — Telephone Encounter (Signed)
Caller name: Jana Relation to pt: self Call back number: [phone] Pharmacy:  Reason for call:   Patient states that she lost her handicap placard and would like to know if Dr. Etter Sjogren would fill out another one for her.

## 2014-05-24 NOTE — Telephone Encounter (Signed)
Patient aware Placard form is ready for pick up.      KP

## 2014-05-26 ENCOUNTER — Ambulatory Visit (HOSPITAL_COMMUNITY): Payer: Medicare Other

## 2014-06-10 ENCOUNTER — Telehealth: Payer: Self-pay | Admitting: Family Medicine

## 2014-06-10 NOTE — Telephone Encounter (Signed)
General 05/27/2014 11:23 AM Felicia Martin, Felicia Martin        Note   In [facility] work que/ pt will have to establish with Cardiology first, then can be seen by lipid clinic/awaiting appt                       Type Date User   General 05/27/2014 8:23 AM [name]        Summary   Auto: Referral message        Note   ----- Message -----    From: Synthia Innocent    Sent: 05/27/2014  8:08 AM     To: Aris Georgia, RPH      Good Morning!   Can you please advise me what to do with this patient, please. Cardiology states that if they aren't established with a cardiologist there, then they can't be seen. Who do you recommend I send them to? Thanks in advance!      Spoke with patient and she verbalized understanding that she will need to see the Cardiologist before lipid clinic.     KP

## 2014-06-10 NOTE — Telephone Encounter (Signed)
Caller name: Kailia, Starry Relation to pt: self  Call back number: [phone]   Reason for call:  Pt inquring about upcoming cardologist visit on 06/28/14 with Dr. Meda Coffee. Pt would like an understanding on why she needs this appointment. Please advise

## 2014-06-28 ENCOUNTER — Encounter: Payer: Self-pay | Admitting: Cardiology

## 2014-06-28 ENCOUNTER — Ambulatory Visit (INDEPENDENT_AMBULATORY_CARE_PROVIDER_SITE_OTHER): Payer: Medicare Other | Admitting: Cardiology

## 2014-06-28 VITALS — BP 142/82 | HR 81 | Ht 62.0 in | Wt 209.0 lb

## 2014-06-28 DIAGNOSIS — E785 Hyperlipidemia, unspecified: Secondary | ICD-10-CM | POA: Diagnosis not present

## 2014-06-28 DIAGNOSIS — R011 Cardiac murmur, unspecified: Secondary | ICD-10-CM | POA: Diagnosis not present

## 2014-06-28 DIAGNOSIS — I159 Secondary hypertension, unspecified: Secondary | ICD-10-CM | POA: Diagnosis not present

## 2014-06-28 DIAGNOSIS — R9431 Abnormal electrocardiogram [ECG] [EKG]: Secondary | ICD-10-CM | POA: Diagnosis not present

## 2014-06-28 MED ORDER — HYDROCHLOROTHIAZIDE 25 MG PO TABS
25.0000 mg | ORAL_TABLET | Freq: Every day | ORAL | Status: DC
Start: 2014-06-28 — End: 2014-10-01

## 2014-06-28 MED ORDER — PRAVASTATIN SODIUM 20 MG PO TABS: 20.0000 mg | ORAL_TABLET | Freq: Every day | ORAL | Status: DC

## 2014-06-28 NOTE — Patient Instructions (Signed)
Medication Instructions:  Your physician has recommended you make the following change in your medication:  1) START Pravastatin 20 mg tablet daily 2) START Hydrochlorothiazide 25 mg tablet daily   Labwork: Your physician recommends that you return for lab work in: 3 months [place] F/U APPT   Testing/Procedures: NONE  Follow-Up: Your physician recommends that you schedule a follow-up appointment in: [place]. Meda Coffee.   Any Other Special Instructions Will Be Listed Below (If Applicable).

## 2014-06-28 NOTE — Progress Notes (Signed)
Patient ID: Felicia Martin, female   DOB: December 25, 1932, 81 y.o.   MRN: [id number]      Cardiology Office Note   Date:  06/28/2014   ID:  Felicia Martin, DOB [birth year], MRN [id number]  PCP:  Garnet Koyanagi, DO  Cardiologist: Dorothy Spark, MD   No chief complaint on file.  Chief complain: referral for hyperlipidemia and abnormal ECG   History of Present Illness: Felicia Martin is a 81 y.o. female who was referred by her PCP for abnormal ECG and hyperlipidemia. She has h/o HTN, hyperlipidemia and tried in the past on atorva- and rosuvastatin with significant side effects. The patient exercises on a regular basis and works as a Advertising account executive feel experience any chest pain or SOB, also denies palpitations or syncope. She has mild LE edema, no orthopnea or PND.  She has underwent two stress tests in the past, both negative and at this point in her life she doesn't want to undergo any other. She has family h/o CAD or CHF.   Past Medical History  Diagnosis Date  . Pulmonary fibrosis   . Sarcoidosis   . Asthma   . Hypercholesterolemia     Past Surgical History  Procedure Laterality Date  . Lung surgery  1960    Right lung     Current Outpatient Prescriptions  Medication Sig Dispense Refill  . albuterol (PROVENTIL HFA;VENTOLIN HFA) 108 (90 BASE) MCG/ACT inhaler Inhale 2 puffs into the lungs every 6 (six) hours as needed. 1 Inhaler 1  . amLODipine (NORVASC) 5 MG tablet Take 1 tablet (5 mg total) by mouth daily. 30 tablet 2  . aspirin 81 MG tablet Take 81 mg by mouth daily.    . beclomethasone (QVAR) 40 MCG/ACT inhaler Inhale 2 puffs into the lungs 2 (two) times daily. 1 Inhaler 12  . Calcium Carbonate-Vitamin D (CALCIUM-VITAMIN D) 500-200 MG-UNIT per tablet Take 1 tablet by mouth 2 (two) times daily with a meal.    . nystatin (MYCOSTATIN) powder Apply topically 4 (four) times daily. 15 g 0   No current facility-administered medications for this visit.     Allergies:   Statins    Social History:  The patient  reports that she quit smoking about 15 years ago. Her smoking use included Cigarettes. She has a 20 pack-year smoking history. She has never used smokeless tobacco. She reports that she does not drink alcohol or use illicit drugs.   Family History:  The patient's family history includes Cancer in her brother and father; Diabetes in her brother and sister; Hyperlipidemia in an other family member; Hypertension in her sister; Liver cancer in her father and mother.    ROS:  Please see the history of present illness.  All other systems are reviewed and negative.    PHYSICAL EXAM: VS:  BP 142/82 mmHg  Pulse 81  Ht 5\' 2"  (1.575 m)  Wt 209 lb (94.802 kg)  BMI 38.22 kg/m2  SpO2 97%  LMP 03/17/2014 , BMI Body mass index is 38.22 kg/(m^2). GEN: Well nourished, well developed, in no acute distress HEENT: normal Neck: no JVD, carotid bruits, or masses Cardiac: RRR; 2/6 short diastolic murmur, rubs, or gallops, B/L mild LE edema. Respiratory:  clear to auscultation bilaterally, normal work of breathing GI: soft, nontender, nondistended, + BS MS: no deformity or atrophy Skin: warm and dry, no rash Neuro:  Strength and sensation are intact Psych: euthymic mood, full affect   EKG:  EKG: SR, negative T  waves in the anterolateral leads (ECG from 12/07/2013)   Recent Labs: 12/16/2013: Hemoglobin 12.2; Platelets 279.0 04/06/2014: ALT 9; BUN 23; Creatinine 0.81; Potassium 4.0; Sodium 135    Lipid Panel    Component Value Date/Time   CHOL 285* 04/06/2014 1420   TRIG 123.0 04/06/2014 1420   HDL 59.30 04/06/2014 1420   CHOLHDL 5 04/06/2014 1420   VLDL 24.6 04/06/2014 1420   LDLCALC 201* 04/06/2014 1420   LDLDIRECT 230.7 03/18/2013 0805      Wt Readings from Last 3 Encounters:  06/28/14 209 lb (94.802 kg)  04/06/14 205 lb 3.2 oz (93.078 kg)  03/21/14 206 lb (93.441 kg)      ASSESSMENT AND PLAN:  1. Abnormal ECG -  anterolateral negative T waves, however unchanged from 2009, the patient is asymptomatic and not interested in having a stress test. I would start aggressive lipid management.   2. Hyperlipidemia - LDL 200, start pravastatin 20 mg po daily and recheck CMP in 3 weeks.   3. Murmur - order echocardiogram   4. HTN - start hydrochlorothiazide 25 mg po daily, this will also help with mild LE edema.    Labs/ tests ordered today include: CMP, lipids in 3 -4 weeks  Disposition:   FU with Natsha Guidry H in 3 months.  Signed, Dorothy Spark, MD  06/28/2014 2:18 PM    [facility] [address], [place], [place]  [address] Phone: [phone]; Fax: [phone]

## 2014-07-12 ENCOUNTER — Other Ambulatory Visit: Payer: Self-pay | Admitting: Family Medicine

## 2014-07-12 DIAGNOSIS — Z1231 Encounter for screening mammogram for malignant neoplasm of breast: Secondary | ICD-10-CM

## 2014-07-14 ENCOUNTER — Other Ambulatory Visit: Payer: Medicare Other

## 2014-07-14 ENCOUNTER — Other Ambulatory Visit: Payer: Self-pay | Admitting: Family Medicine

## 2014-07-15 ENCOUNTER — Ambulatory Visit (HOSPITAL_COMMUNITY)
Admission: RE | Admit: 2014-07-15 | Discharge: 2014-07-15 | Disposition: A | Discharge status: Home / Self Care | Payer: Medicare Other | Source: Ambulatory Visit | Attending: Family Medicine | Admitting: Family Medicine

## 2014-07-15 DIAGNOSIS — Z1231 Encounter for screening mammogram for malignant neoplasm of breast: Secondary | ICD-10-CM | POA: Insufficient documentation

## 2014-07-16 ENCOUNTER — Other Ambulatory Visit: Payer: Self-pay | Admitting: Family Medicine

## 2014-07-21 ENCOUNTER — Other Ambulatory Visit (INDEPENDENT_AMBULATORY_CARE_PROVIDER_SITE_OTHER): Payer: Medicare Other

## 2014-07-21 ENCOUNTER — Ambulatory Visit (INDEPENDENT_AMBULATORY_CARE_PROVIDER_SITE_OTHER): Payer: Medicare Other | Admitting: Family Medicine

## 2014-07-21 VITALS — BP 134/76 | HR 81

## 2014-07-21 DIAGNOSIS — E785 Hyperlipidemia, unspecified: Secondary | ICD-10-CM | POA: Diagnosis not present

## 2014-07-21 DIAGNOSIS — I10 Essential (primary) hypertension: Secondary | ICD-10-CM | POA: Diagnosis not present

## 2014-07-21 LAB — LIPID PANEL
Cholesterol: 211 mg/dL — ABNORMAL HIGH (ref 0–200)
HDL: 51.5 mg/dL (ref >39.00)
LDL CALC: 138 mg/dL — AB (ref 0–99)
NONHDL: 159.5
Total CHOL/HDL Ratio: 4
Triglycerides: 109 mg/dL (ref 0.0–149.0)
VLDL: 21.8 mg/dL (ref 0.0–40.0)

## 2014-07-21 LAB — HEPATIC FUNCTION PANEL
ALBUMIN: 3.8 g/dL (ref 3.5–5.2)
ALT: 12 U/L (ref 0–35)
AST: 20 U/L (ref 0–37)
Alkaline Phosphatase: 73 U/L (ref 39–117)
Bilirubin, Direct: 0.1 mg/dL (ref 0.0–0.3)
TOTAL PROTEIN: 8.2 g/dL (ref 6.0–8.3)
Total Bilirubin: 0.4 mg/dL (ref 0.2–1.2)

## 2014-07-21 NOTE — Progress Notes (Signed)
   Subjective:    Patient ID: Felicia Martin, female    DOB: [birth year], 81 y.o.   MRN: [id number]  HPI Pt came in for BP check.  Normal today.  On HCTZ and Amlodipine.   Review of Systems     Objective:   Physical Exam  Constitutional: She is oriented to person, place, and time. She appears well-developed and well-nourished.  Neurological: She is alert and oriented to person, place, and time.  Vitals reviewed.         Assessment & Plan:  Continue current meds F/u w/ PCP

## 2014-07-21 NOTE — Progress Notes (Signed)
Pre visit review using our clinic review tool, if applicable. No additional management support is needed unless otherwise documented below in the visit note. 

## 2014-07-26 ENCOUNTER — Other Ambulatory Visit: Payer: Self-pay

## 2014-07-26 DIAGNOSIS — R9431 Abnormal electrocardiogram [ECG] [EKG]: Secondary | ICD-10-CM

## 2014-07-26 DIAGNOSIS — I159 Secondary hypertension, unspecified: Secondary | ICD-10-CM

## 2014-07-26 DIAGNOSIS — E785 Hyperlipidemia, unspecified: Secondary | ICD-10-CM

## 2014-07-26 MED ORDER — PRAVASTATIN SODIUM 40 MG PO TABS: 40.0000 mg | ORAL_TABLET | Freq: Every day | ORAL | Status: DC

## 2014-07-29 ENCOUNTER — Telehealth: Payer: Self-pay | Admitting: Family Medicine

## 2014-07-29 NOTE — Telephone Encounter (Signed)
The labs were mailed on 07/23/14.     KP

## 2014-07-29 NOTE — Telephone Encounter (Signed)
Caller name: Emmalea Relation to pt: self Call back number: [phone] Pharmacy:  Reason for call:   Requesting last lab results to be mailed to her.

## 2014-10-01 ENCOUNTER — Encounter: Payer: Self-pay | Admitting: Gastroenterology

## 2014-10-01 ENCOUNTER — Ambulatory Visit (INDEPENDENT_AMBULATORY_CARE_PROVIDER_SITE_OTHER): Payer: Medicare Other | Admitting: Family Medicine

## 2014-10-01 ENCOUNTER — Encounter: Payer: Self-pay | Admitting: Family Medicine

## 2014-10-01 VITALS — BP 138/76 | HR 70 | Temp 98.0°F | Ht 62.0 in | Wt 210.0 lb

## 2014-10-01 DIAGNOSIS — E785 Hyperlipidemia, unspecified: Secondary | ICD-10-CM

## 2014-10-01 DIAGNOSIS — R1314 Dysphagia, pharyngoesophageal phase: Secondary | ICD-10-CM | POA: Diagnosis not present

## 2014-10-01 DIAGNOSIS — I159 Secondary hypertension, unspecified: Secondary | ICD-10-CM | POA: Diagnosis not present

## 2014-10-01 DIAGNOSIS — R9431 Abnormal electrocardiogram [ECG] [EKG]: Secondary | ICD-10-CM

## 2014-10-01 MED ORDER — HYDROCHLOROTHIAZIDE 25 MG PO TABS: 25.0000 mg | ORAL_TABLET | Freq: Every day | ORAL | Status: DC

## 2014-10-01 MED ORDER — PANTOPRAZOLE SODIUM 40 MG PO TBEC: 40.0000 mg | DELAYED_RELEASE_TABLET | Freq: Every day | ORAL | Status: DC

## 2014-10-01 MED ORDER — AMLODIPINE BESYLATE 5 MG PO TABS: 5.0000 mg | ORAL_TABLET | Freq: Every day | ORAL | Status: DC

## 2014-10-01 NOTE — Progress Notes (Signed)
Patient ID: Felicia Martin, female    DOB: 1932/05/02  Age: 81 y.o. MRN: [id number]    Subjective:  Subjective HPI Felicia Martin presents for c/o feeling like food is getting stuck and she struggles to get food down x few weeks.  No NV.  No abd pain.   Review of Systems  Constitutional: Negative for diaphoresis, appetite change, fatigue and unexpected weight change.  HENT: Positive for trouble swallowing. Negative for sore throat and voice change.   Eyes: Negative for pain, redness and visual disturbance.  Respiratory: Negative for cough, chest tightness, shortness of breath and wheezing.   Cardiovascular: Negative for chest pain, palpitations and leg swelling.  Gastrointestinal: Negative for nausea, vomiting and constipation.  Endocrine: Negative for cold intolerance, heat intolerance, polydipsia, polyphagia and polyuria.  Genitourinary: Negative for dysuria, frequency and difficulty urinating.  Neurological: Negative for dizziness, light-headedness, numbness and headaches.    History Past Medical History  Diagnosis Date  . Pulmonary fibrosis   . Sarcoidosis   . Asthma   . Hypercholesterolemia     She has past surgical history that includes Lung surgery (1960).   Her family history includes Cancer in her brother and father; Diabetes in her brother and sister; Hyperlipidemia in an other family member; Hypertension in her sister; Liver cancer in her father and mother.She reports that she quit smoking about 15 years ago. Her smoking use included Cigarettes. She has a 20 pack-year smoking history. She has never used smokeless tobacco. She reports that she does not drink alcohol or use illicit drugs.  Current Outpatient Prescriptions on File Prior to Visit  Medication Sig Dispense Refill  . albuterol (PROVENTIL HFA;VENTOLIN HFA) 108 (90 BASE) MCG/ACT inhaler Inhale 2 puffs into the lungs every 6 (six) hours as needed. 1 Inhaler 1  . aspirin 81 MG tablet Take 81 mg by mouth  daily.    . beclomethasone (QVAR) 40 MCG/ACT inhaler Inhale 2 puffs into the lungs 2 (two) times daily. 1 Inhaler 12  . Calcium Carbonate-Vitamin D (CALCIUM-VITAMIN D) 500-200 MG-UNIT per tablet Take 1 tablet by mouth 2 (two) times daily with a meal.    . meloxicam (MOBIC) 15 MG tablet TAKE 1 TABLET BY MOUTH EVERY DAY AS NEEDED FOR PAIN 30 tablet 2  . nystatin (MYCOSTATIN) powder Apply topically 4 (four) times daily. 15 g 0  . pravastatin (PRAVACHOL) 40 MG tablet Take 1 tablet (40 mg total) by mouth daily. 90 tablet 1   No current facility-administered medications on file prior to visit.     Objective:  Objective Physical Exam  Constitutional: She is oriented to person, place, and time. She appears well-developed and well-nourished.  HENT:  Head: Normocephalic and atraumatic.  Eyes: Conjunctivae and EOM are normal.  Neck: Normal range of motion. Neck supple. No JVD present. Carotid bruit is not present. No thyromegaly present.  Cardiovascular: Normal rate, regular rhythm and normal heart sounds.   No murmur heard. Pulmonary/Chest: Effort normal and breath sounds normal. No respiratory distress. She has no wheezes. She has no rales. She exhibits no tenderness.  Abdominal: Soft. There is tenderness.    Musculoskeletal: She exhibits no edema.  Neurological: She is alert and oriented to person, place, and time.  Psychiatric: She has a normal mood and affect. Her behavior is normal.   BP 138/76 mmHg  Pulse 70  Temp(Src) 98 F (36.7 C) (Oral)  Ht 5\' 2"  (1.575 m)  Wt 210 lb (95.255 kg)  BMI 38.40 kg/m2  SpO2 98%  LMP 03/17/2014 Wt Readings from Last 3 Encounters:  10/01/14 210 lb (95.255 kg)  06/28/14 209 lb (94.802 kg)  04/06/14 205 lb 3.2 oz (93.078 kg)     Lab Results  Component Value Date   WBC 4.4 12/16/2013   HGB 12.2 12/16/2013   HCT 36.7 12/16/2013   PLT 279.0 12/16/2013   GLUCOSE 91 04/06/2014   CHOL 211* 07/21/2014   TRIG 109.0 07/21/2014   HDL 51.50 07/21/2014    LDLDIRECT 230.7 03/18/2013   LDLCALC 138* 07/21/2014   ALT 12 07/21/2014   AST 20 07/21/2014   NA 135 04/06/2014   K 4.0 04/06/2014   CL 102 04/06/2014   CREATININE 0.81 04/06/2014   BUN 23 04/06/2014   CO2 29 04/06/2014   TSH 2.19 09/29/2009   HGBA1C 6.5 04/06/2014    Mm Digital Screening Bilateral  07/15/2014   CLINICAL DATA:  Screening.  EXAM: DIGITAL SCREENING BILATERAL MAMMOGRAM WITH CAD  COMPARISON:  Previous exam(s).  ACR Breast Density Category a: The breast tissue is almost entirely fatty.  FINDINGS: There are no findings suspicious for malignancy. Images were processed with CAD.  IMPRESSION: No mammographic evidence of malignancy. A result letter of this screening mammogram will be mailed directly to the patient.  RECOMMENDATION: Screening mammogram in one year. (Code:SM-B-01Y)  BI-RADS CATEGORY  1: Negative.   Electronically Signed   By: Conchita Paris M.D.   On: 07/15/2014 15:49     Assessment & Plan:  Plan I have changed Ms. Diaz's amLODipine. I am also having her start on pantoprazole. Additionally, I am having her maintain her aspirin, calcium-vitamin D, albuterol, beclomethasone, nystatin, meloxicam, pravastatin, and hydrochlorothiazide.  Meds ordered this encounter  Medications  . amLODipine (NORVASC) 5 MG tablet    Sig: Take 1 tablet (5 mg total) by mouth daily.    Dispense:  30 tablet    Refill:  3  . hydrochlorothiazide (HYDRODIURIL) 25 MG tablet    Sig: Take 1 tablet (25 mg total) by mouth daily.    Dispense:  90 tablet    Refill:  3  . pantoprazole (PROTONIX) 40 MG tablet    Sig: Take 1 tablet (40 mg total) by mouth daily.    Dispense:  30 tablet    Refill:  3    Problem List Items Addressed This Visit    None    Visit Diagnoses    Dysphagia, pharyngoesophageal phase    -  Primary    Relevant Medications    pantoprazole (PROTONIX) 40 MG tablet    Other Relevant Orders    Ambulatory referral to Gastroenterology    Basic metabolic panel    H.  pylori antibody, IgG    POCT urinalysis dipstick    Abnormal EKG        Relevant Medications    hydrochlorothiazide (HYDRODIURIL) 25 MG tablet    Secondary hypertension, unspecified        Relevant Medications    amLODipine (NORVASC) 5 MG tablet    hydrochlorothiazide (HYDRODIURIL) 25 MG tablet    Other Relevant Orders    Basic metabolic panel    H. pylori antibody, IgG    Hepatic function panel    Lipid panel    POCT urinalysis dipstick    Hyperlipidemia        Relevant Medications    amLODipine (NORVASC) 5 MG tablet    hydrochlorothiazide (HYDRODIURIL) 25 MG tablet    Other Relevant Orders    Hepatic function panel  Lipid panel    POCT urinalysis dipstick       Follow-up: Return in about 6 months (around 04/03/2015), or if symptoms worsen or fail to improve.  Garnet Koyanagi, DO

## 2014-10-01 NOTE — Progress Notes (Signed)
Pre visit review using our clinic review tool, if applicable. No additional management support is needed unless otherwise documented below in the visit note. 

## 2014-10-01 NOTE — Patient Instructions (Signed)

## 2014-10-06 ENCOUNTER — Other Ambulatory Visit (INDEPENDENT_AMBULATORY_CARE_PROVIDER_SITE_OTHER): Payer: Medicare Other

## 2014-10-06 DIAGNOSIS — R1314 Dysphagia, pharyngoesophageal phase: Secondary | ICD-10-CM

## 2014-10-06 DIAGNOSIS — E785 Hyperlipidemia, unspecified: Secondary | ICD-10-CM

## 2014-10-06 DIAGNOSIS — I159 Secondary hypertension, unspecified: Secondary | ICD-10-CM | POA: Diagnosis not present

## 2014-10-06 LAB — HEPATIC FUNCTION PANEL
ALBUMIN: 4.1 g/dL (ref 3.5–5.2)
ALK PHOS: 75 U/L (ref 39–117)
ALT: 11 U/L (ref 0–35)
AST: 17 U/L (ref 0–37)
Bilirubin, Direct: 0.1 mg/dL (ref 0.0–0.3)
Total Bilirubin: 0.5 mg/dL (ref 0.2–1.2)
Total Protein: 8.2 g/dL (ref 6.0–8.3)

## 2014-10-06 LAB — LIPID PANEL
CHOL/HDL RATIO: 4
CHOLESTEROL: 235 mg/dL — AB (ref 0–200)
HDL: 54.7 mg/dL (ref >39.00)
LDL CALC: 157 mg/dL — AB (ref 0–99)
NonHDL: 180.3
TRIGLYCERIDES: 115 mg/dL (ref 0.0–149.0)
VLDL: 23 mg/dL (ref 0.0–40.0)

## 2014-10-06 LAB — BASIC METABOLIC PANEL
BUN: 22 mg/dL (ref 6–23)
CHLORIDE: 102 meq/L (ref 96–112)
CO2: 28 mEq/L (ref 19–32)
CREATININE: 0.84 mg/dL (ref 0.40–1.20)
Calcium: 9.8 mg/dL (ref 8.4–10.5)
GFR: 83.96 mL/min (ref >60.00)
GLUCOSE: 122 mg/dL — AB (ref 70–99)
POTASSIUM: 4 meq/L (ref 3.5–5.1)
Sodium: 137 mEq/L (ref 135–145)

## 2014-10-06 LAB — H. PYLORI ANTIBODY, IGG: H Pylori IgG: NEGATIVE

## 2014-10-13 ENCOUNTER — Other Ambulatory Visit: Payer: Medicare Other

## 2014-10-21 ENCOUNTER — Ambulatory Visit (INDEPENDENT_AMBULATORY_CARE_PROVIDER_SITE_OTHER): Payer: Medicare Other | Admitting: Gastroenterology

## 2014-10-21 ENCOUNTER — Encounter: Payer: Self-pay | Admitting: Gastroenterology

## 2014-10-21 VITALS — BP 128/70 | HR 68 | Ht 60.0 in | Wt 205.0 lb

## 2014-10-21 DIAGNOSIS — J8410 Pulmonary fibrosis, unspecified: Secondary | ICD-10-CM | POA: Diagnosis not present

## 2014-10-21 DIAGNOSIS — R1310 Dysphagia, unspecified: Secondary | ICD-10-CM | POA: Diagnosis not present

## 2014-10-21 DIAGNOSIS — Z1211 Encounter for screening for malignant neoplasm of colon: Secondary | ICD-10-CM | POA: Insufficient documentation

## 2014-10-21 MED ORDER — NA SULFATE-K SULFATE-MG SULF 17.5-3.13-1.6 GM/177ML PO SOLN: 1.0000 | Freq: Once | ORAL | Status: DC

## 2014-10-21 NOTE — Assessment & Plan Note (Signed)
Dysphagia is probably due to a peptic stricture.  I suspect there is an inflammatory component since symptoms did improve with Protonix.  A malignant stricture should be ruled out.  Recommendations #1 EGD with dilation as indicated #2 continue Protonix  CC Dr. Etter Sjogren

## 2014-10-21 NOTE — Assessment & Plan Note (Signed)
Patient has stable pulmonary function

## 2014-10-21 NOTE — Progress Notes (Signed)
_                                                                                                                History of Present Illness:  Felicia Martin is a pleasant 81 year old Afro-American female referred at the request of Dr. Etter Sjogren for evaluation of dysphagia.  She has developed dysphagia to solids.  When this occurs she may have chest discomfort as well.  She has occasional pyrosis.  Since starting Protonix symptoms have improved.  The patient has never had a colonoscopy.  She denies lower GI complaints including change of bowel habits, abdominal pain or rectal bleeding.  She has stable chronic lung disease.   Past Medical History  Diagnosis Date  . Pulmonary fibrosis   . Sarcoidosis   . Asthma   . Hypercholesterolemia    Past Surgical History  Procedure Laterality Date  . Lung surgery  1960    Right lung   family history includes Cancer in her brother and father; Diabetes in her brother and sister; Hyperlipidemia in an other family member; Hypertension in her sister; Liver cancer in her father and mother. Current Outpatient Prescriptions  Medication Sig Dispense Refill  . albuterol (PROVENTIL HFA;VENTOLIN HFA) 108 (90 BASE) MCG/ACT inhaler Inhale 2 puffs into the lungs every 6 (six) hours as needed. 1 Inhaler 1  . amLODipine (NORVASC) 5 MG tablet Take 1 tablet (5 mg total) by mouth daily. 30 tablet 3  . aspirin 81 MG tablet Take 81 mg by mouth daily.    . beclomethasone (QVAR) 40 MCG/ACT inhaler Inhale 2 puffs into the lungs 2 (two) times daily. 1 Inhaler 12  . Calcium Carbonate-Vitamin D (CALCIUM-VITAMIN D) 500-200 MG-UNIT per tablet Take 1 tablet by mouth 2 (two) times daily with a meal.    . hydrochlorothiazide (HYDRODIURIL) 25 MG tablet Take 1 tablet (25 mg total) by mouth daily. 90 tablet 3  . meloxicam (MOBIC) 15 MG tablet TAKE 1 TABLET BY MOUTH EVERY DAY AS NEEDED FOR PAIN 30 tablet 2  . nystatin (MYCOSTATIN) powder Apply topically 4 (four) times daily. 15  g 0  . pantoprazole (PROTONIX) 40 MG tablet Take 1 tablet (40 mg total) by mouth daily. 30 tablet 3  . pravastatin (PRAVACHOL) 40 MG tablet Take 1 tablet (40 mg total) by mouth daily. 90 tablet 1   No current facility-administered medications for this visit.   Allergies as of 10/21/2014 - Review Complete 10/21/2014  Allergen Reaction Noted  . Statins Other (See Comments) 04/06/2014    reports that she quit smoking about 15 years ago. Her smoking use included Cigarettes. She has a 20 pack-year smoking history. She has never used smokeless tobacco. She reports that she does not drink alcohol or use illicit drugs.   Review of Systems: Pertinent positive and negative review of systems were noted in the above HPI section. All other review of systems were otherwise negative.  Vital signs were reviewed in today's medical record Physical Exam: General: Well developed , well nourished, no  acute distress Skin: anicteric Head: Normocephalic and atraumatic Eyes:  sclerae anicteric, EOMI Ears: Normal auditory acuity Mouth: No deformity or lesions Neck: Supple, no masses or thyromegaly Lymph Nodes: no lymphadenopathy Lungs: Clear throughout to auscultation Heart: Regular rate and rhythm; no murmurs, rubs or bruits Gastroinestinal: Soft, non tender and non distended. No masses, hepatosplenomegaly or hernias noted. Normal Bowel sounds Rectal:deferred Musculoskeletal: Symmetrical with no gross deformities  Skin: No lesions on visible extremities Pulses:  Normal pulses noted Extremities: No clubbing, cyanosis,  or deformities noted.  There is 1+ pedal edema Neurological: Alert oriented x 4, grossly nonfocal Cervical Nodes:  No significant cervical adenopathy Inguinal Nodes: No significant inguinal adenopathy Psychological:  Alert and cooperative. Normal mood and affect  See Assessment and Plan under Problem List

## 2014-10-21 NOTE — Assessment & Plan Note (Signed)
Screening colonoscopy will be scheduled

## 2014-10-21 NOTE — Patient Instructions (Signed)

## 2014-12-09 ENCOUNTER — Encounter: Payer: Self-pay | Admitting: Gastroenterology

## 2014-12-09 ENCOUNTER — Ambulatory Visit (AMBULATORY_SURGERY_CENTER): Payer: Medicare Other | Admitting: Gastroenterology

## 2014-12-09 VITALS — BP 126/52 | HR 66 | Temp 98.4°F | Resp 17 | Ht 60.0 in | Wt 205.0 lb

## 2014-12-09 DIAGNOSIS — R1310 Dysphagia, unspecified: Secondary | ICD-10-CM | POA: Diagnosis present

## 2014-12-09 DIAGNOSIS — Z1211 Encounter for screening for malignant neoplasm of colon: Secondary | ICD-10-CM | POA: Diagnosis not present

## 2014-12-09 DIAGNOSIS — D124 Benign neoplasm of descending colon: Secondary | ICD-10-CM | POA: Diagnosis not present

## 2014-12-09 DIAGNOSIS — J8410 Pulmonary fibrosis, unspecified: Secondary | ICD-10-CM | POA: Diagnosis not present

## 2014-12-09 DIAGNOSIS — D123 Benign neoplasm of transverse colon: Secondary | ICD-10-CM

## 2014-12-09 DIAGNOSIS — D125 Benign neoplasm of sigmoid colon: Secondary | ICD-10-CM

## 2014-12-09 DIAGNOSIS — K222 Esophageal obstruction: Secondary | ICD-10-CM

## 2014-12-09 HISTORY — PX: ESOPHAGOGASTRODUODENOSCOPY: SHX1529

## 2014-12-09 HISTORY — PX: COLONOSCOPY: SHX174

## 2014-12-09 HISTORY — PX: OTHER SURGICAL HISTORY: SHX169

## 2014-12-09 MED ORDER — SODIUM CHLORIDE 0.9 % IV SOLN
500.0000 mL | INTRAVENOUS | Status: DC
Start: 2014-12-09 — End: 2014-12-10

## 2014-12-09 NOTE — Progress Notes (Signed)
Called to room to assist during endoscopic procedure.  Patient ID and intended procedure confirmed with present staff. Received instructions for my participation in the procedure from the performing physician.  

## 2014-12-09 NOTE — Progress Notes (Signed)
Transferred to recovery room. A/O x3, pleased with MAC.  VSS.  Report to Jill, RN. 

## 2014-12-09 NOTE — Patient Instructions (Signed)
YOU HAD AN ENDOSCOPIC PROCEDURE TODAY AT [facility]:   Refer to the procedure report that was given to you for any specific questions about what was found during the examination.  If the procedure report does not answer your questions, please call your gastroenterologist to clarify.  If you requested that your care partner not be given the details of your procedure findings, then the procedure report has been included in a sealed envelope for you to review at your convenience later.  YOU SHOULD EXPECT: Some feelings of bloating in the abdomen. Passage of more gas than usual.  Walking can help get rid of the air that was put into your GI tract during the procedure and reduce the bloating. If you had a lower endoscopy (such as a colonoscopy or flexible sigmoidoscopy) you may notice spotting of blood in your stool or on the toilet paper. If you underwent a bowel prep for your procedure, you may not have a normal bowel movement for a few days.  Please Note:  You might notice some irritation and congestion in your nose or some drainage.  This is from the oxygen used during your procedure.  There is no need for concern and it should clear up in a day or so.  SYMPTOMS TO REPORT IMMEDIATELY:   Following lower endoscopy (colonoscopy or flexible sigmoidoscopy):  Excessive amounts of blood in the stool  Significant tenderness or worsening of abdominal pains  Swelling of the abdomen that is new, acute  Fever of 100F or higher   Following upper endoscopy (EGD)  Vomiting of blood or coffee ground material  New chest pain or pain under the shoulder blades  Painful or persistently difficult swallowing  New shortness of breath  Fever of 100F or higher  Black, tarry-looking stools  For urgent or emergent issues, a gastroenterologist can be reached at any hour by calling [phone].   DIET: Follow Dilation diet  ACTIVITY:  You should plan to take it easy for the rest of today and  you should NOT DRIVE or use heavy machinery until tomorrow (because of the sedation medicines used during the test).    FOLLOW UP: Our staff will call the number listed on your records the next business day following your procedure to check on you and address any questions or concerns that you may have regarding the information given to you following your procedure. If we do not reach you, we will leave a message.  However, if you are feeling well and you are not experiencing any problems, there is no need to return our call.  We will assume that you have returned to your regular daily activities without incident.  If any biopsies were taken you will be contacted by phone or by letter within the next 1-3 weeks.  Please call us at [phone] if you have not heard about the biopsies in 3 weeks.    SIGNATURES/CONFIDENTIALITY: You and/or your care partner have signed paperwork which will be entered into your electronic medical record.  These signatures attest to the fact that that the information above on your After Visit Summary has been reviewed and is understood.  Full responsibility of the confidentiality of this discharge information lies with you and/or your care-partner.  Repeat Colonoscopy in 3 years Avoids NSAIDS for 2 weeks- Advil, Naprosyn, Meloxicam

## 2014-12-09 NOTE — Op Note (Signed)
[place]  [address] [place], [address]   COLONOSCOPY PROCEDURE REPORT  PATIENT: Felicia Martin, Felicia Martin  MR#: [id number] BIRTHDATE: [birth year] , 81  yrs. old GENDER: female ENDOSCOPIST: Inda Castle, MD REFERRED KZ:[id number] Rembrandt, DO PROCEDURE DATE:  12/09/2014 PROCEDURE:   Colonoscopy, screening, Colonoscopy with cold biopsy polypectomy, and Colonoscopy with snare polypectomy First Screening Colonoscopy - Avg.  risk and is 50 yrs.  old or older Yes.  Prior Negative Screening - Now for repeat screening. N/A  History of Adenoma - Now for follow-up colonoscopy & has been > or = to 3 yrs.  N/A  Polyps removed today? Yes ASA CLASS:   Class II INDICATIONS:Colorectal Neoplasm Risk Assessment for this procedure is average risk. MEDICATIONS: Propofol 250 mg IV  DESCRIPTION OF PROCEDURE:   After the risks benefits and alternatives of the procedure were thoroughly explained, informed consent was obtained.  The digital rectal exam revealed no abnormalities of the rectum.   The LB [id number]  endoscope was introduced through the anus and advanced to the cecum, which was identified by both the appendix and ileocecal valve. No adverse events experienced.   The quality of the prep was (Suprep was used) excellent.  The instrument was then slowly withdrawn as the colon was fully examined. Estimated blood loss is zero unless otherwise noted in this procedure report.      COLON FINDINGS: A sessile polyp measuring 8 mm in size was found at the splenic flexure.  A polypectomy was performed with a cold snare.  The resection was complete, the polyp tissue was completely retrieved and sent to histology.   A sessile polyp measuring 15 mm in size was found in the descending colon.  A polypectomy was performed with a cold snare.  The resection was complete, the polyp tissue was completely retrieved and sent to histology.   A sessile polyp measuring 2 mm in size was found  in the descending colon.  A polypectomy was performed with cold forceps.  A polypectomy was performed with a cold snare.  The resection was complete, the polyp tissue was completely retrieved and sent to histology.   Two sessile polyps ranging from 2 to 6mm in size were found in the transverse colon.  A polypectomy was performed with cold forceps. Retroflexed views revealed no abnormalities. The time to cecum = 3.1 Withdrawal time = 14.3   The scope was withdrawn and the procedure completed. COMPLICATIONS: There were no immediate complications.  ENDOSCOPIC IMPRESSION: Colonic polyposis  RECOMMENDATIONS: repeat colonoscopy 3 years pending all for all medical status avoid NSAIDs for 2 weeks  eSigned:  Inda Castle, MD 12/09/2014 2:46 PM   cc:   PATIENT NAME:  Felicia Martin, Felicia Martin MR#: [id number]

## 2014-12-09 NOTE — Op Note (Signed)
[place]  [address]. [place], [address]   ENDOSCOPY PROCEDURE REPORT  PATIENT: Felicia Martin, Felicia Martin  MR#: [id number] BIRTHDATE: 09-Jul-1932 , 81  yrs. old GENDER: female ENDOSCOPIST: Inda Castle, MD REFERRED BY:  Garnet Koyanagi, DO PROCEDURE DATE:  12/09/2014 PROCEDURE:  EGD, diagnostic and Maloney dilation of esophagus ASA CLASS:     Class II INDICATIONS:  dysphagia. MEDICATIONS: Residual sedation present, Monitored anesthesia care, and Propofol 150 mg IV TOPICAL ANESTHETIC:  DESCRIPTION OF PROCEDURE: After the risks benefits and alternatives of the procedure were thoroughly explained, informed consent was obtained.  The LB [id number] endoscope was introduced through the mouth and advanced to the second portion of the duodenum , Without limitations.  The instrument was slowly withdrawn as the mucosa was fully examined.      ESOPHAGUS: There was a fibrotic stricture at the gastroesophageal junction.  The stricture was traversable.  The stricture was dilated using a 17.62mm (52Fr) Maloney dilator. There was moderate resistance and no heme. There was a 4 cm sliding hiatal hernia. The remainder exam was normal.Retroflexed views revealed no abnormalities.     The scope was then withdrawn from the patient and the procedure completed.  COMPLICATIONS: There were no immediate complications.  ENDOSCOPIC IMPRESSION: esophageal stricture?"status post [facility] dilation  RECOMMENDATIONS: repeat dilation as needed  REPEAT EXAM:  eSigned:  Inda Castle, MD 12/09/2014 2:50 PM    CC:

## 2014-12-10 ENCOUNTER — Telehealth: Payer: Self-pay | Admitting: *Deleted

## 2014-12-10 NOTE — Telephone Encounter (Signed)
  Follow up Call-  Call back number 12/09/2014  Post procedure Call Back phone  # [phone]  Permission to leave phone message Yes     Patient questions:  Do you have a fever, pain , or abdominal swelling? No. Pain Score  0 *  Have you tolerated food without any problems? Yes.    Have you been able to return to your normal activities? Yes.    Do you have any questions about your discharge instructions: Diet   No. Medications  No. Follow up visit  No.  Do you have questions or concerns about your Care? No.  Actions: * If pain score is 4 or above: No action needed, pain <4.

## 2014-12-14 ENCOUNTER — Encounter: Payer: Self-pay | Admitting: Gastroenterology

## 2015-02-01 ENCOUNTER — Telehealth: Payer: Self-pay | Admitting: Family Medicine

## 2015-02-01 NOTE — Telephone Encounter (Signed)
VM 11/14 3:09pm to RS appt from 02/16/15 - left msg for pt to call back to schedule

## 2015-02-08 ENCOUNTER — Telehealth: Payer: Self-pay

## 2015-02-08 DIAGNOSIS — R739 Hyperglycemia, unspecified: Secondary | ICD-10-CM

## 2015-02-08 DIAGNOSIS — E785 Hyperlipidemia, unspecified: Secondary | ICD-10-CM

## 2015-02-08 NOTE — Telephone Encounter (Signed)
Labs have been ordered.      KP

## 2015-02-08 NOTE — Telephone Encounter (Signed)
-----   Message from Quintin Alto sent at 02/07/2015 12:08 PM EST ----- Regarding: Labs Contact: [phone] Please enter orders for labs that patient need. States she was to have follow up labs/per Dr. Etter Sjogren. Last labs were in July 2016. Patient will be in on Dec 7.

## 2015-02-16 ENCOUNTER — Ambulatory Visit: Payer: Medicare Other

## 2015-02-22 ENCOUNTER — Telehealth: Payer: Self-pay | Admitting: Behavioral Health

## 2015-02-22 NOTE — Telephone Encounter (Signed)
Left message for patient to return call when available to confirm appointment for 02/23/15 at 8:00 AM for Medicare Wellness visit.

## 2015-02-23 ENCOUNTER — Ambulatory Visit (INDEPENDENT_AMBULATORY_CARE_PROVIDER_SITE_OTHER): Payer: Medicare Other

## 2015-02-23 ENCOUNTER — Other Ambulatory Visit: Payer: Medicare Other

## 2015-02-23 ENCOUNTER — Other Ambulatory Visit (INDEPENDENT_AMBULATORY_CARE_PROVIDER_SITE_OTHER): Payer: Medicare Other

## 2015-02-23 ENCOUNTER — Ambulatory Visit: Payer: Medicare Other

## 2015-02-23 VITALS — BP 130/80 | HR 68 | Ht 60.0 in | Wt 203.6 lb

## 2015-02-23 DIAGNOSIS — Z0000 Encounter for general adult medical examination without abnormal findings: Secondary | ICD-10-CM | POA: Diagnosis not present

## 2015-02-23 DIAGNOSIS — R739 Hyperglycemia, unspecified: Secondary | ICD-10-CM

## 2015-02-23 DIAGNOSIS — E785 Hyperlipidemia, unspecified: Secondary | ICD-10-CM | POA: Diagnosis not present

## 2015-02-23 LAB — LIPID PANEL
CHOL/HDL RATIO: 6
Cholesterol: 273 mg/dL — ABNORMAL HIGH (ref 0–200)
HDL: 47 mg/dL (ref >39.00)
LDL CALC: 201 mg/dL — AB (ref 0–99)
NONHDL: 225.58
Triglycerides: 123 mg/dL (ref 0.0–149.0)
VLDL: 24.6 mg/dL (ref 0.0–40.0)

## 2015-02-23 LAB — COMPREHENSIVE METABOLIC PANEL
ALT: 7 U/L (ref 0–35)
AST: 14 U/L (ref 0–37)
Albumin: 3.9 g/dL (ref 3.5–5.2)
Alkaline Phosphatase: 68 U/L (ref 39–117)
BUN: 21 mg/dL (ref 6–23)
CALCIUM: 9.7 mg/dL (ref 8.4–10.5)
CO2: 26 meq/L (ref 19–32)
Chloride: 105 mEq/L (ref 96–112)
Creatinine, Ser: 0.77 mg/dL (ref 0.40–1.20)
GFR: 92.74 mL/min (ref >60.00)
GLUCOSE: 88 mg/dL (ref 70–99)
POTASSIUM: 3.9 meq/L (ref 3.5–5.1)
Sodium: 139 mEq/L (ref 135–145)
Total Bilirubin: 0.3 mg/dL (ref 0.2–1.2)
Total Protein: 8.3 g/dL (ref 6.0–8.3)

## 2015-02-23 LAB — HEMOGLOBIN A1C: Hgb A1c MFr Bld: 6.6 % — ABNORMAL HIGH (ref 4.6–6.5)

## 2015-02-23 NOTE — Progress Notes (Signed)
Pre visit review using our clinic review tool, if applicable. No additional management support is needed unless otherwise documented below in the visit note. 

## 2015-02-23 NOTE — Patient Instructions (Addendum)
Remember to go to lab for blood work.    Eat heart healthy diet (full of fruits, vegetables, whole grains, lean protein, water--limit salt, fat, and sugar intake) and increase physical activity as tolerated.  Continue doing brain stimulating activities (puzzles, reading, adult coloring books, staying active) to keep memory sharp.   Follow up with Dr. Etter Sjogren as scheduled.     Fat and Cholesterol Restricted Diet High levels of fat and cholesterol in your blood may lead to various health problems, such as diseases of the heart, blood vessels, gallbladder, liver, and pancreas. Fats are concentrated sources of energy that come in various forms. Certain types of fat, including saturated fat, may be harmful in excess. Cholesterol is a substance needed by your body in small amounts. Your body makes all the cholesterol it needs. Excess cholesterol comes from the food you eat. When you have high levels of cholesterol and saturated fat in your blood, health problems can develop because the excess fat and cholesterol will gather along the walls of your blood vessels, causing them to narrow. Choosing the right foods will help you control your intake of fat and cholesterol. This will help keep the levels of these substances in your blood within normal limits and reduce your risk of disease. WHAT IS MY PLAN? Your health care provider recommends that you:  Get no more than __________ % of the total calories in your daily diet from fat.  Limit your intake of saturated fat to less than ______% of your total calories each day.  Limit the amount of cholesterol in your diet to less than _________mg per day. WHAT TYPES OF FAT SHOULD I CHOOSE?  Choose healthy fats more often. Choose monounsaturated and polyunsaturated fats, such as olive and canola oil, flaxseeds, walnuts, almonds, and seeds.  Eat more omega-3 fats. Good choices include salmon, mackerel, sardines, tuna, flaxseed oil, and ground flaxseeds. Aim to eat  fish at least two times a week.  Limit saturated fats. Saturated fats are primarily found in animal products, such as meats, butter, and cream. Plant sources of saturated fats include palm oil, palm kernel oil, and coconut oil.  Avoid foods with partially hydrogenated oils in them. These contain trans fats. Examples of foods that contain trans fats are stick margarine, some tub margarines, cookies, crackers, and other baked goods. WHAT GENERAL GUIDELINES DO I NEED TO FOLLOW? These guidelines for healthy eating will help you control your intake of fat and cholesterol:  Check food labels carefully to identify foods with trans fats or high amounts of saturated fat.  Fill one half of your plate with vegetables and green salads.  Fill one fourth of your plate with whole grains. Look for the word "whole" as the first word in the ingredient list.  Fill one fourth of your plate with lean protein foods.  Limit fruit to two servings a day. Choose fruit instead of juice.  Eat more foods that contain soluble fiber. Examples of foods that contain this type of fiber are apples, broccoli, carrots, beans, peas, and barley. Aim to get 20-30 g of fiber per day.  Eat more home-cooked food and less restaurant, buffet, and fast food.  Limit or avoid alcohol.  Limit foods high in starch and sugar.  Limit fried foods.  Cook foods using methods other than frying. Baking, boiling, grilling, and broiling are all great options.  Lose weight if you are overweight. Losing just 5-10% of your initial body weight can help your overall health and prevent  diseases such as diabetes and heart disease. WHAT FOODS CAN I EAT? Grains Whole grains, such as whole wheat or whole grain breads, crackers, cereals, and pasta. Unsweetened oatmeal, bulgur, barley, quinoa, or brown rice. Corn or whole wheat flour tortillas. Vegetables Fresh or frozen vegetables (raw, steamed, roasted, or grilled). Green salads. Fruits All fresh,  canned (in natural juice), or frozen fruits. Meat and Other Protein Products Ground beef (85% or leaner), grass-fed beef, or beef trimmed of fat. Skinless chicken or [place]. Ground chicken or [place]. Pork trimmed of fat. All fish and seafood. Eggs. Dried beans, peas, or lentils. Unsalted nuts or seeds. Unsalted canned or dry beans. Dairy Low-fat dairy products, such as skim or 1% milk, 2% or reduced-fat cheeses, low-fat ricotta or cottage cheese, or plain low-fat yogurt. Fats and Oils Tub margarines without trans fats. Light or reduced-fat mayonnaise and salad dressings. Avocado. Olive, canola, sesame, or safflower oils. Natural peanut or almond butter (choose ones without added sugar and oil). The items listed above may not be a complete list of recommended foods or beverages. Contact your dietitian for more options. WHAT FOODS ARE NOT RECOMMENDED? Grains White bread. White pasta. White rice. Cornbread. Bagels, pastries, and croissants. Crackers that contain trans fat. Vegetables White potatoes. Corn. Creamed or fried vegetables. Vegetables in a cheese sauce. Fruits Dried fruits. Canned fruit in light or heavy syrup. Fruit juice. Meat and Other Protein Products Fatty cuts of meat. Ribs, chicken wings, bacon, sausage, bologna, salami, chitterlings, fatback, hot dogs, bratwurst, and packaged luncheon meats. Liver and organ meats. Dairy Whole or 2% milk, cream, half-and-half, and cream cheese. Whole milk cheeses. Whole-fat or sweetened yogurt. Full-fat cheeses. Nondairy creamers and whipped toppings. Processed cheese, cheese spreads, or cheese curds. Sweets and Desserts Corn syrup, sugars, honey, and molasses. Candy. Jam and jelly. Syrup. Sweetened cereals. Cookies, pies, cakes, donuts, muffins, and ice cream. Fats and Oils Butter, stick margarine, lard, shortening, ghee, or bacon fat. Coconut, palm kernel, or palm oils. Beverages Alcohol. Sweetened drinks (such as sodas, lemonade, and fruit  drinks or punches). The items listed above may not be a complete list of foods and beverages to avoid. Contact your dietitian for more information.   This information is not intended to replace advice given to you by your health care provider. Make sure you discuss any questions you have with your health care provider.   Document Released: 03/05/2005 Document Revised: 03/26/2014 Document Reviewed: 06/03/2013 Elsevier Interactive Patient Education 2016 Elsevier Inc.  Fat and Cholesterol Restricted Diet Getting too much fat and cholesterol in your diet may cause health problems. Following this diet helps keep your fat and cholesterol at normal levels. This can keep you from getting sick. WHAT TYPES OF FAT SHOULD I CHOOSE?  Choose monosaturated and polyunsaturated fats. These are found in foods such as olive oil, canola oil, flaxseeds, walnuts, almonds, and seeds.  Eat more omega-3 fats. Good choices include salmon, mackerel, sardines, tuna, flaxseed oil, and ground flaxseeds.  Limit saturated fats. These are in animal products such as meats, butter, and cream. They can also be in plant products such as palm oil, palm kernel oil, and coconut oil.   Avoid foods with partially hydrogenated oils in them. These contain trans fats. Examples of foods that have trans fats are stick margarine, some tub margarines, cookies, crackers, and other baked goods. WHAT GENERAL GUIDELINES DO I NEED TO FOLLOW?   Check food labels. Look for the words "trans fat" and "saturated fat."  When preparing a meal:  Fill half of your plate with vegetables and green salads.  Fill one fourth of your plate with whole grains. Look for the word "whole" as the first word in the ingredient list.  Fill one fourth of your plate with lean protein foods.  Limit fruit to two servings a day. Choose fruit instead of juice.  Eat more foods with soluble fiber. Examples of foods with this type of fiber are apples, broccoli,  carrots, beans, peas, and barley. Try to get 20-30 g (grams) of fiber per day.  Eat more home-cooked foods. Eat less at restaurants and buffets.  Limit or avoid alcohol.  Limit foods high in starch and sugar.  Limit fried foods.  Cook foods without frying them. Baking, boiling, grilling, and broiling are all great options.  Lose weight if you are overweight. Losing even a small amount of weight can help your overall health. It can also help prevent diseases such as diabetes and heart disease. WHAT FOODS CAN I EAT? Grains Whole grains, such as whole wheat or whole grain breads, crackers, cereals, and pasta. Unsweetened oatmeal, bulgur, barley, quinoa, or brown rice. Corn or whole wheat flour tortillas. Vegetables Fresh or frozen vegetables (raw, steamed, roasted, or grilled). Green salads. Fruits All fresh, canned (in natural juice), or frozen fruits. Meat and Other Protein Products Ground beef (85% or leaner), grass-fed beef, or beef trimmed of fat. Skinless chicken or [place]. Ground chicken or [place]. Pork trimmed of fat. All fish and seafood. Eggs. Dried beans, peas, or lentils. Unsalted nuts or seeds. Unsalted canned or dry beans. Dairy Low-fat dairy products, such as skim or 1% milk, 2% or reduced-fat cheeses, low-fat ricotta or cottage cheese, or plain low-fat yogurt. Fats and Oils Tub margarines without trans fats. Light or reduced-fat mayonnaise and salad dressings. Avocado. Olive, canola, sesame, or safflower oils. Natural peanut or almond butter (choose ones without added sugar and oil). The items listed above may not be a complete list of recommended foods or beverages. Contact your dietitian for more options. WHAT FOODS ARE NOT RECOMMENDED? Grains White bread. White pasta. White rice. Cornbread. Bagels, pastries, and croissants. Crackers that contain trans fat. Vegetables White potatoes. Corn. Creamed or fried vegetables. Vegetables in a cheese sauce. Fruits Dried fruits.  Canned fruit in light or heavy syrup. Fruit juice. Meat and Other Protein Products Fatty cuts of meat. Ribs, chicken wings, bacon, sausage, bologna, salami, chitterlings, fatback, hot dogs, bratwurst, and packaged luncheon meats. Liver and organ meats. Dairy Whole or 2% milk, cream, half-and-half, and cream cheese. Whole milk cheeses. Whole-fat or sweetened yogurt. Full-fat cheeses. Nondairy creamers and whipped toppings. Processed cheese, cheese spreads, or cheese curds. Sweets and Desserts Corn syrup, sugars, honey, and molasses. Candy. Jam and jelly. Syrup. Sweetened cereals. Cookies, pies, cakes, donuts, muffins, and ice cream. Fats and Oils Butter, stick margarine, lard, shortening, ghee, or bacon fat. Coconut, palm kernel, or palm oils. Beverages Alcohol. Sweetened drinks (such as sodas, lemonade, and fruit drinks or punches). The items listed above may not be a complete list of foods and beverages to avoid. Contact your dietitian for more information.   This information is not intended to replace advice given to you by your health care provider. Make sure you discuss any questions you have with your health care provider.   Document Released: 09/04/2011 Document Revised: 03/26/2014 Document Reviewed: 06/04/2013 Elsevier Interactive Patient Education [employer].

## 2015-02-23 NOTE — Progress Notes (Addendum)
Subjective:   Felicia Martin is a 81 y.o. female who presents for Medicare Annual (Subsequent) preventive examination.  Review of Systems: No ROS  Cardiac Risk Factors include: obesity (BMI >30kg/m2);advanced age (>46men, >38 women);hypertension;dyslipidemia   Sleep patterns: Sleeps at least 8 hours per night/does not wake up during the night to void.  Does when she takes HCTZ.   Home Safety/Smoke Alarms:  Feels safe at home.  Lives alone in 1 level home.  Smoke alarms present.   Alarm system present.   Firearm Safety: No firearms.   Seat Belt Safety/Bike Helmet:  Always wears seat belt.   Sun exposure:  Limited sun exposure.    Counseling:   Eye Exam- Fall 2015 Dental- Wears dentures.   Last dental exam: 2 years ago.   Female:  Mammo-07/15/14-negative      Dexa scan-10/12/09- osteopenia       CCS-12/09/14-multiple polyps; repeat in 3 years--Dr. Deatra Ina     Objective:     Vitals: BP 130/80 mmHg  Pulse 68  Ht 5' (1.524 m)  Wt 203 lb 9.6 oz (92.352 kg)  BMI 39.76 kg/m2  SpO2 98%  LMP 03/17/2014  Tobacco History  Smoking status  . Former Smoker -- 0.50 packs/day for 40 years  . Types: Cigarettes  . Quit date: 05/25/1999  Smokeless tobacco  . Never Used     Counseling given: No   Past Medical History  Diagnosis Date  . Pulmonary fibrosis ([place])   . Sarcoidosis ([place])   . Asthma   . Hypercholesterolemia   . GERD (gastroesophageal reflux disease)   . Hypertension   . Surgical history of tubal ligation 1960s   Past Surgical History  Procedure Laterality Date  . Lung surgery  1960    Right lung  . Tubal ligation  1960  . Maloney dilation  12/09/14  . Colonoscopy  12/09/14  . Esophagogastroduodenoscopy  12/09/14   Family History  Problem Relation Age of Onset  . Hypertension Sister     x's 3  . Diabetes Sister     x's 3  . Hyperlipidemia    . Liver cancer Mother   . Liver cancer Father   . Cancer Father   . Cancer Brother   . Diabetes Brother    History   Sexual Activity  . Sexual Activity: No    Outpatient Encounter Prescriptions as of 02/23/2015  Medication Sig  . albuterol (PROVENTIL HFA;VENTOLIN HFA) 108 (90 BASE) MCG/ACT inhaler Inhale 2 puffs into the lungs every 6 (six) hours as needed.  Marland Kitchen amLODipine (NORVASC) 5 MG tablet Take 1 tablet (5 mg total) by mouth daily.  Marland Kitchen aspirin 81 MG tablet Take 81 mg by mouth daily.  . Calcium Carbonate-Vitamin D (CALCIUM-VITAMIN D) 500-200 MG-UNIT per tablet Take 1 tablet by mouth 2 (two) times daily with a meal.  . hydrochlorothiazide (HYDRODIURIL) 25 MG tablet Take 1 tablet (25 mg total) by mouth daily.  . beclomethasone (QVAR) 40 MCG/ACT inhaler Inhale 2 puffs into the lungs 2 (two) times daily. (Patient not taking: Reported on 02/23/2015)  . meloxicam (MOBIC) 15 MG tablet TAKE 1 TABLET BY MOUTH EVERY DAY AS NEEDED FOR PAIN (Patient not taking: Reported on 02/23/2015)  . pantoprazole (PROTONIX) 40 MG tablet Take 1 tablet (40 mg total) by mouth daily. (Patient not taking: Reported on 02/23/2015)  . [DISCONTINUED] nystatin (MYCOSTATIN) powder Apply topically 4 (four) times daily. (Patient not taking: Reported on 12/09/2014)  . [DISCONTINUED] pravastatin (PRAVACHOL) 40 MG tablet Take 1  tablet (40 mg total) by mouth daily. (Patient not taking: Reported on 02/23/2015)   No facility-administered encounter medications on file as of 02/23/2015.    Activities of Daily Living In your present state of health, do you have any difficulty performing the following activities: 02/23/2015 04/06/2014  Hearing? Y N  Vision? Y N  Difficulty concentrating or making decisions? N N  Walking or climbing stairs? Y N  Dressing or bathing? N N  Doing errands, shopping? N N  Preparing Food and eating ? N -  Using the Toilet? N -  In the past six months, have you accidently leaked urine? N -  Do you have problems with loss of bowel control? N -  Managing your Medications? N -  Managing your Finances? N -  Housekeeping or  managing your Housekeeping? 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  Eats fruit for breakfast, sandwiches for lunch, eats out for dinner at [employer] and [employer].  Eats lots of salad.  Has cut back on carbs and fried foods.    Goals    . Continue to improve diet.      . Lower cholesterol      Fall Risk Fall Risk  02/23/2015 12/07/2013 12/07/2013 08/06/2012  Falls in the past year? No No No No  Risk for fall due to : History of fall(s) - - -  Risk for fall due to (comments): [place] 2 years ago climbing up on something; no injury.   - - -   Depression Screen PHQ 2/9 Scores 02/23/2015 12/07/2013 12/07/2013 08/06/2012  PHQ - 2 Score 0 0 0 0     Cognitive Testing MMSE - Mini Mental State Exam 02/23/2015  Orientation to time 5  Orientation to Place 5  Registration 3  Attention/ Calculation 5  Recall 3  Language- name 2 objects 2  Language- repeat 1  Language- follow 3 step command 3  Language- read & follow direction 1  Write a sentence 1  Copy design 1  Total score 30    Immunization History  Administered Date(s) Administered  . Td 03/19/2002   Screening Tests Health Maintenance  Topic Date Due  . TETANUS/TDAP  03/19/2012  . PNA vac Low Risk Adult (1 of 2 - PCV13) 03/18/2015 (Originally 01/23/2000)  . INFLUENZA VACCINE  04/07/2015 (Originally 10/18/2014)  . MAMMOGRAM  07/15/2015  . COLONOSCOPY  12/08/2017  . DEXA SCAN  Completed  . ZOSTAVAX  Addressed      Plan:  Remember to go  to lab for blood work.    Eat heart healthy diet (full of fruits, vegetables, whole grains, lean protein, water--limit salt, fat, and sugar intake) and increase physical activity as tolerated.  Continue doing brain stimulating activities (puzzles, reading, adult coloring books, staying active) to keep memory sharp.   Follow up with Dr. Etter Sjogren as scheduled.     During the course of the visit the patient was educated and counseled about the following appropriate screening and preventive services:   Vaccines to include Pneumoccal, Influenza, Hepatitis B, Td,  Zostavax, HCV  Electrocardiogram  Cardiovascular Disease  Colorectal cancer screening  Bone density screening  Diabetes screening  Glaucoma screening  Mammography/PAP  Nutrition counseling   Patient Instructions (the written plan) was given to the patient.   Rudene Anda, RN  02/23/2015  Agree Linard Millers.D.

## 2015-03-02 DIAGNOSIS — H25013 Cortical age-related cataract, bilateral: Secondary | ICD-10-CM | POA: Diagnosis not present

## 2015-03-04 ENCOUNTER — Other Ambulatory Visit: Payer: Self-pay

## 2015-03-04 ENCOUNTER — Telehealth: Payer: Self-pay | Admitting: Family Medicine

## 2015-03-04 MED ORDER — EZETIMIBE 10 MG PO TABS: 10.0000 mg | ORAL_TABLET | Freq: Every day | ORAL | Status: DC

## 2015-03-04 NOTE — Telephone Encounter (Signed)
error:[id number] ° °

## 2015-03-07 ENCOUNTER — Telehealth: Payer: Self-pay | Admitting: *Deleted

## 2015-03-07 NOTE — Telephone Encounter (Signed)
Received fax from pharmacy requesting PA for Zetia, initiated PA via Cover My Meds, awaiting Approval/SLS

## 2015-03-08 NOTE — Telephone Encounter (Signed)
PA approved through 03/18/2016. Approval letter sent for scanning. JG//CMA 

## 2015-03-08 NOTE — Telephone Encounter (Signed)
Additional information faxed to [facility]. Awaiting determination. JG//CMA

## 2015-03-23 DIAGNOSIS — H2513 Age-related nuclear cataract, bilateral: Secondary | ICD-10-CM | POA: Diagnosis not present

## 2015-03-23 DIAGNOSIS — H1851 Endothelial corneal dystrophy: Secondary | ICD-10-CM | POA: Diagnosis not present

## 2015-03-28 ENCOUNTER — Ambulatory Visit: Payer: Medicare Other | Admitting: Family Medicine

## 2015-04-08 ENCOUNTER — Encounter: Payer: Self-pay | Admitting: Family Medicine

## 2015-04-08 ENCOUNTER — Ambulatory Visit (INDEPENDENT_AMBULATORY_CARE_PROVIDER_SITE_OTHER): Payer: Medicare Other | Admitting: Family Medicine

## 2015-04-08 VITALS — BP 138/86 | HR 75 | Temp 97.6°F | Ht 60.0 in | Wt 202.6 lb

## 2015-04-08 DIAGNOSIS — R1314 Dysphagia, pharyngoesophageal phase: Secondary | ICD-10-CM | POA: Diagnosis not present

## 2015-04-08 DIAGNOSIS — M5135 Other intervertebral disc degeneration, thoracolumbar region: Secondary | ICD-10-CM | POA: Diagnosis not present

## 2015-04-08 DIAGNOSIS — E785 Hyperlipidemia, unspecified: Secondary | ICD-10-CM | POA: Diagnosis not present

## 2015-04-08 DIAGNOSIS — I159 Secondary hypertension, unspecified: Secondary | ICD-10-CM | POA: Diagnosis not present

## 2015-04-08 DIAGNOSIS — I10 Essential (primary) hypertension: Secondary | ICD-10-CM

## 2015-04-08 MED ORDER — PANTOPRAZOLE SODIUM 40 MG PO TBEC: 40.0000 mg | DELAYED_RELEASE_TABLET | Freq: Every day | ORAL | Status: DC

## 2015-04-08 MED ORDER — AMLODIPINE BESYLATE 5 MG PO TABS: 5.0000 mg | ORAL_TABLET | Freq: Every day | ORAL | Status: DC

## 2015-04-08 MED ORDER — EZETIMIBE 10 MG PO TABS: 10.0000 mg | ORAL_TABLET | Freq: Every day | ORAL | Status: DC

## 2015-04-08 MED ORDER — MELOXICAM 15 MG PO TABS: ORAL_TABLET | ORAL | Status: DC

## 2015-04-08 NOTE — Patient Instructions (Signed)
Hypertension Hypertension, commonly called high blood pressure, is when the force of blood pumping through your arteries is too strong. Your arteries are the blood vessels that carry blood from your heart throughout your body. A blood pressure reading consists of a higher number over a lower number, such as 110/72. The higher number (systolic) is the pressure inside your arteries when your heart pumps. The lower number (diastolic) is the pressure inside your arteries when your heart relaxes. Ideally you want your blood pressure below 120/80. Hypertension forces your heart to work harder to pump blood. Your arteries may become narrow or stiff. Having untreated or uncontrolled hypertension can cause heart attack, stroke, kidney disease, and other problems. RISK FACTORS Some risk factors for high blood pressure are controllable. Others are not.  Risk factors you cannot control include:   Race. You may be at higher risk if you are African American.  Age. Risk increases with age.  Gender. Men are at higher risk than women before age 45 years. After age 65, women are at higher risk than men. Risk factors you can control include:  Not getting enough exercise or physical activity.  Being overweight.  Getting too much fat, sugar, calories, or salt in your diet.  Drinking too much alcohol. SIGNS AND SYMPTOMS Hypertension does not usually cause signs or symptoms. Extremely high blood pressure (hypertensive crisis) may cause headache, anxiety, shortness of breath, and nosebleed. DIAGNOSIS To check if you have hypertension, your health care provider will measure your blood pressure while you are seated, with your arm held at the level of your heart. It should be measured at least twice using the same arm. Certain conditions can cause a difference in blood pressure between your right and left arms. A blood pressure reading that is higher than normal on one occasion does not mean that you need treatment. If  it is not clear whether you have high blood pressure, you may be asked to return on a different day to have your blood pressure checked again. Or, you may be asked to monitor your blood pressure at home for 1 or more weeks. TREATMENT Treating high blood pressure includes making lifestyle changes and possibly taking medicine. Living a healthy lifestyle can help lower high blood pressure. You may need to change some of your habits. Lifestyle changes may include:  Following the DASH diet. This diet is high in fruits, vegetables, and whole grains. It is low in salt, red meat, and added sugars.  Keep your sodium intake below 2,300 mg per day.  Getting at least 30-45 minutes of aerobic exercise at least 4 times per week.  Losing weight if necessary.  Not smoking.  Limiting alcoholic beverages.  Learning ways to reduce stress. Your health care provider may prescribe medicine if lifestyle changes are not enough to get your blood pressure under control, and if one of the following is true:  You are 18-59 years of age and your systolic blood pressure is above 140.  You are 60 years of age or older, and your systolic blood pressure is above 150.  Your diastolic blood pressure is above 90.  You have diabetes, and your systolic blood pressure is over 140 or your diastolic blood pressure is over 90.  You have kidney disease and your blood pressure is above 140/90.  You have heart disease and your blood pressure is above 140/90. Your personal target blood pressure may vary depending on your medical conditions, your age, and other factors. HOME CARE INSTRUCTIONS    Have your blood pressure rechecked as directed by your health care provider.   Take medicines only as directed by your health care provider. Follow the directions carefully. Blood pressure medicines must be taken as prescribed. The medicine does not work as well when you skip doses. Skipping doses also puts you at risk for  problems.  Do not smoke.   Monitor your blood pressure at home as directed by your health care provider. SEEK MEDICAL CARE IF:   You think you are having a reaction to medicines taken.  You have recurrent headaches or feel dizzy.  You have swelling in your ankles.  You have trouble with your vision. SEEK IMMEDIATE MEDICAL CARE IF:  You develop a severe headache or confusion.  You have unusual weakness, numbness, or feel faint.  You have severe chest or abdominal pain.  You vomit repeatedly.  You have trouble breathing. MAKE SURE YOU:   Understand these instructions.  Will watch your condition.  Will get help right away if you are not doing well or get worse.   This information is not intended to replace advice given to you by your health care provider. Make sure you discuss any questions you have with your health care provider.   Document Released: 03/05/2005 Document Revised: 07/20/2014 Document Reviewed: 12/26/2012 Elsevier Interactive Patient Education 2016 Elsevier Inc.  

## 2015-04-08 NOTE — Progress Notes (Signed)
Patient ID: Felicia Martin, female    DOB: Aug 06, 1932  Age: 82 y.o. MRN: [id number]    Subjective:  Subjective HPI Felicia Martin presents for htn and choleseterol.  She also needs refills on her protonix and mobic.  No new complaints.   Review of Systems  Constitutional: Negative for diaphoresis, appetite change, fatigue and unexpected weight change.  Eyes: Negative for pain, redness and visual disturbance.  Respiratory: Negative for cough, chest tightness, shortness of breath and wheezing.   Cardiovascular: Negative for chest pain, palpitations and leg swelling.  Endocrine: Negative for cold intolerance, heat intolerance, polydipsia, polyphagia and polyuria.  Genitourinary: Negative for dysuria, frequency and difficulty urinating.  Neurological: Negative for dizziness, light-headedness, numbness and headaches.    History Past Medical History  Diagnosis Date  . Pulmonary fibrosis ([place])   . Sarcoidosis ([place])   . Asthma   . Hypercholesterolemia   . GERD (gastroesophageal reflux disease)   . Hypertension   . Surgical history of tubal ligation 1960s    She has past surgical history that includes Lung surgery (1960); Tubal ligation (1960); Maloney dilation (12/09/14); Colonoscopy (12/09/14); and Esophagogastroduodenoscopy (12/09/14).   Her family history includes Cancer in her brother and father; Diabetes in her brother and sister; Hypertension in her sister; Liver cancer in her father and mother.She reports that she quit smoking about 15 years ago. Her smoking use included Cigarettes. She has a 20 pack-year smoking history. She has never used smokeless tobacco. She reports that she does not drink alcohol or use illicit drugs.  Current Outpatient Prescriptions on File Prior to Visit  Medication Sig Dispense Refill  . albuterol (PROVENTIL HFA;VENTOLIN HFA) 108 (90 BASE) MCG/ACT inhaler Inhale 2 puffs into the lungs every 6 (six) hours as needed. 1 Inhaler 1  . aspirin 81 MG tablet  Take 81 mg by mouth daily.    . beclomethasone (QVAR) 40 MCG/ACT inhaler Inhale 2 puffs into the lungs 2 (two) times daily. 1 Inhaler 12  . Calcium Carbonate-Vitamin D (CALCIUM-VITAMIN D) 500-200 MG-UNIT per tablet Take 1 tablet by mouth 2 (two) times daily with a meal.    . hydrochlorothiazide (HYDRODIURIL) 25 MG tablet Take 1 tablet (25 mg total) by mouth daily. 90 tablet 3   No current facility-administered medications on file prior to visit.     Objective:  Objective Physical Exam  Constitutional: She is oriented to person, place, and time. She appears well-developed and well-nourished.  HENT:  Head: Normocephalic and atraumatic.  Eyes: Conjunctivae and EOM are normal.  Neck: Normal range of motion. Neck supple. No JVD present. Carotid bruit is not present. No thyromegaly present.  Cardiovascular: Normal rate, regular rhythm and normal heart sounds.   No murmur heard. Pulmonary/Chest: Effort normal and breath sounds normal. No respiratory distress. She has no wheezes. She has no rales. She exhibits no tenderness.  Musculoskeletal: She exhibits no edema.  Neurological: She is alert and oriented to person, place, and time.  Psychiatric: She has a normal mood and affect. Her behavior is normal.  Nursing note and vitals reviewed.  BP 138/86 mmHg  Pulse 75  Temp(Src) 97.6 F (36.4 C) (Oral)  Ht 5' (1.524 m)  Wt 202 lb 9.6 oz (91.899 kg)  BMI 39.57 kg/m2  SpO2 98%  LMP 03/17/2014 Wt Readings from Last 3 Encounters:  04/08/15 202 lb 9.6 oz (91.899 kg)  02/23/15 203 lb 9.6 oz (92.352 kg)  12/09/14 205 lb (92.987 kg)     Lab Results  Component Value  Date   WBC 4.4 12/16/2013   HGB 12.2 12/16/2013   HCT 36.7 12/16/2013   PLT 279.0 12/16/2013   GLUCOSE 88 02/23/2015   CHOL 273* 02/23/2015   TRIG 123.0 02/23/2015   HDL 47.00 02/23/2015   LDLDIRECT 230.7 03/18/2013   LDLCALC 201* 02/23/2015   ALT 7 02/23/2015   AST 14 02/23/2015   NA 139 02/23/2015   K 3.9 02/23/2015    CL 105 02/23/2015   CREATININE 0.77 02/23/2015   BUN 21 02/23/2015   CO2 26 02/23/2015   TSH 2.19 09/29/2009   HGBA1C 6.6* 02/23/2015    Mm Digital Screening Bilateral  07/15/2014  CLINICAL DATA:  Screening. EXAM: DIGITAL SCREENING BILATERAL MAMMOGRAM WITH CAD COMPARISON:  Previous exam(s). ACR Breast Density Category a: The breast tissue is almost entirely fatty. FINDINGS: There are no findings suspicious for malignancy. Images were processed with CAD. IMPRESSION: No mammographic evidence of malignancy. A result letter of this screening mammogram will be mailed directly to the patient. RECOMMENDATION: Screening mammogram in one year. (Code:SM-B-01Y) BI-RADS CATEGORY  1: Negative. Electronically Signed   By: Conchita Paris M.D.   On: 07/15/2014 15:49     Assessment & Plan:  Plan I am having [name] maintain her aspirin, calcium-vitamin D, albuterol, beclomethasone, hydrochlorothiazide, meloxicam, pantoprazole, amLODipine, and ezetimibe.  Meds ordered this encounter  Medications  . DISCONTD: pantoprazole (PROTONIX) 40 MG tablet    Sig: Take 1 tablet (40 mg total) by mouth daily.    Dispense:  30 tablet    Refill:  3  . DISCONTD: meloxicam (MOBIC) 15 MG tablet    Sig: TAKE 1 TABLET BY MOUTH EVERY DAY AS NEEDED FOR PAIN    Dispense:  30 tablet    Refill:  2  . meloxicam (MOBIC) 15 MG tablet    Sig: TAKE 1 TABLET BY MOUTH EVERY DAY AS NEEDED FOR PAIN    Dispense:  30 tablet    Refill:  2  . pantoprazole (PROTONIX) 40 MG tablet    Sig: Take 1 tablet (40 mg total) by mouth daily.    Dispense:  30 tablet    Refill:  3  . amLODipine (NORVASC) 5 MG tablet    Sig: Take 1 tablet (5 mg total) by mouth daily.    Dispense:  30 tablet    Refill:  3  . ezetimibe (ZETIA) 10 MG tablet    Sig: Take 1 tablet (10 mg total) by mouth daily.    Dispense:  30 tablet    Refill:  2    Problem List Items Addressed This Visit      Unprioritized   Essential hypertension    Stable con't hctz and  norvasc      Relevant Medications   amLODipine (NORVASC) 5 MG tablet   ezetimibe (ZETIA) 10 MG tablet    Other Visit Diagnoses    Dysphagia, pharyngoesophageal phase    -  Primary    Relevant Medications    pantoprazole (PROTONIX) 40 MG tablet    DDD (degenerative disc disease), thoracolumbar        Relevant Medications    meloxicam (MOBIC) 15 MG tablet    Hyperlipidemia        Relevant Medications    amLODipine (NORVASC) 5 MG tablet    ezetimibe (ZETIA) 10 MG tablet    Other Relevant Orders    Comp Met (CMET)    Lipid panel    Secondary hypertension, unspecified  Relevant Medications    amLODipine (NORVASC) 5 MG tablet    ezetimibe (ZETIA) 10 MG tablet       Follow-up: Return if symptoms worsen or fail to improve, for march for labs--- 6 m f/u.  Garnet Koyanagi, DO

## 2015-04-08 NOTE — Progress Notes (Signed)
Pre visit review using our clinic review tool, if applicable. No additional management support is needed unless otherwise documented below in the visit note. 

## 2015-04-09 NOTE — Assessment & Plan Note (Signed)
Stable con't hctz and norvasc

## 2015-04-11 ENCOUNTER — Telehealth: Payer: Self-pay | Admitting: Family Medicine

## 2015-04-11 NOTE — Telephone Encounter (Signed)
Pt dropped off document to be filled out [employer] Patient Assistance Program Enrollment Form)

## 2015-04-12 ENCOUNTER — Telehealth: Payer: Self-pay | Admitting: *Deleted

## 2015-04-12 NOTE — Telephone Encounter (Signed)
[employer] Patient Assistance paperwork provider info completed; forwarded to provider/SLS 01/24

## 2015-04-12 NOTE — Telephone Encounter (Signed)
Error

## 2015-04-13 NOTE — Telephone Encounter (Signed)
Patient Assistance forms returned from provider with request to find out which medication patient is applying for; called and spoke with patient, she looked at her paperwork and stated that it is for the Zetia, placed this information on form; also, patient forgot to sign the Authorization to Release Medical Record information to [employer] and patient has been informed that she will need to come in to office and sign form before we can mail it to manufacturer, pt understood & agreed/SLS 01/25

## 2015-04-15 NOTE — Telephone Encounter (Signed)
Placed completed paperwork in outgoing mail to [place] Patient Assistance Program, made copy for patient and placed in mail and copy made for EMR file; sent to scan/SLS 01/27

## 2015-04-28 DIAGNOSIS — H2512 Age-related nuclear cataract, left eye: Secondary | ICD-10-CM | POA: Diagnosis not present

## 2015-05-11 ENCOUNTER — Other Ambulatory Visit: Payer: Self-pay | Admitting: Family Medicine

## 2015-05-16 NOTE — Telephone Encounter (Addendum)
Received [employer] Patient Asistance Form back in the mail with statement of missing information, missing provider signature on Section 2 [resolved]; also, patient has to answer number in household in Section 1 and complete the patient section of Attestation Form by verifying that she does have prescription drug coverage & then also attesting to what her financial hardship is due to [highlighted]/SLS 02/27 [facility] with contact name and number [for return call, if needed] RE: information and further request by [employer]/SLS 02/27

## 2015-05-17 NOTE — Telephone Encounter (Signed)
Completed paperwork placed in outgoing mail per [employer] Patient Assistance [cannot fax, must have original signatures]; will mail copy to patient/SLS 02/28

## 2015-05-29 DIAGNOSIS — H2511 Age-related nuclear cataract, right eye: Secondary | ICD-10-CM | POA: Diagnosis not present

## 2015-06-02 DIAGNOSIS — H2511 Age-related nuclear cataract, right eye: Secondary | ICD-10-CM | POA: Diagnosis not present

## 2015-06-08 ENCOUNTER — Ambulatory Visit: Payer: Medicare Other

## 2015-06-08 DIAGNOSIS — E785 Hyperlipidemia, unspecified: Secondary | ICD-10-CM

## 2015-06-08 LAB — COMPREHENSIVE METABOLIC PANEL
ALT: 9 U/L (ref 0–35)
AST: 17 U/L (ref 0–37)
Albumin: 3.9 g/dL (ref 3.5–5.2)
Alkaline Phosphatase: 64 U/L (ref 39–117)
BUN: 21 mg/dL (ref 6–23)
CHLORIDE: 104 meq/L (ref 96–112)
CO2: 30 meq/L (ref 19–32)
Calcium: 9.6 mg/dL (ref 8.4–10.5)
Creatinine, Ser: 0.77 mg/dL (ref 0.40–1.20)
GFR: 92.67 mL/min (ref >60.00)
GLUCOSE: 118 mg/dL — AB (ref 70–99)
POTASSIUM: 3.5 meq/L (ref 3.5–5.1)
SODIUM: 141 meq/L (ref 135–145)
Total Bilirubin: 0.5 mg/dL (ref 0.2–1.2)
Total Protein: 8.2 g/dL (ref 6.0–8.3)

## 2015-06-08 LAB — LIPID PANEL
CHOL/HDL RATIO: 5
CHOLESTEROL: 296 mg/dL — AB (ref 0–200)
HDL: 54.9 mg/dL (ref >39.00)
LDL CALC: 220 mg/dL — AB (ref 0–99)
NONHDL: 240.96
Triglycerides: 107 mg/dL (ref 0.0–149.0)
VLDL: 21.4 mg/dL (ref 0.0–40.0)

## 2015-06-14 ENCOUNTER — Other Ambulatory Visit: Payer: Self-pay | Admitting: Family Medicine

## 2015-06-14 DIAGNOSIS — E785 Hyperlipidemia, unspecified: Secondary | ICD-10-CM

## 2015-06-16 ENCOUNTER — Telehealth: Payer: Self-pay | Admitting: Family Medicine

## 2015-06-16 NOTE — Telephone Encounter (Signed)
Patient has been made aware and agreed to the Lipid clinic, she said she is having surgery in 3 weeks and will call to schedule after that apt. I gave her the number to call and schedule because she has missed there call.   KP

## 2015-06-16 NOTE — Telephone Encounter (Signed)
Notes Recorded by Ann Held, DO on 06/14/2015 at 10:42 PM Cholesterol is even higher.  She was never referred to lipid clinic----referral placed

## 2015-06-16 NOTE — Telephone Encounter (Signed)
Pt says that she would like to have a copy of her lab work from 06/08/14 mailed to her at her home address.   CB: [phone]

## 2015-07-05 DIAGNOSIS — Z961 Presence of intraocular lens: Secondary | ICD-10-CM | POA: Diagnosis not present

## 2015-08-24 ENCOUNTER — Other Ambulatory Visit: Payer: Self-pay | Admitting: Family Medicine

## 2015-08-24 ENCOUNTER — Other Ambulatory Visit: Payer: Self-pay

## 2015-08-24 DIAGNOSIS — Z1231 Encounter for screening mammogram for malignant neoplasm of breast: Secondary | ICD-10-CM

## 2015-09-01 ENCOUNTER — Ambulatory Visit (INDEPENDENT_AMBULATORY_CARE_PROVIDER_SITE_OTHER): Payer: Medicare Other | Admitting: Physician Assistant

## 2015-09-01 VITALS — BP 126/70 | HR 74 | Temp 97.7°F | Resp 16 | Ht 61.0 in | Wt 205.0 lb

## 2015-09-01 DIAGNOSIS — H6123 Impacted cerumen, bilateral: Secondary | ICD-10-CM

## 2015-09-01 NOTE — Patient Instructions (Signed)
     IF you received an x-ray today, you will receive an invoice from [facility]. Please contact [facility] at [phone] with questions or concerns regarding your invoice.   IF you received labwork today, you will receive an invoice from [facility]. Please contact [facility] at [phone] with questions or concerns regarding your invoice.   Our billing staff will not be able to assist you with questions regarding bills from these companies.  You will be contacted with the lab results as soon as they are available. The fastest way to get your results is to activate your My Chart account. Instructions are located on the last page of this paperwork. If you have not heard from us regarding the results in 2 weeks, please contact this office.      

## 2015-09-01 NOTE — Progress Notes (Signed)
[facility] [address], [place] [address] [phone]  Date:  09/01/2015   Name:  Felicia Martin   DOB:  May 12, 1932   MRN:  [id number]  PCP:  Ann Held, DO    Chief Complaint: Cerumen Impaction   History of Present Illness:  This is a 82 y.o. female who is presenting with clogged ears x 2 months. States at her physical with her PCP 2 months ago, it was noted that she had cerumen impaction however there was no time in that visit to take care of it. She wears hearing aids sometimes. She has decreased hearing at baseline. She denies otalgia.  Review of Systems:  Review of Systems See HPI  Patient Active Problem List   Diagnosis Date Noted  . Colon cancer screening 10/21/2014  . Dysuria 03/17/2014  . Essential hypertension 03/05/2014  . Routine general medical examination at a health care facility 12/07/2013  . Elevated blood pressure 12/07/2013  . Frequent urination at night 12/07/2013  . Nonspecific abnormal electrocardiogram (ECG) (EKG) 12/07/2013  . CANDIDIASIS, VULVAR 05/12/2010  . UTI 05/04/2010  . DEGENERATIVE JOINT DISEASE 09/22/2008  . Dysphagia 09/22/2008  . OSTEOPENIA 11/05/2007  . TELANGIECTASIA 05/29/2007  . HYPERCHOLESTEROLEMIA 02/01/2007  . PULMONARY FIBROSIS 02/01/2007  . SARCOIDOSIS 11/20/2006  . ASTHMA 11/20/2006    Prior to Admission medications   Medication Sig Start Date End Date Taking? Authorizing Provider  hydrochlorothiazide (HYDRODIURIL) 25 MG tablet Take 1 tablet (25 mg total) by mouth daily. 10/01/14  Yes Yvonne R Lowne Chase, DO  meloxicam (MOBIC) 15 MG tablet TAKE 1 TABLET BY MOUTH EVERY DAY AS NEEDED FOR PAIN 04/08/15  Yes Yvonne R Lowne Chase, DO  pantoprazole (PROTONIX) 40 MG tablet Take 1 tablet (40 mg total) by mouth daily. 04/08/15  Yes Rosalita Chessman Chase, DO    Allergies  Allergen Reactions  . Statins Other (See Comments)    Muscle aches and weakness    Past Surgical History  Procedure  Laterality Date  . Lung surgery  1960    Right lung  . Tubal ligation  1960  . Maloney dilation  12/09/14  . Colonoscopy  12/09/14  . Esophagogastroduodenoscopy  12/09/14    Social History  Substance Use Topics  . Smoking status: Former Smoker -- 0.50 packs/day for 40 years    Types: Cigarettes    Quit date: 05/25/1999  . Smokeless tobacco: Never Used  . Alcohol Use: No    Family History  Problem Relation Age of Onset  . Hypertension Sister     x's 3  . Diabetes Sister     x's 3  . Hyperlipidemia    . Liver cancer Mother   . Liver cancer Father   . Cancer Father   . Cancer Brother   . Diabetes Brother     Medication list has been reviewed and updated.  Physical Examination:  Physical Exam  Constitutional: She is oriented to person, place, and time. She appears well-developed and well-nourished. No distress.  HENT:  Head: Normocephalic and atraumatic.  Right Ear: Hearing, external ear and ear canal normal.  Left Ear: Hearing, external ear and ear canal normal.  Nose: Nose normal.  Bilateral TMs obstructed by cerumen TMs clear and symptoms resolved after ear lavage   Eyes: Conjunctivae and lids are normal. Right eye exhibits no discharge. Left eye exhibits no discharge. No scleral icterus.  Pulmonary/Chest: Effort normal. No respiratory distress.  Musculoskeletal: Normal range of motion.  Neurological: She is alert and oriented to person, place, and time.  Skin: Skin is warm, dry and intact. No lesion and no rash noted.  Psychiatric: She has a normal mood and affect. Her speech is normal and behavior is normal. Thought content normal.    BP 126/70 mmHg  Pulse 74  Temp(Src) 97.7 F (36.5 C) (Oral)  Resp 16  Ht 5\' 1"  (1.549 m)  Wt 205 lb (92.987 kg)  BMI 38.75 kg/m2  SpO2 94%  LMP 03/17/2014  Assessment and Plan:  1. Cerumen impaction, bilateral TMs clear after ear lavage, symptoms improved.   Benjaman Pott Drenda Freeze, MHS [facility]  09/01/2015

## 2015-09-12 ENCOUNTER — Ambulatory Visit
Admission: RE | Admit: 2015-09-12 | Discharge: 2015-09-12 | Disposition: A | Discharge status: Home / Self Care | Payer: Medicare Other | Source: Ambulatory Visit | Attending: Family Medicine | Admitting: Family Medicine

## 2015-09-12 DIAGNOSIS — Z1231 Encounter for screening mammogram for malignant neoplasm of breast: Secondary | ICD-10-CM

## 2015-10-07 ENCOUNTER — Ambulatory Visit (HOSPITAL_BASED_OUTPATIENT_CLINIC_OR_DEPARTMENT_OTHER)
Admission: RE | Admit: 2015-10-07 | Discharge: 2015-10-07 | Disposition: A | Discharge status: Home / Self Care | Payer: Medicare Other | Source: Ambulatory Visit | Attending: Family Medicine | Admitting: Family Medicine

## 2015-10-07 ENCOUNTER — Ambulatory Visit (INDEPENDENT_AMBULATORY_CARE_PROVIDER_SITE_OTHER): Payer: Medicare Other | Admitting: Family Medicine

## 2015-10-07 ENCOUNTER — Encounter: Payer: Self-pay | Admitting: Family Medicine

## 2015-10-07 VITALS — BP 132/78 | HR 72 | Temp 98.3°F | Ht 61.0 in | Wt 206.0 lb

## 2015-10-07 DIAGNOSIS — M5136 Other intervertebral disc degeneration, lumbar region: Secondary | ICD-10-CM | POA: Diagnosis not present

## 2015-10-07 DIAGNOSIS — M5440 Lumbago with sciatica, unspecified side: Secondary | ICD-10-CM

## 2015-10-07 DIAGNOSIS — M5137 Other intervertebral disc degeneration, lumbosacral region: Secondary | ICD-10-CM | POA: Diagnosis not present

## 2015-10-07 NOTE — Progress Notes (Signed)
Patient ID: CONSTANCIA KARCHER, female    DOB: 11-06-1932  Age: 82 y.o. MRN: [id number]    Subjective:  Subjective HPI SURA ISLAS presents for leg weakness and low back pain for years that has recently worsened.  No known injury.  Review of Systems  Constitutional: Negative for diaphoresis, appetite change, fatigue and unexpected weight change.  Eyes: Negative for pain, redness and visual disturbance.  Respiratory: Negative for cough, chest tightness, shortness of breath and wheezing.   Cardiovascular: Negative for chest pain, palpitations and leg swelling.  Endocrine: Negative for cold intolerance, heat intolerance, polydipsia, polyphagia and polyuria.  Genitourinary: Negative for dysuria, frequency and difficulty urinating.  Neurological: Negative for dizziness, light-headedness, numbness and headaches.    History Past Medical History  Diagnosis Date  . Pulmonary fibrosis ([place])   . Sarcoidosis ([place])   . Asthma   . Hypercholesterolemia   . GERD (gastroesophageal reflux disease)   . Hypertension   . Surgical history of tubal ligation 1960s    She has past surgical history that includes Lung surgery (1960); Tubal ligation (1960); Maloney dilation (12/09/14); Colonoscopy (12/09/14); and Esophagogastroduodenoscopy (12/09/14).   Her family history includes Cancer in her brother and father; Diabetes in her brother and sister; Hypertension in her sister; Liver cancer in her father and mother.She reports that she quit smoking about 16 years ago. Her smoking use included Cigarettes. She has a 20 pack-year smoking history. She has never used smokeless tobacco. She reports that she does not drink alcohol or use illicit drugs.  Current Outpatient Prescriptions on File Prior to Visit  Medication Sig Dispense Refill  . hydrochlorothiazide (HYDRODIURIL) 25 MG tablet Take 1 tablet (25 mg total) by mouth daily. 90 tablet 3  . meloxicam (MOBIC) 15 MG tablet TAKE 1 TABLET BY MOUTH EVERY DAY AS  NEEDED FOR PAIN 30 tablet 2  . pantoprazole (PROTONIX) 40 MG tablet Take 1 tablet (40 mg total) by mouth daily. 30 tablet 3   No current facility-administered medications on file prior to visit.     Objective:  Objective Physical Exam  Constitutional: She is oriented to person, place, and time. She appears well-developed and well-nourished.  HENT:  Head: Normocephalic and atraumatic.  Eyes: Conjunctivae and EOM are normal.  Neck: Normal range of motion. Neck supple. No JVD present. Carotid bruit is not present. No thyromegaly present.  Cardiovascular: Normal rate, regular rhythm and normal heart sounds.   No murmur heard. Pulmonary/Chest: Effort normal and breath sounds normal. No respiratory distress. She has no wheezes. She has no rales. She exhibits no tenderness.  Musculoskeletal: She exhibits no edema.  Neurological: She is alert and oriented to person, place, and time.  Psychiatric: She has a normal mood and affect. Her behavior is normal. Judgment and thought content normal.  Nursing note and vitals reviewed.  BP 132/78 mmHg  Pulse 72  Temp(Src) 98.3 F (36.8 C) (Oral)  Ht 5\' 1"  (1.549 m)  Wt 206 lb (93.441 kg)  BMI 38.94 kg/m2  SpO2 94%  LMP 03/17/2014 Wt Readings from Last 3 Encounters:  10/07/15 206 lb (93.441 kg)  09/01/15 205 lb (92.987 kg)  04/08/15 202 lb 9.6 oz (91.899 kg)     Lab Results  Component Value Date   WBC 4.4 12/16/2013   HGB 12.2 12/16/2013   HCT 36.7 12/16/2013   PLT 279.0 12/16/2013   GLUCOSE 118* 06/08/2015   CHOL 296* 06/08/2015   TRIG 107.0 06/08/2015   HDL 54.90 06/08/2015   LDLDIRECT 230.7  03/18/2013   LDLCALC 220* 06/08/2015   ALT 9 06/08/2015   AST 17 06/08/2015   NA 141 06/08/2015   K 3.5 06/08/2015   CL 104 06/08/2015   CREATININE 0.77 06/08/2015   BUN 21 06/08/2015   CO2 30 06/08/2015   TSH 2.19 09/29/2009   HGBA1C 6.6* 02/23/2015    Mm Screening Breast Tomo Bilateral  09/13/2015  CLINICAL DATA:  Screening. EXAM: 2D  DIGITAL SCREENING BILATERAL MAMMOGRAM WITH CAD AND ADJUNCT TOMO COMPARISON:  Previous exam(s). ACR Breast Density Category b: There are scattered areas of fibroglandular density. FINDINGS: There are no findings suspicious for malignancy. Images were processed with CAD. IMPRESSION: No mammographic evidence of malignancy. A result letter of this screening mammogram will be mailed directly to the patient. RECOMMENDATION: Screening mammogram in one year. (Code:SM-B-01Y) BI-RADS CATEGORY  1: Negative. Electronically Signed   By: Franki Cabot M.D.   On: 09/13/2015 16:14     Assessment & Plan:  Plan I am having Ms. Hush maintain her hydrochlorothiazide, meloxicam, pantoprazole, and ezetimibe.  Meds ordered this encounter  Medications  . ezetimibe (ZETIA) 10 MG tablet    Sig: Take 10 mg by mouth daily.    Problem List Items Addressed This Visit    None    Visit Diagnoses    Low back pain with sciatica, sciatica laterality unspecified, unspecified back pain laterality    -  Primary    Relevant Orders    DG Lumbar Spine Complete     if xray normal-- consider PT If worsening of xray consider MRI Pt agrees  Follow-up: Return in about 2 weeks (around 10/21/2015), or if symptoms worsen or fail to improve, for back pain.  Ann Held, DO

## 2015-10-07 NOTE — Patient Instructions (Signed)

## 2015-10-07 NOTE — Progress Notes (Signed)
Pre visit review using our clinic review tool, if applicable. No additional management support is needed unless otherwise documented below in the visit note. 

## 2015-10-10 DIAGNOSIS — H04123 Dry eye syndrome of bilateral lacrimal glands: Secondary | ICD-10-CM | POA: Diagnosis not present

## 2015-10-12 ENCOUNTER — Other Ambulatory Visit: Payer: Self-pay

## 2015-10-12 ENCOUNTER — Telehealth: Payer: Self-pay | Admitting: Family Medicine

## 2015-10-12 DIAGNOSIS — M545 Low back pain: Secondary | ICD-10-CM

## 2015-10-12 DIAGNOSIS — M5136 Other intervertebral disc degeneration, lumbar region: Secondary | ICD-10-CM

## 2015-10-12 NOTE — Telephone Encounter (Signed)
Caller name: Celesta Relation to pt: self Call back number: [phone] Pharmacy:  Reason for call: Pt came in office stating was needing labs done from her last visit  Friday 10-07-15. There was no lab orders on chart (was also verified with person at lab) so labs was not done, pt also stated received call from [place] yesterday (10-11-15) and would like to be called and informed about her x-ray results from Friday (10-07-15) and to know if lab is needed. Please advise.

## 2015-10-12 NOTE — Telephone Encounter (Signed)
Please advise in which lab you would like done.     KP

## 2015-10-13 ENCOUNTER — Other Ambulatory Visit: Payer: Self-pay | Admitting: Family Medicine

## 2015-10-13 DIAGNOSIS — I10 Essential (primary) hypertension: Secondary | ICD-10-CM

## 2015-10-13 DIAGNOSIS — R739 Hyperglycemia, unspecified: Secondary | ICD-10-CM

## 2015-10-13 DIAGNOSIS — E785 Hyperlipidemia, unspecified: Secondary | ICD-10-CM

## 2015-10-13 NOTE — Telephone Encounter (Signed)
Orders are in

## 2015-10-19 ENCOUNTER — Other Ambulatory Visit (INDEPENDENT_AMBULATORY_CARE_PROVIDER_SITE_OTHER): Payer: Medicare Other

## 2015-10-19 ENCOUNTER — Ambulatory Visit: Payer: Medicare Other

## 2015-10-19 DIAGNOSIS — E785 Hyperlipidemia, unspecified: Secondary | ICD-10-CM

## 2015-10-19 DIAGNOSIS — R739 Hyperglycemia, unspecified: Secondary | ICD-10-CM | POA: Diagnosis not present

## 2015-10-19 DIAGNOSIS — I10 Essential (primary) hypertension: Secondary | ICD-10-CM | POA: Diagnosis not present

## 2015-10-19 LAB — COMPREHENSIVE METABOLIC PANEL
ALT: 9 U/L (ref 0–35)
AST: 15 U/L (ref 0–37)
Albumin: 4 g/dL (ref 3.5–5.2)
Alkaline Phosphatase: 69 U/L (ref 39–117)
BILIRUBIN TOTAL: 0.5 mg/dL (ref 0.2–1.2)
BUN: 19 mg/dL (ref 6–23)
CO2: 24 meq/L (ref 19–32)
Calcium: 9.6 mg/dL (ref 8.4–10.5)
Chloride: 104 mEq/L (ref 96–112)
Creatinine, Ser: 0.75 mg/dL (ref 0.40–1.20)
GFR: 95.44 mL/min (ref >60.00)
GLUCOSE: 116 mg/dL — AB (ref 70–99)
Potassium: 4.1 mEq/L (ref 3.5–5.1)
SODIUM: 138 meq/L (ref 135–145)
Total Protein: 8.4 g/dL — ABNORMAL HIGH (ref 6.0–8.3)

## 2015-10-19 LAB — LIPID PANEL
CHOL/HDL RATIO: 4
Cholesterol: 273 mg/dL — ABNORMAL HIGH (ref 0–200)
HDL: 62.7 mg/dL (ref >39.00)
LDL Cholesterol: 190 mg/dL — ABNORMAL HIGH (ref 0–99)
NONHDL: 210.37
Triglycerides: 103 mg/dL (ref 0.0–149.0)
VLDL: 20.6 mg/dL (ref 0.0–40.0)

## 2015-10-19 LAB — HEMOGLOBIN A1C: HEMOGLOBIN A1C: 6.5 % (ref 4.6–6.5)

## 2015-10-20 ENCOUNTER — Ambulatory Visit: Payer: Medicare Other | Attending: Family Medicine | Admitting: Physical Therapy

## 2015-10-20 ENCOUNTER — Telehealth: Payer: Self-pay | Admitting: Family Medicine

## 2015-10-20 DIAGNOSIS — M6281 Muscle weakness (generalized): Secondary | ICD-10-CM | POA: Insufficient documentation

## 2015-10-20 DIAGNOSIS — R29898 Other symptoms and signs involving the musculoskeletal system: Secondary | ICD-10-CM | POA: Insufficient documentation

## 2015-10-20 DIAGNOSIS — R293 Abnormal posture: Secondary | ICD-10-CM | POA: Insufficient documentation

## 2015-10-20 NOTE — Telephone Encounter (Signed)
See labs 

## 2015-10-20 NOTE — Telephone Encounter (Signed)
Caller name: Berniece Relation to pt: self Call back [place]:  Reason for call: Pt came in office to inform would like to have her lab results done on 10-19-15 printed out for her to pick up on Monday if possible, since pt has appt at Physical Therapy across the hall. Please advise.

## 2015-10-20 NOTE — Telephone Encounter (Signed)
Noted, results are not complete at this time. Please advise   KP
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
## 2015-10-24 ENCOUNTER — Ambulatory Visit: Payer: Medicare Other | Admitting: Physical Therapy

## 2015-10-24 DIAGNOSIS — R293 Abnormal posture: Secondary | ICD-10-CM | POA: Diagnosis not present

## 2015-10-24 DIAGNOSIS — R29898 Other symptoms and signs involving the musculoskeletal system: Secondary | ICD-10-CM

## 2015-10-24 DIAGNOSIS — M6281 Muscle weakness (generalized): Secondary | ICD-10-CM

## 2015-10-24 NOTE — Patient Instructions (Signed)
Knee to Chest (Flexion)    Pull knee toward chest. Feel stretch in lower back or buttock area. Breathing deeply, Hold __20-30__ seconds. Repeat with other knee. Repeat __3__ times. Do _2-3___ sessions per day.  http://gt2.exer.us/226   Copyright  VHI. All rights reserved.   Lower Trunk Rotation Stretch    Keeping back flat and feet together, rotate knees to left side. Hold __30__ seconds.  Repeat to right side Repeat __3__ times per set. Do _1___ sets per session. Do __2-3__ sessions per day.  http://orth.exer.us/123   Copyright  VHI. All rights reserved.   Bridging    Slowly raise buttocks from floor, keeping stomach tight.  Hold for 5 seconds. Repeat _10___ times per set. Do __1__ sets per session. Do __2-3__ sessions per day.  http://orth.exer.us/1097   Copyright  VHI. All rights reserved.   Strengthening: Hip Abductor - Resisted    With band looped around both legs above knees, push thighs apart. Repeat __10__ times per set. Do _1___ sets per session. Do __2-3__ sessions per day.  http://orth.exer.us/688   Copyright  VHI. All rights reserved.    Functional Quadriceps: Sit to Stand    Sit on edge of chair, feet flat on floor. Stand upright, extending knees fully. Repeat __10__ times per set. Do _1___ sets per session. Do __2-3__ sessions per day.  http://orth.exer.us/735   Copyright  VHI. All rights reserved.

## 2015-10-24 NOTE — Therapy (Signed)
[facility] [place] [address]  [address] [place], [place], [address] Phone: [phone]   Fax:  [phone]  Physical Therapy Treatment  Patient Details  Name: Felicia Martin MRN: [id number] Date of Birth: 04/16/1932 Referring Provider: Yvonne Lowne  Encounter Date: 10/24/2015      PT End of Session - 10/24/15 1112    Visit Number 2   Number of Visits 12   Date for PT Re-Evaluation 12/01/15   PT Start Time 1032   PT Stop Time 1110   PT Time Calculation (min) 38 min   Activity Tolerance Patient tolerated treatment well   Behavior During Therapy [facility] for tasks assessed/performed      Past Medical History:  Diagnosis Date  . Asthma   . GERD (gastroesophageal reflux disease)   . Hypercholesterolemia   . Hypertension   . Pulmonary fibrosis ([facility])   . Sarcoidosis ([facility])   . Surgical history of tubal ligation 1960s    Past Surgical History:  Procedure Laterality Date  . COLONOSCOPY  12/09/14  . ESOPHAGOGASTRODUODENOSCOPY  12/09/14  . LUNG SURGERY  1960   Right lung  . Maloney dilation  12/09/14  . TUBAL LIGATION  1960    There were no vitals filed for this visit.      Subjective Assessment - 10/24/15 1035    Subjective back felt good the other day but today is kinda stiff.  had her family reunion yesterday so she was busy.   Pertinent History COPD (pt reports), asthma, HTN, HLD, pulmonary fibrosis   Limitations Walking;Standing;House hold activities   Patient Stated Goals "get this tiredness out of my back"   Currently in Pain? No/denies                         [facility] Adult PT Treatment/Exercise - 10/24/15 1036      Ambulation/Gait   Gait Comments amb 350' for endurance      Exercises   Exercises Knee/Hip;Lumbar     Lumbar Exercises: Stretches   Single Knee to Chest Stretch 3 reps;30 seconds   Single Knee to Chest Stretch Limitations bil   Lower Trunk Rotation 3 reps;30 seconds   Lower Trunk  Rotation Limitations bil     Lumbar Exercises: Supine   Clam Limitations x10 with blue theraband   Bridge 10 reps;5 seconds     Knee/Hip Exercises: Aerobic   Nustep L7 x 8 min                PT Education - 10/24/15 1111    Education provided Yes   Education Details HEP   Person(s) Educated Patient   Methods Explanation;Demonstration;Handout   Comprehension Verbalized understanding             PT Long Term Goals - 10/24/15 1112      PT LONG TERM GOAL #1   Title independent with HEP (12/01/15)   Status On-going     PT LONG TERM GOAL #2   Title improve 6MWT to > 850' and complete without rest break for improved endurance and mobility (12/01/15)   Status On-going     PT LONG TERM GOAL #3   Title report ability to amb for at least 10 min without symptoms for improved mobility and in preparation for return to gym (12/01/15)   Status On-going               Plan - 10/24/15 1112      Clinical Impression Statement Pt tolerated exercises well; no goals met as only 2nd visit.  Will continue to benefit from PT to maximize function.  Decreasing frequency to 1x/wk x 6 weeks due to high co pay per pt request.   Rehab Potential Good   PT Frequency 1x / week   PT Duration 6 weeks   PT Treatment/Interventions ADLs/Self Care Home Management;Cryotherapy;Electrical Stimulation;Moist Heat;Traction;Ultrasound;Neuromuscular re-education;Therapeutic exercise;Therapeutic activities;Functional mobility training;Gait training;Stair training;Patient/family education;Manual techniques;Taping;Energy conservation   PT Next Visit Plan review HEP, hip/core strengthening   Consulted and Agree with Plan of Care Patient      Patient will benefit from skilled therapeutic intervention in order to improve the following deficits and impairments:  Abnormal gait, Decreased endurance, Cardiopulmonary status limiting activity, Decreased strength, Decreased mobility  Visit Diagnosis: Muscle  weakness (generalized)  Other symptoms and signs involving the musculoskeletal system  Abnormal posture     Problem List Patient Active Problem List   Diagnosis Date Noted  . Colon cancer screening 10/21/2014  . Dysuria 03/17/2014  . Essential hypertension 03/05/2014  . Routine general medical examination at a health care facility 12/07/2013  . Elevated blood pressure 12/07/2013  . Frequent urination at night 12/07/2013  . Nonspecific abnormal electrocardiogram (ECG) (EKG) 12/07/2013  . CANDIDIASIS, VULVAR 05/12/2010  . UTI 05/04/2010  . DEGENERATIVE JOINT DISEASE 09/22/2008  . Dysphagia 09/22/2008  . OSTEOPENIA 11/05/2007  . TELANGIECTASIA 05/29/2007  . HYPERCHOLESTEROLEMIA 02/01/2007  . PULMONARY FIBROSIS 02/01/2007  . SARCOIDOSIS 11/20/2006  . ASTHMA 11/20/2006       Stephanie F Matthews, PT, DPT 10/24/15 11:15 AM    [name] [place] [address]  [address] [place], [place], [address] Phone: [phone]   Fax:  [phone]  Name: Felicia Martin MRN: [id number] Date of Birth: 01/13/1933    

## 2015-10-31 ENCOUNTER — Telehealth: Payer: Self-pay | Admitting: Family Medicine

## 2015-10-31 ENCOUNTER — Ambulatory Visit: Payer: Medicare Other | Admitting: Physical Therapy

## 2015-10-31 ENCOUNTER — Telehealth: Payer: Self-pay | Admitting: Behavioral Health

## 2015-10-31 DIAGNOSIS — R293 Abnormal posture: Secondary | ICD-10-CM

## 2015-10-31 DIAGNOSIS — M6281 Muscle weakness (generalized): Secondary | ICD-10-CM | POA: Diagnosis not present

## 2015-10-31 DIAGNOSIS — R29898 Other symptoms and signs involving the musculoskeletal system: Secondary | ICD-10-CM

## 2015-10-31 MED ORDER — FENOFIBRATE 160 MG PO TABS: 160.0000 mg | ORAL_TABLET | Freq: Every day | ORAL | 2 refills | Status: DC

## 2015-10-31 NOTE — Telephone Encounter (Signed)
Caller name: [place] Relation to pt: self Call back number: [phone] Pharmacy:  Reason for call: PT came in office stating has not received her last labs results, (pt was mentioned that there was a message left on her home phone from info on her chart) pt stated would like to have a print out of her last lab results and will pick up after her visit across the hall (physical therapy) today. Please advise.

## 2015-10-31 NOTE — Telephone Encounter (Addendum)
-----   Message from Ann Held, DO sent at 10/20/2015  9:18 PM EDT ----- Cholesterol--- LDL goal < 100,  HDL >40,  TG < 150.  Diet and exercise will increase HDL and decrease LDL and TG.  Fish,  Fish Oil, Flaxseed oil will also help increase the HDL and decrease Triglycerides.   Recheck labs in 3 months----- con't zetia--- try fenofibrate 160 mg 1 po qd, #30  2 refills Lipid, cmp.   Rx sent to the patient's preferred pharmacy.

## 2015-10-31 NOTE — Therapy (Signed)
[facility] [place] [address]  [place], [place], [address] Phone: [phone]   Fax:  [phone]  Physical Therapy Treatment  Patient Details  Name: Felicia Martin MRN: [id number] Date of Birth: 1932-10-23 Referring Provider: Garnet Koyanagi  Encounter Date: 10/31/2015      PT End of Session - 10/31/15 1433    Visit Number 3   Number of Visits 12   Date for PT Re-Evaluation 12/01/15   PT Start Time [id number]   PT Stop Time 1432   PT Time Calculation (min) 40 min   Activity Tolerance Patient tolerated treatment well   Behavior During Therapy [facility] for tasks assessed/performed      Past Medical History:  Diagnosis Date  . Asthma   . GERD (gastroesophageal reflux disease)   . Hypercholesterolemia   . Hypertension   . Pulmonary fibrosis ([place])   . Sarcoidosis ([place])   . Surgical history of tubal ligation 1960s    Past Surgical History:  Procedure Laterality Date  . COLONOSCOPY  12/09/14  . ESOPHAGOGASTRODUODENOSCOPY  12/09/14  . LUNG SURGERY  1960   Right lung  . Maloney dilation  12/09/14  . TUBAL LIGATION  1960    There were no vitals filed for this visit.      Subjective Assessment - 10/31/15 1355    Subjective feeling pretty good today; only a little stiff today.  feels she can walk a little better.   Patient Stated Goals "get this tiredness out of my back"   Currently in Pain? No/denies                         [facility] Adult PT Treatment/Exercise - 10/31/15 1356      Lumbar Exercises: Stretches   Single Knee to Chest Stretch 3 reps;30 seconds   Single Knee to Chest Stretch Limitations bil   Lower Trunk Rotation 3 reps;30 seconds   Lower Trunk Rotation Limitations bil     Lumbar Exercises: Seated   Sit to Stand 10 reps     Lumbar Exercises: Supine   Clam Limitations x10 with blue theraband   Bridge 10 reps;5 seconds     Knee/Hip Exercises: Aerobic   Nustep L7 x 8 min     Knee/Hip  Exercises: Standing   Heel Raises Both;15 reps   Heel Raises Limitations bil UE support; toe raises x 15   Hip Flexion 15 reps;Both;Knee bent   Hip Flexion Limitations 3#; bil UE support   Hip Abduction Both;15 reps;Knee straight   Abduction Limitations 3#; bil UE support   Hip Extension 15 reps;Both;Knee bent   Extension Limitations 3#; bil UE support                     PT Long Term Goals - 10/24/15 1112      PT LONG TERM GOAL #1   Title independent with HEP (12/01/15)   Status On-going     PT LONG TERM GOAL #2   Title improve 6MWT to > 850' and complete without rest break for improved endurance and mobility (12/01/15)   Status On-going     PT LONG TERM GOAL #3   Title report ability to amb for at least 10 min without symptoms for improved mobility and in preparation for return to gym (12/01/15)   Status On-going               Plan - 10/31/15 1434  Clinical Impression Statement Pt feels like she is doing better and reports improved ease of walking.  Progressing well towards goals.  Still fatigues quickly needed occasional seated rest breaks.  Will continue to benefit from PT to maximize function.   PT Treatment/Interventions ADLs/Self Care Home Management;Cryotherapy;Electrical Stimulation;Moist Heat;Traction;Ultrasound;Neuromuscular re-education;Therapeutic exercise;Therapeutic activities;Functional mobility training;Gait training;Stair training;Patient/family education;Manual techniques;Taping;Energy conservation   PT Next Visit Plan review HEP, hip/core strengthening   Consulted and Agree with Plan of Care Patient      Patient will benefit from skilled therapeutic intervention in order to improve the following deficits and impairments:  Abnormal gait, Decreased endurance, Cardiopulmonary status limiting activity, Decreased strength, Decreased mobility  Visit Diagnosis: Muscle weakness (generalized)  Other symptoms and signs involving the  musculoskeletal system  Abnormal posture     Problem List Patient Active Problem List   Diagnosis Date Noted  . Colon cancer screening 10/21/2014  . Dysuria 03/17/2014  . Essential hypertension 03/05/2014  . Routine general medical examination at a health care facility 12/07/2013  . Elevated blood pressure 12/07/2013  . Frequent urination at night 12/07/2013  . Nonspecific abnormal electrocardiogram (ECG) (EKG) 12/07/2013  . CANDIDIASIS, VULVAR 05/12/2010  . UTI 05/04/2010  . DEGENERATIVE JOINT DISEASE 09/22/2008  . Dysphagia 09/22/2008  . OSTEOPENIA 11/05/2007  . TELANGIECTASIA 05/29/2007  . HYPERCHOLESTEROLEMIA 02/01/2007  . PULMONARY FIBROSIS 02/01/2007  . SARCOIDOSIS 11/20/2006  . ASTHMA 11/20/2006       Laureen Abrahams, PT, DPT 10/31/15 2:36 PM    [facility] [place] [address]  [place], [place], [address] Phone: [phone]   Fax:  [phone]  Name: Felicia Martin MRN: [id number] Date of Birth: November 19, 1932

## 2015-10-31 NOTE — Telephone Encounter (Signed)
Informed patient of her lab results and the provider's recommendations. Also, provided patient with a print out of labs. She verbalized understanding and did not have any further questions or concerns before leaving the office.

## 2015-11-08 ENCOUNTER — Ambulatory Visit: Payer: Medicare Other | Admitting: Physical Therapy

## 2015-11-08 DIAGNOSIS — R293 Abnormal posture: Secondary | ICD-10-CM | POA: Diagnosis not present

## 2015-11-08 DIAGNOSIS — M6281 Muscle weakness (generalized): Secondary | ICD-10-CM | POA: Diagnosis not present

## 2015-11-08 DIAGNOSIS — R29898 Other symptoms and signs involving the musculoskeletal system: Secondary | ICD-10-CM

## 2015-11-08 NOTE — Therapy (Signed)
[facility] [address]  [place], [place], [address] Phone: [phone]   Fax:  [phone]  Physical Therapy Treatment  Patient Details  Name: Felicia Martin MRN: [id number] Date of Birth: 02/10/1933 Referring Provider: Garnet Koyanagi  Encounter Date: 11/08/2015      PT End of Session - 11/08/15 1448    Visit Number 4   Number of Visits 12   Date for PT Re-Evaluation 12/01/15   PT Start Time 1400   PT Stop Time 1445   PT Time Calculation (min) 45 min   Activity Tolerance Patient tolerated treatment well   Behavior During Therapy [facility] for tasks assessed/performed      Past Medical History:  Diagnosis Date  . Asthma   . GERD (gastroesophageal reflux disease)   . Hypercholesterolemia   . Hypertension   . Pulmonary fibrosis ([place])   . Sarcoidosis ([place])   . Surgical history of tubal ligation 1960s    Past Surgical History:  Procedure Laterality Date  . COLONOSCOPY  12/09/14  . ESOPHAGOGASTRODUODENOSCOPY  12/09/14  . LUNG SURGERY  1960   Right lung  . Maloney dilation  12/09/14  . TUBAL LIGATION  1960    There were no vitals filed for this visit.      Subjective Assessment - 11/08/15 1403    Subjective humidity is affecting COPD; back feels good   Pertinent History COPD (pt reports), asthma, HTN, HLD, pulmonary fibrosis   Limitations Walking;Standing;House hold activities   How long can you stand comfortably? "maybe 2 minutes, I don't know"   How long can you walk comfortably? at work uses cart can walk 2-3 min   Patient Stated Goals "get this tiredness out of my back"   Currently in Pain? No/denies                         [facility] Adult PT Treatment/Exercise - 11/08/15 1423      Ambulation/Gait   Gait Comments 6:30 for total of 636' without stopping     Knee/Hip Exercises: Aerobic   Nustep L7 x 8 min     Knee/Hip Exercises: Standing   Heel Raises Both;20 reps   Heel Raises  Limitations bil UE support; toe raises x 15   Hip Flexion 20 reps;Both;Knee bent   Hip Abduction Both;20 reps;Knee straight   Hip Extension Both;20 reps;Knee straight                     PT Long Term Goals - 10/24/15 1112      PT LONG TERM GOAL #1   Title independent with HEP (12/01/15)   Status On-going     PT LONG TERM GOAL #2   Title improve 6MWT to > 850' and complete without rest break for improved endurance and mobility (12/01/15)   Status On-going     PT LONG TERM GOAL #3   Title report ability to amb for at least 10 min without symptoms for improved mobility and in preparation for return to gym (12/01/15)   Status On-going               Plan - 11/08/15 1529    Clinical Impression Statement Pt progressing well and tolerated amb 6 min without rest breaks despite pt reporting she can only amb for a few min.    PT Treatment/Interventions ADLs/Self Care Home Management;Cryotherapy;Electrical Stimulation;Moist Heat;Traction;Ultrasound;Neuromuscular re-education;Therapeutic exercise;Therapeutic activities;Functional mobility training;Gait training;Stair training;Patient/family education;Manual  techniques;Taping;Energy conservation   PT Next Visit Plan review HEP, hip/core strengthening   Consulted and Agree with Plan of Care Patient      Patient will benefit from skilled therapeutic intervention in order to improve the following deficits and impairments:  Abnormal gait, Decreased endurance, Cardiopulmonary status limiting activity, Decreased strength, Decreased mobility  Visit Diagnosis: Muscle weakness (generalized)  Other symptoms and signs involving the musculoskeletal system  Abnormal posture     Problem List Patient Active Problem List   Diagnosis Date Noted  . Colon cancer screening 10/21/2014  . Dysuria 03/17/2014  . Essential hypertension 03/05/2014  . Routine general medical examination at a health care facility 12/07/2013  . Elevated blood  pressure 12/07/2013  . Frequent urination at night 12/07/2013  . Nonspecific abnormal electrocardiogram (ECG) (EKG) 12/07/2013  . CANDIDIASIS, VULVAR 05/12/2010  . UTI 05/04/2010  . DEGENERATIVE JOINT DISEASE 09/22/2008  . Dysphagia 09/22/2008  . OSTEOPENIA 11/05/2007  . TELANGIECTASIA 05/29/2007  . HYPERCHOLESTEROLEMIA 02/01/2007  . PULMONARY FIBROSIS 02/01/2007  . SARCOIDOSIS 11/20/2006  . ASTHMA 11/20/2006      Laureen Abrahams, PT, DPT 11/08/15 3:31 PM    [facility] [address]  [place], [place], [address] Phone: [phone]   Fax:  [phone]  Name: Felicia Martin MRN: [id number] Date of Birth: 1932/09/05

## 2015-11-08 NOTE — Patient Instructions (Signed)
Toe Up    Gently rise up on toes, then roll back on heels. Repeat __20__ times. Do __2-3__ sessions per day.  http://gt2.exer.us/475   Copyright  VHI. All rights reserved.   Standing Marching   Using a chair if necessary, march in place. Repeat _20___ times. Do __2-3__ sessions per day.  http://gt2.exer.us/344   Copyright  VHI. All rights reserved.   Hip Backward Kick   Using a chair for balance, keep legs shoulder width apart and toes pointed for- ward. Slowly extend one leg back, keeping knee straight. Do not lean forward. Repeat with other leg. Repeat _20___ times. Do __2-3__ sessions per day.  http://gt2.exer.us/340   Copyright  VHI. All rights reserved.   Hip Side Kick   Holding a chair for balance, keep legs shoulder width apart and toes pointed forward. Swing a leg out to side, keeping knee straight. Do not lean. Repeat using other leg. Repeat __20__ times. Do _2-3___ sessions per day.  http://gt2.exer.us/342   Copyright  VHI. All rights reserved.

## 2015-11-10 ENCOUNTER — Telehealth: Payer: Self-pay | Admitting: Family Medicine

## 2015-11-10 DIAGNOSIS — J45909 Unspecified asthma, uncomplicated: Secondary | ICD-10-CM

## 2015-11-10 MED ORDER — ALBUTEROL SULFATE HFA 108 (90 BASE) MCG/ACT IN AERS: 2.0000 | INHALATION_SPRAY | Freq: Four times a day (QID) | RESPIRATORY_TRACT | 1 refills | Status: DC | PRN

## 2015-11-10 NOTE — Telephone Encounter (Signed)
Relation to [id number] Call back number:[phone] Pharmacy:  [facility] [place], [place] [phone] (Phone) [phone] (Fax)     Reason for call:  Patient requesting "pro air inhaler"

## 2015-11-10 NOTE — Telephone Encounter (Signed)
Rx faxed.    KP 

## 2015-11-14 ENCOUNTER — Other Ambulatory Visit: Payer: Self-pay | Admitting: Family Medicine

## 2015-11-14 ENCOUNTER — Ambulatory Visit: Payer: Medicare Other

## 2015-11-14 DIAGNOSIS — I159 Secondary hypertension, unspecified: Secondary | ICD-10-CM

## 2015-11-14 NOTE — Telephone Encounter (Signed)
Medication for Norvasc is not on pt med list  Please advise---PC

## 2015-11-17 ENCOUNTER — Ambulatory Visit (INDEPENDENT_AMBULATORY_CARE_PROVIDER_SITE_OTHER): Payer: Medicare Other | Admitting: Physician Assistant

## 2015-11-17 VITALS — BP 124/80 | HR 90 | Temp 98.1°F | Resp 18 | Ht 61.0 in | Wt 205.4 lb

## 2015-11-17 DIAGNOSIS — R21 Rash and other nonspecific skin eruption: Secondary | ICD-10-CM | POA: Diagnosis not present

## 2015-11-17 MED ORDER — FLUCONAZOLE 150 MG PO TABS: 150.0000 mg | ORAL_TABLET | Freq: Once | ORAL | 0 refills | Status: AC

## 2015-11-17 MED ORDER — ZINC OXIDE 11.3 % EX CREA
1.0000 | TOPICAL_CREAM | Freq: Two times a day (BID) | CUTANEOUS | 0 refills | Status: DC
Start: 2015-11-17 — End: 2016-02-23

## 2015-11-17 MED ORDER — NYSTATIN 100000 UNIT/GM EX CREA: 1.0000 "application " | TOPICAL_CREAM | Freq: Two times a day (BID) | CUTANEOUS | 0 refills | Status: DC

## 2015-11-17 NOTE — Progress Notes (Signed)
Felicia Martin  MRN: [id number] DOB: Feb 11, 1933  Subjective:  Felicia Martin is a 82 y.o. female seen in office today for a chief complaint of vaginal rash x 1 day. Has associated vaginal itching and burning. Denies vaginal discharge. Pt has a history of vaginal candidiasis. Her last episode was 2016. States that it gets worse when her doctor increases her ezetimibe and her doctor just recently increased it 3 weeks ago.    Review of Systems  Constitutional: Negative for chills and fever.  Genitourinary: Negative for dysuria, flank pain, frequency, hematuria and vaginal bleeding.    Patient Active Problem List   Diagnosis Date Noted  . Colon cancer screening 10/21/2014  . Dysuria 03/17/2014  . Essential hypertension 03/05/2014  . Routine general medical examination at a health care facility 12/07/2013  . Elevated blood pressure 12/07/2013  . Frequent urination at night 12/07/2013  . Nonspecific abnormal electrocardiogram (ECG) (EKG) 12/07/2013  . CANDIDIASIS, VULVAR 05/12/2010  . UTI 05/04/2010  . DEGENERATIVE JOINT DISEASE 09/22/2008  . Dysphagia 09/22/2008  . OSTEOPENIA 11/05/2007  . TELANGIECTASIA 05/29/2007  . HYPERCHOLESTEROLEMIA 02/01/2007  . PULMONARY FIBROSIS 02/01/2007  . SARCOIDOSIS 11/20/2006  . ASTHMA 11/20/2006    Current Outpatient Prescriptions on File Prior to Visit  Medication Sig Dispense Refill  . albuterol (PROVENTIL HFA;VENTOLIN HFA) 108 (90 Base) MCG/ACT inhaler Inhale 2 puffs into the lungs every 6 (six) hours as needed. 1 Inhaler 1  . fenofibrate 160 MG tablet Take 1 tablet (160 mg total) by mouth daily. 30 tablet 2  . hydrochlorothiazide (HYDRODIURIL) 25 MG tablet Take 1 tablet (25 mg total) by mouth daily. 90 tablet 3  . meloxicam (MOBIC) 15 MG tablet TAKE 1 TABLET BY MOUTH EVERY DAY AS NEEDED FOR PAIN 30 tablet 2  . pantoprazole (PROTONIX) 40 MG tablet Take 1 tablet (40 mg total) by mouth daily. 30 tablet 3  . ezetimibe (ZETIA) 10 MG tablet  Take 10 mg by mouth daily.     No current facility-administered medications on file prior to visit.     Allergies  Allergen Reactions  . Statins Other (See Comments)    Muscle aches and weakness    Objective:  BP 124/80   Pulse 90   Temp 98.1 F (36.7 C) (Oral)   Resp 18   Ht 5\' 1"  (1.549 m)   Wt 205 lb 6.4 oz (93.2 kg)   LMP 03/17/2014   SpO2 95%   BMI 38.81 kg/m   Physical Exam  Constitutional: She is oriented to person, place, and time and well-developed, well-nourished, and in no distress.  HENT:  Head: Normocephalic and atraumatic.  Eyes: Conjunctivae are normal.  Neck: Normal range of motion.  Pulmonary/Chest: Effort normal.  Genitourinary:  Genitourinary Comments: Erythemtous macular rash with minimal maceration noted on inferior aspect of labia majora extending into the intergluteal cleft to perianal region. Satellite lesions noted. No discharge noted.   Neurological: She is alert and oriented to person, place, and time. Gait normal.  Skin: Skin is warm and dry.  Psychiatric: Affect normal.  Vitals reviewed.   Assessment and Plan :   1. Rash and nonspecific skin eruption -Consistent with candidal intertrigo  - nystatin cream (MYCOSTATIN); Apply 1 application topically 2 (two) times daily. To affected area up to four days after symptoms resolve.  Dispense: 30 g; Refill: 0 - fluconazole (DIFLUCAN) 150 MG tablet; Take 1 tablet (150 mg total) by mouth once.  Dispense: 1 tablet; Refill: 0 - zinc oxide (BALMEX)  11.3 % CREA cream; Apply 1 application topically 2 (two) times daily. After nystatin cream has been applied. As needed for itching/discomfort.  Dispense: 113 g; Refill: 0 -Return to clinic if symptoms worsen, do not improve, or as needed   Tenna Delaine PA-C  [facility] 11/17/2015 12:30 PM

## 2015-11-17 NOTE — Patient Instructions (Addendum)
-  Return to clinic if symptoms worsen, do not improve, or as needed   Intertrigo Intertrigo is a skin irritation (inflammation) that happens in warm, moist areas of the body. It happens mostly between folds of skin or where skin rubs together. HOME CARE  Keep the affected area cool and dry.  Leave the skin folds open to air.  Put cotton or linen between the folds of skin.  Avoid tight clothing.  Wear open-toed shoes or sandals.  Use powder on the affected area as told by your doctor.  Only use medicated creams or pastes as told by your doctor. GET HELP RIGHT AWAY IF:  The rash does not get better after 1 week of treatment.  The rash gets worse.  You have a fever or chills. MAKE SURE YOU:  Understand these instructions.  Will watch your condition.  Will get help right away if you are not doing well or get worse.   This information is not intended to replace advice given to you by your health care provider. Make sure you discuss any questions you have with your health care provider.   Document Released: 04/07/2010 Document Revised: 05/28/2011 Document Reviewed: 09/06/2014 Elsevier Interactive Patient Education 2016 [employer].     IF you received an x-ray today, you will receive an invoice from [facility]. Please contact [facility] at [phone] with questions or concerns regarding your invoice.   IF you received labwork today, you will receive an invoice from [employer]. Please contact [facility] at [phone] with questions or concerns regarding your invoice.   Our billing staff will not be able to assist you with questions regarding bills from these companies.  You will be contacted with the lab results as soon as they are available. The fastest way to get your results is to activate your My Chart account. Instructions are located on the last page of this paperwork. If you have not heard from [place] regarding the results  in 2 weeks, please contact this office.

## 2015-11-22 ENCOUNTER — Ambulatory Visit: Payer: Medicare Other | Attending: Family Medicine | Admitting: Physical Therapy

## 2015-11-22 DIAGNOSIS — M6281 Muscle weakness (generalized): Secondary | ICD-10-CM | POA: Diagnosis not present

## 2015-11-22 DIAGNOSIS — R293 Abnormal posture: Secondary | ICD-10-CM | POA: Insufficient documentation

## 2015-11-22 DIAGNOSIS — R29898 Other symptoms and signs involving the musculoskeletal system: Secondary | ICD-10-CM | POA: Insufficient documentation

## 2015-11-22 NOTE — Therapy (Signed)
[facility] [address]  [place], [place], [address] Phone: [phone]   Fax:  [phone]  Physical Therapy Treatment  Patient Details  Name: Felicia Martin MRN: [id number] Date of Birth: 12-Oct-1932 Referring Provider: Garnet Koyanagi  Encounter Date: 11/22/2015      PT End of Session - 11/22/15 1523    Visit Number 5   PT Start Time 9628   PT Stop Time 1523   PT Time Calculation (min) 38 min   Activity Tolerance Patient tolerated treatment well   Behavior During Therapy [facility] for tasks assessed/performed      Past Medical History:  Diagnosis Date  . Asthma   . GERD (gastroesophageal reflux disease)   . Hypercholesterolemia   . Hypertension   . Pulmonary fibrosis ([place])   . Sarcoidosis ([place])   . Surgical history of tubal ligation 1960s    Past Surgical History:  Procedure Laterality Date  . COLONOSCOPY  12/09/14  . ESOPHAGOGASTRODUODENOSCOPY  12/09/14  . LUNG SURGERY  1960   Right lung  . Maloney dilation  12/09/14  . TUBAL LIGATION  1960    There were no vitals filed for this visit.      Subjective Assessment - 11/22/15 1452    Subjective doing well today; had a rough week but doing well.   Pertinent History COPD (pt reports), asthma, HTN, HLD, pulmonary fibrosis   Patient Stated Goals "get this tiredness out of my back"   Currently in Pain? No/denies            [facility] PT Assessment - 11/22/15 1507      6 Minute walk- Post Test   6 Minute Walk Post Test yes   HR (bpm) 92   02 Sat (%RA) 91 %   Modified Borg Scale for Dyspnea 3- Moderate shortness of breath or breathing difficulty   Perceived Rate of Exertion (Borg) 11- Fairly light     6 minute walk test results    Aerobic Endurance Distance Walked 757   Endurance additional comments 2 seated rest breaks needed                     [facility] Adult PT Treatment/Exercise - 11/22/15 1453      Lumbar Exercises: Stretches   Single Knee to Chest Stretch 1 rep;30 seconds   Single Knee to Chest Stretch Limitations bil   Lower Trunk Rotation 1 rep;30 seconds   Lower Trunk Rotation Limitations bil     Lumbar Exercises: Seated   Sit to Stand 5 reps     Lumbar Exercises: Supine   Clam Limitations x5 with blue theraband   Bridge 5 reps;5 seconds     Knee/Hip Exercises: Aerobic   Nustep L6 x 5 min     Knee/Hip Exercises: Standing   Heel Raises Right;10 reps   Heel Raises Limitations bil UE support; toe raises x 10   Hip Flexion 10 reps;Both;Knee bent   Hip Abduction Both;10 reps;Knee straight   Hip Extension Both;10 reps;Knee straight                     PT Long Term Goals - 11/22/15 1523      PT LONG TERM GOAL #1   Title independent with HEP (12/01/15)   Status Achieved     PT LONG TERM GOAL #2   Title improve 6MWT to > 850' and complete without rest break for improved endurance and mobility (  12/01/15)   Status Not Met     PT LONG TERM GOAL #3   Title report ability to amb for at least 10 min without symptoms for improved mobility and in preparation for return to gym (12/01/15)   Baseline pt amb 6 min in clinic with 2 seated rest breaks, feel pt could stand and amb for longer but increased SOB today limited   Status Partially Met               Plan - 17-Dec-2015 1528    Clinical Impression Statement Pt has maximized rehab potential and is ready for d/c.  Recommended pt return to gym but at this time is hesistant due to weather.   PT Treatment/Interventions ADLs/Self Care Home Management;Cryotherapy;Electrical Stimulation;Moist Heat;Traction;Ultrasound;Neuromuscular re-education;Therapeutic exercise;Therapeutic activities;Functional mobility training;Gait training;Stair training;Patient/family education;Manual techniques;Taping;Energy conservation   PT Next Visit Plan d/c PT   Consulted and Agree with Plan of Care Patient      Patient will benefit from skilled therapeutic  intervention in order to improve the following deficits and impairments:  Abnormal gait, Decreased endurance, Cardiopulmonary status limiting activity, Decreased strength, Decreased mobility  Visit Diagnosis: Muscle weakness (generalized)  Other symptoms and signs involving the musculoskeletal system  Abnormal posture       G-Codes - 2015-12-17 1529    Functional Assessment Tool Used FOTO   Functional Limitation Mobility: Walking and moving around   Mobility: Walking and Moving Around Goal Status [phone]) At least 40 percent but less than 60 percent impaired, limited or restricted   Mobility: Walking and Moving Around Discharge Status [phone]) At least 40 percent but less than 60 percent impaired, limited or restricted      Problem List Patient Active Problem List   Diagnosis Date Noted  . Colon cancer screening 10/21/2014  . Dysuria 03/17/2014  . Essential hypertension 03/05/2014  . Routine general medical examination at a health care facility 12/07/2013  . Elevated blood pressure 12/07/2013  . Frequent urination at night 12/07/2013  . Nonspecific abnormal electrocardiogram (ECG) (EKG) 12/07/2013  . CANDIDIASIS, VULVAR 05/12/2010  . UTI 05/04/2010  . DEGENERATIVE JOINT DISEASE 09/22/2008  . Dysphagia 09/22/2008  . OSTEOPENIA 11/05/2007  . TELANGIECTASIA 05/29/2007  . HYPERCHOLESTEROLEMIA 02/01/2007  . PULMONARY FIBROSIS 02/01/2007  . SARCOIDOSIS 11/20/2006  . ASTHMA 11/20/2006       Laureen Abrahams, PT, DPT 2015/12/17 3:36 PM    [facility] [address]  [place], [place], [address] Phone: [phone]   Fax:  [phone]  Name: Felicia Martin MRN: [id number] Date of Birth: 01/28/1933      PHYSICAL THERAPY DISCHARGE SUMMARY  Visits from Start of Care: 5  Current functional level related to goals / functional outcomes: See above   Remaining deficits: N/a, has occasional SOB due to  COPD affecting mobility   Education / Equipment: HEP  Plan: Patient agrees to discharge.  Patient goals were partially met. Patient is being discharged due to being pleased with the current functional level.  ?????     Laureen Abrahams, PT, DPT 2015/12/17 3:37 PM  [facility] [place], [place] [address]  [phone] (office) [phone] (fax)

## 2015-12-01 ENCOUNTER — Ambulatory Visit (INDEPENDENT_AMBULATORY_CARE_PROVIDER_SITE_OTHER): Payer: Medicare Other | Admitting: Family Medicine

## 2015-12-01 ENCOUNTER — Encounter: Payer: Self-pay | Admitting: Family Medicine

## 2015-12-01 VITALS — BP 142/80 | HR 73 | Temp 99.0°F | Resp 18 | Ht 61.0 in | Wt 208.4 lb

## 2015-12-01 DIAGNOSIS — J4531 Mild persistent asthma with (acute) exacerbation: Secondary | ICD-10-CM | POA: Diagnosis not present

## 2015-12-01 DIAGNOSIS — B354 Tinea corporis: Secondary | ICD-10-CM | POA: Diagnosis not present

## 2015-12-01 MED ORDER — NYSTATIN 100000 UNIT/GM EX POWD: Freq: Four times a day (QID) | CUTANEOUS | 0 refills | Status: DC

## 2015-12-01 MED ORDER — BECLOMETHASONE DIPROPIONATE 40 MCG/ACT IN AERS: 2.0000 | INHALATION_SPRAY | Freq: Two times a day (BID) | RESPIRATORY_TRACT | Status: DC

## 2015-12-01 NOTE — Progress Notes (Signed)
Pre visit review using our clinic review tool, if applicable. No additional management support is needed unless otherwise documented below in the visit note. 

## 2015-12-01 NOTE — Patient Instructions (Signed)
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
## 2015-12-02 ENCOUNTER — Telehealth: Payer: Self-pay | Admitting: Family Medicine

## 2015-12-02 DIAGNOSIS — J45909 Unspecified asthma, uncomplicated: Secondary | ICD-10-CM

## 2015-12-02 NOTE — Telephone Encounter (Signed)
Caller name: Relationship to patient: Self Can be reached: [phone] Pharmacy: [facility] [place]   Reason for call: Patient said that when she went topick up her Rx yesterday the inhaler had not been called in. Plse adv

## 2015-12-02 NOTE — Telephone Encounter (Addendum)
The Rx that was sent last month had a refill on it, she said the pharmacy tech told her that but she was not understanding her, she said she will call the pharmacy to get the refill.     KP

## 2016-02-02 ENCOUNTER — Other Ambulatory Visit: Payer: Self-pay | Admitting: Family Medicine

## 2016-02-06 ENCOUNTER — Other Ambulatory Visit: Payer: Self-pay | Admitting: Family Medicine

## 2016-02-06 DIAGNOSIS — I159 Secondary hypertension, unspecified: Secondary | ICD-10-CM

## 2016-02-06 NOTE — Telephone Encounter (Signed)
Please advise. Amlodipine is not on current med list. Called pt and she reports she is still taking it and has not stopped taking at any point. She does not know why it was removed from med list.

## 2016-02-23 ENCOUNTER — Ambulatory Visit (INDEPENDENT_AMBULATORY_CARE_PROVIDER_SITE_OTHER): Payer: Medicare Other | Admitting: *Deleted

## 2016-02-23 ENCOUNTER — Encounter: Payer: Self-pay | Admitting: *Deleted

## 2016-02-23 VITALS — BP 144/92 | HR 89 | Resp 20 | Ht 60.0 in | Wt 209.8 lb

## 2016-02-23 DIAGNOSIS — J45909 Unspecified asthma, uncomplicated: Secondary | ICD-10-CM

## 2016-02-23 DIAGNOSIS — Z0000 Encounter for general adult medical examination without abnormal findings: Secondary | ICD-10-CM | POA: Diagnosis not present

## 2016-02-23 DIAGNOSIS — E7800 Pure hypercholesterolemia, unspecified: Secondary | ICD-10-CM | POA: Diagnosis not present

## 2016-02-23 MED ORDER — BECLOMETHASONE DIPROPIONATE 40 MCG/ACT IN AERS: 2.0000 | INHALATION_SPRAY | Freq: Two times a day (BID) | RESPIRATORY_TRACT | 5 refills | Status: DC

## 2016-02-23 NOTE — Progress Notes (Signed)
Pre visit review using our clinic review tool, if applicable. No additional management support is needed unless otherwise documented below in the visit note. 

## 2016-02-23 NOTE — Assessment & Plan Note (Signed)
Pt reports no recent flares. Rarely using rescue inhaler. QVAR ordered at last OV, but it was not sent to pharmacy. Medication filled to pharmacy, but pt states she does not think she can afford it. Pt to call insurance for formulary alternatives and let us know.

## 2016-02-23 NOTE — Patient Instructions (Addendum)
You look great!!! Restart your Zetia for cholesterol. You can also start OTC fish oil or flaxseed oil.  Consider OTC calcium + vitamin D supplement.  Bring a copy of your advance directives to your next office visit. Follow-up w/ Dr. Carollee Herter as scheduled.   Preventing Osteoporosis, Adult Osteoporosis is a condition that causes the bones to get weaker. With osteoporosis, the bones become thinner, and the normal spaces in bone tissue become larger. This can make the bones weak and cause them to break more easily. People who have osteoporosis are more likely to break their wrist, spine, or hip. Even a minor accident or injury can be enough to break weak bones. Osteoporosis can occur with aging. Your body constantly replaces old bone tissue with new tissue. As you get older, you may lose bone tissue more quickly, or it may be replaced more slowly. Osteoporosis is more likely to develop if you have poor nutrition or do not get enough calcium or vitamin D. Other lifestyle factors can also play a role. By making some diet and lifestyle changes, you can help to keep your bones healthy and help to prevent osteoporosis. What nutrition changes can be made? Nutrition plays an important role in maintaining healthy, strong bones.  Make sure you get enough calcium every day from food or from calcium supplements.  If you are age 30 or younger, aim to get 1,000 mg of calcium every day.  If you are older than age 16, aim to get 1,200 mg of calcium every day.  Try to get enough vitamin D every day.  If you are age 15 or younger, aim to get 600 international units (IU) every day.  If you are older than age 71, aim to get 800 international units every day.  Follow a healthy diet. Eat plenty of foods that contain calcium and vitamin D.  Calcium is in milk, cheese, yogurt, and other dairy products. Some fish and vegetables are also good sources of calcium. Many foods such as cereals and breads have had  calcium added to them (are fortified). Check nutrition labels to see how much calcium is in a food or drink.  Foods that contain vitamin D include milk, cereals, salmon, and tuna. Your body also makes vitamin D when you are out in the sun. Bare skin exposure to the sun on your face, arms, legs, or back for no more than 30 minutes a day, 2 times per week is more than enough. Beyond that, it is important to use sunblock to protect your skin from sunburn, which increases your risk for skin cancer. What lifestyle changes can be made? Making changes in your everyday life can also play an important role in preventing osteoporosis.  Stay active and get exercise every day. Ask your health care provider what types of exercise are best for you.  Do not use any products that contain nicotine or tobacco, such as cigarettes and e-cigarettes. If you need help quitting, ask your health care provider.  Limit alcohol intake to no more than 1 drink a day for nonpregnant women and 2 drinks a day for men. One drink equals 12 oz of beer, 5 oz of wine, or 1 oz of hard liquor. Why are these changes important? Making these nutrition and lifestyle changes can:  Help you develop and maintain healthy, strong bones.  Prevent loss of bone mass and the problems that are caused by that loss, such as broken bones and delayed healing.  Make you feel better  mentally and physically. What can happen if changes are not made? Problems that can result from osteoporosis can be very serious. These may include:  A higher risk of broken bones that are painful and do not heal well.  Physical malformations, such as a collapsed spine or a hunched back.  Problems with movement. Where to find support: If you need help making changes to prevent osteoporosis, talk with your health care provider. You can ask for a referral to a diet and nutrition specialist (dietitian) and a physical therapist. Where to find more information: Learn  more about osteoporosis from:  NIH Osteoporosis and Related [place]: www.niams.GolfingGoddess.com.br  U.S. Office on Women's Health: SouvenirBaseball.es.html  National Osteoporosis Foundation: ProfilePeek.ch Summary  Osteoporosis is a condition that causes weak bones that are more likely to break.  Eating a healthy diet and making sure you get enough calcium and vitamin D can help prevent osteoporosis.  Other ways to reduce your risk of osteoporosis include getting regular exercise and avoiding alcohol and products that contain nicotine or tobacco. This information is not intended to replace advice given to you by your health care provider. Make sure you discuss any questions you have with your health care provider. Document Released: 03/20/2015 Document Revised: 11/14/2015 Document Reviewed: 11/14/2015 Elsevier Interactive Patient Education  2017 [employer].

## 2016-02-23 NOTE — Assessment & Plan Note (Signed)
Chronic problem, intolerant to statins. Pt reports rash developed when she started taking Fenofibrate, so she stopped taking that and Zetia. Instructed pt to restart Zetia since she reports no reaction to this. Due labs per last result note-appt scheduled. She was previously referred to lipid clinic, but was not able to make appt. Defer to PCP regarding re-referring to lipid clinic.

## 2016-02-23 NOTE — Progress Notes (Signed)
Subjective:   Felicia Martin is a 82 y.o. female who presents for Medicare Annual (Subsequent) preventive examination.  Review of Systems:  No ROS.  Medicare Wellness Visit.  Cardiac Risk Factors include: advanced age (>63men, >23 women);dyslipidemia;hypertension;sedentary lifestyle;obesity (BMI >30kg/m2)  Sleep patterns: Naps 1-5 pm after work, then eats dinner. Goes to bed at 7:30-4:30. Feels rested on waking. Gets up once nightly to void.    Home Safety/Smoke Alarms: Feels safe in home. Smoke alarms in place.   Living environment; residence and Firearm Safety: Lives alone in 1 story home w/ no stairs. Daughter lives 3 doors down and grandson lives around the corner. No firearms. Seat Belt Safety/Bike Helmet: Wears seat belt.   Counseling:   Eye Exam- Follows w/ eye doctor in [place] yearly, cannot remember name of eye doctor.  Female:   Pap- N/A due to age      37- last 09/12/15. BI-RADS CATEGORY  1: Negative.     Dexa scan- last 10/12/09, osteopenia     CCS- last 12/09/14 w/ Dr. Erskine Emery. Colonic polyposis, repeat in 3 years pending medical status.     Objective:     Vitals: BP (!) 144/92 (BP Location: Left Arm, Patient Position: Sitting, Cuff Size: Large)   Pulse 89   Resp 20   Ht 5' (1.524 m)   Wt 209 lb 12.8 oz (95.2 kg)   LMP 03/17/2014   SpO2 95%   BMI 40.97 kg/m   Body mass index is 40.97 kg/m.   Tobacco History  Smoking Status  . Former Smoker  . Packs/day: 0.50  . Years: 40.00  . Types: Cigarettes  . Quit date: 05/25/1999  Smokeless Tobacco  . Never Used     Counseling given: Not Answered   Past Medical History:  Diagnosis Date  . Asthma   . GERD (gastroesophageal reflux disease)   . Hypercholesterolemia   . Hypertension   . Pulmonary fibrosis ([place])   . Sarcoidosis ([place])   . Surgical history of tubal ligation 1960s   Past Surgical History:  Procedure Laterality Date  . COLONOSCOPY  12/09/14  . ESOPHAGOGASTRODUODENOSCOPY   12/09/14  . LUNG SURGERY  1960   Right lung  . Maloney dilation  12/09/14  . TUBAL LIGATION  1960   Family History  Problem Relation Age of Onset  . Diabetes Sister     x's 3  . Liver cancer Mother   . Liver cancer Father   . Cancer Father   . Cancer Brother   . Diabetes Brother   . Hypertension Sister     x's 3  . Hyperlipidemia     History  Sexual Activity  . Sexual activity: No    Outpatient Encounter Prescriptions as of 02/23/2016  Medication Sig  . albuterol (PROVENTIL HFA;VENTOLIN HFA) 108 (90 Base) MCG/ACT inhaler Inhale 2 puffs into the lungs every 6 (six) hours as needed.  Marland Kitchen amLODipine (NORVASC) 5 MG tablet TAKE 1 TABLET(5 MG) BY MOUTH DAILY  . hydrochlorothiazide (HYDRODIURIL) 25 MG tablet Take 1 tablet (25 mg total) by mouth daily.  . meloxicam (MOBIC) 15 MG tablet TAKE 1 TABLET BY MOUTH EVERY DAY AS NEEDED FOR PAIN  . pantoprazole (PROTONIX) 40 MG tablet Take 1 tablet (40 mg total) by mouth daily. (Patient taking differently: Take 40 mg by mouth daily as needed. )  . beclomethasone (QVAR) 40 MCG/ACT inhaler Inhale 2 puffs into the lungs 2 (two) times daily.  Marland Kitchen ezetimibe (ZETIA) 10 MG tablet Take 10  mg by mouth daily.  . [DISCONTINUED] fenofibrate 160 MG tablet Take 1 tablet (160 mg total) by mouth daily. (Patient not taking: Reported on 02/23/2016)  . [DISCONTINUED] nystatin (NYSTATIN) powder Apply topically 4 (four) times daily. (Patient not taking: Reported on 02/23/2016)  . [DISCONTINUED] nystatin cream (MYCOSTATIN) Apply 1 application topically 2 (two) times daily. To affected area up to four days after symptoms resolve. (Patient not taking: Reported on 02/23/2016)  . [DISCONTINUED] zinc oxide (BALMEX) 11.3 % CREA cream Apply 1 application topically 2 (two) times daily. After nystatin cream has been applied. As needed for itching/discomfort. (Patient not taking: Reported on 02/23/2016)  . [DISCONTINUED] beclomethasone (QVAR) 40 MCG/ACT inhaler 2 puff    No  facility-administered encounter medications on file as of 02/23/2016.     Activities of Daily Living In your present state of health, do you have any difficulty performing the following activities: 02/23/2016 02/23/2015  Hearing? [name]  Vision? N Y  Difficulty concentrating or making decisions? N N  Walking or climbing stairs? Y Y  Dressing or bathing? N N  Doing errands, shopping? N N  Preparing Food and eating ? N N  Using the Toilet? N N  In the past six months, have you accidently leaked urine? N N  Do you have problems with loss of bowel control? N N  Managing your Medications? N N  Managing your Finances? N N  Housekeeping or managing your Housekeeping? N N  Some recent data might be hidden    Patient Care Team: Ann Held, DO as PCP - General (Family Medicine) Inda Castle, MD as Consulting Physician (Gastroenterology)    Assessment:    Physical assessment deferred to PCP.  Hearing/Vision: Hearing Screening Comments: Able to hear conversational tones w/o difficulty. Wears bilateral hearing aids, but is not wearing them today. Fails whisper test. Vision Screening Comments: Had cataract surgery in Feb/March. Wearing glasses today. Follows w/ Dr. Katy Fitch for surgery and another eye doctor in [place] for routine eye exams.   Exercise Activities and Dietary recommendations Current Exercise Habits: Home exercise routine, Type of exercise: calisthenics (PT exercises for back. Walks working at [employer] Time (Minutes): 20, Frequency (Times/Week): 4, Weekly Exercise (Minutes/Week): 80, Exercise limited by: respiratory conditions(s)  Diet (meal preparation, eat out, water intake, caffeinated beverages, dairy products, fruits and vegetables): well balanced, on average, 3 meals per day. Drinks lots of water throughout the day. No coffee, no soda in general, but does rarely drink Mellow Yellow/Sprite. Usually eats out, stating that is cheaper for her than preparing own  meals.  Breakfast: Yogurt w/ peaches Lunch: Sandwich Dinner: Vegetable, starch, and meat      Goals    . Continue to improve diet.   (pt-stated)    . Lower cholesterol (pt-stated)      Fall Risk Fall Risk  02/23/2016 11/17/2015 09/01/2015 02/23/2015 12/07/2013  Falls in the past year? No No No No No  Risk for fall due to : - - - History of fall(s) -  Risk for fall due to (comments): - - - Fell 2 years ago climbing up on something; no injury.   -   Depression Screen PHQ 2/9 Scores 02/23/2016 11/17/2015 09/01/2015 02/23/2015  PHQ - 2 Score 0 0 0 0     Cognitive Function MMSE - Mini Mental State Exam 02/23/2016 02/23/2015  Orientation to time 5 5  Orientation to Place 5 5  Registration 3 3  Attention/ Calculation 5 5  Recall 2 3  Language- name 2 objects 2 2  Language- repeat 1 1  Language- follow 3 step command 3 3  Language- read & follow direction 1 1  Write a sentence 1 1  Copy design 1 1  Total score 29 30        Immunization History  Administered Date(s) Administered  . Td 03/19/2002   Screening Tests Health Maintenance  Topic Date Due  . INFLUENZA VACCINE  04/07/2016 (Originally 10/18/2015)  . PNA vac Low Risk Adult (1 of 2 - PCV13) 11/30/2016 (Originally 01/23/2000)  . TETANUS/TDAP  03/18/2022 (Originally 03/19/2012)  . MAMMOGRAM  09/11/2016  . COLONOSCOPY  12/08/2017  . DEXA SCAN  Completed  . ZOSTAVAX  Addressed      Plan:   Follow-up with Dr. Carollee Herter as scheduled. Bring a copy of your advance directives to your next office visit. Start OTC Flaxseed oil, calcium and Vit D supplement. Declines DEXA, stating that she is not interested in purusing medical therapies for osteopenia/osteoporosis. Declines flu and pneumococcal vaccines.  Fasting labs next week.   During the course of the visit the patient was educated and counseled about the following appropriate screening and preventive services:   Vaccines to include Pneumoccal, Influenza, Hepatitis B, Td,  Zostavax, HCV  Cardiovascular Disease  Colorectal cancer screening  Bone density screening  Diabetes screening  Glaucoma screening  Mammography/PAP  Nutrition counseling   Patient Instructions (the written plan) was given to the patient.   Dorrene German, RN  02/23/2016

## 2016-02-24 ENCOUNTER — Other Ambulatory Visit: Payer: Self-pay | Admitting: *Deleted

## 2016-02-24 DIAGNOSIS — I10 Essential (primary) hypertension: Secondary | ICD-10-CM

## 2016-02-24 DIAGNOSIS — E785 Hyperlipidemia, unspecified: Secondary | ICD-10-CM

## 2016-02-24 DIAGNOSIS — R350 Frequency of micturition: Secondary | ICD-10-CM

## 2016-02-29 ENCOUNTER — Other Ambulatory Visit (INDEPENDENT_AMBULATORY_CARE_PROVIDER_SITE_OTHER): Payer: Medicare Other

## 2016-02-29 DIAGNOSIS — E785 Hyperlipidemia, unspecified: Secondary | ICD-10-CM

## 2016-02-29 DIAGNOSIS — I10 Essential (primary) hypertension: Secondary | ICD-10-CM

## 2016-02-29 LAB — LIPID PANEL
CHOL/HDL RATIO: 5
Cholesterol: 307 mg/dL — ABNORMAL HIGH (ref 0–200)
HDL: 63.2 mg/dL (ref >39.00)
LDL Cholesterol: 220 mg/dL — ABNORMAL HIGH (ref 0–99)
NONHDL: 243.57
TRIGLYCERIDES: 119 mg/dL (ref 0.0–149.0)
VLDL: 23.8 mg/dL (ref 0.0–40.0)

## 2016-02-29 LAB — COMPREHENSIVE METABOLIC PANEL
ALT: 10 U/L (ref 0–35)
AST: 17 U/L (ref 0–37)
Albumin: 4.2 g/dL (ref 3.5–5.2)
Alkaline Phosphatase: 77 U/L (ref 39–117)
BILIRUBIN TOTAL: 0.5 mg/dL (ref 0.2–1.2)
BUN: 23 mg/dL (ref 6–23)
CO2: 26 meq/L (ref 19–32)
CREATININE: 0.88 mg/dL (ref 0.40–1.20)
Calcium: 10.2 mg/dL (ref 8.4–10.5)
Chloride: 103 mEq/L (ref 96–112)
GFR: 79.29 mL/min (ref >60.00)
GLUCOSE: 130 mg/dL — AB (ref 70–99)
Potassium: 3.9 mEq/L (ref 3.5–5.1)
Sodium: 138 mEq/L (ref 135–145)
TOTAL PROTEIN: 8.6 g/dL — AB (ref 6.0–8.3)

## 2016-02-29 LAB — POC URINALSYSI DIPSTICK (AUTOMATED)
BILIRUBIN UA: NEGATIVE
GLUCOSE UA: NEGATIVE
KETONES UA: NEGATIVE
Leukocytes, UA: NEGATIVE
Nitrite, UA: NEGATIVE
Protein, UA: NEGATIVE
RBC UA: NEGATIVE
SPEC GRAV UA: 1.02
Urobilinogen, UA: NEGATIVE
pH, UA: 6

## 2016-03-02 ENCOUNTER — Telehealth: Payer: Self-pay | Admitting: Family Medicine

## 2016-03-02 ENCOUNTER — Other Ambulatory Visit: Payer: Self-pay | Admitting: Family Medicine

## 2016-03-02 DIAGNOSIS — E7800 Pure hypercholesterolemia, unspecified: Secondary | ICD-10-CM

## 2016-03-02 DIAGNOSIS — E785 Hyperlipidemia, unspecified: Secondary | ICD-10-CM

## 2016-03-02 NOTE — Telephone Encounter (Signed)
Patient called requesting that her most recent lab results, blood and urine, be sent to her home address. Address on file is correct. Please advise

## 2016-03-02 NOTE — Telephone Encounter (Signed)
Printed and sent letter with results to pt, per pt's request. LB

## 2016-05-18 ENCOUNTER — Encounter: Payer: Self-pay | Admitting: Cardiovascular Disease

## 2016-05-18 ENCOUNTER — Ambulatory Visit (INDEPENDENT_AMBULATORY_CARE_PROVIDER_SITE_OTHER): Payer: Medicare Other | Admitting: Cardiovascular Disease

## 2016-05-18 VITALS — BP 150/74 | HR 82 | Ht 60.0 in | Wt 209.0 lb

## 2016-05-18 DIAGNOSIS — Z713 Dietary counseling and surveillance: Secondary | ICD-10-CM | POA: Diagnosis not present

## 2016-05-18 DIAGNOSIS — E785 Hyperlipidemia, unspecified: Secondary | ICD-10-CM

## 2016-05-18 MED ORDER — ROSUVASTATIN CALCIUM 5 MG PO TABS: ORAL_TABLET | ORAL | 3 refills | Status: DC

## 2016-05-18 NOTE — Progress Notes (Signed)
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 She was referred to me by Dr. Etter Sjogren for cardiovascular evaluation because of hyperlipidemia intolerant to statin therapy. Her only other risk factors hypertension. She is otherwise totally asymptomatic. She didn't try to multiple statins side effects. Her most recent lipid profile performed 02/29/16 revealed total cholesterol 307, LDL of 220 and HDL of 63.   Current Outpatient Prescriptions  Medication Sig Dispense Refill  . albuterol (PROVENTIL HFA;VENTOLIN HFA) 108 (90 Base) MCG/ACT inhaler Inhale 2 puffs into the lungs every 6 (six) hours as needed. 1 Inhaler 1  . amLODipine (NORVASC) 5 MG tablet TAKE 1 TABLET(5 MG) BY MOUTH DAILY 90 tablet 0  . beclomethasone (QVAR) 40 MCG/ACT inhaler Inhale 2 puffs into the lungs 2 (two) times daily. 1 Inhaler 5  . ezetimibe (ZETIA) 10 MG tablet Take 10 mg by mouth daily.    . hydrochlorothiazide (HYDRODIURIL) 25 MG tablet Take 1 tablet (25 mg total) by mouth daily. 90 tablet 3  . meloxicam (MOBIC) 15 MG tablet TAKE 1 TABLET BY MOUTH EVERY DAY AS NEEDED FOR PAIN 30 tablet 2  . pantoprazole (PROTONIX) 40 MG tablet Take 1 tablet (40 mg total) by mouth daily. (Patient taking differently: Take 40 mg by mouth daily as needed. ) 30 tablet 3   No current facility-administered medications for this visit.     Allergies  Allergen Reactions  . Statins Other (See Comments)    Muscle aches and weakness  . Fenofibrate Rash    Social History   Social History  . Marital status: Single    Spouse name: N/A  . Number of children: 4  . Years of education: N/A   Occupational History  .  Chartered certified accountant    Social History Main Topics  . Smoking status: Former Smoker    Packs/day: 0.50    Years: 40.00    Types: Cigarettes    Quit date: 05/25/1999  . Smokeless tobacco: Never Used  . Alcohol use No  . Drug use: No  . Sexual activity: No   Other Topics Concern  . Not on file   Social History Narrative  . No narrative on file     Review of Systems: General: negative for chills, fever, night sweats or weight changes.  Cardiovascular: negative for chest pain, dyspnea on exertion, edema, orthopnea, palpitations, paroxysmal nocturnal dyspnea or shortness of breath Dermatological: negative for rash Respiratory: negative for cough or wheezing Urologic: negative for hematuria Abdominal: negative for nausea, vomiting, diarrhea, bright red blood per rectum, melena, or hematemesis Neurologic: negative for visual changes, syncope, or dizziness All other systems reviewed and are otherwise negative except as noted above.    Blood pressure (!) 150/74, pulse 82, height 5' (1.524 m), weight 209 lb (94.8 kg), last menstrual period 03/17/2014.  General appearance: alert and no distress Neck: no adenopathy, no carotid bruit, no JVD, supple, symmetrical, trachea midline and thyroid not enlarged, symmetric, no tenderness/mass/nodules Lungs: clear to auscultation bilaterally Heart: regular rate and rhythm, S1, S2 normal, no murmur, click, rub or gallop Extremities: extremities normal, atraumatic, no cyanosis or edema  EKG sinus rhythm at 82 with nonspecific ST and T-wave changes. I personally reviewed this  EKG  ASSESSMENT AND PLAN:   HYPERCHOLESTEROLEMIA Ms. Kolodziejski was referred to me by Dr. Etter Sjogren for evaluation of hyperlipidemia intolerant to statin therapy. She is on Zetia with recent lipid profile performed 02/29/16 revealed a total cholesterol 307, LDL 220 and HDL of 63. I am going to  have a our pharmacist, Cyril Mourning, discussed with her the possibility of PC SK9 monoclonal injectables. I  am also going to refer her to [facility] nutrition for dietary counseling.  Essential hypertension History of essential hypertension on hydrochlorothiazide and amlodipine blood pressure today 144/92. Continue current meds at current dosing      Lorretta Harp MD [facility], [facility] 05/18/2016 11:39 AM

## 2016-05-18 NOTE — Assessment & Plan Note (Signed)
Ms. Felicia Martin was referred to me by Dr. Etter Sjogren for evaluation of hyperlipidemia intolerant to statin therapy. She is on Zetia with recent lipid profile performed 02/29/16 revealed a total cholesterol 307, LDL 220 and HDL of 63. I am going to  have a our pharmacist, Cyril Mourning, discussed with her the possibility of PC SK9 monoclonal injectables. I am also going to refer her to [facility] nutrition for dietary counseling.

## 2016-05-18 NOTE — Patient Instructions (Signed)
Medication Instructions: Your physician recommends that you continue on your current medications as directed. Please refer to the Current Medication list given to you today.  Follow-Up: You have been referred to Nutrition and Dietary Counseling.  Your physician recommends that you schedule a follow-up appointment as needed with Dr. Gwenlyn Found.  If you need a refill on your cardiac medications before your next appointment, please call your pharmacy.

## 2016-05-18 NOTE — Assessment & Plan Note (Signed)
History of essential hypertension on hydrochlorothiazide and amlodipine blood pressure today 144/92. Continue current meds at current dosing

## 2016-05-22 NOTE — Addendum Note (Signed)
Addended by: Zebedee Iba on: 05/22/2016 10:36 AM   Modules accepted: Orders

## 2016-05-31 ENCOUNTER — Ambulatory Visit (INDEPENDENT_AMBULATORY_CARE_PROVIDER_SITE_OTHER): Payer: Medicare Other | Admitting: Family Medicine

## 2016-05-31 ENCOUNTER — Encounter: Payer: Self-pay | Admitting: Family Medicine

## 2016-05-31 VITALS — BP 126/68 | HR 86 | Temp 98.0°F | Resp 16 | Ht 60.0 in | Wt 208.2 lb

## 2016-05-31 DIAGNOSIS — I159 Secondary hypertension, unspecified: Secondary | ICD-10-CM | POA: Diagnosis not present

## 2016-05-31 DIAGNOSIS — R9431 Abnormal electrocardiogram [ECG] [EKG]: Secondary | ICD-10-CM

## 2016-05-31 DIAGNOSIS — R062 Wheezing: Secondary | ICD-10-CM | POA: Diagnosis not present

## 2016-05-31 DIAGNOSIS — I10 Essential (primary) hypertension: Secondary | ICD-10-CM

## 2016-05-31 DIAGNOSIS — E785 Hyperlipidemia, unspecified: Secondary | ICD-10-CM

## 2016-05-31 DIAGNOSIS — R300 Dysuria: Secondary | ICD-10-CM | POA: Diagnosis not present

## 2016-05-31 DIAGNOSIS — R1314 Dysphagia, pharyngoesophageal phase: Secondary | ICD-10-CM

## 2016-05-31 LAB — POC URINALSYSI DIPSTICK (AUTOMATED)
Bilirubin, UA: NEGATIVE
Blood, UA: NEGATIVE
Glucose, UA: NEGATIVE
Ketones, UA: NEGATIVE
NITRITE UA: NEGATIVE
PROTEIN UA: NEGATIVE
Spec Grav, UA: 1.025
UROBILINOGEN UA: NEGATIVE
pH, UA: 6

## 2016-05-31 MED ORDER — ALBUTEROL SULFATE (2.5 MG/3ML) 0.083% IN NEBU
2.5000 mg | INHALATION_SOLUTION | Freq: Once | RESPIRATORY_TRACT | Status: AC
  Administered 2016-05-31: 2.5 mg via RESPIRATORY_TRACT

## 2016-05-31 NOTE — Patient Instructions (Signed)

## 2016-05-31 NOTE — Progress Notes (Signed)
Patient ID: Felicia Martin, female   DOB: Aug 23, 1932, 83 y.o.   MRN: [id number]     Subjective:  I acted as a Education administrator for Dr. Carollee Herter.  Guerry Bruin, [place]   Patient ID: Felicia Martin, female    DOB: [birth year], 83 y.o.   MRN: [id number]  Chief Complaint  Patient presents with  . Hypertension  . Hyperlipidemia  . Wheezing    HPI  Patient is in today for follow up blood pressure and cholesterol.  She was started on Crestor by Dr. Gwenlyn Found about 3 weeks ago.  She has no complaints yet.  Blood pressure at Dr. Gwenlyn Found office was high 150/74 on 05/18/16.  Patient was wheezing walking back to room.  She states that she usually having wheezing when she has to walk a long ways.  She has had some dysuria that started yesterday.  Today she feels okay but would like her urine dipped because of urinary frequency and dysuria.  No other complaints.       Patient Care Team: Ann Held, DO as PCP - General (Family Medicine) Inda Castle, MD as Consulting Physician (Gastroenterology)   Past Medical History:  Diagnosis Date  . Asthma   . GERD (gastroesophageal reflux disease)   . Hypercholesterolemia   . Hypertension   . Pulmonary fibrosis ([place])   . Sarcoidosis ([place])   . Surgical history of tubal ligation 1960s    Past Surgical History:  Procedure Laterality Date  . COLONOSCOPY  12/09/14  . ESOPHAGOGASTRODUODENOSCOPY  12/09/14  . LUNG SURGERY  1960   Right lung  . Maloney dilation  12/09/14  . TUBAL LIGATION  1960    Family History  Problem Relation Age of Onset  . Diabetes Sister     x's 3  . Liver cancer Mother   . Liver cancer Father   . Cancer Father   . Cancer Brother   . Diabetes Brother   . Hypertension Sister     x's 3  . Hyperlipidemia      Social History   Social History  . Marital status: Single    Spouse name: N/A  . Number of children: 4  . Years of education: N/A   Occupational History  . Chartered certified accountant    Social History Main Topics  . Smoking  status: Former Smoker    Packs/day: 0.50    Years: 40.00    Types: Cigarettes    Quit date: 05/25/1999  . Smokeless tobacco: Never Used  . Alcohol use No  . Drug use: No  . Sexual activity: No   Other Topics Concern  . Not on file   Social History Narrative  . No narrative on file    Outpatient Medications Prior to Visit  Medication Sig Dispense Refill  . albuterol (PROVENTIL HFA;VENTOLIN HFA) 108 (90 Base) MCG/ACT inhaler Inhale 2 puffs into the lungs every 6 (six) hours as needed. 1 Inhaler 1  . meloxicam (MOBIC) 15 MG tablet TAKE 1 TABLET BY MOUTH EVERY DAY AS NEEDED FOR PAIN 30 tablet 2  . amLODipine (NORVASC) 5 MG tablet TAKE 1 TABLET(5 MG) BY MOUTH DAILY 90 tablet 0  . beclomethasone (QVAR) 40 MCG/ACT inhaler Inhale 2 puffs into the lungs 2 (two) times daily. 1 Inhaler 5  . ezetimibe (ZETIA) 10 MG tablet Take 10 mg by mouth daily.    . hydrochlorothiazide (HYDRODIURIL) 25 MG tablet Take 1 tablet (25 mg total) by mouth daily. 90 tablet 3  . pantoprazole (PROTONIX)  40 MG tablet Take 1 tablet (40 mg total) by mouth daily. (Patient taking differently: Take 40 mg by mouth daily as needed. ) 30 tablet 3  . rosuvastatin (CRESTOR) 5 MG tablet Take 1 tablet by mouth up to 3 times weekly as tolerated 12 tablet 3   No facility-administered medications prior to visit.     Allergies  Allergen Reactions  . Statins Other (See Comments)    Muscle aches and weakness  . Fenofibrate Rash    Review of Systems  Constitutional: Negative for fever and malaise/fatigue.  HENT: Negative for congestion.   Eyes: Negative for blurred vision.  Respiratory: Positive for wheezing. Negative for cough and shortness of breath.   Cardiovascular: Negative for chest pain, palpitations and leg swelling.  Gastrointestinal: Negative for vomiting.  Musculoskeletal: Negative for back pain.  Skin: Negative for rash.  Neurological: Negative for loss of consciousness and headaches.       Objective:      Physical Exam  Constitutional: She is oriented to person, place, and time. She appears well-developed and well-nourished. No distress.  HENT:  Head: Normocephalic and atraumatic.  Eyes: Conjunctivae are normal.  Neck: Normal range of motion. No thyromegaly present.  Cardiovascular: Normal rate and regular rhythm.   Pulmonary/Chest: Effort normal. She has wheezes. She has no rales.  Abdominal: Soft. Bowel sounds are normal. There is no tenderness.  Musculoskeletal: Normal range of motion. She exhibits no edema or deformity.  Neurological: She is alert and oriented to person, place, and time.  Skin: Skin is warm and dry. She is not diaphoretic.  Psychiatric: She has a normal mood and affect.    BP 126/68 (BP Location: Left Arm, Cuff Size: Normal)   Pulse 86   Temp 98 F (36.7 C) (Oral)   Resp 16   Ht 5' (1.524 m)   Wt 208 lb 3.2 oz (94.4 kg)   LMP 03/17/2014   SpO2 94%   BMI 40.66 kg/m  Wt Readings from Last 3 Encounters:  05/31/16 208 lb 3.2 oz (94.4 kg)  05/18/16 209 lb (94.8 kg)  02/23/16 209 lb 12.8 oz (95.2 kg)      Immunization History  Administered Date(s) Administered  . Td 03/19/2002    Health Maintenance  Topic Date Due  . PNA vac Low Risk Adult (1 of 2 - PCV13) 11/30/2016 (Originally 01/23/2000)  . TETANUS/TDAP  03/18/2022 (Originally 03/19/2012)  . INFLUENZA VACCINE  06/17/2026 (Originally 10/18/2015)  . MAMMOGRAM  09/11/2016  . COLONOSCOPY  12/08/2017  . DEXA SCAN  Completed    Lab Results  Component Value Date   WBC 4.4 12/16/2013   HGB 12.2 12/16/2013   HCT 36.7 12/16/2013   PLT 279.0 12/16/2013   GLUCOSE 130 (H) 02/29/2016   CHOL 307 (H) 02/29/2016   TRIG 119.0 02/29/2016   HDL 63.20 02/29/2016   LDLDIRECT 230.7 03/18/2013   LDLCALC 220 (H) 02/29/2016   ALT 10 02/29/2016   AST 17 02/29/2016   NA 138 02/29/2016   K 3.9 02/29/2016   CL 103 02/29/2016   CREATININE 0.88 02/29/2016   BUN 23 02/29/2016   CO2 26 02/29/2016   TSH 2.19 09/29/2009    HGBA1C 6.5 10/19/2015    Lab Results  Component Value Date   TSH 2.19 09/29/2009   Lab Results  Component Value Date   WBC 4.4 12/16/2013   HGB 12.2 12/16/2013   HCT 36.7 12/16/2013   MCV 92.0 12/16/2013   PLT 279.0 12/16/2013   Lab Results  Component Value Date   NA 138 02/29/2016   K 3.9 02/29/2016   CO2 26 02/29/2016   GLUCOSE 130 (H) 02/29/2016   BUN 23 02/29/2016   CREATININE 0.88 02/29/2016   BILITOT 0.5 02/29/2016   ALKPHOS 77 02/29/2016   AST 17 02/29/2016   ALT 10 02/29/2016   PROT 8.6 (H) 02/29/2016   ALBUMIN 4.2 02/29/2016   CALCIUM 10.2 02/29/2016   GFR 79.29 02/29/2016   Lab Results  Component Value Date   CHOL 307 (H) 02/29/2016   Lab Results  Component Value Date   HDL 63.20 02/29/2016   Lab Results  Component Value Date   LDLCALC 220 (H) 02/29/2016   Lab Results  Component Value Date   TRIG 119.0 02/29/2016   Lab Results  Component Value Date   CHOLHDL 5 02/29/2016   Lab Results  Component Value Date   HGBA1C 6.5 10/19/2015         Assessment & Plan:   Problem List Items Addressed This Visit      Unprioritized   Dysuria   Relevant Orders   POCT Urinalysis Dipstick (Automated) (Completed)   Urine Culture   Essential hypertension    Stable con't meds      Relevant Medications   amLODipine (NORVASC) 5 MG tablet   ezetimibe (ZETIA) 10 MG tablet   hydrochlorothiazide (HYDRODIURIL) 25 MG tablet   rosuvastatin (CRESTOR) 5 MG tablet   Other Relevant Orders   Comprehensive metabolic panel    Other Visit Diagnoses    Hyperlipidemia, unspecified hyperlipidemia type    -  Primary   Relevant Medications   amLODipine (NORVASC) 5 MG tablet   ezetimibe (ZETIA) 10 MG tablet   hydrochlorothiazide (HYDRODIURIL) 25 MG tablet   rosuvastatin (CRESTOR) 5 MG tablet   Other Relevant Orders   Lipid panel   Wheezing       Relevant Medications   albuterol (PROVENTIL) (2.5 MG/3ML) 0.083% nebulizer solution 2.5 mg (Completed)    beclomethasone (QVAR) 40 MCG/ACT inhaler   htn       Relevant Medications   amLODipine (NORVASC) 5 MG tablet   ezetimibe (ZETIA) 10 MG tablet   hydrochlorothiazide (HYDRODIURIL) 25 MG tablet   rosuvastatin (CRESTOR) 5 MG tablet   Abnormal EKG       Relevant Medications   hydrochlorothiazide (HYDRODIURIL) 25 MG tablet   Dysphagia, pharyngoesophageal phase       Relevant Medications   pantoprazole (PROTONIX) 40 MG tablet      I have changed Ms. Bord's ezetimibe. I am also having her maintain her meloxicam, albuterol, amLODipine, beclomethasone, hydrochlorothiazide, pantoprazole, and rosuvastatin. We administered albuterol.  Meds ordered this encounter  Medications  . albuterol (PROVENTIL) (2.5 MG/3ML) 0.083% nebulizer solution 2.5 mg  . amLODipine (NORVASC) 5 MG tablet    Sig: TAKE 1 TABLET(5 MG) BY MOUTH DAILY    Dispense:  90 tablet    Refill:  0  . beclomethasone (QVAR) 40 MCG/ACT inhaler    Sig: Inhale 2 puffs into the lungs 2 (two) times daily.    Dispense:  1 Inhaler    Refill:  5  . ezetimibe (ZETIA) 10 MG tablet    Sig: Take 1 tablet (10 mg total) by mouth daily.    Dispense:  30 tablet    Refill:  5  . hydrochlorothiazide (HYDRODIURIL) 25 MG tablet    Sig: Take 1 tablet (25 mg total) by mouth daily.    Dispense:  90 tablet    Refill:  3  .  pantoprazole (PROTONIX) 40 MG tablet    Sig: Take 1 tablet (40 mg total) by mouth daily.    Dispense:  30 tablet    Refill:  3  . rosuvastatin (CRESTOR) 5 MG tablet    Sig: Take 1 tablet by mouth up to 3 times weekly as tolerated    Dispense:  12 tablet    Refill:  3    CMA served as scribe during this visit. History, Physical and Plan performed by medical provider. Documentation and orders reviewed and attested to.  Ann Held, DO

## 2016-05-31 NOTE — Progress Notes (Signed)
Pre visit review using our clinic review tool, if applicable. No additional management support is needed unless otherwise documented below in the visit note. 

## 2016-06-02 MED ORDER — AMLODIPINE BESYLATE 5 MG PO TABS: ORAL_TABLET | ORAL | 0 refills | Status: DC

## 2016-06-02 MED ORDER — ROSUVASTATIN CALCIUM 5 MG PO TABS: ORAL_TABLET | ORAL | 3 refills | Status: DC

## 2016-06-02 MED ORDER — PANTOPRAZOLE SODIUM 40 MG PO TBEC: 40.0000 mg | DELAYED_RELEASE_TABLET | Freq: Every day | ORAL | 3 refills | Status: DC

## 2016-06-02 MED ORDER — BECLOMETHASONE DIPROPIONATE 40 MCG/ACT IN AERS: 2.0000 | INHALATION_SPRAY | Freq: Two times a day (BID) | RESPIRATORY_TRACT | 5 refills | Status: DC

## 2016-06-02 MED ORDER — EZETIMIBE 10 MG PO TABS: 10.0000 mg | ORAL_TABLET | Freq: Every day | ORAL | 5 refills | Status: DC

## 2016-06-02 MED ORDER — HYDROCHLOROTHIAZIDE 25 MG PO TABS: 25.0000 mg | ORAL_TABLET | Freq: Every day | ORAL | 3 refills | Status: DC

## 2016-06-02 NOTE — Assessment & Plan Note (Signed)
Stable con't meds 

## 2016-06-03 LAB — URINE CULTURE

## 2016-06-04 ENCOUNTER — Telehealth: Payer: Self-pay | Admitting: Family Medicine

## 2016-06-04 MED ORDER — FLUTICASONE PROPIONATE HFA 110 MCG/ACT IN AERO: 2.0000 | INHALATION_SPRAY | Freq: Two times a day (BID) | RESPIRATORY_TRACT | 3 refills | Status: DC

## 2016-06-04 NOTE — Telephone Encounter (Signed)
[facility] [phone] - [place], [place] [phone] (Phone) [phone] (Fax)     Reason for call:  Insurance will not cover beclomethasone (QVAR) 40 MCG/ACT inhaler but will cover  arnuity breathing inhaler or flovent, please advise

## 2016-06-04 NOTE — Telephone Encounter (Signed)
Updated medication list and sent in flovent.

## 2016-06-04 NOTE — Telephone Encounter (Signed)
flovent 110  2 puffs bid #1   

## 2016-07-18 ENCOUNTER — Encounter: Payer: Self-pay | Admitting: Registered"

## 2016-07-18 ENCOUNTER — Encounter: Payer: Medicare Other | Attending: Family Medicine | Admitting: Registered"

## 2016-07-18 DIAGNOSIS — E7800 Pure hypercholesterolemia, unspecified: Secondary | ICD-10-CM

## 2016-07-18 DIAGNOSIS — Z713 Dietary counseling and surveillance: Secondary | ICD-10-CM | POA: Diagnosis not present

## 2016-07-18 NOTE — Progress Notes (Signed)
Medical Nutrition Therapy:  Appt start time: 1400 end time:  1500.   Assessment:  Primary concerns today: Pt states she wants a healthy meal plan she can follow to help high cholesterol. Pt states she has had two doctors try several cholesterol medications, all of which have been discontinued due to side effects.  Pt lives alone and states she eats out often but wants to cook simple, quick meals. Pt reports too many salads makes her stomach raw.  Physical activity is limited, but she has [employer], and when the weather was warmer she used to do the weight machines.  Pt reports difficultly swallowing beef, salmon with symptom description that they stop half way down her throat.  Preferred Learning Style:   No preference indicated   Learning Readiness:   Ready  Change in progress  MEDICATIONS: reviewed   DIETARY INTAKE:  Usual eating pattern includes 3 meals and 0 snacks per day.  Avoided foods include oranges, tangerine, grapes.  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   Estimated energy needs: 1600 calories 180 g carbohydrates 120 g protein 44 g fat  Progress Towards Goal(s):  In progress.   Nutritional Diagnosis:  NI-5.6.3 Inappropriate intake of fats (specify): saturated As related to frequent dining out.  As evidenced by diet recall.    Intervention:  Nutrition Education. Discussed My Plate and healthy eating. Taught how to read food labels and identify sources of saturated fat. Discussed how to choose healthy options when dining out. Discussed advantages and strategies for cooking more meals at home.    Plan: websites: Healthydiningfinder.com calorieking.com   Aim to cook dinner with enough to have left overs for lunch. Include plenty of fruits & vegetables.  Consider having a variety of breakfast choices: for example Cheerios, applesauce with yogurt, scrambled eggs with grits  Good sources of fiber are sweet potatoes, skin on white potatoes, vegetables, nuts: peanuts, pistachios, almonds, walnuts  Margarine brand Benecol is good to help reduce cholesterol.  Consider going to the soul food restaurant less often.  Physical activity: get back into the physical therapy routine[facility]  Teaching Method Utilized:  Visual Auditory  Handouts given during visit include:  Types of Fat  My Plate Planner   Barriers to learning/adherence to lifestyle change: none  Demonstrated degree of understanding via:  Teach Back   Monitoring/Evaluation:  Dietary intake, exercise, cholesterol labs, and body weight prn.

## 2016-07-18 NOTE — Patient Instructions (Addendum)
websites: Healthydiningfinder.com calorieking.com  Aim to cook dinner with enough to have left overs for lunch. Include plenty of fruits & vegetables.  Consider having a variety of breakfast choices: for example Cheerios, applesauce with yogurt, scrambled eggs with grits  Good sources of fiber are sweet potatoes, skin on white potatoes, vegetables, nuts: peanuts, pistachios, almonds, walnuts Margarine brand Benecol is good to help reduce cholesterol.  Consider going to the soul food restaurant less often.  Physical activity: get back into the physical therapy routine, and [facility]

## 2016-09-11 ENCOUNTER — Other Ambulatory Visit: Payer: Self-pay | Admitting: Family Medicine

## 2016-09-11 DIAGNOSIS — Z1231 Encounter for screening mammogram for malignant neoplasm of breast: Secondary | ICD-10-CM

## 2016-10-02 ENCOUNTER — Ambulatory Visit
Admission: RE | Admit: 2016-10-02 | Discharge: 2016-10-02 | Disposition: A | Discharge status: Home / Self Care | Payer: Medicare Other | Source: Ambulatory Visit | Attending: Family Medicine | Admitting: Family Medicine

## 2016-10-02 ENCOUNTER — Ambulatory Visit: Payer: Medicare Other

## 2016-10-02 DIAGNOSIS — Z1231 Encounter for screening mammogram for malignant neoplasm of breast: Secondary | ICD-10-CM | POA: Diagnosis not present

## 2016-10-05 DIAGNOSIS — H04123 Dry eye syndrome of bilateral lacrimal glands: Secondary | ICD-10-CM | POA: Diagnosis not present

## 2017-01-15 ENCOUNTER — Telehealth: Payer: Self-pay | Admitting: *Deleted

## 2017-01-15 NOTE — Telephone Encounter (Signed)
THIS NOTE WAS PLACED IN DAUGHTER'S [Felicia Martin] CHART IN ERROR; have CMA printing new handicap placard application to have signed by provider, as patient is waiting at front desk/SLS 10/30 Telephone10/17/2018 Archivist at [place], Felicia Martin, [place]  Family Medicine   handicap place card  Reason for call   Conversation: handicap place card  [facility])  January 10, 2017  Me  1:57 PM  Note    Received application from provider in paperwork today; applicant must complete and sign her portion [facility] to inform] and then copy will be made for scan/SLS 10/25    Felicia Martin, RMA  7:53 AM  Note    Form placed on PCP desk to sign off on      January 09, 2017  [name], [place]  to [place], [place] . Felicia Martin, RMA   7:22 PM  There's a form that needs to be filled out before the provider signs off on it. I will ask in the morning to see if by chance we have some in the office but to the best of my knowledge this form often come from the [facility]  January 04, 2017    3:19 PM  [name] routed this conversation to [name], [place]   January 02, 2017    1:26 PM  [name] routed this conversation to [name], CMA    1:26 PM   Felicia Martin contacted Felicia Martin, Felicia Martin  1:25 PM  Note    Relation to pt: self  Call back [place]:  Reason for call:  Patient requesting renewal handicap place card, please advise

## 2017-01-29 ENCOUNTER — Ambulatory Visit (INDEPENDENT_AMBULATORY_CARE_PROVIDER_SITE_OTHER): Payer: Medicare Other | Admitting: Family Medicine

## 2017-01-29 ENCOUNTER — Encounter: Payer: Self-pay | Admitting: Family Medicine

## 2017-01-29 VITALS — BP 137/69 | HR 71 | Temp 97.8°F | Resp 18 | Ht 60.0 in | Wt 209.8 lb

## 2017-01-29 DIAGNOSIS — E785 Hyperlipidemia, unspecified: Secondary | ICD-10-CM

## 2017-01-29 DIAGNOSIS — I10 Essential (primary) hypertension: Secondary | ICD-10-CM | POA: Diagnosis not present

## 2017-01-29 NOTE — Assessment & Plan Note (Addendum)
Well controlled, no changes to meds. Encouraged heart healthy diet such as the DASH diet and exercise as tolerated.  bp came down with time in office 137/69

## 2017-01-29 NOTE — Patient Instructions (Signed)

## 2017-01-29 NOTE — Assessment & Plan Note (Signed)
Encouraged heart healthy diet, increase exercise, avoid trans fats, consider a krill oil cap daily Pt stopped zetia and crestor due to myalgias

## 2017-01-29 NOTE — Progress Notes (Signed)
Patient ID: Felicia Martin, female   DOB: May 14, 1932, 83 y.o.   MRN: [id number]     Subjective:  I acted as a Education administrator for Dr. Carollee Herter.  Guerry Bruin, [place]   Patient ID: Felicia Martin, female    DOB: [birth year], 83 y.o.   MRN: [id number]  Chief Complaint  Patient presents with  . Hypertension  . Hyperlipidemia    HPI  Patient is in today for follow blood pressure and cholesterol.  She has not been on anything for cholesterol since one month after her last visit.  She has had a lot of things going on in her family that has her stressed.  She stopped the statin because of leg pain.  Patient Care Team: Carollee Herter, Alferd Apa, DO as PCP - General (Family Medicine) Inda Castle, MD as Consulting Physician (Gastroenterology)   Past Medical History:  Diagnosis Date  . Asthma   . GERD (gastroesophageal reflux disease)   . Hypercholesterolemia   . Hypertension   . Pulmonary fibrosis ([employer])   . Sarcoidosis   . Surgical history of tubal ligation 1960s    Past Surgical History:  Procedure Laterality Date  . COLONOSCOPY  12/09/14  . ESOPHAGOGASTRODUODENOSCOPY  12/09/14  . LUNG SURGERY  1960   Right lung  . Maloney dilation  12/09/14  . TUBAL LIGATION  1960    Family History  Problem Relation Age of Onset  . Diabetes Sister        x's 3  . Liver cancer Mother   . Liver cancer Father   . Cancer Father   . Cancer Brother   . Diabetes Brother   . Hypertension Sister        x's 3  . Hyperlipidemia Unknown   . Breast cancer Neg Hx     Social History   Socioeconomic History  . Marital status: Single    Spouse name: Not on file  . Number of children: 4  . Years of education: Not on file  . Highest education level: Not on file  Social Needs  . Financial resource strain: Not on file  . Food insecurity - worry: Not on file  . Food insecurity - inability: Not on file  . Transportation needs - medical: Not on file  . Transportation needs - non-medical: Not on file    Occupational History  . Occupation: Chartered certified accountant  Tobacco Use  . Smoking status: Former Smoker    Packs/day: 0.50    Years: 40.00    Pack years: 20.00    Types: Cigarettes    Last attempt to quit: 05/25/1999    Years since quitting: 17.6  . Smokeless tobacco: Never Used  Substance and Sexual Activity  . Alcohol use: No  . Drug use: No  . Sexual activity: No  Other Topics Concern  . Not on file  Social History Narrative  . Not on file    Outpatient Medications Prior to Visit  Medication Sig Dispense Refill  . albuterol (PROVENTIL HFA;VENTOLIN HFA) 108 (90 Base) MCG/ACT inhaler Inhale 2 puffs into the lungs every 6 (six) hours as needed. 1 Inhaler 1  . amLODipine (NORVASC) 5 MG tablet TAKE 1 TABLET(5 MG) BY MOUTH DAILY 90 tablet 0  . fluticasone (FLOVENT HFA) 110 MCG/ACT inhaler Inhale 2 puffs into the lungs 2 (two) times daily. 1 Inhaler 3  . meloxicam (MOBIC) 15 MG tablet TAKE 1 TABLET BY MOUTH EVERY DAY AS NEEDED FOR PAIN 30 tablet 2  . ezetimibe (ZETIA)  10 MG tablet Take 1 tablet (10 mg total) by mouth daily. (Patient not taking: Reported on 07/18/2016) 30 tablet 5  . hydrochlorothiazide (HYDRODIURIL) 25 MG tablet Take 1 tablet (25 mg total) by mouth daily. (Patient not taking: Reported on 07/18/2016) 90 tablet 3  . pantoprazole (PROTONIX) 40 MG tablet Take 1 tablet (40 mg total) by mouth daily. (Patient not taking: Reported on 07/18/2016) 30 tablet 3  . rosuvastatin (CRESTOR) 5 MG tablet Take 1 tablet by mouth up to 3 times weekly as tolerated (Patient not taking: Reported on 07/18/2016) 12 tablet 3   No facility-administered medications prior to visit.     Allergies  Allergen Reactions  . Statins Other (See Comments)    Muscle aches and weakness  . Zetia [Ezetimibe] Other (See Comments)    Muscle pain  . Fenofibrate Rash    Review of Systems  Constitutional: Negative for fever and malaise/fatigue.  HENT: Negative for congestion.   Eyes: Negative for blurred vision.   Respiratory: Negative for cough and shortness of breath.   Cardiovascular: Negative for chest pain, palpitations and leg swelling.  Gastrointestinal: Negative for vomiting.  Musculoskeletal: Negative for back pain.  Skin: Negative for rash.  Neurological: Negative for loss of consciousness and headaches.       Objective:    Physical Exam  Constitutional: She is oriented to person, place, and time. She appears well-developed and well-nourished.  HENT:  Head: Normocephalic and atraumatic.  Eyes: Conjunctivae and EOM are normal.  Neck: Normal range of motion. Neck supple. No JVD present. Carotid bruit is not present. No thyromegaly present.  Cardiovascular: Normal rate, regular rhythm and normal heart sounds.  No murmur heard. Pulmonary/Chest: Effort normal and breath sounds normal. No respiratory distress. She has no wheezes. She has no rales. She exhibits no tenderness.  Musculoskeletal: She exhibits no edema.  Neurological: She is alert and oriented to person, place, and time.  Psychiatric: She has a normal mood and affect. Her behavior is normal. Judgment and thought content normal.  Nursing note and vitals reviewed.   BP 137/69 (BP Location: Left Arm, Cuff Size: Large)   Pulse 71   Temp 97.8 F (36.6 C)   Resp 18   Ht 5' (1.524 m)   Wt 209 lb 12.8 oz (95.2 kg)   LMP 03/17/2014   SpO2 96%   BMI 40.97 kg/m  Wt Readings from Last 3 Encounters:  01/29/17 209 lb 12.8 oz (95.2 kg)  07/18/16 208 lb 14.4 oz (94.8 kg)  05/31/16 208 lb 3.2 oz (94.4 kg)   BP Readings from Last 3 Encounters:  01/29/17 137/69  05/31/16 126/68  05/18/16 (!) 150/74     Immunization History  Administered Date(s) Administered  . Td 03/19/2002    Health Maintenance  Topic Date Due  . PNA vac Low Risk Adult (1 of 2 - PCV13) 01/23/2000  . TETANUS/TDAP  03/18/2022 (Originally 03/19/2012)  . INFLUENZA VACCINE  06/17/2026 (Originally 10/17/2016)  . MAMMOGRAM  10/02/2017  . COLONOSCOPY  12/08/2017   . DEXA SCAN  Completed    Lab Results  Component Value Date   WBC 4.4 12/16/2013   HGB 12.2 12/16/2013   HCT 36.7 12/16/2013   PLT 279.0 12/16/2013   GLUCOSE 130 (H) 02/29/2016   CHOL 307 (H) 02/29/2016   TRIG 119.0 02/29/2016   HDL 63.20 02/29/2016   LDLDIRECT 230.7 03/18/2013   LDLCALC 220 (H) 02/29/2016   ALT 10 02/29/2016   AST 17 02/29/2016   NA 138  02/29/2016   K 3.9 02/29/2016   CL 103 02/29/2016   CREATININE 0.88 02/29/2016   BUN 23 02/29/2016   CO2 26 02/29/2016   TSH 2.19 09/29/2009   HGBA1C 6.5 10/19/2015    Lab Results  Component Value Date   TSH 2.19 09/29/2009   Lab Results  Component Value Date   WBC 4.4 12/16/2013   HGB 12.2 12/16/2013   HCT 36.7 12/16/2013   MCV 92.0 12/16/2013   PLT 279.0 12/16/2013   Lab Results  Component Value Date   NA 138 02/29/2016   K 3.9 02/29/2016   CO2 26 02/29/2016   GLUCOSE 130 (H) 02/29/2016   BUN 23 02/29/2016   CREATININE 0.88 02/29/2016   BILITOT 0.5 02/29/2016   ALKPHOS 77 02/29/2016   AST 17 02/29/2016   ALT 10 02/29/2016   PROT 8.6 (H) 02/29/2016   ALBUMIN 4.2 02/29/2016   CALCIUM 10.2 02/29/2016   GFR 79.29 02/29/2016   Lab Results  Component Value Date   CHOL 307 (H) 02/29/2016   Lab Results  Component Value Date   HDL 63.20 02/29/2016   Lab Results  Component Value Date   LDLCALC 220 (H) 02/29/2016   Lab Results  Component Value Date   TRIG 119.0 02/29/2016   Lab Results  Component Value Date   CHOLHDL 5 02/29/2016   Lab Results  Component Value Date   HGBA1C 6.5 10/19/2015         Assessment & Plan:   Problem List Items Addressed This Visit      Unprioritized   Essential hypertension    Well controlled, no changes to meds. Encouraged heart healthy diet such as the DASH diet and exercise as tolerated.  bp came down with time in office 137/69      Relevant Orders   Lipid panel   Comprehensive metabolic panel   Hyperlipidemia LDL goal <100 - Primary    Encouraged  heart healthy diet, increase exercise, avoid trans fats, consider a krill oil cap daily Pt stopped zetia and crestor due to myalgias       Relevant Orders   Lipid panel   Comprehensive metabolic panel      I have discontinued Kaiesha T. Schepp's ezetimibe, hydrochlorothiazide, pantoprazole, and rosuvastatin. I am also having her maintain her meloxicam, albuterol, amLODipine, and fluticasone.  No orders of the defined types were placed in this encounter.   CMA served as Education administrator during this visit. History, Physical and Plan performed by medical provider. Documentation and orders reviewed and attested to.  Ann Held, DO

## 2017-01-30 ENCOUNTER — Other Ambulatory Visit (INDEPENDENT_AMBULATORY_CARE_PROVIDER_SITE_OTHER): Payer: Medicare Other

## 2017-01-30 DIAGNOSIS — E785 Hyperlipidemia, unspecified: Secondary | ICD-10-CM | POA: Diagnosis not present

## 2017-01-30 DIAGNOSIS — I10 Essential (primary) hypertension: Secondary | ICD-10-CM | POA: Diagnosis not present

## 2017-01-30 LAB — COMPREHENSIVE METABOLIC PANEL
ALBUMIN: 4.1 g/dL (ref 3.5–5.2)
ALT: 7 U/L (ref 0–35)
AST: 15 U/L (ref 0–37)
Alkaline Phosphatase: 71 U/L (ref 39–117)
BUN: 17 mg/dL (ref 6–23)
CALCIUM: 9.8 mg/dL (ref 8.4–10.5)
CHLORIDE: 103 meq/L (ref 96–112)
CO2: 28 meq/L (ref 19–32)
Creatinine, Ser: 0.75 mg/dL (ref 0.40–1.20)
GFR: 95.14 mL/min (ref >60.00)
Glucose, Bld: 110 mg/dL — ABNORMAL HIGH (ref 70–99)
POTASSIUM: 3.8 meq/L (ref 3.5–5.1)
SODIUM: 139 meq/L (ref 135–145)
Total Bilirubin: 0.5 mg/dL (ref 0.2–1.2)
Total Protein: 8.5 g/dL — ABNORMAL HIGH (ref 6.0–8.3)

## 2017-01-30 LAB — LIPID PANEL
CHOL/HDL RATIO: 6
CHOLESTEROL: 317 mg/dL — AB (ref 0–200)
HDL: 56.8 mg/dL (ref >39.00)
LDL CALC: 238 mg/dL — AB (ref 0–99)
NonHDL: 260.15
TRIGLYCERIDES: 112 mg/dL (ref 0.0–149.0)
VLDL: 22.4 mg/dL (ref 0.0–40.0)

## 2017-02-06 ENCOUNTER — Encounter: Payer: Self-pay | Admitting: *Deleted

## 2017-02-06 ENCOUNTER — Other Ambulatory Visit: Payer: Self-pay | Admitting: *Deleted

## 2017-02-06 DIAGNOSIS — E785 Hyperlipidemia, unspecified: Secondary | ICD-10-CM

## 2017-02-06 DIAGNOSIS — I10 Essential (primary) hypertension: Secondary | ICD-10-CM

## 2017-02-18 ENCOUNTER — Telehealth: Payer: Self-pay

## 2017-02-18 NOTE — Telephone Encounter (Signed)
Patient called in to say she will be in today to pick up copy of lab results. Copy left at front desk for patient pick up.

## 2017-03-10 DIAGNOSIS — R040 Epistaxis: Secondary | ICD-10-CM | POA: Diagnosis not present

## 2017-03-10 DIAGNOSIS — I10 Essential (primary) hypertension: Secondary | ICD-10-CM | POA: Diagnosis not present

## 2017-03-14 ENCOUNTER — Ambulatory Visit: Payer: Self-pay

## 2017-03-14 NOTE — Telephone Encounter (Signed)
Daughter called on pt's behalf to report she was evaluated at Urgent Care on 12/23 for c/o headache, nose bleed, and was found to have very high BP; daughter said she was told that her BP was "at stroke level".  Was treated for nosebleed with clots and advised to f/u with her PCP ASAP.  Daughter is not with the pt. at present time.  Doesn't know what BP is today.  Reported that, yesterday, the patient obtained BP readings in range of 140-150/80-90.  Reported no further nosebleed.  Stated pt. has not c/o continued headache, any blurred vision, chest pain or shortness of breath.  Daughter stated she doesn't know how the pt. is feeling today, when questioned.  Care advice given per protocol.  Appt. scheduled with MD tomorrow at 9:15 AM.  Daughter verb. understanding and agrees with plan.       Reason for Disposition . Systolic BP  >= 924 OR Diastolic >= 268  Answer Assessment - Initial Assessment Questions 1. BLOOD PRESSURE: "What is the blood pressure?" "Did you take at least two measurements 5 minutes apart?"     BP 176/90 on 12/24   146-150/80-90 12/26 2. ONSET: "When did you take your blood pressure?"     See above  3. HOW: "How did you obtain the blood pressure?" (e.g., visiting nurse, automatic home BP monitor)    Digital home BP monitor 4. HISTORY: "Do you have a history of high blood pressure?"     Yes;  Seen in UC on Sunday, 12/23; informed her blood pressure was very high; "at stroke level" 5. MEDICATIONS: "Are you taking any medications for blood pressure?" "Have you missed any doses recently?"     Daughter is not sure if pt. has missed doses.  6. OTHER SYMPTOMS: "Do you have any symptoms?" (e.g., headache, chest pain, blurred vision, difficulty breathing, weakness)     Nose bleed heavily on Sun. 12/23; seen at [facility] on [address]. / c/o headache on Sunday; no c/o chest pain, or weakness 7. PREGNANCY: "Is there any chance you are pregnant?" "When was your last menstrual period?"  no  Protocols used: HIGH BLOOD PRESSURE-A-AH

## 2017-03-15 ENCOUNTER — Ambulatory Visit: Payer: Medicare Other | Admitting: Family Medicine

## 2017-03-15 ENCOUNTER — Encounter: Payer: Self-pay | Admitting: Family Medicine

## 2017-03-15 VITALS — BP 152/59 | HR 81 | Temp 97.0°F | Resp 16 | Ht 60.0 in | Wt 209.0 lb

## 2017-03-15 DIAGNOSIS — I10 Essential (primary) hypertension: Secondary | ICD-10-CM

## 2017-03-15 MED ORDER — AMLODIPINE BESYLATE 10 MG PO TABS: 10.0000 mg | ORAL_TABLET | Freq: Every day | ORAL | 3 refills | Status: DC

## 2017-03-15 NOTE — Progress Notes (Signed)
Chief Complaint  Patient presents with  . Hypertension    Pt reports bp has been up. Had a nose bleed Sunday     Subjective Felicia Martin is a 83 y.o. female who presents for hypertension follow up. She does monitor home blood pressures since Sun Blood pressures ranging from 140-150's/70-80's on average. She is now compliant with medications. Norvasc 5 mg/d. She had not been taking it routinely prior. Patient has these side effects of medication: none She is not adhering to a healthy diet overall. Current exercise: physically active at work   Past Medical History:  Diagnosis Date  . Asthma   . GERD (gastroesophageal reflux disease)   . Hypercholesterolemia   . Hypertension   . Pulmonary fibrosis ([place])   . Sarcoidosis   . Surgical history of tubal ligation 1960s   Review of Systems Cardiovascular: no chest pain Respiratory:  no shortness of breath  Exam BP (!) 152/59   Pulse 81   Temp (!) 97 F (36.1 C) (Oral)   Resp 16   Ht 5' (1.524 m)   Wt 209 lb (94.8 kg)   LMP 03/17/2014   SpO2 98%   BMI 40.82 kg/m  General:  well developed, well nourished, in no apparent distress Eyes: pupils equal and round, sclera anicteric without injection Heart: RRR, no bruits Lungs: clear to auscultation, no accessory muscle use Psych: well oriented with normal range of affect and appropriate judgment/insight  Essential hypertension - Plan: amLODipine (NORVASC) 10 MG tablet  Orders as above. Increase dose from 5 mg/d to 10 mg/d. Counseled on diet and exercise- healthy diet handout given. F/u in 1 mo. The patient voiced understanding and agreement to the plan.  [place], DO 03/15/17  9:29 AM

## 2017-03-15 NOTE — Patient Instructions (Addendum)
Aim to do some physical exertion for 150 minutes per week. This is typically divided into 5 days per week, 30 minutes per day. The activity should be enough to get your heart rate up. Anything is better than nothing if you have time constraints.  Keep checking BP's at home.  Take 2 tabs of Norvasc 5 mg tabs until you run out. A new dose has been called in.   Healthy Eating Plan Many factors influence your heart health, including eating and exercise habits. Heart (coronary) risk increases with abnormal blood fat (lipid) levels. Heart-healthy meal planning includes limiting unhealthy fats, increasing healthy fats, and making other small dietary changes. This includes maintaining a healthy body weight to help keep lipid levels within a normal range.  WHAT IS MY PLAN?  Your health care provider recommends that you:  Drink a glass of water before meals to help with satiety.  Eat slowly.  An alternative to the water is to add Metamucil. This will help with satiety as well. It does contain calories, unlike water.  WHAT TYPES OF FAT SHOULD I CHOOSE?  Choose healthy fats more often. Choose monounsaturated and polyunsaturated fats, such as olive oil and canola oil, flaxseeds, walnuts, almonds, and seeds.  Eat more omega-3 fats. Good choices include salmon, mackerel, sardines, tuna, flaxseed oil, and ground flaxseeds. Aim to eat fish at least two times each week.  Avoid foods with partially hydrogenated oils in them. These contain trans fats. Examples of foods that contain trans fats are stick margarine, some tub margarines, cookies, crackers, and other baked goods. If you are going to avoid a fat, this is the one to avoid!  WHAT GENERAL GUIDELINES DO I NEED TO FOLLOW?  Check food labels carefully to identify foods with trans fats. Avoid these types of options when possible.  Fill one half of your plate with vegetables and green salads. Eat 4-5 servings of vegetables per day. A serving of  vegetables equals 1 cup of raw leafy vegetables,  cup of raw or cooked cut-up vegetables, or  cup of vegetable juice.  Fill one fourth of your plate with whole grains. Look for the word "whole" as the first word in the ingredient list.  Fill one fourth of your plate with lean protein foods.  Eat 4-5 servings of fruit per day. A serving of fruit equals one medium whole fruit,  cup of dried fruit,  cup of fresh, frozen, or canned fruit. Try to avoid fruits in cups/syrups as the sugar content can be high.  Eat more foods that contain soluble fiber. Examples of foods that contain this type of fiber are apples, broccoli, carrots, beans, peas, and barley. Aim to get 20-30 g of fiber per day.  Eat more home-cooked food and less restaurant, buffet, and fast food.  Limit or avoid alcohol.  Limit foods that are high in starch and sugar.  Avoid fried foods when able.  Cook foods by using methods other than frying. Baking, boiling, grilling, and broiling are all great options. Other fat-reducing suggestions include: ? Removing the skin from poultry. ? Removing all visible fats from meats. ? Skimming the fat off of stews, soups, and gravies before serving them. ? Steaming vegetables in water or broth.  Lose weight if you are overweight. Losing just 5-10% of your initial body weight can help your overall health and prevent diseases such as diabetes and heart disease.  Increase your consumption of nuts, legumes, and seeds to 4-5 servings per week. One serving  of dried beans or legumes equals  cup after being cooked, one serving of nuts equals 1 ounces, and one serving of seeds equals  ounce or 1 tablespoon.  WHAT ARE GOOD FOODS CAN I EAT? Grains Grainy breads (try to find bread that is 3 g of fiber per slice or greater), oatmeal, light popcorn. Whole-grain cereals. Rice and pasta, including brown rice and those that are made with whole wheat. Edamame pasta is a great alternative to grain pasta.  It has a higher protein content. Try to avoid significant consumption of white bread, sugary cereals, or pastries/baked goods.  Vegetables All vegetables. Cooked white potatoes do not count as vegetables.  Fruits All fruits, but limit pineapple and bananas as these fruits have a higher sugar content.  Meats and Other Protein Sources Lean, well-trimmed beef, veal, pork, and lamb. Chicken and [place] without skin. All fish and shellfish. Wild duck, rabbit, pheasant, and venison. Egg whites or low-cholesterol egg substitutes. Dried beans, peas, lentils, and tofu.Seeds and most nuts.  Dairy Low-fat or nonfat cheeses, including ricotta, string, and mozzarella. Skim or 1% milk that is liquid, powdered, or evaporated. Buttermilk that is made with low-fat milk. Nonfat or low-fat yogurt. Soy/Almond milk are good alternatives if you cannot handle dairy.  Beverages Water is the best for you. Sports drinks with less sugar are more desirable unless you are a highly active athlete.  Sweets and Desserts Sherbets and fruit ices. Honey, jam, marmalade, jelly, and syrups. Dark chocolate.  Eat all sweets and desserts in moderation.  Fats and Oils Nonhydrogenated (trans-free) margarines. Vegetable oils, including soybean, sesame, sunflower, olive, peanut, safflower, corn, canola, and cottonseed. Salad dressings or mayonnaise that are made with a vegetable oil. Limit added fats and oils that you use for cooking, baking, salads, and as spreads.  Other Cocoa powder. Coffee and tea. Most condiments.  The items listed above may not be a complete list of recommended foods or beverages. Contact your dietitian for more options.

## 2017-03-22 NOTE — Progress Notes (Signed)
Subjective:   Felicia Martin is a 84 y.o. female who presents for Medicare Annual (Subsequent) preventive examination.  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  Review of Systems:  No ROS.  Medicare Wellness Visit. Additional risk factors are reflected in the social history. Cardiac Risk Factors include: advanced age (>32men, >29 women);hypertension;obesity (BMI >30kg/m2) Sleep patterns:  No issues Home Safety/Smoke Alarms: Feels safe in home. Smoke alarms in place. No stairs. Lives alone.    Female:   Pap- No longer doing routine screening due to age.    Mammo- Last 10/02/16:  normal     Dexa scan- ordered today      CCS- last 12/14/14: recall 3 yrs Objective:     Vitals: BP (!) 118/58 (BP Location: Left Arm, Patient Position: Sitting, Cuff Size: Normal)   Pulse 82   Ht 5' (1.524 m)   Wt 207 lb 12.8 oz (94.3 kg)   LMP 03/17/2014   SpO2 96%   BMI 40.58 kg/m   Body mass index is 40.58 kg/m.  Advanced Directives 03/26/2017 07/18/2016 02/23/2016 10/20/2015 02/23/2015 12/09/2014  Does Patient Have a Medical Advance Directive? Yes Yes Yes No Yes Yes  Type of Paramedic of [place];Living will - [place];Living will - Living will;Healthcare Power of [place];Living will  Does patient want to make changes to medical advance directive? No - Patient declined No - Patient declined - - No - Patient declined -  Copy of [place] in Chart? No - copy requested - No - copy requested - No - copy requested -  Would patient like information on creating a medical advance directive? - - - No - patient declined information - -    Tobacco Social History   Tobacco Use  Smoking Status Former Smoker  . Packs/day: 0.50  . Years: 40.00  . Pack years: 20.00  . Types: Cigarettes  . Last attempt to quit: 05/25/1999  . Years since quitting: 17.8  Smokeless Tobacco Never Used     Counseling given:  Not Answered   Clinical Intake: Pain : No/denies pain  Past Medical History:  Diagnosis Date  . Asthma   . GERD (gastroesophageal reflux disease)   . Hypercholesterolemia   . Hypertension   . Pulmonary fibrosis ([place])   . Sarcoidosis   . Surgical history of tubal ligation 1960s   Past Surgical History:  Procedure Laterality Date  . COLONOSCOPY  12/09/14  . ESOPHAGOGASTRODUODENOSCOPY  12/09/14  . LUNG SURGERY  1960   Right lung  . Maloney dilation  12/09/14  . TUBAL LIGATION  1960   Family History  Problem Relation Age of Onset  . Diabetes Sister        x's 3  . Alzheimer's disease Sister   . Liver cancer Mother   . Liver cancer Father   . Cancer Father   . Cancer Brother   . Diabetes Brother   . Hypertension Sister        x's 3  . Hyperlipidemia Unknown   . Breast cancer Neg Hx    Social History   Socioeconomic History  . Marital status: Single    Spouse name: None  . Number of children: 4  . Years of education: None  . Highest education level: None  Social Needs  . Financial resource strain: None  . Food insecurity - worry: None  . Food insecurity - inability: None  .  Transportation needs - medical: None  . Transportation needs - non-medical: None  Occupational History  . Occupation: Chartered certified accountant  Tobacco Use  . Smoking status: Former Smoker    Packs/day: 0.50    Years: 40.00    Pack years: 20.00    Types: Cigarettes    Last attempt to quit: 05/25/1999    Years since quitting: 17.8  . Smokeless tobacco: Never Used  Substance and Sexual Activity  . Alcohol use: No  . Drug use: No  . Sexual activity: No  Other Topics Concern  . None  Social History Narrative  . None    Outpatient Encounter Medications as of 03/26/2017  Medication Sig  . albuterol (PROVENTIL HFA;VENTOLIN HFA) 108 (90 Base) MCG/ACT inhaler Inhale 2 puffs into the lungs every 6 (six) hours as needed.  Marland Kitchen amLODipine (NORVASC) 10 MG tablet Take 1 tablet (10 mg total) by mouth daily.  .  fluticasone (FLOVENT HFA) 110 MCG/ACT inhaler Inhale 2 puffs into the lungs 2 (two) times daily.  . meloxicam (MOBIC) 15 MG tablet TAKE 1 TABLET BY MOUTH EVERY DAY AS NEEDED FOR PAIN   No facility-administered encounter medications on file as of 03/26/2017.     Activities of Daily Living In your present state of health, do you have any difficulty performing the following activities: 03/26/2017  Hearing? Y  Comment Has hearing aids but only wears them in crowds.  Vision? N  Comment wears glasses all the time. Follows with eye doctor every 6 months.  Difficulty concentrating or making decisions? N  Walking or climbing stairs? N  Dressing or bathing? N  Doing errands, shopping? N  Preparing Food and eating ? N  Using the Toilet? N  In the past six months, have you accidently leaked urine? N  Do you have problems with loss of bowel control? N  Managing your Medications? N  Managing your Finances? N  Housekeeping or managing your Housekeeping? N  Some recent data might be hidden    Patient Care Team: Carollee Herter, Alferd Apa, DO as PCP - General (Family Medicine) Inda Castle, MD (Inactive) as Consulting Physician (Gastroenterology)    Assessment:   This is a routine wellness examination for Felicia Martin. Physical assessment deferred to PCP.  Exercise Activities and Dietary recommendations Current Exercise Habits: The patient does not participate in regular exercise at present Diet (meal preparation, eat out, water intake, caffeinated beverages, dairy products, fruits and vegetables): in general, a "healthy" diet  , well balanced    Goals    . Continue to improve diet.   (pt-stated)    . Lower cholesterol (pt-stated)       Fall Risk Fall Risk  03/26/2017 02/23/2016 11/17/2015 09/01/2015 02/23/2015  Falls in the past year? No No No No No  Risk for fall due to : - - - - History of fall(s)  Risk for fall due to: Comment - - - - Fell 2 years ago climbing up on something; no injury.       Depression Screen PHQ 2/9 Scores 03/26/2017 07/18/2016 02/23/2016 11/17/2015  PHQ - 2 Score 0 0 0 0     Cognitive Function MMSE - Mini Mental State Exam 03/26/2017 02/23/2016 02/23/2015  Orientation to time 5 5 5   Orientation to Place 5 5 5   Registration 3 3 3   Attention/ Calculation 5 5 5   Recall 3 2 3   Language- name 2 objects 2 2 2   Language- repeat 1 1 1   Language- follow 3 step  command 3 3 3   Language- read & follow direction 1 1 1   Write a sentence 1 1 1   Copy design 1 1 1   Total score 30 29 30         Immunization History  Administered Date(s) Administered  . Td 03/19/2002   Screening Tests Health Maintenance  Topic Date Due  . TETANUS/TDAP  03/18/2022 (Originally 03/19/2012)  . PNA vac Low Risk Adult (1 of 2 - PCV13) 03/19/2026 (Originally 01/23/2000)  . INFLUENZA VACCINE  06/17/2026 (Originally 10/17/2016)  . MAMMOGRAM  10/02/2017  . COLONOSCOPY  12/08/2017  . DEXA SCAN  Completed      Plan:   Follow up with PCP today as scheduled.  Continue to eat heart healthy diet (full of fruits, vegetables, whole grains, lean protein, water--limit salt, fat, and sugar intake) and increase physical activity as tolerated.  Continue doing brain stimulating activities (puzzles, reading, adult coloring books, staying active) to keep memory sharp.   Bring a copy of your living will and/or healthcare power of attorney to your next office visit.  I have ordered your bone density scan. Please schedule.   I have personally reviewed and noted the following in the patient's chart:   . Medical and social history . Use of alcohol, tobacco or illicit drugs  . Current medications and supplements . Functional ability and status . Nutritional status . Physical activity . Advanced directives . List of other physicians . Hospitalizations, surgeries, and ER visits in previous 12 months . Vitals . Screenings to include cognitive, depression, and falls . Referrals and appointments  In  addition, I have reviewed and discussed with patient certain preventive protocols, quality metrics, and best practice recommendations. A written personalized care plan for preventive services as well as general preventive health recommendations were provided to patient.     Shela Nevin, [place]  03/26/2017

## 2017-03-26 ENCOUNTER — Ambulatory Visit (INDEPENDENT_AMBULATORY_CARE_PROVIDER_SITE_OTHER): Payer: Medicare Other | Admitting: Family Medicine

## 2017-03-26 ENCOUNTER — Encounter: Payer: Self-pay | Admitting: Family Medicine

## 2017-03-26 VITALS — BP 118/58 | HR 82 | Ht 60.0 in | Wt 207.8 lb

## 2017-03-26 DIAGNOSIS — M5135 Other intervertebral disc degeneration, thoracolumbar region: Secondary | ICD-10-CM

## 2017-03-26 DIAGNOSIS — Z780 Asymptomatic menopausal state: Secondary | ICD-10-CM | POA: Diagnosis not present

## 2017-03-26 DIAGNOSIS — I10 Essential (primary) hypertension: Secondary | ICD-10-CM

## 2017-03-26 DIAGNOSIS — Z0000 Encounter for general adult medical examination without abnormal findings: Secondary | ICD-10-CM | POA: Diagnosis not present

## 2017-03-26 DIAGNOSIS — R011 Cardiac murmur, unspecified: Secondary | ICD-10-CM | POA: Diagnosis not present

## 2017-03-26 MED ORDER — AMLODIPINE BESYLATE 5 MG PO TABS: 5.0000 mg | ORAL_TABLET | Freq: Every day | ORAL | 3 refills | Status: DC

## 2017-03-26 MED ORDER — MELOXICAM 15 MG PO TABS: ORAL_TABLET | ORAL | 2 refills | Status: DC

## 2017-03-26 NOTE — Assessment & Plan Note (Signed)
Stable Pt was not taking her bp meds last visit-- -so she did not inc to 10 mg norvasc.  She just started taking her 5 mg again rto 3 months or sooner prn

## 2017-03-26 NOTE — Patient Instructions (Addendum)
I have ordered your bone density scan. Please schedule.  Continue to eat heart healthy diet (full of fruits, vegetables, whole grains, lean protein, water--limit salt, fat, and sugar intake) and increase physical activity as tolerated.  Continue doing brain stimulating activities (puzzles, reading, adult coloring books, staying active) to keep memory sharp.   Bring a copy of your living will and/or healthcare power of attorney to your next office visit.   Ms. Felicia Martin , Thank you for taking time to come for your Medicare Wellness Visit. I appreciate your ongoing commitment to your health goals. Please review the following plan we discussed and let me know if I can assist you in the future.   These are the goals we discussed: Goals    . Continue to improve diet.   (pt-stated)    . Lower cholesterol (pt-stated)       This is a list of the screening recommended for you and due dates:  Health Maintenance  Topic Date Due  . Tetanus Vaccine  03/18/2022*  . Pneumonia vaccines (1 of 2 - PCV13) 03/19/2026*  . Flu Shot  06/17/2026*  . Mammogram  10/02/2017  . Colon Cancer Screening  12/08/2017  . DEXA scan (bone density measurement)  Completed  *Topic was postponed. The date shown is not the original due date.    Health Maintenance for Postmenopausal Women Menopause is a normal process in which your reproductive ability comes to an end. This process happens gradually over a span of months to years, usually between the ages of 37 and 71. Menopause is complete when you have missed 12 consecutive menstrual periods. It is important to talk with your health care provider about some of the most common conditions that affect postmenopausal women, such as heart disease, cancer, and bone loss (osteoporosis). Adopting a healthy lifestyle and getting preventive care can help to promote your health and wellness. Those actions can also lower your chances of developing some of these common conditions. What  should I know about menopause? During menopause, you may experience a number of symptoms, such as:  Moderate-to-severe hot flashes.  Night sweats.  Decrease in sex drive.  Mood swings.  Headaches.  Tiredness.  Irritability.  Memory problems.  Insomnia.  Choosing to treat or not to treat menopausal changes is an individual decision that you make with your health care provider. What should I know about hormone replacement therapy and supplements? Hormone therapy products are effective for treating symptoms that are associated with menopause, such as hot flashes and night sweats. Hormone replacement carries certain risks, especially as you become older. If you are thinking about using estrogen or estrogen with progestin treatments, discuss the benefits and risks with your health care provider. What should I know about heart disease and stroke? Heart disease, heart attack, and stroke become more likely as you age. This may be due, in part, to the hormonal changes that your body experiences during menopause. These can affect how your body processes dietary fats, triglycerides, and cholesterol. Heart attack and stroke are both medical emergencies. There are many things that you can do to help prevent heart disease and stroke:  Have your blood pressure checked at least every 1-2 years. High blood pressure causes heart disease and increases the risk of stroke.  If you are 53-8 years old, ask your health care provider if you should take aspirin to prevent a heart attack or a stroke.  Do not use any tobacco products, including cigarettes, chewing tobacco, or electronic cigarettes.  If you need help quitting, ask your health care provider.  It is important to eat a healthy diet and maintain a healthy weight. ? Be sure to include plenty of vegetables, fruits, low-fat dairy products, and lean protein. ? Avoid eating foods that are high in solid fats, added sugars, or salt (sodium).  Get  regular exercise. This is one of the most important things that you can do for your health. ? Try to exercise for at least 150 minutes each week. The type of exercise that you do should increase your heart rate and make you sweat. This is known as moderate-intensity exercise. ? Try to do strengthening exercises at least twice each week. Do these in addition to the moderate-intensity exercise.  Know your numbers.Ask your health care provider to check your cholesterol and your blood glucose. Continue to have your blood tested as directed by your health care provider.  What should I know about cancer screening? There are several types of cancer. Take the following steps to reduce your risk and to catch any cancer development as early as possible. Breast Cancer  Practice breast self-awareness. ? This means understanding how your breasts normally appear and feel. ? It also means doing regular breast self-exams. Let your health care provider know about any changes, no matter how small.  If you are 54 or older, have a clinician do a breast exam (clinical breast exam or CBE) every year. Depending on your age, family history, and medical history, it may be recommended that you also have a yearly breast X-ray (mammogram).  If you have a family history of breast cancer, talk with your health care provider about genetic screening.  If you are at high risk for breast cancer, talk with your health care provider about having an MRI and a mammogram every year.  Breast cancer (BRCA) gene test is recommended for women who have family members with BRCA-related cancers. Results of the assessment will determine the need for genetic counseling and BRCA1 and for BRCA2 testing. BRCA-related cancers include these types: ? Breast. This occurs in males or females. ? Ovarian. ? Tubal. This may also be called fallopian tube cancer. ? Cancer of the abdominal or pelvic lining (peritoneal  cancer). ? Prostate. ? Pancreatic.  Cervical, Uterine, and Ovarian Cancer Your health care provider may recommend that you be screened regularly for cancer of the pelvic organs. These include your ovaries, uterus, and vagina. This screening involves a pelvic exam, which includes checking for microscopic changes to the surface of your cervix (Pap test).  For women ages 21-65, health care providers may recommend a pelvic exam and a Pap test every three years. For women ages 106-65, they may recommend the Pap test and pelvic exam, combined with testing for human papilloma virus (HPV), every five years. Some types of HPV increase your risk of cervical cancer. Testing for HPV may also be done on women of any age who have unclear Pap test results.  Other health care providers may not recommend any screening for nonpregnant women who are considered low risk for pelvic cancer and have no symptoms. Ask your health care provider if a screening pelvic exam is right for you.  If you have had past treatment for cervical cancer or a condition that could lead to cancer, you need Pap tests and screening for cancer for at least 20 years after your treatment. If Pap tests have been discontinued for you, your risk factors (such as having a new sexual partner) need  to be reassessed to determine if you should start having screenings again. Some women have medical problems that increase the chance of getting cervical cancer. In these cases, your health care provider may recommend that you have screening and Pap tests more often.  If you have a family history of uterine cancer or ovarian cancer, talk with your health care provider about genetic screening.  If you have vaginal bleeding after reaching menopause, tell your health care provider.  There are currently no reliable tests available to screen for ovarian cancer.  Lung Cancer Lung cancer screening is recommended for adults 2-27 years old who are at high risk for  lung cancer because of a history of smoking. A yearly low-dose CT scan of the lungs is recommended if you:  Currently smoke.  Have a history of at least 30 pack-years of smoking and you currently smoke or have quit within the past 15 years. A pack-year is smoking an average of one pack of cigarettes per day for one year.  Yearly screening should:  Continue until it has been 15 years since you quit.  Stop if you develop a health problem that would prevent you from having lung cancer treatment.  Colorectal Cancer  This type of cancer can be detected and can often be prevented.  Routine colorectal cancer screening usually begins at age 52 and continues through age 42.  If you have risk factors for colon cancer, your health care provider may recommend that you be screened at an earlier age.  If you have a family history of colorectal cancer, talk with your health care provider about genetic screening.  Your health care provider may also recommend using home test kits to check for hidden blood in your stool.  A small camera at the end of a tube can be used to examine your colon directly (sigmoidoscopy or colonoscopy). This is done to check for the earliest forms of colorectal cancer.  Direct examination of the colon should be repeated every 5-10 years until age 58. However, if early forms of precancerous polyps or small growths are found or if you have a family history or genetic risk for colorectal cancer, you may need to be screened more often.  Skin Cancer  Check your skin from head to toe regularly.  Monitor any moles. Be sure to tell your health care provider: ? About any new moles or changes in moles, especially if there is a change in a mole's shape or color. ? If you have a mole that is larger than the size of a pencil eraser.  If any of your family members has a history of skin cancer, especially at a young age, talk with your health care provider about genetic  screening.  Always use sunscreen. Apply sunscreen liberally and repeatedly throughout the day.  Whenever you are outside, protect yourself by wearing long sleeves, pants, a wide-brimmed hat, and sunglasses.  What should I know about osteoporosis? Osteoporosis is a condition in which bone destruction happens more quickly than new bone creation. After menopause, you may be at an increased risk for osteoporosis. To help prevent osteoporosis or the bone fractures that can happen because of osteoporosis, the following is recommended:  If you are 12-37 years old, get at least 1,000 mg of calcium and at least 600 mg of vitamin D per day.  If you are older than age 75 but younger than age 84, get at least 1,200 mg of calcium and at least 600 mg of vitamin  D per day.  If you are older than age 78, get at least 1,200 mg of calcium and at least 800 mg of vitamin D per day.  Smoking and excessive alcohol intake increase the risk of osteoporosis. Eat foods that are rich in calcium and vitamin D, and do weight-bearing exercises several times each week as directed by your health care provider. What should I know about how menopause affects my mental health? Depression may occur at any age, but it is more common as you become older. Common symptoms of depression include:  Low or sad mood.  Changes in sleep patterns.  Changes in appetite or eating patterns.  Feeling an overall lack of motivation or enjoyment of activities that you previously enjoyed.  Frequent crying spells.  Talk with your health care provider if you think that you are experiencing depression. What should I know about immunizations? It is important that you get and maintain your immunizations. These include:  Tetanus, diphtheria, and pertussis (Tdap) booster vaccine.  Influenza every year before the flu season begins.     Pneumonia vaccine.  Shingles vaccine.  Your health care provider may also recommend other  immunizations. This information is not intended to replace advice given to you by your health care provider. Make sure you discuss any questions you have with your health care provider. Document Released: 04/27/2005 Document Revised: 09/23/2015 Document Reviewed: 12/07/2014 Elsevier Interactive Patient Education  2018 [place], Adult A nosebleed is when blood comes out of the nose. Nosebleeds are common. Usually, they are not a sign of a serious condition. Nosebleeds can happen if a small blood vessel in your nose starts to bleed or if the lining of your nose (mucous membrane) cracks. They are commonly caused by: Allergies. Colds. Picking your nose. Blowing your nose too hard. An injury from sticking an object into your nose or getting hit in the nose. Dry or cold air.  Less common causes of nosebleeds include: Toxic fumes. Something abnormal in the nose or in the air-filled spaces in the bones of the face (sinuses). Growths in the nose, such as polyps. Medicines or conditions that cause blood to clot slowly. Certain illnesses or procedures that irritate or dry out the nasal passages.  Follow these instructions at home: When you have a nosebleed: Sit down and tilt your head slightly forward. Use a clean towel or tissue to pinch your nostrils under the bony part of your nose. After 10 minutes, let go of your nose and see if bleeding starts again. Do not release pressure before that time. If there is still bleeding, repeat the pinching and holding for 10 minutes until the bleeding stops. Do not place tissues or gauze in the nose to stop bleeding. Avoid lying down and avoid tilting your head backward. That may make blood collect in the throat and cause gagging or coughing. Use a nasal spray decongestant to help with a nosebleed as told by your health care provider. Do not use petroleum jelly or mineral oil in your nose. It can drip into your lungs. After a  nosebleed: Avoid blowing your nose or sniffing for a number of hours. Avoid straining, lifting, or bending at the waist for several days. You may resume other normal activities as you are able. Use saline spray or a humidifier as told by your health care provider. Aspirinand blood thinners make bleeding more likely. If you are prescribed these medicines and you suffer from nosebleeds: Ask your health  care provider if you should stop taking the medicines or if you should adjust the dose. Do not stop taking medicines that your health care provider has recommended unless told by your health care provider. If your nosebleed was caused by dry mucous membranes, use over-the-counter saline nasal spray or gel. This will keep the mucous membranes moist and allow them to heal. If you must use a lubricant: Choose one that is water-soluble. Use only as much as you need and use it only as often as needed. Do not lie down until several hours after you use it. Contact a health care provider if: You have a fever. You get nosebleeds often or more often than usual. You bruise very easily. You have a nosebleed from having something stuck in your nose. You have bleeding in your mouth. You vomit or cough up brown material. You have a nosebleed after you start a new medicine. Get help right away if: You have a nosebleed after a fall or a head injury. Your nosebleed does not go away after 20 minutes. You feel dizzy or weak. You have unusual bleeding from other parts of your body. You have unusual bruising on other parts of your body. You become sweaty. You vomit blood. This information is not intended to replace advice given to you by your health care provider. Make sure you discuss any questions you have with your health care provider. Document Released: 12/13/2004 Document Revised: 11/03/2015 Document Reviewed: 09/20/2015 Elsevier Interactive Patient Education  [employer].

## 2017-03-26 NOTE — Progress Notes (Signed)
Subjective:    Patient ID: Felicia Martin, female    DOB: Aug 15, 1932, 84 y.o.   MRN: [id number]  Chief Complaint  Patient presents with  . Medicare Wellness    HPI Patient is in today for f/u bp and nose bleeds.  She has not had another nose bleed since last visit.  Pt now admitting she had stopped her bp med so she is not taking 10 mg norvasc.  She is only taking 5 mg.  No other complaints.  Past Medical History:  Diagnosis Date  . Asthma   . GERD (gastroesophageal reflux disease)   . Hypercholesterolemia   . Hypertension   . Pulmonary fibrosis ([place])   . Sarcoidosis   . Surgical history of tubal ligation 1960s    Past Surgical History:  Procedure Laterality Date  . COLONOSCOPY  12/09/14  . ESOPHAGOGASTRODUODENOSCOPY  12/09/14  . LUNG SURGERY  1960   Right lung  . Maloney dilation  12/09/14  . TUBAL LIGATION  1960    Family History  Problem Relation Age of Onset  . Diabetes Sister        x's 3  . Alzheimer's disease Sister   . Liver cancer Mother   . Liver cancer Father   . Cancer Father   . Cancer Brother   . Diabetes Brother   . Hypertension Sister        x's 3  . Hyperlipidemia Unknown   . Breast cancer Neg Hx     Social History   Socioeconomic History  . Marital status: Single    Spouse name: Not on file  . Number of children: 4  . Years of education: Not on file  . Highest education level: Not on file  Social Needs  . Financial resource strain: Not on file  . Food insecurity - worry: Not on file  . Food insecurity - inability: Not on file  . Transportation needs - medical: Not on file  . Transportation needs - non-medical: Not on file  Occupational History  . Occupation: Chartered certified accountant  Tobacco Use  . Smoking status: Former Smoker    Packs/day: 0.50    Years: 40.00    Pack years: 20.00    Types: Cigarettes    Last attempt to quit: 05/25/1999    Years since quitting: 17.8  . Smokeless tobacco: Never Used  Substance and Sexual Activity  .  Alcohol use: No  . Drug use: No  . Sexual activity: No  Other Topics Concern  . Not on file  Social History Narrative  . Not on file    Outpatient Medications Prior to Visit  Medication Sig Dispense Refill  . albuterol (PROVENTIL HFA;VENTOLIN HFA) 108 (90 Base) MCG/ACT inhaler Inhale 2 puffs into the lungs every 6 (six) hours as needed. 1 Inhaler 1  . fluticasone (FLOVENT HFA) 110 MCG/ACT inhaler Inhale 2 puffs into the lungs 2 (two) times daily. 1 Inhaler 3  . amLODipine (NORVASC) 10 MG tablet Take 1 tablet (10 mg total) by mouth daily. 90 tablet 3  . meloxicam (MOBIC) 15 MG tablet TAKE 1 TABLET BY MOUTH EVERY DAY AS NEEDED FOR PAIN 30 tablet 2   No facility-administered medications prior to visit.     Allergies  Allergen Reactions  . Statins Other (See Comments)    Muscle aches and weakness  . Zetia [Ezetimibe] Other (See Comments)    Muscle pain  . Fenofibrate Rash    Review of Systems  Constitutional: Negative for chills,  fever and malaise/fatigue.  HENT: Negative for congestion and hearing loss.   Eyes: Negative for discharge.  Respiratory: Negative for cough, sputum production and shortness of breath.   Cardiovascular: Negative for chest pain, palpitations and leg swelling.  Gastrointestinal: Negative for abdominal pain, blood in stool, constipation, diarrhea, heartburn, nausea and vomiting.  Genitourinary: Negative for dysuria, frequency, hematuria and urgency.  Musculoskeletal: Negative for back pain, falls and myalgias.  Skin: Negative for rash.  Neurological: Negative for dizziness, sensory change, loss of consciousness, weakness and headaches.  Endo/Heme/Allergies: Negative for environmental allergies. Does not bruise/bleed easily.  Psychiatric/Behavioral: Negative for depression and suicidal ideas. The patient is not nervous/anxious and does not have insomnia.        Objective:    Physical Exam  Constitutional: She is oriented to person, place, and time. She  appears well-developed and well-nourished.  HENT:  Head: Normocephalic and atraumatic.  Eyes: Conjunctivae and EOM are normal.  Neck: Normal range of motion. Neck supple. No JVD present. Carotid bruit is not present. No thyromegaly present.  Cardiovascular: Normal rate and regular rhythm.  Murmur heard. Pulmonary/Chest: Effort normal and breath sounds normal. No respiratory distress. She has no wheezes. She has no rales. She exhibits no tenderness.  Musculoskeletal: She exhibits no edema.  Neurological: She is alert and oriented to person, place, and time.  Psychiatric: She has a normal mood and affect.  Nursing note and vitals reviewed.   BP (!) 118/58 (BP Location: Left Arm, Patient Position: Sitting, Cuff Size: Normal)   Pulse 82   Ht 5' (1.524 m)   Wt 207 lb 12.8 oz (94.3 kg)   LMP 03/17/2014   SpO2 96%   BMI 40.58 kg/m  Wt Readings from Last 3 Encounters:  03/26/17 207 lb 12.8 oz (94.3 kg)  03/15/17 209 lb (94.8 kg)  01/29/17 209 lb 12.8 oz (95.2 kg)     Lab Results  Component Value Date   WBC 4.4 12/16/2013   HGB 12.2 12/16/2013   HCT 36.7 12/16/2013   PLT 279.0 12/16/2013   GLUCOSE 110 (H) 01/30/2017   CHOL 317 (H) 01/30/2017   TRIG 112.0 01/30/2017   HDL 56.80 01/30/2017   LDLDIRECT 230.7 03/18/2013   LDLCALC 238 (H) 01/30/2017   ALT 7 01/30/2017   AST 15 01/30/2017   NA 139 01/30/2017   K 3.8 01/30/2017   CL 103 01/30/2017   CREATININE 0.75 01/30/2017   BUN 17 01/30/2017   CO2 28 01/30/2017   TSH 2.19 09/29/2009   HGBA1C 6.5 10/19/2015    Lab Results  Component Value Date   TSH 2.19 09/29/2009   Lab Results  Component Value Date   WBC 4.4 12/16/2013   HGB 12.2 12/16/2013   HCT 36.7 12/16/2013   MCV 92.0 12/16/2013   PLT 279.0 12/16/2013   Lab Results  Component Value Date   NA 139 01/30/2017   K 3.8 01/30/2017   CO2 28 01/30/2017   GLUCOSE 110 (H) 01/30/2017   BUN 17 01/30/2017   CREATININE 0.75 01/30/2017   BILITOT 0.5 01/30/2017    ALKPHOS 71 01/30/2017   AST 15 01/30/2017   ALT 7 01/30/2017   PROT 8.5 (H) 01/30/2017   ALBUMIN 4.1 01/30/2017   CALCIUM 9.8 01/30/2017   GFR 95.14 01/30/2017   Lab Results  Component Value Date   CHOL 317 (H) 01/30/2017   Lab Results  Component Value Date   HDL 56.80 01/30/2017   Lab Results  Component Value Date   LDLCALC  238 (H) 01/30/2017   Lab Results  Component Value Date   TRIG 112.0 01/30/2017   Lab Results  Component Value Date   CHOLHDL 6 01/30/2017   Lab Results  Component Value Date   HGBA1C 6.5 10/19/2015       Assessment & Plan:   Problem List Items Addressed This Visit      Unprioritized   Essential hypertension    Stable Pt was not taking her bp meds last visit-- -so she did not inc to 10 mg norvasc.  She just started taking her 5 mg again rto 3 months or sooner prn       Relevant Medications   amLODipine (NORVASC) 5 MG tablet   Other Relevant Orders   ECHOCARDIOGRAM COMPLETE    Other Visit Diagnoses    Encounter for Medicare annual wellness exam    -  Primary   Postmenopausal       Relevant Orders   DG Bone Density   DDD (degenerative disc disease), thoracolumbar       Relevant Medications   meloxicam (MOBIC) 15 MG tablet   Murmur, cardiac       Relevant Orders   ECHOCARDIOGRAM COMPLETE      I have discontinued Mekiyah T. Boehringer's amLODipine. I am also having her start on amLODipine. Additionally, I am having her maintain her albuterol, fluticasone, and meloxicam.  Meds ordered this encounter  Medications  . amLODipine (NORVASC) 5 MG tablet    Sig: Take 1 tablet (5 mg total) by mouth daily.    Dispense:  90 tablet    Refill:  3  . meloxicam (MOBIC) 15 MG tablet    Sig: TAKE 1 TABLET BY MOUTH EVERY DAY AS NEEDED FOR PAIN    Dispense:  30 tablet    Refill:  2    CMA served as scribe during this visit. History, Physical and Plan performed by medical provider. Documentation and orders reviewed and attested to.  Ann Held, DO

## 2017-04-15 ENCOUNTER — Other Ambulatory Visit (HOSPITAL_BASED_OUTPATIENT_CLINIC_OR_DEPARTMENT_OTHER): Payer: Medicare Other

## 2017-04-17 ENCOUNTER — Ambulatory Visit (HOSPITAL_BASED_OUTPATIENT_CLINIC_OR_DEPARTMENT_OTHER): Payer: Medicare Other

## 2017-04-17 ENCOUNTER — Ambulatory Visit (HOSPITAL_BASED_OUTPATIENT_CLINIC_OR_DEPARTMENT_OTHER)
Admission: RE | Admit: 2017-04-17 | Discharge: 2017-04-17 | Disposition: A | Discharge status: Home / Self Care | Payer: Medicare Other | Source: Ambulatory Visit | Attending: Family Medicine | Admitting: Family Medicine

## 2017-04-17 DIAGNOSIS — R011 Cardiac murmur, unspecified: Secondary | ICD-10-CM | POA: Insufficient documentation

## 2017-04-17 DIAGNOSIS — E785 Hyperlipidemia, unspecified: Secondary | ICD-10-CM | POA: Insufficient documentation

## 2017-04-17 DIAGNOSIS — I10 Essential (primary) hypertension: Secondary | ICD-10-CM | POA: Insufficient documentation

## 2017-04-17 LAB — ECHOCARDIOGRAM COMPLETE
AOASC: 35 cm
AOPV: 0.95 m/s
AV Area VTI: 3.63 cm2
AV Mean grad: 3 mmHg
AV Peak grad: 5 mmHg
AV pk vel: 111 cm/s
AVAREAMEANV: 3.43 cm2
AVAREAMEANVIN: 1.8 cm2/m2
AVAREAVTIIND: 1.6 cm2/m2
AVCELMEANRAT: 0.9
CHL CUP AV PEAK INDEX: 1.91
CHL CUP AV VALUE AREA INDEX: 1.6
CHL CUP AV VEL: 3.04
CHL CUP DOP CALC LVOT VTI: 20.1 cm
DOP CAL AO MEAN VELOCITY: 73 cm/s
E/e' ratio: 7.76
EWDT: 190 ms
FS: 30 % (ref 28–44)
IV/PV OW: 1.14
LA ID, A-P, ES: 35 mm
LA diam end sys: 35 mm
LA diam index: 1.84 cm/m2
LA vol A4C: 34.8 ml
LA vol: 37.1 mL
LAVOLIN: 19.5 mL/m2
LDCA: 3.8 cm2
LV E/e' medial: 7.76
LV E/e'average: 7.76
LV e' LATERAL: 7.94 cm/s
LVOT SV: 76 mL
LVOTD: 22 mm
LVOTPV: 106 cm/s
LVOTVTI: 0.8 cm
MV Dec: 190
MV pk E vel: 61.6 m/s
MVPKAVEL: 103 m/s
PW: 11.3 mm — AB (ref 0.6–1.1)
RV LATERAL S' VELOCITY: 19 cm/s
TAPSE: 18.2 mm
TDI e' lateral: 7.94
TDI e' medial: 6.85
VTI: 25.1 cm
Valve area: 3.04 cm2

## 2017-04-17 NOTE — Progress Notes (Signed)
Echocardiogram 2D Echocardiogram has been performed.  Felicia Martin 04/17/2017, 3:55 PM

## 2017-07-24 ENCOUNTER — Other Ambulatory Visit (INDEPENDENT_AMBULATORY_CARE_PROVIDER_SITE_OTHER): Payer: Medicare Other

## 2017-07-24 DIAGNOSIS — E785 Hyperlipidemia, unspecified: Secondary | ICD-10-CM

## 2017-07-24 DIAGNOSIS — I10 Essential (primary) hypertension: Secondary | ICD-10-CM

## 2017-07-24 LAB — LIPID PANEL
CHOL/HDL RATIO: 5
Cholesterol: 315 mg/dL — ABNORMAL HIGH (ref 0–200)
HDL: 63.6 mg/dL (ref >39.00)
LDL CALC: 222 mg/dL — AB (ref 0–99)
NONHDL: 251.11
Triglycerides: 147 mg/dL (ref 0.0–149.0)
VLDL: 29.4 mg/dL (ref 0.0–40.0)

## 2017-07-24 LAB — COMPREHENSIVE METABOLIC PANEL
ALT: 9 U/L (ref 0–35)
AST: 15 U/L (ref 0–37)
Albumin: 4.1 g/dL (ref 3.5–5.2)
Alkaline Phosphatase: 71 U/L (ref 39–117)
BUN: 18 mg/dL (ref 6–23)
CALCIUM: 9.7 mg/dL (ref 8.4–10.5)
CHLORIDE: 102 meq/L (ref 96–112)
CO2: 28 meq/L (ref 19–32)
Creatinine, Ser: 0.75 mg/dL (ref 0.40–1.20)
GFR: 95.03 mL/min (ref >60.00)
Glucose, Bld: 116 mg/dL — ABNORMAL HIGH (ref 70–99)
Potassium: 3.8 mEq/L (ref 3.5–5.1)
Sodium: 138 mEq/L (ref 135–145)
Total Bilirubin: 0.5 mg/dL (ref 0.2–1.2)
Total Protein: 8.3 g/dL (ref 6.0–8.3)

## 2017-08-02 ENCOUNTER — Telehealth: Payer: Self-pay | Admitting: Family Medicine

## 2017-08-02 NOTE — Telephone Encounter (Signed)
Copied from [place] [id number]. Topic: Quick Communication - See Telephone Encounter >> Aug 02, 2017  9:29 AM Boyd Kerbs wrote: CRM for notification. See Telephone encounter for: 08/02/17.  Pt. Calling about lab results.  Has not heard anything yet

## 2017-08-05 ENCOUNTER — Telehealth: Payer: Self-pay | Admitting: Family Medicine

## 2017-08-05 MED ORDER — SIMVASTATIN 20 MG PO TABS: 20.0000 mg | ORAL_TABLET | Freq: Every day | ORAL | 2 refills | Status: DC

## 2017-08-05 NOTE — Telephone Encounter (Signed)
Copied from [place] [id number]. Topic: Quick Communication - See Telephone Encounter >> Aug 02, 2017  9:29 AM Boyd Kerbs wrote: CRM for notification. See Telephone encounter for: 08/02/17.  Pt. Calling about lab results.  Has not heard anything yet >> Aug 05, 2017 11:45 AM Cleaster Corin, NT wrote: Pt. Calling back to receive lab resuts

## 2017-08-05 NOTE — Telephone Encounter (Signed)
Author phoned pt. Regarding lab results from 5/8 per pt. request, and author reviewed out of range values and told pt. That Dr. Etter Sjogren added on zocor 20mg  po HS. Pt. stated she had received a call from her pharmacy regarding med ready for pick-up but did not know what it was for. Chief Strategy Officer provided medication education and relayed Dr. Nonda Lou request to recheck labs in 3 months, and pt. Stated she would make an appointment at another time.

## 2017-08-05 NOTE — Addendum Note (Signed)
Addended by: Bartholome Bill on: 08/05/2017 10:26 AM   Modules accepted: Orders

## 2017-08-06 NOTE — Telephone Encounter (Signed)
According to chart--- shaneka left a message on pt vm because she did not answer

## 2017-08-09 ENCOUNTER — Emergency Department (HOSPITAL_BASED_OUTPATIENT_CLINIC_OR_DEPARTMENT_OTHER): Payer: Medicare Other

## 2017-08-09 ENCOUNTER — Ambulatory Visit: Payer: Medicare Other | Admitting: Medical

## 2017-08-09 ENCOUNTER — Ambulatory Visit: Payer: Medicare Other | Admitting: Physician Assistant

## 2017-08-09 ENCOUNTER — Emergency Department (HOSPITAL_BASED_OUTPATIENT_CLINIC_OR_DEPARTMENT_OTHER)
Admission: EM | Admit: 2017-08-09 | Discharge: 2017-08-09 | Disposition: A | Discharge status: Home / Self Care | Payer: Medicare Other | Attending: Emergency Medicine | Admitting: Emergency Medicine

## 2017-08-09 ENCOUNTER — Encounter (HOSPITAL_BASED_OUTPATIENT_CLINIC_OR_DEPARTMENT_OTHER): Payer: Self-pay | Admitting: Emergency Medicine

## 2017-08-09 ENCOUNTER — Other Ambulatory Visit: Payer: Self-pay

## 2017-08-09 DIAGNOSIS — Z87891 Personal history of nicotine dependence: Secondary | ICD-10-CM | POA: Insufficient documentation

## 2017-08-09 DIAGNOSIS — J45909 Unspecified asthma, uncomplicated: Secondary | ICD-10-CM | POA: Insufficient documentation

## 2017-08-09 DIAGNOSIS — Z79899 Other long term (current) drug therapy: Secondary | ICD-10-CM | POA: Diagnosis not present

## 2017-08-09 DIAGNOSIS — Y93H2 Activity, gardening and landscaping: Secondary | ICD-10-CM | POA: Insufficient documentation

## 2017-08-09 DIAGNOSIS — Y92007 Garden or yard of unspecified non-institutional (private) residence as the place of occurrence of the external cause: Secondary | ICD-10-CM | POA: Diagnosis not present

## 2017-08-09 DIAGNOSIS — R0989 Other specified symptoms and signs involving the circulatory and respiratory systems: Secondary | ICD-10-CM | POA: Diagnosis not present

## 2017-08-09 DIAGNOSIS — X509XXA Other and unspecified overexertion or strenuous movements or postures, initial encounter: Secondary | ICD-10-CM | POA: Diagnosis not present

## 2017-08-09 DIAGNOSIS — R05 Cough: Secondary | ICD-10-CM | POA: Insufficient documentation

## 2017-08-09 DIAGNOSIS — R0602 Shortness of breath: Secondary | ICD-10-CM | POA: Diagnosis not present

## 2017-08-09 DIAGNOSIS — N390 Urinary tract infection, site not specified: Secondary | ICD-10-CM | POA: Diagnosis not present

## 2017-08-09 DIAGNOSIS — Y999 Unspecified external cause status: Secondary | ICD-10-CM | POA: Insufficient documentation

## 2017-08-09 DIAGNOSIS — S39012A Strain of muscle, fascia and tendon of lower back, initial encounter: Secondary | ICD-10-CM | POA: Diagnosis not present

## 2017-08-09 DIAGNOSIS — S3992XA Unspecified injury of lower back, initial encounter: Secondary | ICD-10-CM | POA: Diagnosis present

## 2017-08-09 DIAGNOSIS — I10 Essential (primary) hypertension: Secondary | ICD-10-CM | POA: Insufficient documentation

## 2017-08-09 DIAGNOSIS — M545 Low back pain: Secondary | ICD-10-CM | POA: Diagnosis not present

## 2017-08-09 LAB — URINALYSIS, MICROSCOPIC (REFLEX): RBC / HPF: NONE SEEN RBC/hpf (ref 0–5)

## 2017-08-09 LAB — URINALYSIS, ROUTINE W REFLEX MICROSCOPIC
Bilirubin Urine: NEGATIVE
Glucose, UA: NEGATIVE mg/dL
Hgb urine dipstick: NEGATIVE
Ketones, ur: NEGATIVE mg/dL
NITRITE: NEGATIVE
Protein, ur: NEGATIVE mg/dL
SPECIFIC GRAVITY, URINE: 1.025 (ref 1.005–1.030)
pH: 6 (ref 5.0–8.0)

## 2017-08-09 MED ORDER — CEPHALEXIN 250 MG PO CAPS
500.0000 mg | ORAL_CAPSULE | Freq: Once | ORAL | Status: AC
  Administered 2017-08-09: 500 mg via ORAL
  Filled 2017-08-09: qty 2

## 2017-08-09 MED ORDER — ACETAMINOPHEN 325 MG PO TABS
650.0000 mg | ORAL_TABLET | Freq: Once | ORAL | Status: DC
  Filled 2017-08-09: qty 2

## 2017-08-09 MED ORDER — METHOCARBAMOL 500 MG PO TABS
500.0000 mg | ORAL_TABLET | Freq: Once | ORAL | Status: DC
  Filled 2017-08-09: qty 1

## 2017-08-09 MED ORDER — PREDNISONE 10 MG PO TABS: 40.0000 mg | ORAL_TABLET | Freq: Every day | ORAL | 0 refills | Status: DC

## 2017-08-09 MED ORDER — CEPHALEXIN 500 MG PO CAPS: 500.0000 mg | ORAL_CAPSULE | Freq: Two times a day (BID) | ORAL | 0 refills | Status: DC

## 2017-08-09 MED ORDER — METHOCARBAMOL 500 MG PO TABS: 500.0000 mg | ORAL_TABLET | Freq: Two times a day (BID) | ORAL | 0 refills | Status: DC | PRN

## 2017-08-09 MED ORDER — PREDNISONE 20 MG PO TABS
40.0000 mg | ORAL_TABLET | Freq: Once | ORAL | Status: AC
  Administered 2017-08-09: 40 mg via ORAL
  Filled 2017-08-09: qty 2

## 2017-08-09 MED FILL — CEPHALEXIN 500 MG CAPSULE: 500 | 10 days supply | Qty: 20 | Fill #0

## 2017-08-09 MED FILL — predniSONE 10 MG TABS: 10 | 3 days supply | Qty: 12 | Fill #0

## 2017-08-09 MED FILL — tiZANidine HCL 4 MG TABS: 4 | 5 days supply | Qty: 10 | Fill #0

## 2017-08-09 NOTE — ED Notes (Signed)
Patient transported to X-ray 
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
## 2017-08-09 NOTE — ED Triage Notes (Signed)
Bil low back pain x2 weeks since doing yard work.  Got worse yesterday. Daughter requests that pts breathing be checked as well.  Sts she has been SOB some this week.

## 2017-08-10 LAB — URINE CULTURE: Culture: >=100000 — AB

## 2017-08-11 ENCOUNTER — Telehealth: Payer: Self-pay

## 2017-08-11 NOTE — Telephone Encounter (Signed)
Post ED Visit - Positive Culture Follow-up  Culture report reviewed by antimicrobial stewardship pharmacist:  []  Elenor Quinones, Pharm.D. []  Heide Guile, Pharm.D., BCPS AQ-ID []  Parks Neptune, Pharm.D., BCPS []  Alycia Rossetti, Pharm.D., BCPS []  [place], Pharm.D., BCPS, AAHIVP []  Legrand Como, Pharm.D., BCPS, AAHIVP []  Salome Arnt, PharmD, BCPS []  Wynell Balloon, PharmD []  Vincenza Hews, PharmD, BCPS [facility] Pharm D Positive urine culture Treated with Cephalexin, organism sensitive to the same and no further patient follow-up is required at this time.  Genia Del 08/11/2017, 11:05 AM

## 2017-08-22 DIAGNOSIS — Z801 Family history of malignant neoplasm of trachea, bronchus and lung: Secondary | ICD-10-CM | POA: Diagnosis not present

## 2017-10-03 ENCOUNTER — Encounter: Payer: Self-pay | Admitting: Gastroenterology

## 2017-10-23 ENCOUNTER — Ambulatory Visit (INDEPENDENT_AMBULATORY_CARE_PROVIDER_SITE_OTHER): Payer: Medicare Other | Admitting: Family

## 2017-10-23 ENCOUNTER — Encounter: Payer: Self-pay | Admitting: Family

## 2017-10-23 VITALS — BP 148/78 | HR 65 | Temp 97.9°F | Resp 18 | Ht 60.0 in | Wt 210.0 lb

## 2017-10-23 DIAGNOSIS — R350 Frequency of micturition: Secondary | ICD-10-CM | POA: Diagnosis not present

## 2017-10-23 DIAGNOSIS — N3000 Acute cystitis without hematuria: Secondary | ICD-10-CM

## 2017-10-23 DIAGNOSIS — I10 Essential (primary) hypertension: Secondary | ICD-10-CM

## 2017-10-23 LAB — POC URINALSYSI DIPSTICK (AUTOMATED)
BILIRUBIN UA: NEGATIVE
GLUCOSE UA: NEGATIVE
KETONES UA: NEGATIVE
Nitrite, UA: NEGATIVE
PH UA: 5.5 (ref 5.0–8.0)
Protein, UA: NEGATIVE
RBC UA: NEGATIVE
Spec Grav, UA: >=1.03 — AB (ref 1.010–1.025)
Urobilinogen, UA: 0.2 E.U./dL

## 2017-10-23 MED ORDER — CEPHALEXIN 500 MG PO CAPS: 500.0000 mg | ORAL_CAPSULE | Freq: Two times a day (BID) | ORAL | 0 refills | Status: DC

## 2017-10-23 NOTE — Patient Instructions (Signed)
Please begin keflex for urinary tract infection. You may use tylenol as needed for pain.  Call if new/worsening symptoms or if symptoms do not improve in 3-4 days.

## 2017-10-23 NOTE — Progress Notes (Signed)
Subjective:    Patient ID: Felicia Martin, female    DOB: 1932-03-22, 84 y.o.   MRN: [id number]  HPI  Felicia Martin is an 84 yr old female who presents today with chief complaint of low back pain. Pain has been present for 2 days. Has associated urinary frequency.  Back pain is worse with walking.  Reports + frequency.  Denies dysuria or fever.   HTN- maintained on amlodipine 5mg  once daily,  BP Readings from Last 3 Encounters:  10/23/17 (!) 176/81  08/09/17 137/68  03/26/17 (!) 118/58   Lab Results  Component Value Date   CREATININE 0.75 07/24/2017    Review of Systems    see HPI  Past Medical History:  Diagnosis Date  . Asthma   . GERD (gastroesophageal reflux disease)   . Hypercholesterolemia   . Hypertension   . Pulmonary fibrosis ([place])   . Sarcoidosis   . Surgical history of tubal ligation 1960s     Social History   Socioeconomic History  . Marital status: Single    Spouse name: Not on file  . Number of children: 4  . Years of education: Not on file  . Highest education level: Not on file  Occupational History  . Occupation: Chartered certified accountant  Social Needs  . Financial resource strain: Not on file  . Food insecurity:    Worry: Not on file    Inability: Not on file  . Transportation needs:    Medical: Not on file    Non-medical: Not on file  Tobacco Use  . Smoking status: Former Smoker    Packs/day: 0.50    Years: 40.00    Pack years: 20.00    Types: Cigarettes    Last attempt to quit: 05/25/1999    Years since quitting: 18.4  . Smokeless tobacco: Never Used  Substance and Sexual Activity  . Alcohol use: No  . Drug use: No  . Sexual activity: Never  Lifestyle  . Physical activity:    Days per week: Not on file    Minutes per session: Not on file  . Stress: Not on file  Relationships  . Social connections:    Talks on phone: Not on file    Gets together: Not on file    Attends religious service: Not on file    Active member of club or  organization: Not on file    Attends meetings of clubs or organizations: Not on file    Relationship status: Not on file  . Intimate partner violence:    Fear of current or ex partner: Not on file    Emotionally abused: Not on file    Physically abused: Not on file    Forced sexual activity: Not on file  Other Topics Concern  . Not on file  Social History Narrative  . Not on file    Past Surgical History:  Procedure Laterality Date  . COLONOSCOPY  12/09/14  . ESOPHAGOGASTRODUODENOSCOPY  12/09/14  . LUNG SURGERY  1960   Right lung  . Maloney dilation  12/09/14  . TUBAL LIGATION  1960    Family History  Problem Relation Age of Onset  . Diabetes Sister        x's 3  . Alzheimer's disease Sister   . Liver cancer Mother   . Liver cancer Father   . Cancer Father   . Cancer Brother   . Diabetes Brother   . Hypertension Sister  x's 3  . Hyperlipidemia Unknown   . Breast cancer Neg Hx     Allergies  Allergen Reactions  . Statins Other (See Comments)    Muscle aches and weakness  . Zetia [Ezetimibe] Other (See Comments)    Muscle pain  . Fenofibrate Rash    Current Outpatient Medications on File Prior to Visit  Medication Sig Dispense Refill  . albuterol (PROVENTIL HFA;VENTOLIN HFA) 108 (90 Base) MCG/ACT inhaler Inhale 2 puffs into the lungs every 6 (six) hours as needed. 1 Inhaler 1  . amLODipine (NORVASC) 5 MG tablet Take 1 tablet (5 mg total) by mouth daily. 90 tablet 3  . cephALEXin (KEFLEX) 500 MG capsule Take 1 capsule (500 mg total) by mouth 2 (two) times daily. (Patient not taking: Reported on 10/23/2017) 20 capsule 0  . fluticasone (FLOVENT HFA) 110 MCG/ACT inhaler Inhale 2 puffs into the lungs 2 (two) times daily. (Patient not taking: Reported on 10/23/2017) 1 Inhaler 3  . meloxicam (MOBIC) 15 MG tablet TAKE 1 TABLET BY MOUTH EVERY DAY AS NEEDED FOR PAIN (Patient not taking: Reported on 10/23/2017) 30 tablet 2  . methocarbamol (ROBAXIN) 500 MG tablet Take 1 tablet  (500 mg total) by mouth 2 (two) times daily as needed for muscle spasms. (Patient not taking: Reported on 10/23/2017) 10 tablet 0  . predniSONE (DELTASONE) 10 MG tablet Take 4 tablets (40 mg total) by mouth daily. (Patient not taking: Reported on 10/23/2017) 12 tablet 0  . simvastatin (ZOCOR) 20 MG tablet Take 1 tablet (20 mg total) by mouth at bedtime. (Patient not taking: Reported on 10/23/2017) 30 tablet 2   No current facility-administered medications on file prior to visit.     BP (!) 176/81 (BP Location: Right Arm, Cuff Size: Large)   Pulse 65   Temp 97.9 F (36.6 C) (Oral)   Resp 18   Ht 5' (1.524 m)   Wt 210 lb (95.3 kg)   LMP 03/17/2014   SpO2 100%   BMI 41.01 kg/m    Objective:   Physical Exam  Constitutional: She is oriented to person, place, and time. She appears well-developed and well-nourished.  Cardiovascular: Normal rate, regular rhythm and normal heart sounds.  No murmur heard. Pulmonary/Chest: Effort normal and breath sounds normal. No respiratory distress. She has no wheezes.  Abdominal: Soft. She exhibits no distension. There is no tenderness.  Negative CVA tenderness bilaterally  Neurological: She is alert and oriented to person, place, and time.  Skin: Skin is warm and dry.  Psychiatric: She has a normal mood and affect. Her behavior is normal. Judgment and thought content normal.          Assessment & Plan:  UTI-point-of-care urinalysis notes trace leukocytes.  Will send urine for culture and plan to initiate Keflex 500 mg.  She is advised to call if new or worsening symptoms or if symptoms fail to improve in the next few days.  We discussed use of Tylenol as needed for pain.  Hypertension-initial blood pressure was elevated however follow blood pressure looks better.  I have advised for her to follow-up with her PCP in the next few months for blood pressure repeat. BP Readings from Last 3 Encounters:  10/23/17 (!) 148/78  08/09/17 137/68  03/26/17 (!)  118/58

## 2017-10-24 LAB — URINE CULTURE
MICRO NUMBER:: 90934411
SPECIMEN QUALITY:: ADEQUATE

## 2017-10-25 ENCOUNTER — Telehealth: Payer: Self-pay | Admitting: *Deleted

## 2017-10-25 NOTE — Telephone Encounter (Signed)
Urine confirmed infection. Antibiotic she was given should cure this. How is she feeling?

## 2017-10-25 NOTE — Telephone Encounter (Signed)
Patient saw you on 10/23/17

## 2017-10-25 NOTE — Telephone Encounter (Signed)
Copied from [place] [id number]. Topic: Quick Communication - Lab Results >> Oct 25, 2017  7:42 AM Lennox Solders wrote: Pt is calling and would like urine result

## 2017-10-25 NOTE — Telephone Encounter (Signed)
Talked to patient about results and advised to finish antibiotics.

## 2017-11-01 ENCOUNTER — Other Ambulatory Visit: Payer: Self-pay | Admitting: Family Medicine

## 2017-11-01 DIAGNOSIS — Z1231 Encounter for screening mammogram for malignant neoplasm of breast: Secondary | ICD-10-CM

## 2017-11-07 ENCOUNTER — Ambulatory Visit
Admission: RE | Admit: 2017-11-07 | Discharge: 2017-11-07 | Disposition: A | Discharge status: Home / Self Care | Payer: Medicare Other | Source: Ambulatory Visit | Attending: Family Medicine | Admitting: Family Medicine

## 2017-11-07 DIAGNOSIS — Z1231 Encounter for screening mammogram for malignant neoplasm of breast: Secondary | ICD-10-CM | POA: Diagnosis not present

## 2017-11-11 ENCOUNTER — Ambulatory Visit (INDEPENDENT_AMBULATORY_CARE_PROVIDER_SITE_OTHER): Payer: Medicare Other | Admitting: Family Medicine

## 2017-11-11 ENCOUNTER — Encounter: Payer: Self-pay | Admitting: Family Medicine

## 2017-11-11 VITALS — BP 148/45 | HR 62 | Temp 98.3°F | Resp 16 | Ht 60.0 in | Wt 210.8 lb

## 2017-11-11 DIAGNOSIS — Z87440 Personal history of urinary (tract) infections: Secondary | ICD-10-CM

## 2017-11-11 DIAGNOSIS — Z0000 Encounter for general adult medical examination without abnormal findings: Secondary | ICD-10-CM

## 2017-11-11 DIAGNOSIS — I10 Essential (primary) hypertension: Secondary | ICD-10-CM | POA: Diagnosis not present

## 2017-11-11 DIAGNOSIS — E785 Hyperlipidemia, unspecified: Secondary | ICD-10-CM | POA: Diagnosis not present

## 2017-11-11 LAB — COMPREHENSIVE METABOLIC PANEL
ALK PHOS: 71 U/L (ref 39–117)
ALT: 9 U/L (ref 0–35)
AST: 14 U/L (ref 0–37)
Albumin: 4.2 g/dL (ref 3.5–5.2)
BILIRUBIN TOTAL: 0.5 mg/dL (ref 0.2–1.2)
BUN: 23 mg/dL (ref 6–23)
CALCIUM: 9.9 mg/dL (ref 8.4–10.5)
CO2: 27 meq/L (ref 19–32)
CREATININE: 0.87 mg/dL (ref 0.40–1.20)
Chloride: 104 mEq/L (ref 96–112)
GFR: 80.01 mL/min (ref >60.00)
Glucose, Bld: 119 mg/dL — ABNORMAL HIGH (ref 70–99)
Potassium: 4.2 mEq/L (ref 3.5–5.1)
Sodium: 139 mEq/L (ref 135–145)
TOTAL PROTEIN: 7.9 g/dL (ref 6.0–8.3)

## 2017-11-11 LAB — LIPID PANEL
CHOL/HDL RATIO: 4
CHOLESTEROL: 225 mg/dL — AB (ref 0–200)
HDL: 55.5 mg/dL (ref >39.00)
LDL Cholesterol: 150 mg/dL — ABNORMAL HIGH (ref 0–99)
NonHDL: 169.16
Triglycerides: 94 mg/dL (ref 0.0–149.0)
VLDL: 18.8 mg/dL (ref 0.0–40.0)

## 2017-11-11 LAB — POC URINALSYSI DIPSTICK (AUTOMATED)
BILIRUBIN UA: NEGATIVE
Blood, UA: NEGATIVE
GLUCOSE UA: NEGATIVE
Ketones, UA: NEGATIVE
LEUKOCYTES UA: NEGATIVE
NITRITE UA: NEGATIVE
PH UA: 6 (ref 5.0–8.0)
Protein, UA: NEGATIVE
Spec Grav, UA: >=1.03 — AB (ref 1.010–1.025)
Urobilinogen, UA: 0.2 E.U./dL

## 2017-11-11 MED ORDER — AMLODIPINE BESYLATE 5 MG PO TABS: 5.0000 mg | ORAL_TABLET | Freq: Every day | ORAL | 3 refills | Status: DC

## 2017-11-11 MED ORDER — PITAVASTATIN CALCIUM 2 MG PO TABS: ORAL_TABLET | ORAL | 2 refills | Status: DC

## 2017-11-11 NOTE — Assessment & Plan Note (Signed)
Well controlled, no changes to meds. Encouraged heart healthy diet such as the DASH diet and exercise as tolerated.  °

## 2017-11-11 NOTE — Assessment & Plan Note (Addendum)
Encouraged heart healthy diet, increase exercise, avoid trans fats, consider a krill oil cap daily  Pt not taking statin regularly due to weakness Change to livalo

## 2017-11-11 NOTE — Progress Notes (Signed)
Patient ID: Felicia Martin, female    DOB: 1932-05-06  Age: 84 y.o. MRN: [id number]    Subjective:  Subjective  HPI Felicia Martin presents for f/u cholesterol and bp.  She is not taking her statin regularly due to muscle weakness.    Review of Systems  Constitutional: Negative for chills and fever.  HENT: Negative for congestion and hearing loss.   Eyes: Negative for discharge.  Respiratory: Negative for cough and shortness of breath.   Cardiovascular: Negative for chest pain, palpitations and leg swelling.  Gastrointestinal: Negative for abdominal pain, blood in stool, constipation, diarrhea, nausea and vomiting.  Genitourinary: Negative for dysuria, frequency, hematuria and urgency.  Musculoskeletal: Negative for back pain and myalgias.  Skin: Negative for rash.  Allergic/Immunologic: Negative for environmental allergies.  Neurological: Negative for dizziness, weakness and headaches.  Hematological: Does not bruise/bleed easily.  Psychiatric/Behavioral: Negative for suicidal ideas. The patient is not nervous/anxious.     History Past Medical History:  Diagnosis Date  . Asthma   . GERD (gastroesophageal reflux disease)   . Hypercholesterolemia   . Hypertension   . Pulmonary fibrosis ([place])   . Sarcoidosis   . Surgical history of tubal ligation 1960s    She has a past surgical history that includes Lung surgery (1960); Tubal ligation (1960); Maloney dilation (12/09/14); Colonoscopy (12/09/14); and Esophagogastroduodenoscopy (12/09/14).   Her family history includes Alzheimer's disease in her sister; Cancer in her brother and father; Diabetes in her brother and sister; Hyperlipidemia in her unknown relative; Hypertension in her sister; Liver cancer in her father and mother.She reports that she quit smoking about 18 years ago. Her smoking use included cigarettes. She has a 20.00 pack-year smoking history. She has never used smokeless tobacco. She reports that she does not  drink alcohol or use drugs.  Current Outpatient Medications on File Prior to Visit  Medication Sig Dispense Refill  . albuterol (PROVENTIL HFA;VENTOLIN HFA) 108 (90 Base) MCG/ACT inhaler Inhale 2 puffs into the lungs every 6 (six) hours as needed. 1 Inhaler 1  . fluticasone (FLOVENT HFA) 110 MCG/ACT inhaler Inhale 2 puffs into the lungs 2 (two) times daily. 1 Inhaler 3  . meloxicam (MOBIC) 15 MG tablet TAKE 1 TABLET BY MOUTH EVERY DAY AS NEEDED FOR PAIN 30 tablet 2  . methocarbamol (ROBAXIN) 500 MG tablet Take 1 tablet (500 mg total) by mouth 2 (two) times daily as needed for muscle spasms. 10 tablet 0   No current facility-administered medications on file prior to visit.      Objective:  Objective  Physical Exam  Constitutional: She is oriented to person, place, and time. She appears well-developed and well-nourished.  HENT:  Head: Normocephalic and atraumatic.  Eyes: Conjunctivae and EOM are normal.  Neck: Normal range of motion. Neck supple. No JVD present. Carotid bruit is not present. No thyromegaly present.  Cardiovascular: Normal rate, regular rhythm and normal heart sounds.  No murmur heard. Pulmonary/Chest: Effort normal and breath sounds normal. No respiratory distress. She has no wheezes. She has no rales. She exhibits no tenderness.  Musculoskeletal: She exhibits edema.  Neurological: She is alert and oriented to person, place, and time.  Psychiatric: She has a normal mood and affect.  Nursing note and vitals reviewed.  BP (!) 148/45 (BP Location: Left Arm)   Pulse 62   Temp 98.3 F (36.8 C) (Oral)   Resp 16   Ht 5' (1.524 m)   Wt 210 lb 12.8 oz (95.6 kg)   LMP  03/17/2014   SpO2 95%   BMI 41.17 kg/m  Wt Readings from Last 3 Encounters:  11/11/17 210 lb 12.8 oz (95.6 kg)  10/23/17 210 lb (95.3 kg)  08/09/17 202 lb (91.6 kg)     Lab Results  Component Value Date   WBC 4.4 12/16/2013   HGB 12.2 12/16/2013   HCT 36.7 12/16/2013   PLT 279.0 12/16/2013    GLUCOSE 116 (H) 07/24/2017   CHOL 315 (H) 07/24/2017   TRIG 147.0 07/24/2017   HDL 63.60 07/24/2017   LDLDIRECT 230.7 03/18/2013   LDLCALC 222 (H) 07/24/2017   ALT 9 07/24/2017   AST 15 07/24/2017   NA 138 07/24/2017   K 3.8 07/24/2017   CL 102 07/24/2017   CREATININE 0.75 07/24/2017   BUN 18 07/24/2017   CO2 28 07/24/2017   TSH 2.19 09/29/2009   HGBA1C 6.5 10/19/2015    Mm 3d Screen Breast Bilateral  Result Date: 11/07/2017 CLINICAL DATA:  Screening. EXAM: DIGITAL SCREENING BILATERAL MAMMOGRAM WITH TOMO AND CAD COMPARISON:  Previous exam(s). ACR Breast Density Category b: There are scattered areas of fibroglandular density. FINDINGS: There are no findings suspicious for malignancy. Images were processed with CAD. IMPRESSION: No mammographic evidence of malignancy. A result letter of this screening mammogram will be mailed directly to the patient. RECOMMENDATION: Screening mammogram in one year. (Code:SM-B-01Y) BI-RADS CATEGORY  1: Negative. Electronically Signed   By: Ammie Ferrier M.D.   On: 11/07/2017 15:00     Assessment & Plan:  Plan  I have discontinued Felicia Martin's simvastatin, predniSONE, and cephALEXin. I am also having her start on Pitavastatin Calcium. Additionally, I am having her maintain her albuterol, fluticasone, meloxicam, methocarbamol, and amLODipine.  Meds ordered this encounter  Medications  . Pitavastatin Calcium (LIVALO) 2 MG TABS    Sig: 1 po qhs    Dispense:  30 tablet    Refill:  2  . amLODipine (NORVASC) 5 MG tablet    Sig: Take 1 tablet (5 mg total) by mouth daily.    Dispense:  90 tablet    Refill:  3    Problem List Items Addressed This Visit      Unprioritized   Essential hypertension    Well controlled, no changes to meds. Encouraged heart healthy diet such as the DASH diet and exercise as tolerated.        Relevant Medications   Pitavastatin Calcium (LIVALO) 2 MG TABS   amLODipine (NORVASC) 5 MG tablet   Other Relevant  Orders   Lipid panel   Comprehensive metabolic panel   Hyperlipidemia LDL goal <100    Encouraged heart healthy diet, increase exercise, avoid trans fats, consider a krill oil cap daily  Pt not taking statin regularly due to weakness Change to livalo      Relevant Medications   Pitavastatin Calcium (LIVALO) 2 MG TABS   amLODipine (NORVASC) 5 MG tablet   Other Relevant Orders   Lipid panel   Comprehensive metabolic panel   Routine general medical examination at a health care facility    Pt refusing all vaccines        Other Visit Diagnoses    History of UTI    -  Primary   Relevant Orders   POCT Urinalysis Dipstick (Automated) (Completed)      Follow-up: Return in about 6 months (around 05/14/2018) for annual exam, fasting.  Ann Held, DO

## 2017-11-11 NOTE — Assessment & Plan Note (Signed)
Pt refusing all vaccines

## 2017-11-11 NOTE — Patient Instructions (Signed)
Edema Edema is an abnormal buildup of fluids in your bodytissues. Edema is somewhatdependent on gravity to pull the fluid to the lowest place in your body. That makes the condition more common in the legs and thighs (lower extremities). Painless swelling of the feet and ankles is common and becomes more likely as you get older. It is also common in looser tissues, like around your eyes. When the affected area is squeezed, the fluid may move out of that spot and leave a dent for a few moments. This dent is called pitting. What are the causes? There are many possible causes of edema. Eating too much salt and being on your feet or sitting for a long time can cause edema in your legs and ankles. Hot weather may make edema worse. Common medical causes of edema include:  Heart failure.  Liver disease.  Kidney disease.  Weak blood vessels in your legs.  Cancer.  An injury.  Pregnancy.  Some medications.  Obesity.  What are the signs or symptoms? Edema is usually painless.Your skin may look swollen or shiny. How is this diagnosed? Your health care provider may be able to diagnose edema by asking about your medical history and doing a physical exam. You may need to have tests such as X-rays, an electrocardiogram, or blood tests to check for medical conditions that may cause edema. How is this treated? Edema treatment depends on the cause. If you have heart, liver, or kidney disease, you need the treatment appropriate for these conditions. General treatment may include:  Elevation of the affected body part above the level of your heart.  Compression of the affected body part. Pressure from elastic bandages or support stockings squeezes the tissues and forces fluid back into the blood vessels. This keeps fluid from entering the tissues.  Restriction of fluid and salt intake.  Use of a water pill (diuretic). These medications are appropriate only for some types of edema. They pull fluid  out of your body and make you urinate more often. This gets rid of fluid and reduces swelling, but diuretics can have side effects. Only use diuretics as directed by your health care provider.  Follow these instructions at home:  Keep the affected body part above the level of your heart when you are lying down.  Do not sit still or stand for prolonged periods.  Do not put anything directly under your knees when lying down.  Do not wear constricting clothing or garters on your upper legs.  Exercise your legs to work the fluid back into your blood vessels. This may help the swelling go down.  Wear elastic bandages or support stockings to reduce ankle swelling as directed by your health care provider.  Eat a low-salt diet to reduce fluid if your health care provider recommends it.  Only take medicines as directed by your health care provider. Contact a health care provider if:  Your edema is not responding to treatment.  You have heart, liver, or kidney disease and notice symptoms of edema.  You have edema in your legs that does not improve after elevating them.  You have sudden and unexplained weight gain. Get help right away if:  You develop shortness of breath or chest pain.  You cannot breathe when you lie down.  You develop pain, redness, or warmth in the swollen areas.  You have heart, liver, or kidney disease and suddenly get edema.  You have a fever and your symptoms suddenly get worse. This information is   not intended to replace advice given to you by your health care provider. Make sure you discuss any questions you have with your health care provider. Document Released: 03/05/2005 Document Revised: 08/11/2015 Document Reviewed: 12/26/2012 [employer] Interactive Patient Education  2017 [employer]  

## 2017-11-21 ENCOUNTER — Other Ambulatory Visit: Payer: Self-pay

## 2017-11-21 DIAGNOSIS — E785 Hyperlipidemia, unspecified: Secondary | ICD-10-CM

## 2017-11-21 DIAGNOSIS — I10 Essential (primary) hypertension: Secondary | ICD-10-CM

## 2017-11-21 MED ORDER — AMLODIPINE BESYLATE 5 MG PO TABS
5.0000 mg | ORAL_TABLET | Freq: Every day | ORAL | 2 refills | Status: DC
Start: 2017-11-21 — End: 2018-10-16

## 2017-12-26 DIAGNOSIS — H02834 Dermatochalasis of left upper eyelid: Secondary | ICD-10-CM | POA: Diagnosis not present

## 2017-12-26 DIAGNOSIS — H02831 Dermatochalasis of right upper eyelid: Secondary | ICD-10-CM | POA: Diagnosis not present

## 2018-01-28 ENCOUNTER — Other Ambulatory Visit (INDEPENDENT_AMBULATORY_CARE_PROVIDER_SITE_OTHER): Payer: Medicare Other

## 2018-01-28 DIAGNOSIS — E785 Hyperlipidemia, unspecified: Secondary | ICD-10-CM

## 2018-01-28 LAB — LIPID PANEL
CHOLESTEROL: 269 mg/dL — AB (ref 0–200)
HDL: 48.1 mg/dL (ref >39.00)
LDL Cholesterol: 197 mg/dL — ABNORMAL HIGH (ref 0–99)
NONHDL: 221.37
TRIGLYCERIDES: 123 mg/dL (ref 0.0–149.0)
Total CHOL/HDL Ratio: 6
VLDL: 24.6 mg/dL (ref 0.0–40.0)

## 2018-02-03 ENCOUNTER — Other Ambulatory Visit: Payer: Self-pay

## 2018-02-03 DIAGNOSIS — E785 Hyperlipidemia, unspecified: Secondary | ICD-10-CM

## 2018-02-03 MED ORDER — PITAVASTATIN CALCIUM 2 MG PO TABS: ORAL_TABLET | ORAL | 2 refills | Status: DC

## 2018-03-27 ENCOUNTER — Ambulatory Visit: Payer: Medicare Other | Admitting: *Deleted

## 2018-04-24 ENCOUNTER — Other Ambulatory Visit: Payer: Self-pay | Admitting: Emergency Medicine

## 2018-04-24 DIAGNOSIS — E785 Hyperlipidemia, unspecified: Secondary | ICD-10-CM

## 2018-04-25 NOTE — Progress Notes (Addendum)
Subjective:   Felicia Martin is a 85 y.o. female who presents for Medicare Annual (Subsequent) preventive examination.  Pt involved in 4 different social groups. Pt has 4 daughters that call her daily.  Review of Systems: No ROS.  Medicare Wellness Visit. Additional risk factors are reflected in the social history. Cardiac Risk Factors include: advanced age (>93men, >91 women);dyslipidemia;hypertension;obesity (BMI >30kg/m2) Sleep patterns:  No issues Home Safety/Smoke Alarms: Feels safe in home. Smoke alarms in place.  Lives alone. No stairs. Step over tub with grab rails and bench.  Female:        Mammo- 11/07/17      Dexa scan-  Active order Eye- utd per pt. Goes every 6 months.    Objective:     Vitals: BP 140/82 (BP Location: Left Arm, Patient Position: Sitting, Cuff Size: Large)   Pulse 68   Ht 5' (1.524 m)   Wt 212 lb 3.2 oz (96.3 kg)   LMP 03/17/2014   SpO2 96%   BMI 41.44 kg/m   Body mass index is 41.44 kg/m.  Advanced Directives 04/28/2018 08/09/2017 03/26/2017 07/18/2016 02/23/2016 10/20/2015 02/23/2015  Does Patient Have a Medical Advance Directive? Yes No Yes Yes Yes No Yes  Type of Paramedic of [place];Living will - [place];Living will - [place];Living will - Living will;Healthcare Power of Attorney  Does patient want to make changes to medical advance directive? No - Patient declined - No - Patient declined No - Patient declined - - No - Patient declined  Copy of [place] in Chart? No - copy requested - No - copy requested - No - copy requested - No - copy requested  Would patient like information on creating a medical advance directive? - No - Patient declined - - - No - patient declined information -    Tobacco Social History   Tobacco Use  Smoking Status Former Smoker  . Packs/day: 0.50  . Years: 40.00  . Pack years: 20.00  . Types: Cigarettes  . Last attempt to quit:  05/25/1999  . Years since quitting: 18.9  Smokeless Tobacco Never Used     Counseling given: Not Answered   Clinical Intake: Pain : No/denies pain    Past Medical History:  Diagnosis Date  . Asthma   . GERD (gastroesophageal reflux disease)   . Hypercholesterolemia   . Hypertension   . Pulmonary fibrosis ([place])   . Sarcoidosis   . Surgical history of tubal ligation 1960s   Past Surgical History:  Procedure Laterality Date  . COLONOSCOPY  12/09/14  . ESOPHAGOGASTRODUODENOSCOPY  12/09/14  . LUNG SURGERY  1960   Right lung  . Maloney dilation  12/09/14  . TUBAL LIGATION  1960   Family History  Problem Relation Age of Onset  . Diabetes Sister        x's 3  . Alzheimer's disease Sister   . Liver cancer Mother   . Liver cancer Father   . Cancer Father   . Cancer Brother   . Diabetes Brother   . Hypertension Sister        x's 3  . Hyperlipidemia Other   . Breast cancer Neg Hx    Social History   Socioeconomic History  . Marital status: Single    Spouse name: Not on file  . Number of children: 4  . Years of education: Not on file  . Highest education level: Not on file  Occupational History  . Occupation: Chartered certified accountant  Social Needs  . Financial resource strain: Not on file  . Food insecurity:    Worry: Not on file    Inability: Not on file  . Transportation needs:    Medical: Not on file    Non-medical: Not on file  Tobacco Use  . Smoking status: Former Smoker    Packs/day: 0.50    Years: 40.00    Pack years: 20.00    Types: Cigarettes    Last attempt to quit: 05/25/1999    Years since quitting: 18.9  . Smokeless tobacco: Never Used  Substance and Sexual Activity  . Alcohol use: No  . Drug use: No  . Sexual activity: Never  Lifestyle  . Physical activity:    Days per week: Not on file    Minutes per session: Not on file  . Stress: Not on file  Relationships  . Social connections:    Talks on phone: Not on file    Gets together: Not on file     Attends religious service: Not on file    Active member of club or organization: Not on file    Attends meetings of clubs or organizations: Not on file    Relationship status: Not on file  Other Topics Concern  . Not on file  Social History Narrative  . Not on file    Outpatient Encounter Medications as of 04/28/2018  Medication Sig  . albuterol (PROVENTIL HFA;VENTOLIN HFA) 108 (90 Base) MCG/ACT inhaler Inhale 2 puffs into the lungs every 6 (six) hours as needed.  Marland Kitchen amLODipine (NORVASC) 5 MG tablet Take 1 tablet (5 mg total) by mouth daily.  . fluticasone (FLOVENT HFA) 110 MCG/ACT inhaler Inhale 2 puffs into the lungs 2 (two) times daily.  . meloxicam (MOBIC) 15 MG tablet TAKE 1 TABLET BY MOUTH EVERY DAY AS NEEDED FOR PAIN  . methocarbamol (ROBAXIN) 500 MG tablet Take 1 tablet (500 mg total) by mouth 2 (two) times daily as needed for muscle spasms.  . Pitavastatin Calcium (LIVALO) 2 MG TABS 1 po qhs   No facility-administered encounter medications on file as of 04/28/2018.     Activities of Daily Living In your present state of health, do you have any difficulty performing the following activities: 04/28/2018  Hearing? Y  Comment has hearing aids. wears only when in large crowds.  Vision? N  Difficulty concentrating or making decisions? N  Walking or climbing stairs? Y  Dressing or bathing? Y  Doing errands, shopping? Y  Preparing Food and eating ? N  Using the Toilet? N  In the past six months, have you accidently leaked urine? N  Do you have problems with loss of bowel control? N  Managing your Medications? N  Managing your Finances? N  Housekeeping or managing your Housekeeping? N  Some recent data might be hidden    Patient Care Team: Carollee Herter, Alferd Apa, DO as PCP - General (Family Medicine) Inda Castle, MD (Inactive) as Consulting Physician (Gastroenterology)    Assessment:   This is a routine wellness examination for Felicia Martin. Physical assessment deferred to  PCP.  Exercise Activities and Dietary recommendations Current Exercise Habits: Home exercise routine, Time (Minutes): 30, Frequency (Times/Week): 3, Weekly Exercise (Minutes/Week): 90, Intensity: Mild, Exercise limited by: None identified   Diet (meal preparation, eat out, water intake, caffeinated beverages, dairy products, fruits and vegetables): in general, a "healthy" diet  , on average, 3 meals per day Breakfast: cereal  and fruit. water Lunch: tuna salad sandwich fruit Dinner:    Chicken and vegetable.  Goals    . Continue to improve diet.   (pt-stated)       Fall Risk Fall Risk  04/28/2018 03/26/2017 02/23/2016 11/17/2015 09/01/2015  Falls in the past year? 0 No No No No  Risk for fall due to : - - - - -  Risk for fall due to: Comment - - - - -   Depression Screen PHQ 2/9 Scores 04/28/2018 03/26/2017 07/18/2016 02/23/2016  PHQ - 2 Score 0 0 0 0     Cognitive Function Ad8 score reviewed for issues:  Issues making decisions:no  Less interest in hobbies / activities:no  Repeats questions, stories (family complaining):no  Trouble using ordinary gadgets (microwave, computer, phone):no  Forgets the month or year: no  Mismanaging finances: no  Remembering appts:no  Daily problems with thinking and/or memory:no Ad8 score is=0  MMSE - Mini Mental State Exam 03/26/2017 02/23/2016 02/23/2015  Orientation to time 5 5 5   Orientation to Place 5 5 5   Registration 3 3 3   Attention/ Calculation 5 5 5   Recall 3 2 3   Language- name 2 objects 2 2 2   Language- repeat 1 1 1   Language- follow 3 step command 3 3 3   Language- read & follow direction 1 1 1   Write a sentence 1 1 1   Copy design 1 1 1   Total score 30 29 30         Immunization History  Administered Date(s) Administered  . Td 03/19/2002   Screening Tests Health Maintenance  Topic Date Due  . TETANUS/TDAP  03/18/2022 (Originally 03/19/2012)  . PNA vac Low Risk Adult (1 of 2 - PCV13) 03/19/2026 (Originally 01/23/2000)  .  INFLUENZA VACCINE  06/17/2026 (Originally 10/17/2017)  . MAMMOGRAM  11/08/2018  . DEXA SCAN  Completed        Plan:    Please schedule your next medicare wellness visit with me in 1 yr.  Continue to eat heart healthy diet (full of fruits, vegetables, whole grains, lean protein, water--limit salt, fat, and sugar intake) and increase physical activity as tolerated.  Continue doing brain stimulating activities (puzzles, reading, adult coloring books, staying active) to keep memory sharp.   Bring a copy of your living will and/or healthcare power of attorney to your next office visit.  Please schedule bone density scan as discussed.  I have personally reviewed and noted the following in the patient's chart:   . Medical and social history . Use of alcohol, tobacco or illicit drugs  . Current medications and supplements . Functional ability and status . Nutritional status . Physical activity . Advanced directives . List of other physicians . Hospitalizations, surgeries, and ER visits in previous 12 months . Vitals . Screenings to include cognitive, depression, and falls . Referrals and appointments  In addition, I have reviewed and discussed with patient certain preventive protocols, quality metrics, and best practice recommendations. A written personalized care plan for preventive services as well as general preventive health recommendations were provided to patient.     Shela Nevin, [place]  04/28/2018  Reviewed and agree with assessment & plan of RN.  Mackie Pai, PA-C

## 2018-04-28 ENCOUNTER — Ambulatory Visit (INDEPENDENT_AMBULATORY_CARE_PROVIDER_SITE_OTHER): Payer: Medicare Other | Admitting: *Deleted

## 2018-04-28 ENCOUNTER — Encounter: Payer: Self-pay | Admitting: *Deleted

## 2018-04-28 ENCOUNTER — Other Ambulatory Visit (INDEPENDENT_AMBULATORY_CARE_PROVIDER_SITE_OTHER): Payer: Medicare Other

## 2018-04-28 VITALS — BP 140/82 | HR 68 | Ht 60.0 in | Wt 212.2 lb

## 2018-04-28 DIAGNOSIS — Z0000 Encounter for general adult medical examination without abnormal findings: Secondary | ICD-10-CM | POA: Diagnosis not present

## 2018-04-28 DIAGNOSIS — E785 Hyperlipidemia, unspecified: Secondary | ICD-10-CM | POA: Diagnosis not present

## 2018-04-28 LAB — LIPID PANEL
CHOL/HDL RATIO: 5
Cholesterol: 268 mg/dL — ABNORMAL HIGH (ref 0–200)
HDL: 50.9 mg/dL (ref >39.00)
LDL Cholesterol: 195 mg/dL — ABNORMAL HIGH (ref 0–99)
NONHDL: 217.31
Triglycerides: 112 mg/dL (ref 0.0–149.0)
VLDL: 22.4 mg/dL (ref 0.0–40.0)

## 2018-04-28 NOTE — Patient Instructions (Signed)
Please schedule your next medicare wellness visit with me in 1 yr.  Continue to eat heart healthy diet (full of fruits, vegetables, whole grains, lean protein, water--limit salt, fat, and sugar intake) and increase physical activity as tolerated.  Continue doing brain stimulating activities (puzzles, reading, adult coloring books, staying active) to keep memory sharp.   Bring a copy of your living will and/or healthcare power of attorney to your next office visit.  Please schedule bone density scan as discussed.   Felicia Martin , Thank you for taking time to come for your Medicare Wellness Visit. I appreciate your ongoing commitment to your health goals. Please review the following plan we discussed and let me know if I can assist you in the future.   These are the goals we discussed: Goals    . Continue to improve diet.   (pt-stated)       This is a list of the screening recommended for you and due dates:  Health Maintenance  Topic Date Due  . Tetanus Vaccine  03/18/2022*  . Pneumonia vaccines (1 of 2 - PCV13) 03/19/2026*  . Flu Shot  06/17/2026*  . Mammogram  11/08/2018  . DEXA scan (bone density measurement)  Completed  *Topic was postponed. The date shown is not the original due date.    Health Maintenance After Age 42 After age 16, you are at a higher risk for certain long-term diseases and infections as well as injuries from falls. Falls are a major cause of broken bones and head injuries in people who are older than age 46. Getting regular preventive care can help to keep you healthy and well. Preventive care includes getting regular testing and making lifestyle changes as recommended by your health care provider. Talk with your health care provider about:  Which screenings and tests you should have. A screening is a test that checks for a disease when you have no symptoms.  A diet and exercise plan that is right for you. What should I know about screenings and tests to  prevent falls? Screening and testing are the best ways to find a health problem early. Early diagnosis and treatment give you the best chance of managing medical conditions that are common after age 23. Certain conditions and lifestyle choices may make you more likely to have a fall. Your health care provider may recommend:  Regular vision checks. Poor vision and conditions such as cataracts can make you more likely to have a fall. If you wear glasses, make sure to get your prescription updated if your vision changes.  Medicine review. Work with your health care provider to regularly review all of the medicines you are taking, including over-the-counter medicines. Ask your health care provider about any side effects that may make you more likely to have a fall. Tell your health care provider if any medicines that you take make you feel dizzy or sleepy.  Osteoporosis screening. Osteoporosis is a condition that causes the bones to get weaker. This can make the bones weak and cause them to break more easily.  Blood pressure screening. Blood pressure changes and medicines to control blood pressure can make you feel dizzy.  Strength and balance checks. Your health care provider may recommend certain tests to check your strength and balance while standing, walking, or changing positions.  Foot health exam. Foot pain and numbness, as well as not wearing proper footwear, can make you more likely to have a fall.  Depression screening. You may be more likely  to have a fall if you have a fear of falling, feel emotionally low, or feel unable to do activities that you used to do.  Alcohol use screening. Using too much alcohol can affect your balance and may make you more likely to have a fall. What actions can I take to lower my risk of falls? General instructions  Talk with your health care provider about your risks for falling. Tell your health care provider if: ? You fall. Be sure to tell your health  care provider about all falls, even ones that seem minor. ? You feel dizzy, sleepy, or off-balance.  Take over-the-counter and prescription medicines only as told by your health care provider. These include any supplements.  Eat a healthy diet and maintain a healthy weight. A healthy diet includes low-fat dairy products, low-fat (lean) meats, and fiber from whole grains, beans, and lots of fruits and vegetables. Home safety  Remove any tripping hazards, such as rugs, cords, and clutter.  Install safety equipment such as grab bars in bathrooms and safety rails on stairs.  Keep rooms and walkways well-lit. Activity   Follow a regular exercise program to stay fit. This will help you maintain your balance. Ask your health care provider what types of exercise are appropriate for you.  If you need a cane or walker, use it as recommended by your health care provider.  Wear supportive shoes that have nonskid soles. Lifestyle  Do not drink alcohol if your health care provider tells you not to drink.  If you drink alcohol, limit how much you have: ? 0-1 drink a day for women. ? 0-2 drinks a day for men.  Be aware of how much alcohol is in your drink. In the [place] one drink equals one typical bottle of beer (12 oz), one-half glass of wine (5 oz), or one shot of hard liquor (1 oz).  Do not use any products that contain nicotine or tobacco, such as cigarettes and e-cigarettes. If you need help quitting, ask your health care provider. Summary  Having a healthy lifestyle and getting preventive care can help to protect your health and wellness after age 62.  Screening and testing are the best way to find a health problem early and help you avoid having a fall. Early diagnosis and treatment give you the best chance for managing medical conditions that are more common for people who are older than age 67.  Falls are a major cause of broken bones and head injuries in people who are older than age  61. Take precautions to prevent a fall at home.  Work with your health care provider to learn what changes you can make to improve your health and wellness and to prevent falls. This information is not intended to replace advice given to you by your health care provider. Make sure you discuss any questions you have with your health care provider. Document Released: 01/16/2017 Document Revised: 01/16/2017 Document Reviewed: 01/16/2017 Elsevier Interactive Patient Education  2019 [employer].

## 2018-04-29 ENCOUNTER — Telehealth: Payer: Self-pay | Admitting: Family

## 2018-04-29 DIAGNOSIS — E785 Hyperlipidemia, unspecified: Secondary | ICD-10-CM

## 2018-04-29 NOTE — Telephone Encounter (Signed)
Cholesterol remains very high. Is she taking livalo? If so we need to increase livalo to 4mg  once daily #30 with 2 refills and repeat lipid panel in 6 weeks.

## 2018-05-01 MED ORDER — PITAVASTATIN CALCIUM 2 MG PO TABS: ORAL_TABLET | ORAL | 2 refills | Status: DC

## 2018-05-01 NOTE — Telephone Encounter (Signed)
Patient has not been taking cholesterol medication because of cost and on a fixed income.  She was taking the livalo 2mg  it cost her $47 for 30 tabs.  Advised that this medication may come in generic now and being new year would she like to try again.  Sent in refills for 2mg  caps.  She will call us if she cannot afford.

## 2018-05-08 ENCOUNTER — Ambulatory Visit (INDEPENDENT_AMBULATORY_CARE_PROVIDER_SITE_OTHER): Payer: Medicare Other | Admitting: Family Medicine

## 2018-05-08 ENCOUNTER — Encounter: Payer: Self-pay | Admitting: Family Medicine

## 2018-05-08 ENCOUNTER — Ambulatory Visit (HOSPITAL_BASED_OUTPATIENT_CLINIC_OR_DEPARTMENT_OTHER)
Admission: RE | Admit: 2018-05-08 | Discharge: 2018-05-08 | Disposition: A | Discharge status: Home / Self Care | Payer: Medicare Other | Source: Ambulatory Visit | Attending: Family Medicine | Admitting: Family Medicine

## 2018-05-08 VITALS — BP 138/86 | HR 88 | Temp 97.9°F | Resp 16 | Ht 60.0 in | Wt 213.4 lb

## 2018-05-08 DIAGNOSIS — M545 Low back pain, unspecified: Secondary | ICD-10-CM | POA: Insufficient documentation

## 2018-05-08 DIAGNOSIS — I10 Essential (primary) hypertension: Secondary | ICD-10-CM

## 2018-05-08 DIAGNOSIS — Z780 Asymptomatic menopausal state: Secondary | ICD-10-CM | POA: Insufficient documentation

## 2018-05-08 DIAGNOSIS — E785 Hyperlipidemia, unspecified: Secondary | ICD-10-CM | POA: Diagnosis not present

## 2018-05-08 DIAGNOSIS — M8589 Other specified disorders of bone density and structure, multiple sites: Secondary | ICD-10-CM | POA: Diagnosis not present

## 2018-05-08 MED ORDER — METHOCARBAMOL 500 MG PO TABS: 500.0000 mg | ORAL_TABLET | Freq: Three times a day (TID) | ORAL | 0 refills | Status: DC | PRN

## 2018-05-08 MED ORDER — PITAVASTATIN CALCIUM 2 MG PO TABS: ORAL_TABLET | ORAL | 2 refills | Status: DC

## 2018-05-08 NOTE — Patient Instructions (Signed)
DASH Eating Plan  DASH stands for "Dietary Approaches to Stop Hypertension." The DASH eating plan is a healthy eating plan that has been shown to reduce high blood pressure (hypertension). It may also reduce your risk for type 2 diabetes, heart disease, and stroke. The DASH eating plan may also help with weight loss.  What are tips for following this plan?    General guidelines   Avoid eating more than 2,300 mg (milligrams) of salt (sodium) a day. If you have hypertension, you may need to reduce your sodium intake to 1,500 mg a day.   Limit alcohol intake to no more than 1 drink a day for nonpregnant women and 2 drinks a day for men. One drink equals 12 oz of beer, 5 oz of wine, or 1 oz of hard liquor.   Work with your health care provider to maintain a healthy body weight or to lose weight. Ask what an ideal weight is for you.   Get at least 30 minutes of exercise that causes your heart to beat faster (aerobic exercise) most days of the week. Activities may include walking, swimming, or biking.   Work with your health care provider or diet and nutrition specialist (dietitian) to adjust your eating plan to your individual calorie needs.  Reading food labels     Check food labels for the amount of sodium per serving. Choose foods with less than 5 percent of the Daily Value of sodium. Generally, foods with less than 300 mg of sodium per serving fit into this eating plan.   To find whole grains, look for the word "whole" as the first word in the ingredient list.  Shopping   Buy products labeled as "low-sodium" or "no salt added."   Buy fresh foods. Avoid canned foods and premade or frozen meals.  Cooking   Avoid adding salt when cooking. Use salt-free seasonings or herbs instead of table salt or sea salt. Check with your health care provider or pharmacist before using salt substitutes.   Do not fry foods. Cook foods using healthy methods such as baking, boiling, grilling, and broiling instead.   Cook with  heart-healthy oils, such as olive, canola, soybean, or sunflower oil.  Meal planning   Eat a balanced diet that includes:  ? 5 or more servings of fruits and vegetables each day. At each meal, try to fill half of your plate with fruits and vegetables.  ? Up to 6-8 servings of whole grains each day.  ? Less than 6 oz of lean meat, poultry, or fish each day. A 3-oz serving of meat is about the same size as a deck of cards. One egg equals 1 oz.  ? 2 servings of low-fat dairy each day.  ? A serving of nuts, seeds, or beans 5 times each week.  ? Heart-healthy fats. Healthy fats called Omega-3 fatty acids are found in foods such as flaxseeds and coldwater fish, like sardines, salmon, and mackerel.   Limit how much you eat of the following:  ? Canned or prepackaged foods.  ? Food that is high in trans fat, such as fried foods.  ? Food that is high in saturated fat, such as fatty meat.  ? Sweets, desserts, sugary drinks, and other foods with added sugar.  ? Full-fat dairy products.   Do not salt foods before eating.   Try to eat at least 2 vegetarian meals each week.   Eat more home-cooked food and less restaurant, buffet, and fast food.     When eating at a restaurant, ask that your food be prepared with less salt or no salt, if possible.  What foods are recommended?  The items listed may not be a complete list. Talk with your dietitian about what dietary choices are best for you.  Grains  Whole-grain or whole-wheat bread. Whole-grain or whole-wheat pasta. Brown rice. Oatmeal. Quinoa. Bulgur. Whole-grain and low-sodium cereals. Pita bread. Low-fat, low-sodium crackers. Whole-wheat flour tortillas.  Vegetables  Fresh or frozen vegetables (raw, steamed, roasted, or grilled). Low-sodium or reduced-sodium tomato and vegetable juice. Low-sodium or reduced-sodium tomato sauce and tomato paste. Low-sodium or reduced-sodium canned vegetables.  Fruits  All fresh, dried, or frozen fruit. Canned fruit in natural juice (without  added sugar).  Meat and other protein foods  Skinless chicken or turkey. Ground chicken or turkey. Pork with fat trimmed off. Fish and seafood. Egg whites. Dried beans, peas, or lentils. Unsalted nuts, nut butters, and seeds. Unsalted canned beans. Lean cuts of beef with fat trimmed off. Low-sodium, lean deli meat.  Dairy  Low-fat (1%) or fat-free (skim) milk. Fat-free, low-fat, or reduced-fat cheeses. Nonfat, low-sodium ricotta or cottage cheese. Low-fat or nonfat yogurt. Low-fat, low-sodium cheese.  Fats and oils  Soft margarine without trans fats. Vegetable oil. Low-fat, reduced-fat, or light mayonnaise and salad dressings (reduced-sodium). Canola, safflower, olive, soybean, and sunflower oils. Avocado.  Seasoning and other foods  Herbs. Spices. Seasoning mixes without salt. Unsalted popcorn and pretzels. Fat-free sweets.  What foods are not recommended?  The items listed may not be a complete list. Talk with your dietitian about what dietary choices are best for you.  Grains  Baked goods made with fat, such as croissants, muffins, or some breads. Dry pasta or rice meal packs.  Vegetables  Creamed or fried vegetables. Vegetables in a cheese sauce. Regular canned vegetables (not low-sodium or reduced-sodium). Regular canned tomato sauce and paste (not low-sodium or reduced-sodium). Regular tomato and vegetable juice (not low-sodium or reduced-sodium). Pickles. Olives.  Fruits  Canned fruit in a light or heavy syrup. Fried fruit. Fruit in cream or butter sauce.  Meat and other protein foods  Fatty cuts of meat. Ribs. Fried meat. Bacon. Sausage. Bologna and other processed lunch meats. Salami. Fatback. Hotdogs. Bratwurst. Salted nuts and seeds. Canned beans with added salt. Canned or smoked fish. Whole eggs or egg yolks. Chicken or turkey with skin.  Dairy  Whole or 2% milk, cream, and half-and-half. Whole or full-fat cream cheese. Whole-fat or sweetened yogurt. Full-fat cheese. Nondairy creamers. Whipped toppings.  Processed cheese and cheese spreads.  Fats and oils  Butter. Stick margarine. Lard. Shortening. Ghee. Bacon fat. Tropical oils, such as coconut, palm kernel, or palm oil.  Seasoning and other foods  Salted popcorn and pretzels. Onion salt, garlic salt, seasoned salt, table salt, and sea salt. Worcestershire sauce. Tartar sauce. Barbecue sauce. Teriyaki sauce. Soy sauce, including reduced-sodium. Steak sauce. Canned and packaged gravies. Fish sauce. Oyster sauce. Cocktail sauce. Horseradish that you find on the shelf. Ketchup. Mustard. Meat flavorings and tenderizers. Bouillon cubes. Hot sauce and Tabasco sauce. Premade or packaged marinades. Premade or packaged taco seasonings. Relishes. Regular salad dressings.  Where to find more information:   National Heart, Lung, and Blood Institute: www.nhlbi.nih.gov   American Heart Association: www.heart.org  Summary   The DASH eating plan is a healthy eating plan that has been shown to reduce high blood pressure (hypertension). It may also reduce your risk for type 2 diabetes, heart disease, and stroke.   With the   DASH eating plan, you should limit salt (sodium) intake to 2,300 mg a day. If you have hypertension, you may need to reduce your sodium intake to 1,500 mg a day.   When on the DASH eating plan, aim to eat more fresh fruits and vegetables, whole grains, lean proteins, low-fat dairy, and heart-healthy fats.   Work with your health care provider or diet and nutrition specialist (dietitian) to adjust your eating plan to your individual calorie needs.  This information is not intended to replace advice given to you by your health care provider. Make sure you discuss any questions you have with your health care provider.  Document Released: 02/22/2011 Document Revised: 02/27/2016 Document Reviewed: 02/27/2016  Elsevier Interactive Patient Education  2019 [employer]

## 2018-05-08 NOTE — Assessment & Plan Note (Signed)
Well controlled, no changes to meds. Encouraged heart healthy diet such as the DASH diet and exercise as tolerated.  °

## 2018-05-08 NOTE — Progress Notes (Signed)
----------------------------------Patient ID: Felicia Martin, female    DOB: 1932-12-01  Age: 85 y.o. MRN: [id number]    Subjective:  Subjective  HPI Felicia Martin presents for f/u bp and cholesterol.   No complaints.   Review of Systems  Constitutional: Negative for appetite change, diaphoresis, fatigue and unexpected weight change.  Eyes: Negative for pain, redness and visual disturbance.  Respiratory: Negative for cough, chest tightness, shortness of breath and wheezing.   Cardiovascular: Negative for chest pain, palpitations and leg swelling.  Endocrine: Negative for cold intolerance, heat intolerance, polydipsia, polyphagia and polyuria.  Genitourinary: Negative for difficulty urinating, dysuria and frequency.  Neurological: Negative for dizziness, light-headedness, numbness and headaches.    History Past Medical History:  Diagnosis Date  . Asthma   . GERD (gastroesophageal reflux disease)   . Hypercholesterolemia   . Hypertension   . Pulmonary fibrosis ([place])   . Sarcoidosis   . Surgical history of tubal ligation 1960s    She has a past surgical history that includes Lung surgery (1960); Tubal ligation (1960); Maloney dilation (12/09/14); Colonoscopy (12/09/14); and Esophagogastroduodenoscopy (12/09/14).   Her family history includes Alzheimer's disease in her sister; Cancer in her brother and father; Diabetes in her brother and sister; Hyperlipidemia in an other family member; Hypertension in her sister; Liver cancer in her father and mother.She reports that she quit smoking about 18 years ago. Her smoking use included cigarettes. She has a 20.00 pack-year smoking history. She has never used smokeless tobacco. She reports that she does not drink alcohol or use drugs.  Current Outpatient Medications on File Prior to Visit  Medication Sig Dispense Refill  . albuterol (PROVENTIL HFA;VENTOLIN HFA) 108 (90 Base) MCG/ACT inhaler Inhale 2 puffs into the lungs every 6 (six)  hours as needed. 1 Inhaler 1  . amLODipine (NORVASC) 5 MG tablet Take 1 tablet (5 mg total) by mouth daily. 30 tablet 2  . fluticasone (FLOVENT HFA) 110 MCG/ACT inhaler Inhale 2 puffs into the lungs 2 (two) times daily. 1 Inhaler 3  . meloxicam (MOBIC) 15 MG tablet TAKE 1 TABLET BY MOUTH EVERY DAY AS NEEDED FOR PAIN 30 tablet 2   No current facility-administered medications on file prior to visit.      Objective:  Objective  Physical Exam Vitals signs and nursing note reviewed.  Constitutional:      Appearance: She is well-developed.  HENT:     Head: Normocephalic and atraumatic.  Eyes:     Conjunctiva/sclera: Conjunctivae normal.  Neck:     Musculoskeletal: Normal range of motion and neck supple.     Thyroid: No thyromegaly.     Vascular: No carotid bruit or JVD.  Cardiovascular:     Rate and Rhythm: Normal rate and regular rhythm.     Heart sounds: Normal heart sounds. No murmur.  Pulmonary:     Effort: Pulmonary effort is normal. No respiratory distress.     Breath sounds: Normal breath sounds. No wheezing or rales.  Chest:     Chest wall: No tenderness.  Neurological:     Mental Status: She is alert and oriented to person, place, and time.    BP 138/86 (BP Location: Right Arm, Cuff Size: Large)   Pulse 88   Temp 97.9 F (36.6 C) (Oral)   Resp 16   Ht 5' (1.524 m)   Wt 213 lb 6.4 oz (96.8 kg)   LMP 03/17/2014   SpO2 98%   BMI 41.68 kg/m  Wt Readings from Last 3  Encounters:  05/08/18 213 lb 6.4 oz (96.8 kg)  04/28/18 212 lb 3.2 oz (96.3 kg)  11/11/17 210 lb 12.8 oz (95.6 kg)     Lab Results  Component Value Date   WBC 4.4 12/16/2013   HGB 12.2 12/16/2013   HCT 36.7 12/16/2013   PLT 279.0 12/16/2013   GLUCOSE 119 (H) 11/11/2017   CHOL 268 (H) 04/28/2018   TRIG 112.0 04/28/2018   HDL 50.90 04/28/2018   LDLDIRECT 230.7 03/18/2013   LDLCALC 195 (H) 04/28/2018   ALT 9 11/11/2017   AST 14 11/11/2017   NA 139 11/11/2017   K 4.2 11/11/2017   CL 104  11/11/2017   CREATININE 0.87 11/11/2017   BUN 23 11/11/2017   CO2 27 11/11/2017   TSH 2.19 09/29/2009   HGBA1C 6.5 10/19/2015    Mm 3d Screen Breast Bilateral  Result Date: 11/07/2017 CLINICAL DATA:  Screening. EXAM: DIGITAL SCREENING BILATERAL MAMMOGRAM WITH TOMO AND CAD COMPARISON:  Previous exam(s). ACR Breast Density Category b: There are scattered areas of fibroglandular density. FINDINGS: There are no findings suspicious for malignancy. Images were processed with CAD. IMPRESSION: No mammographic evidence of malignancy. A result letter of this screening mammogram will be mailed directly to the patient. RECOMMENDATION: Screening mammogram in one year. (Code:SM-B-01Y) BI-RADS CATEGORY  1: Negative. Electronically Signed   By: Ammie Ferrier M.D.   On: 11/07/2017 15:00     Assessment & Plan:  Plan  I am having Felicia Martin maintain her albuterol, fluticasone, meloxicam, amLODipine, Pitavastatin Calcium, and methocarbamol.  Meds ordered this encounter  Medications  . DISCONTD: methocarbamol (ROBAXIN) 500 MG tablet    Sig: Take 1 tablet (500 mg total) by mouth every 8 (eight) hours as needed for muscle spasms.    Dispense:  30 tablet    Refill:  0  . Pitavastatin Calcium (LIVALO) 2 MG TABS    Sig: 1 po qhs    Dispense:  30 tablet    Refill:  2  . methocarbamol (ROBAXIN) 500 MG tablet    Sig: Take 1 tablet (500 mg total) by mouth every 8 (eight) hours as needed for muscle spasms.    Dispense:  30 tablet    Refill:  0    Problem List Items Addressed This Visit      Unprioritized   Acute left-sided low back pain without sciatica   Relevant Medications   methocarbamol (ROBAXIN) 500 MG tablet   Essential hypertension - Primary    Well controlled, no changes to meds. Encouraged heart healthy diet such as the DASH diet and exercise as tolerated.       Relevant Medications   Pitavastatin Calcium (LIVALO) 2 MG TABS   Other Relevant Orders   Lipid panel   Comprehensive  metabolic panel   Hyperlipidemia   Relevant Medications   Pitavastatin Calcium (LIVALO) 2 MG TABS   Other Relevant Orders   Lipid panel   Comprehensive metabolic panel      Follow-up: Return in about 6 months (around 11/06/2018) for annual exam, fasting.  Ann Held, DO

## 2018-05-29 ENCOUNTER — Telehealth: Payer: Self-pay | Admitting: *Deleted

## 2018-05-29 NOTE — Telephone Encounter (Signed)
Copied from [place] [id number]. Topic: General - Other >> May 28, 2018 11:27 AM Yvette Rack wrote: Reason for CRM: Monica a NP with [facility] called to report clinical findings. Attempted to connect to NT but there was a long hold. Monica reported the urine dipstick was 2 + glucose but there is no history of diabetes. Cb# [phone]

## 2018-05-30 NOTE — Telephone Encounter (Signed)
FYI

## 2018-05-30 NOTE — Telephone Encounter (Signed)
Appt scheduled 06/02/2018.

## 2018-05-30 NOTE — Telephone Encounter (Signed)
Felicia Martin returning call from the office.  Advised that Dr Etter Sjogren would like to see the pt.  Felicia Martin advised they only see the pt one time a year, and if anything abnormal, they let the dr know and the dr office follows up with the pt. I advised Felicia Martin I would call the pt.  I called the pt and made appt for Monday, 3/16 with Dr Etter Sjogren. Pt states she was glad I called.

## 2018-05-30 NOTE — Telephone Encounter (Signed)
Pt needs ov -- morning app please

## 2018-05-30 NOTE — Telephone Encounter (Signed)
LMOM for [facility], requesting call back. Okay for [facility] to discuss.

## 2018-06-02 ENCOUNTER — Ambulatory Visit (INDEPENDENT_AMBULATORY_CARE_PROVIDER_SITE_OTHER): Payer: Medicare Other | Admitting: Family Medicine

## 2018-06-02 ENCOUNTER — Other Ambulatory Visit: Payer: Self-pay

## 2018-06-02 ENCOUNTER — Encounter: Payer: Self-pay | Admitting: Family Medicine

## 2018-06-02 ENCOUNTER — Other Ambulatory Visit: Payer: Self-pay | Admitting: Family Medicine

## 2018-06-02 VITALS — BP 120/64 | HR 81 | Temp 98.2°F | Resp 18 | Ht 60.0 in | Wt 211.8 lb

## 2018-06-02 DIAGNOSIS — IMO0002 Reserved for concepts with insufficient information to code with codable children: Secondary | ICD-10-CM | POA: Insufficient documentation

## 2018-06-02 DIAGNOSIS — E1165 Type 2 diabetes mellitus with hyperglycemia: Secondary | ICD-10-CM | POA: Diagnosis not present

## 2018-06-02 DIAGNOSIS — I10 Essential (primary) hypertension: Secondary | ICD-10-CM

## 2018-06-02 DIAGNOSIS — E785 Hyperlipidemia, unspecified: Secondary | ICD-10-CM

## 2018-06-02 DIAGNOSIS — E1169 Type 2 diabetes mellitus with other specified complication: Secondary | ICD-10-CM

## 2018-06-02 DIAGNOSIS — E119 Type 2 diabetes mellitus without complications: Secondary | ICD-10-CM | POA: Insufficient documentation

## 2018-06-02 DIAGNOSIS — E118 Type 2 diabetes mellitus with unspecified complications: Secondary | ICD-10-CM | POA: Insufficient documentation

## 2018-06-02 DIAGNOSIS — E1151 Type 2 diabetes mellitus with diabetic peripheral angiopathy without gangrene: Secondary | ICD-10-CM

## 2018-06-02 DIAGNOSIS — R81 Glycosuria: Secondary | ICD-10-CM

## 2018-06-02 LAB — POC URINALSYSI DIPSTICK (AUTOMATED)
BILIRUBIN UA: NEGATIVE
Glucose, UA: POSITIVE — AB
KETONES UA: NEGATIVE
LEUKOCYTES UA: NEGATIVE
Nitrite, UA: NEGATIVE
PROTEIN UA: POSITIVE — AB
RBC UA: NEGATIVE
Spec Grav, UA: 1.025 (ref 1.010–1.025)
Urobilinogen, UA: 0.2 E.U./dL
pH, UA: 5.5 (ref 5.0–8.0)

## 2018-06-02 LAB — COMPREHENSIVE METABOLIC PANEL
ALK PHOS: 81 U/L (ref 39–117)
ALT: 8 U/L (ref 0–35)
AST: 13 U/L (ref 0–37)
Albumin: 4.1 g/dL (ref 3.5–5.2)
BUN: 23 mg/dL (ref 6–23)
CALCIUM: 9.8 mg/dL (ref 8.4–10.5)
CO2: 24 mEq/L (ref 19–32)
Chloride: 103 mEq/L (ref 96–112)
Creatinine, Ser: 0.99 mg/dL (ref 0.40–1.20)
GFR: 64.76 mL/min (ref >60.00)
GLUCOSE: 146 mg/dL — AB (ref 70–99)
POTASSIUM: 4.1 meq/L (ref 3.5–5.1)
SODIUM: 137 meq/L (ref 135–145)
TOTAL PROTEIN: 8 g/dL (ref 6.0–8.3)
Total Bilirubin: 0.3 mg/dL (ref 0.2–1.2)

## 2018-06-02 LAB — MICROALBUMIN / CREATININE URINE RATIO
Creatinine,U: 185 mg/dL
Microalb Creat Ratio: 1.2 mg/g (ref 0.0–30.0)
Microalb, Ur: 2.2 mg/dL — ABNORMAL HIGH (ref 0.0–1.9)

## 2018-06-02 LAB — HEMOGLOBIN A1C: HEMOGLOBIN A1C: 7.4 % — AB (ref 4.6–6.5)

## 2018-06-02 MED ORDER — METFORMIN HCL 500 MG PO TABS: 500.0000 mg | ORAL_TABLET | Freq: Two times a day (BID) | ORAL | 3 refills | Status: DC

## 2018-06-02 NOTE — Progress Notes (Signed)
Patient ID: Felicia Martin, female    DOB: 1932/07/05  Age: 85 y.o. MRN: [id number]    Subjective:  Subjective  HPI Felicia Martin presents for f/u glucose in her urine from [facility] visit   No other complaints.    Review of Systems  Constitutional: Negative for appetite change, diaphoresis, fatigue and unexpected weight change.  Eyes: Negative for pain, redness and visual disturbance.  Respiratory: Negative for cough, chest tightness, shortness of breath and wheezing.   Cardiovascular: Negative for chest pain, palpitations and leg swelling.  Endocrine: Negative for cold intolerance, heat intolerance, polydipsia, polyphagia and polyuria.  Genitourinary: Negative for difficulty urinating, dysuria and frequency.  Neurological: Negative for dizziness, light-headedness, numbness and headaches.    History Past Medical History:  Diagnosis Date   Asthma    GERD (gastroesophageal reflux disease)    Hypercholesterolemia    Hypertension    Pulmonary fibrosis ([place])    Sarcoidosis    Surgical history of tubal ligation 1960s    She has a past surgical history that includes Lung surgery (1960); Tubal ligation (1960); Maloney dilation (12/09/14); Colonoscopy (12/09/14); and Esophagogastroduodenoscopy (12/09/14).   Her family history includes Alzheimer's disease in her sister; Cancer in her brother and father; Diabetes in her brother and sister; Hyperlipidemia in an other family member; Hypertension in her sister; Liver cancer in her father and mother.She reports that she quit smoking about 19 years ago. Her smoking use included cigarettes. She has a 20.00 pack-year smoking history. She has never used smokeless tobacco. She reports that she does not drink alcohol or use drugs.  Current Outpatient Medications on File Prior to Visit  Medication Sig Dispense Refill   albuterol (PROVENTIL HFA;VENTOLIN HFA) 108 (90 Base) MCG/ACT inhaler Inhale 2 puffs into the lungs every 6 (six) hours as  needed. 1 Inhaler 1   amLODipine (NORVASC) 5 MG tablet Take 1 tablet (5 mg total) by mouth daily. 30 tablet 2   fluticasone (FLOVENT HFA) 110 MCG/ACT inhaler Inhale 2 puffs into the lungs 2 (two) times daily. 1 Inhaler 3   meloxicam (MOBIC) 15 MG tablet TAKE 1 TABLET BY MOUTH EVERY DAY AS NEEDED FOR PAIN 30 tablet 2   methocarbamol (ROBAXIN) 500 MG tablet Take 1 tablet (500 mg total) by mouth every 8 (eight) hours as needed for muscle spasms. 30 tablet 0   Pitavastatin Calcium (LIVALO) 2 MG TABS 1 po qhs (Patient not taking: Reported on 06/02/2018) 30 tablet 2   No current facility-administered medications on file prior to visit.      Objective:  Objective  Physical Exam Vitals signs and nursing note reviewed.  Constitutional:      Appearance: She is well-developed.  HENT:     Head: Normocephalic and atraumatic.  Eyes:     Conjunctiva/sclera: Conjunctivae normal.  Neck:     Musculoskeletal: Normal range of motion and neck supple.     Thyroid: No thyromegaly.     Vascular: No carotid bruit or JVD.  Cardiovascular:     Rate and Rhythm: Normal rate and regular rhythm.     Heart sounds: Normal heart sounds. No murmur.  Pulmonary:     Effort: Pulmonary effort is normal. No respiratory distress.     Breath sounds: Normal breath sounds. No wheezing or rales.  Chest:     Chest wall: No tenderness.  Neurological:     Mental Status: She is alert and oriented to person, place, and time.    BP 120/64 (BP Location: Left  Arm, Cuff Size: Large)    Pulse 81    Temp 98.2 F (36.8 C) (Oral)    Resp 18    Ht 5' (1.524 m)    Wt 211 lb 12.8 oz (96.1 kg)    LMP 03/17/2014    SpO2 100%    BMI 41.36 kg/m  Wt Readings from Last 3 Encounters:  06/02/18 211 lb 12.8 oz (96.1 kg)  05/08/18 213 lb 6.4 oz (96.8 kg)  04/28/18 212 lb 3.2 oz (96.3 kg)     Lab Results  Component Value Date   WBC 4.4 12/16/2013   HGB 12.2 12/16/2013   HCT 36.7 12/16/2013   PLT 279.0 12/16/2013   GLUCOSE 146 (H)  06/02/2018   CHOL 268 (H) 04/28/2018   TRIG 112.0 04/28/2018   HDL 50.90 04/28/2018   LDLDIRECT 230.7 03/18/2013   LDLCALC 195 (H) 04/28/2018   ALT 8 06/02/2018   AST 13 06/02/2018   NA 137 06/02/2018   K 4.1 06/02/2018   CL 103 06/02/2018   CREATININE 0.99 06/02/2018   BUN 23 06/02/2018   CO2 24 06/02/2018   TSH 2.19 09/29/2009   HGBA1C 7.4 (H) 06/02/2018   MICROALBUR 2.2 (H) 06/02/2018    Dg Bone Density  Result Date: 05/08/2018 EXAM: DUAL X-RAY ABSORPTIOMETRY (DXA) FOR BONE MINERAL DENSITY IMPRESSION: Felicia Martin Felicia Martin completed a BMD test on 05/08/2018 using the Archer (analysis version: 16.SP2) manufactured by [employer]. The following summarizes the results of our evaluation. PATIENT: Name: Felicia, Martin Patient ID: [id number] Birth Date: 1933/01/16 Height: 60.0 in. Gender: Female Measured: 05/08/2018 Weight: 212.8 lbs. Indications: Advanced Age, African-American, Estrogen Deficiency, Post Menopausal, Previous Tobacco User Fractures: Treatments: ASSESSMENT: The BMD measured at Femur Neck Left is 0.832 g/cm2 with a T-score of -1.5. This patient is considered osteopenic according to [facility]) criteria. Scan quality was good. Site Region Measured Date Measured Age WHO YA BMD Classification T-score AP Spine L1-L4 05/08/2018 [age] Osteopenia -1.3 1.021 g/cm2 DualFemur Neck Left 05/08/2018 [age] Osteopenia -1.5 0.832 g/cm2 Left Forearm Radius 33% 05/08/2018 83.2 Normal -1.0 0.788 g/cm2 World Health Organization [facility]) criteria for post-menopausal, Caucasian Women: Normal       T-score at or above -1 SD Osteopenia   T-score between -1 and -2.5 SD Osteoporosis T-score at or below -2.5 SD RECOMMENDATION: 1. All patients should optimize calcium and vitamin D intake. 2. Consider FDA-approved medical therapies in postmenopausal women and men aged 57 years and older, based on the following: a. A hip or vertebral(clinical  or morphometric) fracture. b. T-Score < -2.5 at the femoral neck or spine after appropriate evaluation to exclude secondary causes c. Low bone mass (T-score between -1.0 and -2.5 at the femoral neck or spine) and a 10 year probability of a hip fracture >3% or a 10 year probability of major osteoporosis-related fracture > 20% based on the [place]-adapted WHO algorithm d. Clinical judgement and/or patient preferences may indicate treatment for people with 10-year fracture probabilities above or below these levels FOLLOW-UP: Patients with diagnosis of osteoporosis or at high risk for fracture should have regular bone mineral density tests. For patients eligible for Medicare, routine testing is allowed once every 2 years. The testing frequency can be increased to one year for patients who have rapidly progressing disease, those who are receiving or discontinuing medical therapy to restore bone mass, or have additional risk factors. I have reviewed this report and agree with the above  findings. [facility] Patient: [name] Referring Physician: Rosalita Chessman Martin Birth Date: 1932-06-13 [age] Patient ID: [id number] Height: 60.0 in. Weight: 212.8 lbs. Measured: 05/08/2018 10:37:42 AM (16 SP 2) Gender: Female Ethnicity: Black Analyzed: 05/08/2018 10:47:34 AM (16 SP 2) FRAX* 10-year Probability of Fracture Based on femoral neck BMD: DualFemur (Left) Major Osteoporotic Fracture: 5.3% Hip Fracture:                1.3% Population:                  [place] (Black) Risk Factors:                None *FRAX is a Materials engineer of the [employer] for Metabolic Bone Disease, a [place] (WHO) [facility]. ASSESSMENT: The probability of a major osteoporotic fracture is 5.3% within the next ten years. The probability of a hip fracture is 1.3% within the next ten years. Electronically Signed   By: Lowella Grip III M.D.   On: 05/08/2018 10:23       Assessment & Plan:  Plan  I am having Felicia Martin maintain her albuterol, fluticasone, meloxicam, amLODipine, Pitavastatin Calcium, and methocarbamol.  No orders of the defined types were placed in this encounter.   Problem List Items Addressed This Visit      Unprioritized   DM (diabetes mellitus) type II uncontrolled, periph vascular disorder ([place])    Other Visit Diagnoses    Glucose found in urine on examination    -  Primary   Relevant Orders   Hemoglobin A1c (Completed)   Comprehensive metabolic panel (Completed)   Microalbumin / creatinine urine ratio (Completed)   POCT Urinalysis Dipstick (Automated) (Completed)      Follow-up: Return in about 3 months (around 09/02/2018), or if symptoms worsen or fail to improve, for hypertension, hyperlipidemia, glucose.  Ann Held, DO

## 2018-06-02 NOTE — Patient Instructions (Signed)
Hyperglycemia Hyperglycemia occurs when the level of sugar (glucose) in the blood is too high. Glucose is a type of sugar that provides the body's main source of energy. Certain hormones (insulin and glucagon) control the level of glucose in the blood. Insulin lowers blood glucose, and glucagon increases blood glucose. Hyperglycemia can result from having too little insulin in the bloodstream, or from the body not responding normally to insulin. Hyperglycemia occurs most often in people who have diabetes (diabetes mellitus), but it can happen in people who do not have diabetes. It can develop quickly, and it can be life-threatening if it causes you to become severely dehydrated (diabetic ketoacidosis or hyperglycemic hyperosmolar state). Severe hyperglycemia is a medical emergency. What are the causes? If you have diabetes, hyperglycemia may be caused by:  Diabetes medicine.  Medicines that increase blood glucose or affect your diabetes control.  Not eating enough, or not eating often enough.  Changes in physical activity level.  Being sick or having an infection. If you have prediabetes or undiagnosed diabetes:  Hyperglycemia may be caused by those conditions. If you do not have diabetes, hyperglycemia may be caused by:  Certain medicines, including steroid medicines, beta-blockers, epinephrine, and thiazide diuretics.  Stress.  Serious illness.  Surgery.  Diseases of the pancreas.  Infection. What increases the risk? Hyperglycemia is more likely to develop in people who have risk factors for diabetes, such as:  Having a family member with diabetes.  Having a gene for type 1 diabetes that is passed from parent to child (inherited).  Living in an area with cold weather conditions.  Exposure to certain viruses.  Certain conditions in which the body's disease-fighting (immune) system attacks itself (autoimmune disorders).  Being overweight or obese.  Having an inactive  (sedentary) lifestyle.  Having been diagnosed with insulin resistance.  Having a history of prediabetes, gestational diabetes, or polycystic ovarian syndrome (PCOS).  Being of American-Indian, African-American, Hispanic/Latino, or Asian/Pacific Islander descent. What are the signs or symptoms? Hyperglycemia may not cause any symptoms. If you do have symptoms, they may include early warning signs, such as:  Increased thirst.  Hunger.  Feeling very tired.  Needing to urinate more often than usual.  Blurry vision. Other symptoms may develop if hyperglycemia gets worse, such as:  Dry mouth.  Loss of appetite.  Fruity-smelling breath.  Weakness.  Unexpected or rapid weight gain or weight loss.  Tingling or numbness in the hands or feet.  Headache.  Skin that does not quickly return to normal after being lightly pinched and released (poor skin turgor).  Abdominal pain.  Cuts or bruises that are slow to heal. How is this diagnosed? Hyperglycemia is diagnosed with a blood test to measure your blood glucose level. This blood test is usually done while you are having symptoms. Your health care provider may also do a physical exam and review your medical history. You may have more tests to determine the cause of your hyperglycemia, such as:  A fasting blood glucose (FBG) test. You will not be allowed to eat (you will fast) for at least 8 hours before a blood sample is taken.  An A1c (hemoglobin A1c) blood test. This provides information about blood glucose control over the previous 2-3 months.  An oral glucose tolerance test (OGTT). This measures your blood glucose at two times: ? After fasting. This is your baseline blood glucose level. ? Two hours after drinking a beverage that contains glucose. How is this treated? Treatment depends on the cause   of your hyperglycemia. Treatment may include:  Taking medicine to regulate your blood glucose levels. If you take insulin or  other diabetes medicines, your medicine or dosage may be adjusted.  Lifestyle changes, such as exercising more, eating healthier foods, or losing weight.  Treating an illness or infection, if this caused your hyperglycemia.  Checking your blood glucose more often.  Stopping or reducing steroid medicines, if these caused your hyperglycemia. If your hyperglycemia becomes severe and it results in hyperglycemic hyperosmolar state, you must be hospitalized and given IV fluids. Follow these instructions at home:  General instructions  Take over-the-counter and prescription medicines only as told by your health care provider.  Do not use any products that contain nicotine or tobacco, such as cigarettes and e-cigarettes. If you need help quitting, ask your health care provider.  Limit alcohol intake to no more than 1 drink per day for nonpregnant women and 2 drinks per day for men. One drink equals 12 oz of beer, 5 oz of wine, or 1 oz of hard liquor.  Learn to manage stress. If you need help with this, ask your health care provider.  Keep all follow-up visits as told by your health care provider. This is important. Eating and drinking   Maintain a healthy weight.  Exercise regularly, as directed by your health care provider.  Stay hydrated, especially when you exercise, get sick, or spend time in hot temperatures.  Eat healthy foods, such as: ? Lean proteins. ? Complex carbohydrates. ? Fresh fruits and vegetables. ? Low-fat dairy products. ? Healthy fats.  Drink enough fluid to keep your urine clear or pale yellow. If you have diabetes:  Make sure you know the symptoms of hyperglycemia.  Follow your diabetes management plan, as told by your health care provider. Make sure you: ? Take your insulin and medicines as directed. ? Follow your exercise plan. ? Follow your meal plan. Eat on time, and do not skip meals. ? Check your blood glucose as often as directed. Make sure to  check your blood glucose before and after exercise. If you exercise longer or in a different way than usual, check your blood glucose more often. ? Follow your sick day plan whenever you cannot eat or drink normally. Make this plan in advance with your health care provider.  Share your diabetes management plan with people in your workplace, school, and household.  Check your urine for ketones when you are ill and as told by your health care provider.  Carry a medical alert card or wear medical alert jewelry. Contact a health care provider if:  Your blood glucose is at or above 240 mg/dL (13.3 mmol/L) for 2 days in a row.  You have problems keeping your blood glucose in your target range.  You have frequent episodes of hyperglycemia. Get help right away if:  You have difficulty breathing.  You have a change in how you think, feel, or act (mental status).  You have nausea or vomiting that does not go away. These symptoms may represent a serious problem that is an emergency. Do not wait to see if the symptoms will go away. Get medical help right away. Call your local emergency services (911 in the [place] Do not drive yourself to the hospital. Summary  Hyperglycemia occurs when the level of sugar (glucose) in the blood is too high.  Hyperglycemia is diagnosed with a blood test to measure your blood glucose level. This blood test is usually done while you are   having symptoms. Your health care provider may also do a physical exam and review your medical history.  If you have diabetes, follow your diabetes management plan as told by your health care provider.  Contact your health care provider if you have problems keeping your blood glucose in your target range. This information is not intended to replace advice given to you by your health care provider. Make sure you discuss any questions you have with your health care provider. Document Released: 08/29/2000 Document Revised: 11/21/2015  Document Reviewed: 11/21/2015 Elsevier Interactive Patient Education  2019 Elsevier Inc.  

## 2018-06-03 ENCOUNTER — Telehealth: Payer: Self-pay | Admitting: *Deleted

## 2018-06-03 NOTE — Telephone Encounter (Signed)
Spoke with patient she will try to get medication when she when she gets money.  Medication at this time costs $47.

## 2018-06-03 NOTE — Telephone Encounter (Signed)
Copied from [place] [phone]. Topic: General - Other >> Jun 02, 2018  5:06 PM Valla Leaver wrote: Reason for CRM: Lonn Georgia w/ [facility] calling to notify Guerry Bruin that the application for patient assistance is denied due to patients income.

## 2018-06-03 NOTE — Telephone Encounter (Signed)
Noted  

## 2018-06-04 ENCOUNTER — Other Ambulatory Visit: Payer: Self-pay | Admitting: *Deleted

## 2018-06-04 MED ORDER — BLOOD GLUCOSE METER KIT: PACK | 0 refills | Status: DC

## 2018-06-04 MED ORDER — GLUCOSE BLOOD VI STRP: ORAL_STRIP | 1 refills | Status: DC

## 2018-06-04 MED ORDER — LANCETS MISC: 1 refills | Status: DC

## 2018-06-10 ENCOUNTER — Other Ambulatory Visit: Payer: Self-pay | Admitting: *Deleted

## 2018-06-10 ENCOUNTER — Other Ambulatory Visit: Payer: Self-pay

## 2018-06-10 ENCOUNTER — Ambulatory Visit: Payer: Medicare Other

## 2018-06-10 MED ORDER — ONETOUCH VERIO W/DEVICE KIT: PACK | 0 refills | Status: DC

## 2018-06-11 ENCOUNTER — Ambulatory Visit: Payer: Medicare Other

## 2018-06-11 DIAGNOSIS — E1151 Type 2 diabetes mellitus with diabetic peripheral angiopathy without gangrene: Secondary | ICD-10-CM

## 2018-06-11 DIAGNOSIS — IMO0002 Reserved for concepts with insufficient information to code with codable children: Secondary | ICD-10-CM

## 2018-06-11 DIAGNOSIS — E1165 Type 2 diabetes mellitus with hyperglycemia: Principal | ICD-10-CM

## 2018-06-11 NOTE — Progress Notes (Signed)
Pre visit review using our clinic tool,if applicable. No additional management support is needed unless otherwise documented below in the visit note.   Patient in per order from Dr. Floy Sabina for DM teaching and instructions on how to use her glucose monitoring machine.  Demonstration given to patient and return demonstration requested which she di correctly. Advised to call office if she felt the need for further instructions patient agreed.

## 2018-07-10 ENCOUNTER — Encounter: Payer: Self-pay | Admitting: Family Medicine

## 2018-07-10 ENCOUNTER — Ambulatory Visit (INDEPENDENT_AMBULATORY_CARE_PROVIDER_SITE_OTHER): Payer: Medicare Other | Admitting: Family Medicine

## 2018-07-10 ENCOUNTER — Other Ambulatory Visit: Payer: Self-pay

## 2018-07-10 DIAGNOSIS — R600 Localized edema: Secondary | ICD-10-CM

## 2018-07-10 DIAGNOSIS — I10 Essential (primary) hypertension: Secondary | ICD-10-CM | POA: Diagnosis not present

## 2018-07-10 MED ORDER — HYDROCHLOROTHIAZIDE 25 MG PO TABS: 25.0000 mg | ORAL_TABLET | Freq: Every day | ORAL | 3 refills | Status: DC

## 2018-07-10 NOTE — Assessment & Plan Note (Signed)
Slightly elevated today Add hctz daily F/u 2 weeks or sooner prn

## 2018-07-10 NOTE — Progress Notes (Signed)
Virtual Visit via Video Note  I connected with Felicia Martin on 07/10/18 at 11:00 AM EDT by a video enabled telemedicine application and verified that I am speaking with the correct person using two identifiers.   I discussed the limitations of evaluation and management by telemedicine and the availability of in person appointments. The patient expressed understanding and agreed to proceed.  History of Present Illness: Pt is home with her daughter-- c/o swelling in both ankles/ feet.  No cp, no sob, no calf pain, no errythema -- she has noticed this the last few days   She is trying to sit with her legs up  provider is working from home   Past Medical History:  Diagnosis Date  . Asthma   . GERD (gastroesophageal reflux disease)   . Hypercholesterolemia   . Hypertension   . Pulmonary fibrosis ([place])   . Sarcoidosis   . Surgical history of tubal ligation 1960s   Current Outpatient Medications on File Prior to Visit  Medication Sig Dispense Refill  . albuterol (PROVENTIL HFA;VENTOLIN HFA) 108 (90 Base) MCG/ACT inhaler Inhale 2 puffs into the lungs every 6 (six) hours as needed. 1 Inhaler 1  . amLODipine (NORVASC) 5 MG tablet Take 1 tablet (5 mg total) by mouth daily. 30 tablet 2  . Blood Glucose Monitoring Suppl (ONETOUCH VERIO) w/Device KIT Use as directed once a day.  Dx code: E11.9 1 kit 0  . fluticasone (FLOVENT HFA) 110 MCG/ACT inhaler Inhale 2 puffs into the lungs 2 (two) times daily. 1 Inhaler 3  . glucose blood test strip Use as instructed once a day.  Dx Code: E11.9 100 each 1  . Lancets MISC Use once a day.  Dx Code: E11.9 100 each 1  . meloxicam (MOBIC) 15 MG tablet TAKE 1 TABLET BY MOUTH EVERY DAY AS NEEDED FOR PAIN 30 tablet 2  . metFORMIN (GLUCOPHAGE) 500 MG tablet Take 1 tablet (500 mg total) by mouth 2 (two) times daily with a meal. 60 tablet 3  . methocarbamol (ROBAXIN) 500 MG tablet Take 1 tablet (500 mg total) by mouth every 8 (eight) hours as needed for muscle  spasms. 30 tablet 0  . Pitavastatin Calcium (LIVALO) 2 MG TABS 1 po qhs (Patient not taking: Reported on 06/02/2018) 30 tablet 2   No current facility-administered medications on file prior to visit.     Observations/Objective: bp  145/79  p 89       Afebrile   Pt is in NAD, no sob, rr normal  Assessment and Plan: 1. Lower extremity edema Elevated legs Watch salt in diet rx hctz Eat K rich foods F/u 2 weeks or sooner prn  - hydrochlorothiazide (HYDRODIURIL) 25 MG tablet; Take 1 tablet (25 mg total) by mouth daily.  Dispense: 90 tablet; Refill: 3   Follow Up Instructions:    I discussed the assessment and treatment plan with the patient. The patient was provided an opportunity to ask questions and all were answered. The patient agreed with the plan and demonstrated an understanding of the instructions.   The patient was advised to call back or seek an in-person evaluation if the symptoms worsen or if the condition fails to improve as anticipated.  I provided 30 minutes of non-face-to-face time during this encounter.   Ann Held, DO

## 2018-07-24 ENCOUNTER — Encounter: Payer: Self-pay | Admitting: Family Medicine

## 2018-07-24 ENCOUNTER — Other Ambulatory Visit: Payer: Self-pay

## 2018-07-24 ENCOUNTER — Ambulatory Visit (INDEPENDENT_AMBULATORY_CARE_PROVIDER_SITE_OTHER): Payer: Medicare Other | Admitting: Family Medicine

## 2018-07-24 DIAGNOSIS — R600 Localized edema: Secondary | ICD-10-CM | POA: Insufficient documentation

## 2018-07-24 DIAGNOSIS — IMO0002 Reserved for concepts with insufficient information to code with codable children: Secondary | ICD-10-CM

## 2018-07-24 DIAGNOSIS — E1151 Type 2 diabetes mellitus with diabetic peripheral angiopathy without gangrene: Secondary | ICD-10-CM

## 2018-07-24 DIAGNOSIS — E1165 Type 2 diabetes mellitus with hyperglycemia: Secondary | ICD-10-CM

## 2018-07-24 MED ORDER — METFORMIN HCL ER 500 MG PO TB24: 500.0000 mg | ORAL_TABLET | Freq: Every day | ORAL | 2 refills | Status: DC

## 2018-07-24 NOTE — Progress Notes (Signed)
Virtual Visit via Video Note  I connected with Felicia Martin on 07/24/18 at  4:00 PM EDT by a video enabled telemedicine application and verified that I am speaking with the correct person using two identifiers  Location: Patient: home with daughter  Provider: home    I discussed the limitations of evaluation and management by telemedicine and the availability of in person appointments. The patient expressed understanding and agreed to proceed.  History of Present Illness: Pt is home with c/o meformin causing nausea at night and sometimes vomiting .   Otherwise no complaints.     Observations/Objective: 133/75  p 82  Afebrile  Pt is in NAD No edema in low ext Assessment and Plan:  1. DM (diabetes mellitus) type II uncontrolled, periph vascular disorder (HCC) Change metformin bid to qd Call us if there is a problem - metFORMIN (GLUCOPHAGE XR) 500 MG 24 hr tablet; Take 1 tablet (500 mg total) by mouth daily with breakfast.  Dispense: 30 tablet; Refill: 2  2. Lower extremity edema Better with lasix con't to eat K rich foods Elevated legs F/u June / July   Follow Up Instructions:    I discussed the assessment and treatment plan with the patient. The patient was provided an opportunity to ask questions and all were answered. The patient agreed with the plan and demonstrated an understanding of the instructions.   The patient was advised to call back or seek an in-person evaluation if the symptoms worsen or if the condition fails to improve as anticipated.  I provided 15 minutes of non-face-to-face time during this encounter.   Ann Held, DO

## 2018-09-16 ENCOUNTER — Other Ambulatory Visit: Payer: Self-pay

## 2018-09-16 ENCOUNTER — Ambulatory Visit (INDEPENDENT_AMBULATORY_CARE_PROVIDER_SITE_OTHER): Payer: Medicare Other | Admitting: Family Medicine

## 2018-09-16 ENCOUNTER — Encounter: Payer: Self-pay | Admitting: Family Medicine

## 2018-09-16 VITALS — BP 154/86 | HR 98 | Temp 98.3°F | Resp 18 | Ht 60.0 in | Wt 209.8 lb

## 2018-09-16 DIAGNOSIS — J45909 Unspecified asthma, uncomplicated: Secondary | ICD-10-CM | POA: Diagnosis not present

## 2018-09-16 DIAGNOSIS — M545 Low back pain, unspecified: Secondary | ICD-10-CM

## 2018-09-16 DIAGNOSIS — H6123 Impacted cerumen, bilateral: Secondary | ICD-10-CM | POA: Insufficient documentation

## 2018-09-16 DIAGNOSIS — J4520 Mild intermittent asthma, uncomplicated: Secondary | ICD-10-CM

## 2018-09-16 MED ORDER — METHOCARBAMOL 500 MG PO TABS: 500.0000 mg | ORAL_TABLET | Freq: Three times a day (TID) | ORAL | 0 refills | Status: DC | PRN

## 2018-09-16 MED ORDER — FLUTICASONE PROPIONATE HFA 110 MCG/ACT IN AERO: 2.0000 | INHALATION_SPRAY | Freq: Two times a day (BID) | RESPIRATORY_TRACT | 3 refills | Status: DC

## 2018-09-16 MED ORDER — ALBUTEROL SULFATE HFA 108 (90 BASE) MCG/ACT IN AERS: 2.0000 | INHALATION_SPRAY | Freq: Four times a day (QID) | RESPIRATORY_TRACT | 2 refills | Status: DC | PRN

## 2018-09-16 NOTE — Assessment & Plan Note (Signed)
Pt will use debrox and rto on Thursday for irrigation

## 2018-09-16 NOTE — Assessment & Plan Note (Signed)
Refill inhalers.

## 2018-09-16 NOTE — Patient Instructions (Addendum)
Debrox for ear wax    Earwax Buildup, Adult The ears produce a substance called earwax that helps keep bacteria out of the ear and protects the skin in the ear canal. Occasionally, earwax can build up in the ear and cause discomfort or hearing loss. What increases the risk? This condition is more likely to develop in people who:  Are female.  Are elderly.  Naturally produce more earwax.  Clean their ears often with cotton swabs.  Use earplugs often.  Use in-ear headphones often.  Wear hearing aids.  Have narrow ear canals.  Have earwax that is overly thick or sticky.  Have eczema.  Are dehydrated.  Have excess hair in the ear canal. What are the signs or symptoms? Symptoms of this condition include:  Reduced or muffled hearing.  A feeling of fullness in the ear or feeling that the ear is plugged.  Fluid coming from the ear.  Ear pain.  Ear itch.  Ringing in the ear.  Coughing.  An obvious piece of earwax that can be seen inside the ear canal. How is this diagnosed? This condition may be diagnosed based on:  Your symptoms.  Your medical history.  An ear exam. During the exam, your health care provider will look into your ear with an instrument called an otoscope. You may have tests, including a hearing test. How is this treated? This condition may be treated by:  Using ear drops to soften the earwax.  Having the earwax removed by a health care provider. The health care provider may: ? Flush the ear with water. ? Use an instrument that has a loop on the end (curette). ? Use a suction device.  Surgery to remove the wax buildup. This may be done in severe cases. Follow these instructions at home:   Take over-the-counter and prescription medicines only as told by your health care provider.  Do not put any objects, including cotton swabs, into your ear. You can clean the opening of your ear canal with a washcloth or facial tissue.  Follow  instructions from your health care provider about cleaning your ears. Do not over-clean your ears.  Drink enough fluid to keep your urine clear or pale yellow. This will help to thin the earwax.  Keep all follow-up visits as told by your health care provider. If earwax builds up in your ears often or if you use hearing aids, consider seeing your health care provider for routine, preventive ear cleanings. Ask your health care provider how often you should schedule your cleanings.  If you have hearing aids, clean them according to instructions from the manufacturer and your health care provider. Contact a health care provider if:  You have ear pain.  You develop a fever.  You have blood, pus, or other fluid coming from your ear.  You have hearing loss.  You have ringing in your ears that does not go away.  Your symptoms do not improve with treatment.  You feel like the room is spinning (vertigo). Summary  Earwax can build up in the ear and cause discomfort or hearing loss.  The most common symptoms of this condition include reduced or muffled hearing and a feeling of fullness in the ear or feeling that the ear is plugged.  This condition may be diagnosed based on your symptoms, your medical history, and an ear exam.  This condition may be treated by using ear drops to soften the earwax or by having the earwax removed by a health  care provider.  Do not put any objects, including cotton swabs, into your ear. You can clean the opening of your ear canal with a washcloth or facial tissue. This information is not intended to replace advice given to you by your health care provider. Make sure you discuss any questions you have with your health care provider. Document Released: 04/12/2004 Document Revised: 02/15/2017 Document Reviewed: 05/16/2016 Elsevier Patient Education  2020 [employer].

## 2018-09-16 NOTE — Progress Notes (Signed)
Patient ID: Felicia Martin, female    DOB: 1932/11/15  Age: 85 y.o. MRN: [id number]    Subjective:  Subjective  HPI Felicia Martin presents for ears feeling full.  No other symptoms   Review of Systems  Constitutional: Negative for appetite change, diaphoresis, fatigue and unexpected weight change.  HENT: Positive for ear pain. Negative for congestion.   Eyes: Negative for pain, redness and visual disturbance.  Respiratory: Negative for cough, chest tightness, shortness of breath and wheezing.   Cardiovascular: Negative for chest pain, palpitations and leg swelling.  Endocrine: Negative for cold intolerance, heat intolerance, polydipsia, polyphagia and polyuria.  Genitourinary: Negative for difficulty urinating, dysuria and frequency.  Neurological: Negative for dizziness, light-headedness, numbness and headaches.    History Past Medical History:  Diagnosis Date   Asthma    GERD (gastroesophageal reflux disease)    Hypercholesterolemia    Hypertension    Pulmonary fibrosis ([place])    Sarcoidosis    Surgical history of tubal ligation 1960s    She has a past surgical history that includes Lung surgery (1960); Tubal ligation (1960); Maloney dilation (12/09/14); Colonoscopy (12/09/14); and Esophagogastroduodenoscopy (12/09/14).   Her family history includes Alzheimer's disease in her sister; Cancer in her brother and father; Diabetes in her brother and sister; Hyperlipidemia in an other family member; Hypertension in her sister; Liver cancer in her father and mother.She reports that she quit smoking about 19 years ago. Her smoking use included cigarettes. She has a 20.00 pack-year smoking history. She has never used smokeless tobacco. She reports that she does not drink alcohol or use drugs.  Current Outpatient Medications on File Prior to Visit  Medication Sig Dispense Refill   amLODipine (NORVASC) 5 MG tablet Take 1 tablet (5 mg total) by mouth daily. 30 tablet 2    Blood Glucose Monitoring Suppl (ONETOUCH VERIO) w/Device KIT Use as directed once a day.  Dx code: E11.9 1 kit 0   glucose blood test strip Use as instructed once a day.  Dx Code: E11.9 100 each 1   hydrochlorothiazide (HYDRODIURIL) 25 MG tablet Take 1 tablet (25 mg total) by mouth daily. 90 tablet 3   Lancets MISC Use once a day.  Dx Code: E11.9 100 each 1   meloxicam (MOBIC) 15 MG tablet TAKE 1 TABLET BY MOUTH EVERY DAY AS NEEDED FOR PAIN 30 tablet 2   metFORMIN (GLUCOPHAGE XR) 500 MG 24 hr tablet Take 1 tablet (500 mg total) by mouth daily with breakfast. 30 tablet 2   Pitavastatin Calcium (LIVALO) 2 MG TABS 1 po qhs 30 tablet 2   No current facility-administered medications on file prior to visit.      Objective:  Objective  Physical Exam Vitals signs and nursing note reviewed.  Constitutional:      Appearance: She is well-developed.  HENT:     Head: Normocephalic and atraumatic.     Right Ear: There is impacted cerumen.     Left Ear: There is impacted cerumen.     Ears:   Eyes:     Conjunctiva/sclera: Conjunctivae normal.  Neck:     Musculoskeletal: Normal range of motion and neck supple.     Thyroid: No thyromegaly.     Vascular: No carotid bruit or JVD.  Cardiovascular:     Rate and Rhythm: Normal rate and regular rhythm.     Heart sounds: Normal heart sounds. No murmur.  Pulmonary:     Effort: Pulmonary effort is normal. No respiratory distress.  Breath sounds: Normal breath sounds. No wheezing or rales.  Chest:     Chest wall: No tenderness.  Neurological:     Mental Status: She is alert and oriented to person, place, and time.    BP (!) 154/86 (BP Location: Left Arm, Patient Position: Sitting, Cuff Size: Normal)    Pulse 98    Temp 98.3 F (36.8 C) (Oral)    Resp 18    Ht 5' (1.524 m)    Wt 209 lb 12.8 oz (95.2 kg)    LMP 03/17/2014    SpO2 95%    BMI 40.97 kg/m  Wt Readings from Last 3 Encounters:  09/16/18 209 lb 12.8 oz (95.2 kg)  06/02/18 211 lb  12.8 oz (96.1 kg)  05/08/18 213 lb 6.4 oz (96.8 kg)     Lab Results  Component Value Date   WBC 4.4 12/16/2013   HGB 12.2 12/16/2013   HCT 36.7 12/16/2013   PLT 279.0 12/16/2013   GLUCOSE 146 (H) 06/02/2018   CHOL 268 (H) 04/28/2018   TRIG 112.0 04/28/2018   HDL 50.90 04/28/2018   LDLDIRECT 230.7 03/18/2013   LDLCALC 195 (H) 04/28/2018   ALT 8 06/02/2018   AST 13 06/02/2018   NA 137 06/02/2018   K 4.1 06/02/2018   CL 103 06/02/2018   CREATININE 0.99 06/02/2018   BUN 23 06/02/2018   CO2 24 06/02/2018   TSH 2.19 09/29/2009   HGBA1C 7.4 (H) 06/02/2018   MICROALBUR 2.2 (H) 06/02/2018    Dg Bone Density  Result Date: 05/08/2018 EXAM: DUAL X-RAY ABSORPTIOMETRY (DXA) FOR BONE MINERAL DENSITY IMPRESSION: Alferd Apa [place] CHASE Your patient Willia Genrich completed a BMD test on 05/08/2018 using the [place] (analysis version: 16.SP2) manufactured by [employer]. The following summarizes the results of our evaluation. PATIENT: Name: Angelique, Chevalier Patient ID: [id number] Birth Date: [birth year] Height: 60.0 in. Gender: Female Measured: 05/08/2018 Weight: 212.8 lbs. Indications: Advanced Age, African-American, Estrogen Deficiency, Post Menopausal, Previous Tobacco User Fractures: Treatments: ASSESSMENT: The BMD measured at Femur Neck Left is 0.832 g/cm2 with a T-score of -1.5. This patient is considered osteopenic according to [facility]) criteria. Scan quality was good. Site Region Measured Date Measured Age WHO YA BMD Classification T-score AP Spine L1-L4 05/08/2018 [age] Osteopenia -1.3 1.021 g/cm2 DualFemur Neck Left 05/08/2018 [age] years Osteopenia -1.5 0.832 g/cm2 Left Forearm Radius 33% 05/08/2018 83.2 Normal -1.0 0.788 g/cm2 World Health Organization [facility]) criteria for post-menopausal, Caucasian Women: Normal       T-score at or above -1 SD Osteopenia   T-score between -1 and -2.5 SD Osteoporosis T-score at or below -2.5 SD RECOMMENDATION: 1. All  patients should optimize calcium and vitamin D intake. 2. Consider FDA-approved medical therapies in postmenopausal women and men aged 37 years and older, based on the following: a. A hip or vertebral(clinical or morphometric) fracture. b. T-Score < -2.5 at the femoral neck or spine after appropriate evaluation to exclude secondary causes c. Low bone mass (T-score between -1.0 and -2.5 at the femoral neck or spine) and a 10 year probability of a hip fracture >3% or a 10 year probability of major osteoporosis-related fracture > 20% based on the [place]-adapted WHO algorithm d. Clinical judgement and/or patient preferences may indicate treatment for people with 10-year fracture probabilities above or below these levels FOLLOW-UP: Patients with diagnosis of osteoporosis or at high risk for fracture should have regular bone mineral density tests. For patients eligible for Medicare, routine testing  is allowed once every 2 years. The testing frequency can be increased to one year for patients who have rapidly progressing disease, those who are receiving or discontinuing medical therapy to restore bone mass, or have additional risk factors. I have reviewed this report and agree with the above findings. [facility] Patient: [name] Referring Physician: Rosalita Chessman CHASE Birth Date: 1932/09/25 [age] Patient ID: [id number] Height: 60.0 in. Weight: 212.8 lbs. Measured: 05/08/2018 10:37:42 AM (16 SP 2) Gender: Female Ethnicity: Black Analyzed: 05/08/2018 10:47:34 AM (16 SP 2) FRAX* 10-year Probability of Fracture Based on femoral neck BMD: DualFemur (Left) Major Osteoporotic Fracture: 5.3% Hip Fracture:                1.3% Population:                  [place] (Black) Risk Factors:                None *FRAX is a Materials engineer of the [employer] for Metabolic Bone Disease, a [place] (WHO) [facility]. ASSESSMENT: The probability of a major  osteoporotic fracture is 5.3% within the next ten years. The probability of a hip fracture is 1.3% within the next ten years. Electronically Signed   By: Lowella Grip III M.D.   On: 05/08/2018 10:23     Assessment & Plan:  Plan  I have changed Claudette T. Shearer's albuterol. I am also having her maintain her meloxicam, amLODipine, Pitavastatin Calcium, glucose blood, Lancets, OneTouch Verio, hydrochlorothiazide, metFORMIN, fluticasone, and methocarbamol.  Meds ordered this encounter  Medications   fluticasone (FLOVENT HFA) 110 MCG/ACT inhaler    Sig: Inhale 2 puffs into the lungs 2 (two) times daily.    Dispense:  1 Inhaler    Refill:  3   methocarbamol (ROBAXIN) 500 MG tablet    Sig: Take 1 tablet (500 mg total) by mouth every 8 (eight) hours as needed for muscle spasms.    Dispense:  30 tablet    Refill:  0   albuterol (VENTOLIN HFA) 108 (90 Base) MCG/ACT inhaler    Sig: Inhale 2 puffs into the lungs every 6 (six) hours as needed.    Dispense:  18 g    Refill:  2    Problem List Items Addressed This Visit      Unprioritized   Acute left-sided low back pain without sciatica   Relevant Medications   methocarbamol (ROBAXIN) 500 MG tablet   Asthma - Primary    Refill inhalers       Relevant Medications   fluticasone (FLOVENT HFA) 110 MCG/ACT inhaler   albuterol (VENTOLIN HFA) 108 (90 Base) MCG/ACT inhaler   Impacted cerumen of both ears    Pt will use debrox and rto on Thursday for irrigation        Other Visit Diagnoses    Uncomplicated asthma       Relevant Medications   fluticasone (FLOVENT HFA) 110 MCG/ACT inhaler   albuterol (VENTOLIN HFA) 108 (90 Base) MCG/ACT inhaler      Follow-up: Return if symptoms worsen or fail to improve.  Ann Held, DO

## 2018-09-18 ENCOUNTER — Other Ambulatory Visit: Payer: Self-pay

## 2018-09-18 ENCOUNTER — Ambulatory Visit (INDEPENDENT_AMBULATORY_CARE_PROVIDER_SITE_OTHER): Payer: Medicare Other | Admitting: Family Medicine

## 2018-09-18 DIAGNOSIS — H6123 Impacted cerumen, bilateral: Secondary | ICD-10-CM | POA: Diagnosis not present

## 2018-09-18 NOTE — Patient Instructions (Signed)
Earwax Buildup, Adult The ears produce a substance called earwax that helps keep bacteria out of the ear and protects the skin in the ear canal. Occasionally, earwax can build up in the ear and cause discomfort or hearing loss. What increases the risk? This condition is more likely to develop in people who:  Are female.  Are elderly.  Naturally produce more earwax.  Clean their ears often with cotton swabs.  Use earplugs often.  Use in-ear headphones often.  Wear hearing aids.  Have narrow ear canals.  Have earwax that is overly thick or sticky.  Have eczema.  Are dehydrated.  Have excess hair in the ear canal. What are the signs or symptoms? Symptoms of this condition include:  Reduced or muffled hearing.  A feeling of fullness in the ear or feeling that the ear is plugged.  Fluid coming from the ear.  Ear pain.  Ear itch.  Ringing in the ear.  Coughing.  An obvious piece of earwax that can be seen inside the ear canal. How is this diagnosed? This condition may be diagnosed based on:  Your symptoms.  Your medical history.  An ear exam. During the exam, your health care provider will look into your ear with an instrument called an otoscope. You may have tests, including a hearing test. How is this treated? This condition may be treated by:  Using ear drops to soften the earwax.  Having the earwax removed by a health care provider. The health care provider may: ? Flush the ear with water. ? Use an instrument that has a loop on the end (curette). ? Use a suction device.  Surgery to remove the wax buildup. This may be done in severe cases. Follow these instructions at home:   Take over-the-counter and prescription medicines only as told by your health care provider.  Do not put any objects, including cotton swabs, into your ear. You can clean the opening of your ear canal with a washcloth or facial tissue.  Follow instructions from your health care  provider about cleaning your ears. Do not over-clean your ears.  Drink enough fluid to keep your urine clear or pale yellow. This will help to thin the earwax.  Keep all follow-up visits as told by your health care provider. If earwax builds up in your ears often or if you use hearing aids, consider seeing your health care provider for routine, preventive ear cleanings. Ask your health care provider how often you should schedule your cleanings.  If you have hearing aids, clean them according to instructions from the manufacturer and your health care provider. Contact a health care provider if:  You have ear pain.  You develop a fever.  You have blood, pus, or other fluid coming from your ear.  You have hearing loss.  You have ringing in your ears that does not go away.  Your symptoms do not improve with treatment.  You feel like the room is spinning (vertigo). Summary  Earwax can build up in the ear and cause discomfort or hearing loss.  The most common symptoms of this condition include reduced or muffled hearing and a feeling of fullness in the ear or feeling that the ear is plugged.  This condition may be diagnosed based on your symptoms, your medical history, and an ear exam.  This condition may be treated by using ear drops to soften the earwax or by having the earwax removed by a health care provider.  Do not put any   objects, including cotton swabs, into your ear. You can clean the opening of your ear canal with a washcloth or facial tissue. This information is not intended to replace advice given to you by your health care provider. Make sure you discuss any questions you have with your health care provider. Document Released: 04/12/2004 Document Revised: 02/15/2017 Document Reviewed: 05/16/2016 Elsevier Patient Education  2020 [employer]  

## 2018-09-18 NOTE — Progress Notes (Signed)
Patient ID: Felicia Martin, female    DOB: Apr 23, 1932  Age: 85 y.o. MRN: [id number]    Subjective:  Subjective  HPI Felicia Martin presents for irrigation ears.  She used the debrox for the last 2 days   Review of Systems  Constitutional: Negative for appetite change, diaphoresis, fatigue and unexpected weight change.  HENT: Positive for ear pain.   Eyes: Negative for pain, redness and visual disturbance.  Respiratory: Negative for cough, chest tightness, shortness of breath and wheezing.   Cardiovascular: Negative for chest pain, palpitations and leg swelling.  Endocrine: Negative for cold intolerance, heat intolerance, polydipsia, polyphagia and polyuria.  Genitourinary: Negative for difficulty urinating, dysuria and frequency.  Neurological: Negative for dizziness, light-headedness, numbness and headaches.    History Past Medical History:  Diagnosis Date   Asthma    GERD (gastroesophageal reflux disease)    Hypercholesterolemia    Hypertension    Pulmonary fibrosis ([place])    Sarcoidosis    Surgical history of tubal ligation 1960s    She has a past surgical history that includes Lung surgery (1960); Tubal ligation (1960); Maloney dilation (12/09/14); Colonoscopy (12/09/14); and Esophagogastroduodenoscopy (12/09/14).   Her family history includes Alzheimer's disease in her sister; Cancer in her brother and father; Diabetes in her brother and sister; Hyperlipidemia in an other family member; Hypertension in her sister; Liver cancer in her father and mother.She reports that she quit smoking about 19 years ago. Her smoking use included cigarettes. She has a 20.00 pack-year smoking history. She has never used smokeless tobacco. She reports that she does not drink alcohol or use drugs.  Current Outpatient Medications on File Prior to Visit  Medication Sig Dispense Refill   albuterol (VENTOLIN HFA) 108 (90 Base) MCG/ACT inhaler Inhale 2 puffs into the lungs every 6 (six)  hours as needed. 18 g 2   amLODipine (NORVASC) 5 MG tablet Take 1 tablet (5 mg total) by mouth daily. 30 tablet 2   Blood Glucose Monitoring Suppl (ONETOUCH VERIO) w/Device KIT Use as directed once a day.  Dx code: E11.9 1 kit 0   fluticasone (FLOVENT HFA) 110 MCG/ACT inhaler Inhale 2 puffs into the lungs 2 (two) times daily. 1 Inhaler 3   glucose blood test strip Use as instructed once a day.  Dx Code: E11.9 100 each 1   hydrochlorothiazide (HYDRODIURIL) 25 MG tablet Take 1 tablet (25 mg total) by mouth daily. 90 tablet 3   Lancets MISC Use once a day.  Dx Code: E11.9 100 each 1   meloxicam (MOBIC) 15 MG tablet TAKE 1 TABLET BY MOUTH EVERY DAY AS NEEDED FOR PAIN 30 tablet 2   metFORMIN (GLUCOPHAGE XR) 500 MG 24 hr tablet Take 1 tablet (500 mg total) by mouth daily with breakfast. 30 tablet 2   methocarbamol (ROBAXIN) 500 MG tablet Take 1 tablet (500 mg total) by mouth every 8 (eight) hours as needed for muscle spasms. 30 tablet 0   Pitavastatin Calcium (LIVALO) 2 MG TABS 1 po qhs 30 tablet 2   No current facility-administered medications on file prior to visit.      Objective:  Objective  Physical Exam Vitals signs and nursing note reviewed.  HENT:     Right Ear: There is impacted cerumen.     Left Ear: There is impacted cerumen.     Ears:     Comments: Unable to irrigate without a lot of discomfort     BP (!) 150/76 (BP Location: Left Arm, Patient  Position: Sitting, Cuff Size: Large)    Pulse 100    Resp 18    Ht 5' (1.524 m)    Wt 208 lb 9.6 oz (94.6 kg)    LMP 03/17/2014    SpO2 98%    BMI 40.74 kg/m  Wt Readings from Last 3 Encounters:  09/18/18 208 lb 9.6 oz (94.6 kg)  09/16/18 209 lb 12.8 oz (95.2 kg)  06/02/18 211 lb 12.8 oz (96.1 kg)     Lab Results  Component Value Date   WBC 4.4 12/16/2013   HGB 12.2 12/16/2013   HCT 36.7 12/16/2013   PLT 279.0 12/16/2013   GLUCOSE 146 (H) 06/02/2018   CHOL 268 (H) 04/28/2018   TRIG 112.0 04/28/2018   HDL 50.90  04/28/2018   LDLDIRECT 230.7 03/18/2013   LDLCALC 195 (H) 04/28/2018   ALT 8 06/02/2018   AST 13 06/02/2018   NA 137 06/02/2018   K 4.1 06/02/2018   CL 103 06/02/2018   CREATININE 0.99 06/02/2018   BUN 23 06/02/2018   CO2 24 06/02/2018   TSH 2.19 09/29/2009   HGBA1C 7.4 (H) 06/02/2018   MICROALBUR 2.2 (H) 06/02/2018    Dg Bone Density  Result Date: 05/08/2018 EXAM: DUAL X-RAY ABSORPTIOMETRY (DXA) FOR BONE MINERAL DENSITY IMPRESSION: Felicia Martin completed a BMD test on 05/08/2018 using the [place] (analysis version: 16.SP2) manufactured by [employer]. The following summarizes the results of our evaluation. PATIENT: Name: Felicia Martin, Felicia Martin Patient ID: [id number] Birth Date: 07-15-1932 Height: 60.0 in. Gender: Female Measured: 05/08/2018 Weight: 212.8 lbs. Indications: Advanced Age, African-American, Estrogen Deficiency, Post Menopausal, Previous Tobacco User Fractures: Treatments: ASSESSMENT: The BMD measured at Femur Neck Left is 0.832 g/cm2 with a T-score of -1.5. This patient is considered osteopenic according to [facility]) criteria. Scan quality was good. Site Region Measured Date Measured Age WHO YA BMD Classification T-score AP Spine L1-L4 05/08/2018 [age] Osteopenia -1.3 1.021 g/cm2 DualFemur Neck Left 05/08/2018 [age] Osteopenia -1.5 0.832 g/cm2 Left Forearm Radius 33% 05/08/2018 83.2 Normal -1.0 0.788 g/cm2 World Health Organization [facility]) criteria for post-menopausal, Caucasian Women: Normal       T-score at or above -1 SD Osteopenia   T-score between -1 and -2.5 SD Osteoporosis T-score at or below -2.5 SD RECOMMENDATION: 1. All patients should optimize calcium and vitamin D intake. 2. Consider FDA-approved medical therapies in postmenopausal women and men aged 1 years and older, based on the following: a. A hip or vertebral(clinical or morphometric) fracture. b. T-Score < -2.5 at the femoral neck or spine  after appropriate evaluation to exclude secondary causes c. Low bone mass (T-score between -1.0 and -2.5 at the femoral neck or spine) and a 10 year probability of a hip fracture >3% or a 10 year probability of major osteoporosis-related fracture > 20% based on the [place]-adapted WHO algorithm d. Clinical judgement and/or patient preferences may indicate treatment for people with 10-year fracture probabilities above or below these levels FOLLOW-UP: Patients with diagnosis of osteoporosis or at high risk for fracture should have regular bone mineral density tests. For patients eligible for Medicare, routine testing is allowed once every 2 years. The testing frequency can be increased to one year for patients who have rapidly progressing disease, those who are receiving or discontinuing medical therapy to restore bone mass, or have additional risk factors. I have reviewed this report and agree with the above findings. [facility] Patient: Felicia Martin, Felicia Martin Referring  Physician: Rosalita Chessman CHASE Birth Date: June 01, 1932 [age] Patient ID: [id number] Height: 60.0 in. Weight: 212.8 lbs. Measured: 05/08/2018 10:37:42 AM (16 SP 2) Gender: Female Ethnicity: Black Analyzed: 05/08/2018 10:47:34 AM (16 SP 2) FRAX* 10-year Probability of Fracture Based on femoral neck BMD: DualFemur (Left) Major Osteoporotic Fracture: 5.3% Hip Fracture:                1.3% Population:                  [place] (Black) Risk Factors:                None *FRAX is a Materials engineer of the [employer] for Metabolic Bone Disease, a [place] (WHO) [facility]. ASSESSMENT: The probability of a major osteoporotic fracture is 5.3% within the next ten years. The probability of a hip fracture is 1.3% within the next ten years. Electronically Signed   By: Lowella Grip III M.D.   On: 05/08/2018 10:23     Assessment & Plan:  Plan  I am having Felicia Martin maintain her  meloxicam, amLODipine, Pitavastatin Calcium, glucose blood, Lancets, OneTouch Verio, hydrochlorothiazide, metFORMIN, fluticasone, methocarbamol, and albuterol.  No orders of the defined types were placed in this encounter.   Problem List Items Addressed This Visit      Unprioritized   Cerumen impaction    Use debrox for a few weeks and rto for [place] to attempt irrigation again  Pt did not want to go to ent          Follow-up: Return in about 4 weeks (around 10/16/2018), or if symptoms worsen or fail to improve.  Ann Held, DO

## 2018-09-21 ENCOUNTER — Encounter: Payer: Self-pay | Admitting: Family Medicine

## 2018-09-21 DIAGNOSIS — H6120 Impacted cerumen, unspecified ear: Secondary | ICD-10-CM | POA: Insufficient documentation

## 2018-09-21 NOTE — Assessment & Plan Note (Addendum)
Use debrox for a few weeks and rto for [place] to attempt irrigation again  Pt did not want to go to ent

## 2018-10-16 ENCOUNTER — Other Ambulatory Visit: Payer: Self-pay

## 2018-10-16 ENCOUNTER — Ambulatory Visit (INDEPENDENT_AMBULATORY_CARE_PROVIDER_SITE_OTHER): Payer: Medicare Other | Admitting: Family Medicine

## 2018-10-16 ENCOUNTER — Encounter: Payer: Self-pay | Admitting: Family Medicine

## 2018-10-16 VITALS — BP 185/100 | HR 74 | Temp 98.0°F | Resp 18 | Ht 60.0 in | Wt 206.8 lb

## 2018-10-16 DIAGNOSIS — R600 Localized edema: Secondary | ICD-10-CM | POA: Diagnosis not present

## 2018-10-16 DIAGNOSIS — H6123 Impacted cerumen, bilateral: Secondary | ICD-10-CM | POA: Diagnosis not present

## 2018-10-16 DIAGNOSIS — E1151 Type 2 diabetes mellitus with diabetic peripheral angiopathy without gangrene: Secondary | ICD-10-CM | POA: Diagnosis not present

## 2018-10-16 DIAGNOSIS — E1165 Type 2 diabetes mellitus with hyperglycemia: Secondary | ICD-10-CM

## 2018-10-16 DIAGNOSIS — H60503 Unspecified acute noninfective otitis externa, bilateral: Secondary | ICD-10-CM

## 2018-10-16 DIAGNOSIS — I10 Essential (primary) hypertension: Secondary | ICD-10-CM

## 2018-10-16 DIAGNOSIS — IMO0002 Reserved for concepts with insufficient information to code with codable children: Secondary | ICD-10-CM

## 2018-10-16 DIAGNOSIS — E785 Hyperlipidemia, unspecified: Secondary | ICD-10-CM

## 2018-10-16 DIAGNOSIS — E1169 Type 2 diabetes mellitus with other specified complication: Secondary | ICD-10-CM

## 2018-10-16 MED ORDER — HYDROCHLOROTHIAZIDE 25 MG PO TABS: 25.0000 mg | ORAL_TABLET | Freq: Every day | ORAL | 3 refills | Status: DC

## 2018-10-16 MED ORDER — AMLODIPINE BESYLATE 5 MG PO TABS: 5.0000 mg | ORAL_TABLET | Freq: Every day | ORAL | 1 refills | Status: DC

## 2018-10-16 MED ORDER — OFLOXACIN 0.3 % OT SOLN: 10.0000 [drp] | Freq: Every day | OTIC | 1 refills | Status: DC

## 2018-10-16 NOTE — Assessment & Plan Note (Addendum)
Poorly controlled will alter medications, encouraged DASH diet, minimize caffeine and obtain adequate sleep. Report concerning symptoms and follow up as directed and as needed--- pt has not taken her meds She will start taking them immediately and f/u 2-3 weeks

## 2018-10-16 NOTE — Patient Instructions (Signed)
Earwax Buildup, Adult The ears produce a substance called earwax that helps keep bacteria out of the ear and protects the skin in the ear canal. Occasionally, earwax can build up in the ear and cause discomfort or hearing loss. What increases the risk? This condition is more likely to develop in people who:  Are female.  Are elderly.  Naturally produce more earwax.  Clean their ears often with cotton swabs.  Use earplugs often.  Use in-ear headphones often.  Wear hearing aids.  Have narrow ear canals.  Have earwax that is overly thick or sticky.  Have eczema.  Are dehydrated.  Have excess hair in the ear canal. What are the signs or symptoms? Symptoms of this condition include:  Reduced or muffled hearing.  A feeling of fullness in the ear or feeling that the ear is plugged.  Fluid coming from the ear.  Ear pain.  Ear itch.  Ringing in the ear.  Coughing.  An obvious piece of earwax that can be seen inside the ear canal. How is this diagnosed? This condition may be diagnosed based on:  Your symptoms.  Your medical history.  An ear exam. During the exam, your health care provider will look into your ear with an instrument called an otoscope. You may have tests, including a hearing test. How is this treated? This condition may be treated by:  Using ear drops to soften the earwax.  Having the earwax removed by a health care provider. The health care provider may: ? Flush the ear with water. ? Use an instrument that has a loop on the end (curette). ? Use a suction device.  Surgery to remove the wax buildup. This may be done in severe cases. Follow these instructions at home:   Take over-the-counter and prescription medicines only as told by your health care provider.  Do not put any objects, including cotton swabs, into your ear. You can clean the opening of your ear canal with a washcloth or facial tissue.  Follow instructions from your health care  provider about cleaning your ears. Do not over-clean your ears.  Drink enough fluid to keep your urine clear or pale yellow. This will help to thin the earwax.  Keep all follow-up visits as told by your health care provider. If earwax builds up in your ears often or if you use hearing aids, consider seeing your health care provider for routine, preventive ear cleanings. Ask your health care provider how often you should schedule your cleanings.  If you have hearing aids, clean them according to instructions from the manufacturer and your health care provider. Contact a health care provider if:  You have ear pain.  You develop a fever.  You have blood, pus, or other fluid coming from your ear.  You have hearing loss.  You have ringing in your ears that does not go away.  Your symptoms do not improve with treatment.  You feel like the room is spinning (vertigo). Summary  Earwax can build up in the ear and cause discomfort or hearing loss.  The most common symptoms of this condition include reduced or muffled hearing and a feeling of fullness in the ear or feeling that the ear is plugged.  This condition may be diagnosed based on your symptoms, your medical history, and an ear exam.  This condition may be treated by using ear drops to soften the earwax or by having the earwax removed by a health care provider.  Do not put any   objects, including cotton swabs, into your ear. You can clean the opening of your ear canal with a washcloth or facial tissue. This information is not intended to replace advice given to you by your health care provider. Make sure you discuss any questions you have with your health care provider. Document Released: 04/12/2004 Document Revised: 02/15/2017 Document Reviewed: 05/16/2016 Elsevier Patient Education  2020 [employer]  

## 2018-10-16 NOTE — Assessment & Plan Note (Signed)
floxin gtts Refer to ent for cerumen impaction

## 2018-10-16 NOTE — Addendum Note (Signed)
Addended by: Kelle Darting A on: 10/16/2018 01:55 PM   Modules accepted: Orders

## 2018-10-16 NOTE — Progress Notes (Signed)
Patient ID: Felicia Martin, female    DOB: November 11, 1932  Age: 85 y.o. MRN: [id number]    Subjective:  Subjective  HPI Felicia Martin presents for f/u cerumen impaction  She has been using debrox.  She has not taken her bp meds in a week.   Pt has no other complaints    Review of Systems  Constitutional: Negative for appetite change, diaphoresis, fatigue and unexpected weight change.  Eyes: Negative for pain, redness and visual disturbance.  Respiratory: Negative for cough, chest tightness, shortness of breath and wheezing.   Cardiovascular: Negative for chest pain, palpitations and leg swelling.  Endocrine: Negative for cold intolerance, heat intolerance, polydipsia, polyphagia and polyuria.  Genitourinary: Negative for difficulty urinating, dysuria and frequency.  Neurological: Negative for dizziness, light-headedness, numbness and headaches.    History Past Medical History:  Diagnosis Date  . Asthma   . GERD (gastroesophageal reflux disease)   . Hypercholesterolemia   . Hypertension   . Pulmonary fibrosis ([place])   . Sarcoidosis   . Surgical history of tubal ligation 1960s    She has a past surgical history that includes Lung surgery (1960); Tubal ligation (1960); Maloney dilation (12/09/14); Colonoscopy (12/09/14); and Esophagogastroduodenoscopy (12/09/14).   Her family history includes Alzheimer's disease in her sister; Cancer in her brother and father; Diabetes in her brother and sister; Hyperlipidemia in an other family member; Hypertension in her sister; Liver cancer in her father and mother.She reports that she quit smoking about 19 years ago. Her smoking use included cigarettes. She has a 20.00 pack-year smoking history. She has never used smokeless tobacco. She reports that she does not drink alcohol or use drugs.  Current Outpatient Medications on File Prior to Visit  Medication Sig Dispense Refill  . albuterol (VENTOLIN HFA) 108 (90 Base) MCG/ACT inhaler Inhale 2 puffs  into the lungs every 6 (six) hours as needed. 18 g 2  . Blood Glucose Monitoring Suppl (ONETOUCH VERIO) w/Device KIT Use as directed once a day.  Dx code: E11.9 1 kit 0  . fluticasone (FLOVENT HFA) 110 MCG/ACT inhaler Inhale 2 puffs into the lungs 2 (two) times daily. 1 Inhaler 3  . glucose blood test strip Use as instructed once a day.  Dx Code: E11.9 100 each 1  . Lancets MISC Use once a day.  Dx Code: E11.9 100 each 1  . meloxicam (MOBIC) 15 MG tablet TAKE 1 TABLET BY MOUTH EVERY DAY AS NEEDED FOR PAIN 30 tablet 2  . metFORMIN (GLUCOPHAGE XR) 500 MG 24 hr tablet Take 1 tablet (500 mg total) by mouth daily with breakfast. 30 tablet 2  . methocarbamol (ROBAXIN) 500 MG tablet Take 1 tablet (500 mg total) by mouth every 8 (eight) hours as needed for muscle spasms. 30 tablet 0  . Pitavastatin Calcium (LIVALO) 2 MG TABS 1 po qhs 30 tablet 2   No current facility-administered medications on file prior to visit.      Objective:  Objective  Physical Exam Vitals signs and nursing note reviewed.  Constitutional:      Appearance: She is well-developed.  HENT:     Head: Normocephalic and atraumatic.     Right Ear: There is impacted cerumen.     Left Ear: There is impacted cerumen.     Ears:   Eyes:     Conjunctiva/sclera: Conjunctivae normal.  Neck:     Musculoskeletal: Normal range of motion and neck supple.     Thyroid: No thyromegaly.  Vascular: No carotid bruit or JVD.  Cardiovascular:     Rate and Rhythm: Normal rate and regular rhythm.     Heart sounds: Normal heart sounds. No murmur.  Pulmonary:     Effort: Pulmonary effort is normal. No respiratory distress.     Breath sounds: Normal breath sounds. No wheezing or rales.  Chest:     Chest wall: No tenderness.  Neurological:     Mental Status: She is alert and oriented to person, place, and time.    BP (!) 185/100 (BP Location: Right Arm, Patient Position: Sitting, Cuff Size: Normal)   Pulse 74   Temp 98 F (36.7 C)  (Oral)   Resp 18   Ht 5' (1.524 m)   Wt 206 lb 12.8 oz (93.8 kg)   LMP 03/17/2014   SpO2 98%   BMI 40.39 kg/m  Wt Readings from Last 3 Encounters:  10/16/18 206 lb 12.8 oz (93.8 kg)  09/18/18 208 lb 9.6 oz (94.6 kg)  09/16/18 209 lb 12.8 oz (95.2 kg)     Lab Results  Component Value Date   WBC 4.4 12/16/2013   HGB 12.2 12/16/2013   HCT 36.7 12/16/2013   PLT 279.0 12/16/2013   GLUCOSE 146 (H) 06/02/2018   CHOL 268 (H) 04/28/2018   TRIG 112.0 04/28/2018   HDL 50.90 04/28/2018   LDLDIRECT 230.7 03/18/2013   LDLCALC 195 (H) 04/28/2018   ALT 8 06/02/2018   AST 13 06/02/2018   NA 137 06/02/2018   K 4.1 06/02/2018   CL 103 06/02/2018   CREATININE 0.99 06/02/2018   BUN 23 06/02/2018   CO2 24 06/02/2018   TSH 2.19 09/29/2009   HGBA1C 7.4 (H) 06/02/2018   MICROALBUR 2.2 (H) 06/02/2018    Dg Bone Density  Result Date: 05/08/2018 EXAM: DUAL X-RAY ABSORPTIOMETRY (DXA) FOR BONE MINERAL DENSITY IMPRESSION: Felicia Martin completed a BMD test on 05/08/2018 using the [place] (analysis version: 16.SP2) manufactured by [employer]. The following summarizes the results of our evaluation. PATIENT: Name: Felicia Martin Patient ID: [id number] Birth Date: 04/20/1932 Height: 60.0 in. Gender: Female Measured: 05/08/2018 Weight: 212.8 lbs. Indications: Advanced Age, African-American, Estrogen Deficiency, Post Menopausal, Previous Tobacco User Fractures: Treatments: ASSESSMENT: The BMD measured at Femur Neck Left is 0.832 g/cm2 with a T-score of -1.5. This patient is considered osteopenic according to [facility]) criteria. Scan quality was good. Site Region Measured Date Measured Age WHO YA BMD Classification T-score AP Spine L1-L4 05/08/[age] Osteopenia -1.3 1.021 g/cm2 DualFemur Neck Left 05/08/2018 83.4 years Osteopenia -1.5 0.832 g/cm2 Left Forearm Radius 33% 05/08/2018 83.2 Normal -1.0 0.788 g/cm2 World Health  Organization [facility]) criteria for post-menopausal, Caucasian Women: Normal       T-score at or above -1 SD Osteopenia   T-score between -1 and -2.5 SD Osteoporosis T-score at or below -2.5 SD RECOMMENDATION: 1. All patients should optimize calcium and vitamin D intake. 2. Consider FDA-approved medical therapies in postmenopausal women and men aged 110 years and older, based on the following: a. A hip or vertebral(clinical or morphometric) fracture. b. T-Score < -2.5 at the femoral neck or spine after appropriate evaluation to exclude secondary causes c. Low bone mass (T-score between -1.0 and -2.5 at the femoral neck or spine) and a 10 year probability of a hip fracture >3% or a 10 year probability of major osteoporosis-related fracture > 20% based on the [place]-adapted WHO algorithm d. Clinical judgement and/or patient  preferences may indicate treatment for people with 10-year fracture probabilities above or below these levels FOLLOW-UP: Patients with diagnosis of osteoporosis or at high risk for fracture should have regular bone mineral density tests. For patients eligible for Medicare, routine testing is allowed once every 2 years. The testing frequency can be increased to one year for patients who have rapidly progressing disease, those who are receiving or discontinuing medical therapy to restore bone mass, or have additional risk factors. I have reviewed this report and agree with the above findings. [facility] Patient: [name] Referring Physician: Rosalita Chessman Martin Birth Date: November 20, 1932 [age] Patient ID: [id number] Height: 60.0 in. Weight: 212.8 lbs. Measured: 05/08/2018 10:37:42 AM (16 SP 2) Gender: Female Ethnicity: Black Analyzed: 05/08/2018 10:47:34 AM (16 SP 2) FRAX* 10-year Probability of Fracture Based on femoral neck BMD: DualFemur (Left) Major Osteoporotic Fracture: 5.3% Hip Fracture:                1.3% Population:                  [place] (Black) Risk Factors:                 None *FRAX is a Materials engineer of the [employer] for Metabolic Bone Disease, a [place] (WHO) [facility]. ASSESSMENT: The probability of a major osteoporotic fracture is 5.3% within the next ten years. The probability of a hip fracture is 1.3% within the next ten years. Electronically Signed   By: Lowella Grip III M.D.   On: 05/08/2018 10:23     Assessment & Plan:  Plan  I am having Dyllan T. Miracle start on ofloxacin. I am also having her maintain her meloxicam, Pitavastatin Calcium, glucose blood, Lancets, OneTouch Verio, metFORMIN, fluticasone, methocarbamol, albuterol, hydrochlorothiazide, and amLODipine.  Meds ordered this encounter  Medications  . hydrochlorothiazide (HYDRODIURIL) 25 MG tablet    Sig: Take 1 tablet (25 mg total) by mouth daily.    Dispense:  90 tablet    Refill:  3  . amLODipine (NORVASC) 5 MG tablet    Sig: Take 1 tablet (5 mg total) by mouth daily.    Dispense:  90 tablet    Refill:  1  . ofloxacin (FLOXIN OTIC) 0.3 % OTIC solution    Sig: Place 10 drops into both ears daily.    Dispense:  10 mL    Refill:  1    Problem List Items Addressed This Visit      Unprioritized   Cerumen impaction   Relevant Orders   Ambulatory referral to ENT   DM (diabetes mellitus) type II uncontrolled, periph vascular disorder ([place])   Relevant Medications   hydrochlorothiazide (HYDRODIURIL) 25 MG tablet   amLODipine (NORVASC) 5 MG tablet   Essential hypertension    Poorly controlled will alter medications, encouraged DASH diet, minimize caffeine and obtain adequate sleep. Report concerning symptoms and follow up as directed and as needed--- pt has not taken her meds She will start taking them immediately and f/u 2-3 weeks       Relevant Medications   hydrochlorothiazide (HYDRODIURIL) 25 MG tablet   amLODipine (NORVASC) 5 MG tablet   Impacted cerumen of both ears    floxin gtts Refer to ent for  cerumen impaction       Lower extremity edema   Relevant Medications   hydrochlorothiazide (HYDRODIURIL) 25 MG tablet    Other  Visit Diagnoses    Acute otitis externa of both ears, unspecified type    -  Primary   Relevant Medications   ofloxacin (FLOXIN OTIC) 0.3 % OTIC solution   Hyperlipidemia associated with type 2 diabetes mellitus ([place])          Follow-up: Return if symptoms worsen or fail to improve.  Ann Held, DO

## 2018-10-30 ENCOUNTER — Encounter: Payer: Self-pay | Admitting: Family Medicine

## 2018-10-30 ENCOUNTER — Ambulatory Visit (INDEPENDENT_AMBULATORY_CARE_PROVIDER_SITE_OTHER): Payer: Medicare Other | Admitting: Family Medicine

## 2018-10-30 ENCOUNTER — Other Ambulatory Visit: Payer: Self-pay

## 2018-10-30 DIAGNOSIS — E785 Hyperlipidemia, unspecified: Secondary | ICD-10-CM

## 2018-10-30 DIAGNOSIS — E1169 Type 2 diabetes mellitus with other specified complication: Secondary | ICD-10-CM | POA: Diagnosis not present

## 2018-10-30 DIAGNOSIS — E1165 Type 2 diabetes mellitus with hyperglycemia: Secondary | ICD-10-CM

## 2018-10-30 DIAGNOSIS — IMO0002 Reserved for concepts with insufficient information to code with codable children: Secondary | ICD-10-CM

## 2018-10-30 DIAGNOSIS — I10 Essential (primary) hypertension: Secondary | ICD-10-CM | POA: Diagnosis not present

## 2018-10-30 DIAGNOSIS — E1151 Type 2 diabetes mellitus with diabetic peripheral angiopathy without gangrene: Secondary | ICD-10-CM

## 2018-10-30 DIAGNOSIS — H6123 Impacted cerumen, bilateral: Secondary | ICD-10-CM | POA: Diagnosis not present

## 2018-10-30 LAB — MICROALBUMIN / CREATININE URINE RATIO
Creatinine,U: 159.4 mg/dL
Microalb Creat Ratio: 0.6 mg/g (ref 0.0–30.0)
Microalb, Ur: 1 mg/dL (ref 0.0–1.9)

## 2018-10-30 LAB — COMPREHENSIVE METABOLIC PANEL
ALT: 10 U/L (ref 0–35)
AST: 16 U/L (ref 0–37)
Albumin: 4.4 g/dL (ref 3.5–5.2)
Alkaline Phosphatase: 71 U/L (ref 39–117)
BUN: 24 mg/dL — ABNORMAL HIGH (ref 6–23)
CO2: 25 mEq/L (ref 19–32)
Calcium: 9.7 mg/dL (ref 8.4–10.5)
Chloride: 102 mEq/L (ref 96–112)
Creatinine, Ser: 0.85 mg/dL (ref 0.40–1.20)
GFR: 77.14 mL/min (ref >60.00)
Glucose, Bld: 107 mg/dL — ABNORMAL HIGH (ref 70–99)
Potassium: 4 mEq/L (ref 3.5–5.1)
Sodium: 137 mEq/L (ref 135–145)
Total Bilirubin: 0.5 mg/dL (ref 0.2–1.2)
Total Protein: 8.1 g/dL (ref 6.0–8.3)

## 2018-10-30 LAB — LIPID PANEL
Cholesterol: 302 mg/dL — ABNORMAL HIGH (ref 0–200)
HDL: 58.9 mg/dL (ref >39.00)
LDL Cholesterol: 220 mg/dL — ABNORMAL HIGH (ref 0–99)
NonHDL: 242.63
Total CHOL/HDL Ratio: 5
Triglycerides: 115 mg/dL (ref 0.0–149.0)
VLDL: 23 mg/dL (ref 0.0–40.0)

## 2018-10-30 LAB — HEMOGLOBIN A1C: Hgb A1c MFr Bld: 6.3 % (ref 4.6–6.5)

## 2018-10-30 MED ORDER — METFORMIN HCL ER 500 MG PO TB24: 500.0000 mg | ORAL_TABLET | Freq: Every day | ORAL | 1 refills | Status: DC

## 2018-10-30 NOTE — Assessment & Plan Note (Signed)
Well controlled, no changes to meds. Encouraged heart healthy diet such as the DASH diet and exercise as tolerated.  °

## 2018-10-30 NOTE — Patient Instructions (Signed)

## 2018-10-30 NOTE — Assessment & Plan Note (Signed)
F/u ent  

## 2018-10-30 NOTE — Progress Notes (Signed)
Martin ID: Felicia Martin, female    DOB: 04/19/1932  Age: 85 y.o. MRN: [id number]    Subjective:  Subjective  HPI Felicia Martin presents for f/u bp.  Pt with no other complaints.    Review of Systems  Constitutional: Negative for appetite change, diaphoresis, fatigue and unexpected weight change.  Eyes: Negative for pain, discharge, redness and visual disturbance.  Respiratory: Negative for cough, chest tightness, shortness of breath and wheezing.   Cardiovascular: Negative for chest pain, palpitations and leg swelling.  Endocrine: Negative for cold intolerance, heat intolerance, polydipsia, polyphagia and polyuria.  Genitourinary: Negative for difficulty urinating, dysuria and frequency.  Neurological: Negative for dizziness, light-headedness, numbness and headaches.    History Past Medical History:  Diagnosis Date   Asthma    GERD (gastroesophageal reflux disease)    Hypercholesterolemia    Hypertension    Pulmonary fibrosis ([place])    Sarcoidosis    Surgical history of tubal ligation 1960s    She has a past surgical history that includes Lung surgery (1960); Tubal ligation (1960); Maloney dilation (12/09/14); Colonoscopy (12/09/14); and Esophagogastroduodenoscopy (12/09/14).   Her family history includes Alzheimer's disease in her sister; Cancer in her brother and father; Diabetes in her brother and sister; Hyperlipidemia in an other family member; Hypertension in her sister; Liver cancer in her father and mother.She reports that she quit smoking about 19 years ago. Her smoking use included cigarettes. She has a 20.00 pack-year smoking history. She has never used smokeless tobacco. She reports that she does not drink alcohol or use drugs.  Current Outpatient Medications on File Prior to Visit  Medication Sig Dispense Refill   albuterol (VENTOLIN HFA) 108 (90 Base) MCG/ACT inhaler Inhale 2 puffs into the lungs every 6 (six) hours as needed. 18 g 2   amLODipine  (NORVASC) 5 MG tablet Take 1 tablet (5 mg total) by mouth daily. 90 tablet 1   Blood Glucose Monitoring Suppl (ONETOUCH VERIO) w/Device KIT Use as directed once a day.  Dx code: E11.9 1 kit 0   fluticasone (FLOVENT HFA) 110 MCG/ACT inhaler Inhale 2 puffs into the lungs 2 (two) times daily. 1 Inhaler 3   glucose blood test strip Use as instructed once a day.  Dx Code: E11.9 100 each 1   hydrochlorothiazide (HYDRODIURIL) 25 MG tablet Take 1 tablet (25 mg total) by mouth daily. 90 tablet 3   Lancets MISC Use once a day.  Dx Code: E11.9 100 each 1   meloxicam (MOBIC) 15 MG tablet TAKE 1 TABLET BY MOUTH EVERY DAY AS NEEDED FOR PAIN 30 tablet 2   methocarbamol (ROBAXIN) 500 MG tablet Take 1 tablet (500 mg total) by mouth every 8 (eight) hours as needed for muscle spasms. 30 tablet 0   ofloxacin (FLOXIN OTIC) 0.3 % OTIC solution Place 10 drops into both ears daily. 10 mL 1   Pitavastatin Calcium (LIVALO) 2 MG TABS 1 po qhs 30 tablet 2   No current facility-administered medications on file prior to visit.      Objective:  Objective  Physical Exam Vitals signs and nursing note reviewed.  Constitutional:      Appearance: She is well-developed.  HENT:     Head: Normocephalic and atraumatic.  Eyes:     Conjunctiva/sclera: Conjunctivae normal.  Neck:     Musculoskeletal: Normal range of motion and neck supple.     Thyroid: No thyromegaly.     Vascular: No carotid bruit or JVD.  Cardiovascular:  Rate and Rhythm: Normal rate and regular rhythm.     Heart sounds: Normal heart sounds. No murmur.  Pulmonary:     Effort: Pulmonary effort is normal. No respiratory distress.     Breath sounds: Normal breath sounds. No wheezing or rales.  Chest:     Chest wall: No tenderness.  Neurological:     Mental Status: She is alert and oriented to person, place, and time.    BP 138/82 (BP Location: Left Arm, Martin Position: Sitting, Cuff Size: Normal)    Pulse 75    Temp (!) 96.5 F (35.8 C)  (Oral)    Resp 20    Ht 5' (1.524 m)    Wt 205 lb 3.2 oz (93.1 kg)    LMP 03/17/2014    SpO2 98%    BMI 40.08 kg/m  Wt Readings from Last 3 Encounters:  10/30/18 205 lb 3.2 oz (93.1 kg)  10/16/18 206 lb 12.8 oz (93.8 kg)  09/18/18 208 lb 9.6 oz (94.6 kg)     Lab Results  Component Value Date   WBC 4.4 12/16/2013   HGB 12.2 12/16/2013   HCT 36.7 12/16/2013   PLT 279.0 12/16/2013   GLUCOSE 107 (H) 10/30/2018   CHOL 302 (H) 10/30/2018   TRIG 115.0 10/30/2018   HDL 58.90 10/30/2018   LDLDIRECT 230.7 03/18/2013   LDLCALC 220 (H) 10/30/2018   ALT 10 10/30/2018   AST 16 10/30/2018   NA 137 10/30/2018   K 4.0 10/30/2018   CL 102 10/30/2018   CREATININE 0.85 10/30/2018   BUN 24 (H) 10/30/2018   CO2 25 10/30/2018   TSH 2.19 09/29/2009   HGBA1C 6.3 10/30/2018   MICROALBUR 1.0 10/30/2018    Dg Bone Density  Result Date: 05/08/2018 EXAM: DUAL X-RAY ABSORPTIOMETRY (DXA) FOR BONE MINERAL DENSITY IMPRESSION: Felicia Martin Felicia Martin completed a BMD test on 05/08/2018 using the [place] (analysis version: 16.SP2) manufactured by [employer]. The following summarizes the results of our evaluation. Martin: Name: Felicia Martin Martin ID: [id number] Birth Date: September 23, 1932 Height: 60.0 in. Gender: Female Measured: 05/08/2018 Weight: 212.8 lbs. Indications: Advanced Age, African-American, Estrogen Deficiency, Post Menopausal, Previous Tobacco User Fractures: Treatments: ASSESSMENT: The BMD measured at Femur Neck Left is 0.832 g/cm2 with a T-score of -1.5. This Martin is considered osteopenic according to [facility]) criteria. Scan quality was good. Site Region Measured Date Measured Age WHO YA BMD Classification T-score AP Spine L1-L4 05/08/2018 83.4 Osteopenia -1.3 1.021 g/cm2 DualFemur Neck Left 05/08/2018 [age] years Osteopenia -1.5 0.832 g/cm2 Left Forearm Radius 33% 05/08/2018 83.2 Normal -1.0 0.788 g/cm2 World Health Organization  [facility]) criteria for post-menopausal, Caucasian Women: Normal       T-score at or above -1 SD Osteopenia   T-score between -1 and -2.5 SD Osteoporosis T-score at or below -2.5 SD RECOMMENDATION: 1. All patients should optimize calcium and vitamin D intake. 2. Consider FDA-approved medical therapies in postmenopausal women and men aged 69 years and older, based on the following: a. A hip or vertebral(clinical or morphometric) fracture. b. T-Score < -2.5 at the femoral neck or spine after appropriate evaluation to exclude secondary causes c. Low bone mass (T-score between -1.0 and -2.5 at the femoral neck or spine) and a 10 year probability of a hip fracture >3% or a 10 year probability of major osteoporosis-related fracture > 20% based on the [place]-adapted WHO algorithm d. Clinical judgement and/or Martin preferences may indicate treatment for  people with 10-year fracture probabilities above or below these levels FOLLOW-UP: Patients with diagnosis of osteoporosis or at high risk for fracture should have regular bone mineral density tests. For patients eligible for Medicare, routine testing is allowed once every 2 years. The testing frequency can be increased to one year for patients who have rapidly progressing disease, those who are receiving or discontinuing medical therapy to restore bone mass, or have additional risk factors. I have reviewed this report and agree with the above findings. [facility] Martin: [name] Referring Physician: Rosalita Chessman Martin Birth Date: [birth year] [age]:       [age] Martin ID: [id number] Height: 60.0 in. Weight: 212.8 lbs. Measured: 05/08/2018 10:37:42 AM (16 SP 2) Gender: Female Ethnicity: Black Analyzed: 05/08/2018 10:47:34 AM (16 SP 2) FRAX* 10-year Probability of Fracture Based on femoral neck BMD: DualFemur (Left) Major Osteoporotic Fracture: 5.3% Hip Fracture:                1.3% Population:                  [place] (Black) Risk Factors:                None  *FRAX is a Materials engineer of the [employer] for Metabolic Bone Disease, a [place]athalie (WHO) [facility]. ASSESSMENT: The probability of a major osteoporotic fracture is 5.3% within the next ten years. The probability of a hip fracture is 1.3% within the next ten years. Electronically Signed   By: Lowella Grip III M.D.   On: 05/08/2018 10:23     Assessment & Plan:  Plan  I am having Felicia Martin maintain her meloxicam, Pitavastatin Calcium, glucose blood, Lancets, OneTouch Verio, fluticasone, methocarbamol, albuterol, hydrochlorothiazide, amLODipine, ofloxacin, and metFORMIN.  Meds ordered this encounter  Medications   metFORMIN (GLUCOPHAGE XR) 500 MG 24 hr tablet    Sig: Take 1 tablet (500 mg total) by mouth daily with breakfast.    Dispense:  90 tablet    Refill:  1    Problem List Items Addressed This Visit      Unprioritized   DM (diabetes mellitus) type II uncontrolled, periph vascular disorder (HCC)   Relevant Medications   metFORMIN (GLUCOPHAGE XR) 500 MG 24 hr tablet   Essential hypertension    Well controlled, no changes to meds. Encouraged heart healthy diet such as the DASH diet and exercise as tolerated.       Impacted cerumen of both ears    F/u ent       Other Visit Diagnoses    Hyperlipidemia associated with type 2 diabetes mellitus ([place])       Relevant Medications   metFORMIN (GLUCOPHAGE XR) 500 MG 24 hr tablet      Follow-up: Return in about 6 months (around 05/02/2019), or if symptoms worsen or fail to improve.  Ann Held, DO

## 2018-11-02 ENCOUNTER — Other Ambulatory Visit: Payer: Self-pay | Admitting: Family Medicine

## 2018-11-02 DIAGNOSIS — E785 Hyperlipidemia, unspecified: Secondary | ICD-10-CM

## 2018-11-03 ENCOUNTER — Other Ambulatory Visit: Payer: Self-pay

## 2018-11-03 MED ORDER — PITAVASTATIN CALCIUM 4 MG PO TABS: 1.0000 | ORAL_TABLET | Freq: Every day | ORAL | 1 refills | Status: DC

## 2018-12-05 ENCOUNTER — Other Ambulatory Visit: Payer: Self-pay | Admitting: *Deleted

## 2018-12-05 MED ORDER — ONETOUCH DELICA PLUS LANCET33G MISC: 1.0000 | Freq: Every day | 1 refills | Status: DC

## 2018-12-11 ENCOUNTER — Other Ambulatory Visit: Payer: Self-pay | Admitting: *Deleted

## 2018-12-11 MED ORDER — ONETOUCH VERIO VI STRP: ORAL_STRIP | 1 refills | Status: DC

## 2019-01-02 ENCOUNTER — Other Ambulatory Visit: Payer: Self-pay | Admitting: Family Medicine

## 2019-02-28 ENCOUNTER — Other Ambulatory Visit: Payer: Self-pay | Admitting: Family Medicine

## 2019-02-28 DIAGNOSIS — I10 Essential (primary) hypertension: Secondary | ICD-10-CM

## 2019-04-29 NOTE — Progress Notes (Signed)
Virtual Visit via Audio Note  I connected with patient on 04/30/19 at 10:15 AM EST by audio enabled telemedicine application and verified that I am speaking with the correct person using two identifiers.   THIS ENCOUNTER IS A VIRTUAL VISIT DUE TO COVID-19 - PATIENT WAS NOT SEEN IN THE OFFICE. PATIENT HAS CONSENTED TO VIRTUAL VISIT / TELEMEDICINE VISIT   Location of patient: home  Location of provider: office  I discussed the limitations of evaluation and management by telemedicine and the availability of in person appointments. The patient expressed understanding and agreed to proceed.   Subjective:   Felicia Martin is a 86 y.o. female who presents for Medicare Annual (Subsequent) preventive examination.  Pt has 4 daughters that call her daily.  Enjoys attending church online 2x/wk currently due to Covid.   Review of Systems:   Home Safety/Smoke Alarms: Feels safe in home. Smoke alarms in place.  Lives alone. No stairs. Wears life alert necklace. One dtr lives 4 houses down.  Female:         Mammo- 11/07/17. Declines today.      Dexa scan- 05/08/18           Objective:     Vitals: BP (!) 143/74 Comment: pt reported  LMP 03/17/2014   There is no height or weight on file to calculate BMI.  Advanced Directives 04/30/2019 04/28/2018 08/09/2017 03/26/2017 07/18/2016 02/23/2016 10/20/2015  Does Patient Have a Medical Advance Directive? Yes Yes No Yes Yes Yes No  Type of Paramedic of [place];Living will [place];Living will - [place];Living will - [place];Living will -  Does patient want to make changes to medical advance directive? No - Patient declined No - Patient declined - No - Patient declined No - Patient declined - -  Copy of [place] in Chart? No - copy requested No - copy requested - No - copy requested - No - copy requested -  Would patient like information on creating a  medical advance directive? - - No - Patient declined - - - No - patient declined information    Tobacco Social History   Tobacco Use  Smoking Status Former Smoker  . Packs/day: 0.50  . Years: 40.00  . Pack years: 20.00  . Types: Cigarettes  . Quit date: 05/25/1999  . Years since quitting: 19.9  Smokeless Tobacco Never Used     Counseling given: Not Answered   Clinical Intake: Pain : No/denies pain     Past Medical History:  Diagnosis Date  . Asthma   . GERD (gastroesophageal reflux disease)   . Hypercholesterolemia   . Hypertension   . Pulmonary fibrosis ([place])   . Sarcoidosis   . Surgical history of tubal ligation 1960s   Past Surgical History:  Procedure Laterality Date  . COLONOSCOPY  12/09/14  . ESOPHAGOGASTRODUODENOSCOPY  12/09/14  . LUNG SURGERY  1960   Right lung  . Maloney dilation  12/09/14  . TUBAL LIGATION  1960   Family History  Problem Relation Age of Onset  . Diabetes Sister        x's 3  . Alzheimer's disease Sister   . Liver cancer Mother   . Liver cancer Father   . Cancer Father   . Cancer Brother   . Diabetes Brother   . Hypertension Sister        x's 3  . Hyperlipidemia Other   . Breast cancer Neg  Hx    Social History   Socioeconomic History  . Marital status: Single    Spouse name: Not on file  . Number of children: 4  . Years of education: Not on file  . Highest education level: Not on file  Occupational History  . Occupation: Chartered certified accountant  Tobacco Use  . Smoking status: Former Smoker    Packs/day: 0.50    Years: 40.00    Pack years: 20.00    Types: Cigarettes    Quit date: 05/25/1999    Years since quitting: 19.9  . Smokeless tobacco: Never Used  Substance and Sexual Activity  . Alcohol use: No  . Drug use: No  . Sexual activity: Never  Other Topics Concern  . Not on file  Social History Narrative  . Not on file   Social Determinants of Health   Financial Resource Strain: Low Risk   . Difficulty of Paying Living  Expenses: Not hard at all  Food Insecurity:   . Worried About Charity fundraiser in the Last Year: Not on file  . Ran Out of Food in the Last Year: Not on file  Transportation Needs: No Transportation Needs  . Lack of Transportation (Medical): No  . Lack of Transportation (Non-Medical): No  Physical Activity:   . Days of Exercise per Week: Not on file  . Minutes of Exercise per Session: Not on file  Stress:   . Feeling of Stress : Not on file  Social Connections:   . Frequency of Communication with Friends and Family: Not on file  . Frequency of Social Gatherings with Friends and Family: Not on file  . Attends Religious Services: Not on file  . Active Member of Clubs or Organizations: Not on file  . Attends Archivist Meetings: Not on file  . Marital Status: Not on file    Outpatient Encounter Medications as of 04/30/2019  Medication Sig  . amLODipine (NORVASC) 5 MG tablet Take 1 tablet (5 mg total) by mouth daily.  . Blood Glucose Monitoring Suppl (ONETOUCH VERIO) w/Device KIT Use as directed once a day.  Dx code: E11.9  . glucose blood (ONETOUCH VERIO) test strip Use as instructed once a day.  Dx Code: E11.9  . hydrochlorothiazide (HYDRODIURIL) 25 MG tablet Take 1 tablet (25 mg total) by mouth daily.  . Lancets (ONETOUCH DELICA PLUS [id number]) MISC 1 each by Does not apply route daily. DX CODE: E11.9  . meloxicam (MOBIC) 15 MG tablet TAKE 1 TABLET BY MOUTH EVERY DAY AS NEEDED FOR PAIN  . metFORMIN (GLUCOPHAGE XR) 500 MG 24 hr tablet Take 1 tablet (500 mg total) by mouth daily with breakfast.  . methocarbamol (ROBAXIN) 500 MG tablet Take 1 tablet (500 mg total) by mouth every 8 (eight) hours as needed for muscle spasms.  Marland Kitchen ofloxacin (FLOXIN OTIC) 0.3 % OTIC solution Place 10 drops into both ears daily.  Marland Kitchen albuterol (VENTOLIN HFA) 108 (90 Base) MCG/ACT inhaler Inhale 2 puffs into the lungs every 6 (six) hours as needed. (Patient not taking: Reported on 04/30/2019)  .  fluticasone (FLOVENT HFA) 110 MCG/ACT inhaler Inhale 2 puffs into the lungs 2 (two) times daily. (Patient not taking: Reported on 04/30/2019)  . Pitavastatin Calcium 4 MG TABS Take 1 tablet (4 mg total) by mouth at bedtime. (Patient not taking: Reported on 04/30/2019)   No facility-administered encounter medications on file as of 04/30/2019.    Activities of Daily Living In your present state of health, do  you have any difficulty performing the following activities: 04/30/2019  Hearing? Y  Comment has hearing aids but doesn't wear them daily.  Vision? N  Difficulty concentrating or making decisions? N  Walking or climbing stairs? N  Dressing or bathing? N  Doing errands, shopping? N  Preparing Food and eating ? N  Using the Toilet? N  In the past six months, have you accidently leaked urine? N  Do you have problems with loss of bowel control? N  Managing your Medications? N  Managing your Finances? N  Housekeeping or managing your Housekeeping? N  Some recent data might be hidden    Patient Care Team: Carollee Herter, Alferd Apa, DO as PCP - General (Family Medicine) Inda Castle, MD (Inactive) as Consulting Physician (Gastroenterology)    Assessment:   This is a routine wellness examination for Jacey. Physical assessment deferred to PCP.   Exercise Activities and Dietary recommendations Current Exercise Habits: The patient does not participate in regular exercise at present, Exercise limited by: None identified Diet (meal preparation, eat out, water intake, caffeinated beverages, dairy products, fruits and vegetables): 24 hr recall Breakfast: bacon, egg, and grits Lunch: [place] sandwich Dinner: steak and salad.  Goals    . Continue to improve diet.   (pt-stated)       Fall Risk Fall Risk  04/30/2019 04/28/2018 03/26/2017 02/23/2016 11/17/2015  Falls in the past year? 0 0 No No No  Number falls in past yr: 0 - - - -  Injury with Fall? 0 - - - -  Risk for fall due to : - - -  - -  Risk for fall due to: Comment - - - - -  Follow up Education provided;Falls prevention discussed - - - -   Depression Screen PHQ 2/9 Scores 04/30/2019 04/28/2018 03/26/2017 07/18/2016  PHQ - 2 Score 0 0 0 0     Cognitive Function Ad8 score reviewed for issues:  Issues making decisions:no  Less interest in hobbies / activities:no  Repeats questions, stories (family complaining):no  Trouble using ordinary gadgets (microwave, computer, phone):no  Forgets the month or year: no  Mismanaging finances: no  Remembering appts:no  Daily problems with thinking and/or memory:no Ad8 score is=0  MMSE - Mini Mental State Exam 03/26/2017 02/23/2016 02/23/2015  Orientation to time '5 5 5  ' Orientation to Place '5 5 5  ' Registration '3 3 3  ' Attention/ Calculation '5 5 5  ' Recall '3 2 3  ' Language- name 2 objects '2 2 2  ' Language- repeat '1 1 1  ' Language- follow 3 step command '3 3 3  ' Language- read & follow direction '1 1 1  ' Write a sentence '1 1 1  ' Copy design '1 1 1  ' Total score '30 29 30        ' Immunization History  Administered Date(s) Administered  . Td 03/19/2002   Screening Tests Health Maintenance  Topic Date Due  . FOOT EXAM  01/22/1943  . OPHTHALMOLOGY EXAM  [birth year]22/1943  . MAMMOGRAM  11/08/2018  . TETANUS/TDAP  03/18/2022 (Originally 03/19/2012)  . PNA vac Low Risk Adult (1 of 2 - PCV13) 03/19/2026 (Originally 01/23/2000)  . INFLUENZA VACCINE  06/17/2026 (Originally 10/18/2018)  . HEMOGLOBIN A1C  05/02/2019  . URINE MICROALBUMIN  10/30/2019  . DEXA SCAN  Completed      Plan:    Please schedule your next medicare wellness visit with me in 1 yr.  Continue to eat heart healthy diet (full of fruits, vegetables, whole grains, lean  protein, water--limit salt, fat, and sugar intake) and increase physical activity as tolerated.  Continue doing brain stimulating activities (puzzles, reading, adult coloring books, staying active) to keep memory sharp.   Bring a copy of your living will  and/or healthcare power of attorney to your next office visit.    I have personally reviewed and noted the following in the patient's chart:   . Medical and social history . Use of alcohol, tobacco or illicit drugs  . Current medications and supplements . Functional ability and status . Nutritional status . Physical activity . Advanced directives . List of other physicians . Hospitalizations, surgeries, and ER visits in previous 12 months . Vitals . Screenings to include cognitive, depression, and falls . Referrals and appointments  In addition, I have reviewed and discussed with patient certain preventive protocols, quality metrics, and best practice recommendations. A written personalized care plan for preventive services as well as general preventive health recommendations were provided to patient.     Shela Nevin, [place]  04/30/2019

## 2019-04-30 ENCOUNTER — Encounter: Payer: Self-pay | Admitting: *Deleted

## 2019-04-30 ENCOUNTER — Ambulatory Visit: Payer: Medicare Other | Admitting: *Deleted

## 2019-04-30 ENCOUNTER — Ambulatory Visit (INDEPENDENT_AMBULATORY_CARE_PROVIDER_SITE_OTHER): Payer: Medicare Other | Admitting: *Deleted

## 2019-04-30 ENCOUNTER — Other Ambulatory Visit: Payer: Self-pay

## 2019-04-30 VITALS — BP 143/74

## 2019-04-30 DIAGNOSIS — Z0000 Encounter for general adult medical examination without abnormal findings: Secondary | ICD-10-CM | POA: Diagnosis not present

## 2019-04-30 NOTE — Patient Instructions (Signed)
Please schedule your next medicare wellness visit with me in 1 yr.  Continue to eat heart healthy diet (full of fruits, vegetables, whole grains, lean protein, water--limit salt, fat, and sugar intake) and increase physical activity as tolerated.  Continue doing brain stimulating activities (puzzles, reading, adult coloring books, staying active) to keep memory sharp.   Bring a copy of your living will and/or healthcare power of attorney to your next office visit.   Felicia Martin , Thank you for taking time to come for your Medicare Wellness Visit. I appreciate your ongoing commitment to your health goals. Please review the following plan we discussed and let me know if I can assist you in the future.   These are the goals we discussed: Goals    . Continue to improve diet.   (pt-stated)       This is a list of the screening recommended for you and due dates:  Health Maintenance  Topic Date Due  . Complete foot exam   01/22/1943  . Eye exam for diabetics  01/22/1943  . Mammogram  11/08/2018  . Tetanus Vaccine  03/18/2022*  . Pneumonia vaccines (1 of 2 - PCV13) 03/19/2026*  . Flu Shot  06/17/2026*  . Hemoglobin A1C  05/02/2019  . Urine Protein Check  10/30/2019  . DEXA scan (bone density measurement)  Completed  *Topic was postponed. The date shown is not the original due date.    Preventive Care 33 Years and Older, Female Preventive care refers to lifestyle choices and visits with your health care provider that can promote health and wellness. This includes:  A yearly physical exam. This is also called an annual well check.  Regular dental and eye exams.  Immunizations.  Screening for certain conditions.  Healthy lifestyle choices, such as diet and exercise. What can I expect for my preventive care visit? Physical exam Your health care provider will check:  Height and weight. These may be used to calculate body mass index (BMI), which is a measurement that tells if you  are at a healthy weight.  Heart rate and blood pressure.  Your skin for abnormal spots. Counseling Your health care provider may ask you questions about:  Alcohol, tobacco, and drug use.  Emotional well-being.  Home and relationship well-being.  Sexual activity.  Eating habits.  History of falls.  Memory and ability to understand (cognition).  Work and work Statistician.  Pregnancy and menstrual history. What immunizations do I need?  Influenza (flu) vaccine  This is recommended every year. Tetanus, diphtheria, and pertussis (Tdap) vaccine  You may need a Td booster every 10 years. Varicella (chickenpox) vaccine  You may need this vaccine if you have not already been vaccinated. Zoster (shingles) vaccine  You may need this after age 84. Pneumococcal conjugate (PCV13) vaccine  One dose is recommended after age 31. Pneumococcal polysaccharide (PPSV23) vaccine  One dose is recommended after age 4. Measles, mumps, and rubella (MMR) vaccine  You may need at least one dose of MMR if you were born in 1957 or later. You may also need a second dose. Meningococcal conjugate (MenACWY) vaccine  You may need this if you have certain conditions. Hepatitis A vaccine  You may need this if you have certain conditions or if you travel or work in places where you may be exposed to hepatitis A. Hepatitis B vaccine  You may need this if you have certain conditions or if you travel or work in places where you may be exposed  to hepatitis B. Haemophilus influenzae type b (Hib) vaccine  You may need this if you have certain conditions. You may receive vaccines as individual doses or as more than one vaccine together in one shot (combination vaccines). Talk with your health care provider about the risks and benefits of combination vaccines. What tests do I need? Blood tests  Lipid and cholesterol levels. These may be checked every 5 years, or more frequently depending on your  overall health.  Hepatitis C test.  Hepatitis B test. Screening  Lung cancer screening. You may have this screening every year starting at age 83 if you have a 30-pack-year history of smoking and currently smoke or have quit within the past 15 years.  Colorectal cancer screening. All adults should have this screening starting at age 50 and continuing until age 77. Your health care provider may recommend screening at age 78 if you are at increased risk. You will have tests every 1-10 years, depending on your results and the type of screening test.  Diabetes screening. This is done by checking your blood sugar (glucose) after you have not eaten for a while (fasting). You may have this done every 1-3 years.  Mammogram. This may be done every 1-2 years. Talk with your health care provider about how often you should have regular mammograms.  BRCA-related cancer screening. This may be done if you have a family history of breast, ovarian, tubal, or peritoneal cancers. Other tests  Sexually transmitted disease (STD) testing.  Bone density scan. This is done to screen for osteoporosis. You may have this done starting at age 65. Follow these instructions at home: Eating and drinking  Eat a diet that includes fresh fruits and vegetables, whole grains, lean protein, and low-fat dairy products. Limit your intake of foods with high amounts of sugar, saturated fats, and salt.  Take vitamin and mineral supplements as recommended by your health care provider.  Do not drink alcohol if your health care provider tells you not to drink.  If you drink alcohol: ? Limit how much you have to 0-1 drink a day. ? Be aware of how much alcohol is in your drink. In the [place] one drink equals one 12 oz bottle of beer (355 mL), one 5 oz glass of wine (148 mL), or one 1 oz glass of hard liquor (44 mL). Lifestyle  Take daily care of your teeth and gums.  Stay active. Exercise for at least 30 minutes on 5 or more  days each week.  Do not use any products that contain nicotine or tobacco, such as cigarettes, e-cigarettes, and chewing tobacco. If you need help quitting, ask your health care provider.  If you are sexually active, practice safe sex. Use a condom or other form of protection in order to prevent STIs (sexually transmitted infections).  Talk with your health care provider about taking a low-dose aspirin or statin. What's next?  Go to your health care provider once a year for a well check visit.  Ask your health care provider how often you should have your eyes and teeth checked.  Stay up to date on all vaccines. This information is not intended to replace advice given to you by your health care provider. Make sure you discuss any questions you have with your health care provider. Document Revised: 02/27/2018 Document Reviewed: 02/27/2018 Elsevier Patient Education  2020 [employer].

## 2019-05-13 ENCOUNTER — Other Ambulatory Visit: Payer: Self-pay | Admitting: *Deleted

## 2019-05-13 DIAGNOSIS — E1151 Type 2 diabetes mellitus with diabetic peripheral angiopathy without gangrene: Secondary | ICD-10-CM

## 2019-05-13 DIAGNOSIS — IMO0002 Reserved for concepts with insufficient information to code with codable children: Secondary | ICD-10-CM

## 2019-05-13 MED ORDER — METFORMIN HCL ER 500 MG PO TB24: 500.0000 mg | ORAL_TABLET | Freq: Every day | ORAL | 0 refills | Status: DC

## 2019-07-08 ENCOUNTER — Other Ambulatory Visit: Payer: Self-pay

## 2019-07-09 ENCOUNTER — Encounter: Payer: Self-pay | Admitting: Family Medicine

## 2019-07-09 ENCOUNTER — Ambulatory Visit (INDEPENDENT_AMBULATORY_CARE_PROVIDER_SITE_OTHER): Payer: Medicare Other | Admitting: Family Medicine

## 2019-07-09 VITALS — BP 120/80 | HR 88 | Temp 97.8°F | Resp 18 | Ht 60.0 in | Wt 210.4 lb

## 2019-07-09 DIAGNOSIS — E1169 Type 2 diabetes mellitus with other specified complication: Secondary | ICD-10-CM | POA: Diagnosis not present

## 2019-07-09 DIAGNOSIS — E785 Hyperlipidemia, unspecified: Secondary | ICD-10-CM | POA: Diagnosis not present

## 2019-07-09 DIAGNOSIS — I10 Essential (primary) hypertension: Secondary | ICD-10-CM | POA: Diagnosis not present

## 2019-07-09 DIAGNOSIS — E1165 Type 2 diabetes mellitus with hyperglycemia: Secondary | ICD-10-CM

## 2019-07-09 LAB — MICROALBUMIN / CREATININE URINE RATIO
Creatinine,U: 153.3 mg/dL
Microalb Creat Ratio: 1.2 mg/g (ref 0.0–30.0)
Microalb, Ur: 1.9 mg/dL (ref 0.0–1.9)

## 2019-07-09 LAB — COMPREHENSIVE METABOLIC PANEL
ALT: 9 U/L (ref 0–35)
AST: 16 U/L (ref 0–37)
Albumin: 4.2 g/dL (ref 3.5–5.2)
Alkaline Phosphatase: 58 U/L (ref 39–117)
BUN: 23 mg/dL (ref 6–23)
CO2: 26 mEq/L (ref 19–32)
Calcium: 9.9 mg/dL (ref 8.4–10.5)
Chloride: 102 mEq/L (ref 96–112)
Creatinine, Ser: 0.95 mg/dL (ref 0.40–1.20)
GFR: 67.74 mL/min (ref >60.00)
Glucose, Bld: 121 mg/dL — ABNORMAL HIGH (ref 70–99)
Potassium: 3.9 mEq/L (ref 3.5–5.1)
Sodium: 138 mEq/L (ref 135–145)
Total Bilirubin: 0.5 mg/dL (ref 0.2–1.2)
Total Protein: 7.9 g/dL (ref 6.0–8.3)

## 2019-07-09 LAB — LIPID PANEL
Cholesterol: 328 mg/dL — ABNORMAL HIGH (ref 0–200)
HDL: 62.3 mg/dL (ref >39.00)
LDL Cholesterol: 241 mg/dL — ABNORMAL HIGH (ref 0–99)
NonHDL: 265.56
Total CHOL/HDL Ratio: 5
Triglycerides: 123 mg/dL (ref 0.0–149.0)
VLDL: 24.6 mg/dL (ref 0.0–40.0)

## 2019-07-09 LAB — HEMOGLOBIN A1C: Hgb A1c MFr Bld: 6.8 % — ABNORMAL HIGH (ref 4.6–6.5)

## 2019-07-09 NOTE — Patient Instructions (Signed)
Carbohydrate Counting for Diabetes Mellitus, Adult  Carbohydrate counting is a method of keeping track of how many carbohydrates you eat. Eating carbohydrates naturally increases the amount of sugar (glucose) in the blood. Counting how many carbohydrates you eat helps keep your blood glucose within normal limits, which helps you manage your diabetes (diabetes mellitus). It is important to know how many carbohydrates you can safely have in each meal. This is different for every person. A diet and nutrition specialist (registered dietitian) can help you make a meal plan and calculate how many carbohydrates you should have at each meal and snack. Carbohydrates are found in the following foods:  Grains, such as breads and cereals.  Dried beans and soy products.  Starchy vegetables, such as potatoes, peas, and corn.  Fruit and fruit juices.  Milk and yogurt.  Sweets and snack foods, such as cake, cookies, candy, chips, and soft drinks. How do I count carbohydrates? There are two ways to count carbohydrates in food. You can use either of the methods or a combination of both. Reading "Nutrition Facts" on packaged food The "Nutrition Facts" list is included on the labels of almost all packaged foods and beverages in the [place] It includes:  The serving size.  Information about nutrients in each serving, including the grams (g) of carbohydrate per serving. To use the "Nutrition Facts":  Decide how many servings you will have.  Multiply the number of servings by the number of carbohydrates per serving.  The resulting number is the total amount of carbohydrates that you will be having. Learning standard serving sizes of other foods When you eat carbohydrate foods that are not packaged or do not include "Nutrition Facts" on the label, you need to measure the servings in order to count the amount of carbohydrates:  Measure the foods that you will eat with a food scale or measuring cup, if  needed.  Decide how many standard-size servings you will eat.  Multiply the number of servings by 15. Most carbohydrate-rich foods have about 15 g of carbohydrates per serving. ? For example, if you eat 8 oz (170 g) of strawberries, you will have eaten 2 servings and 30 g of carbohydrates (2 servings x 15 g = 30 g).  For foods that have more than one food mixed, such as soups and casseroles, you must count the carbohydrates in each food that is included. The following list contains standard serving sizes of common carbohydrate-rich foods. Each of these servings has about 15 g of carbohydrates:   hamburger bun or  English muffin.   oz (15 mL) syrup.   oz (14 g) jelly.  1 slice of bread.  1 six-inch tortilla.  3 oz (85 g) cooked rice or pasta.  4 oz (113 g) cooked dried beans.  4 oz (113 g) starchy vegetable, such as peas, corn, or potatoes.  4 oz (113 g) hot cereal.  4 oz (113 g) mashed potatoes or  of a large baked potato.  4 oz (113 g) canned or frozen fruit.  4 oz (120 mL) fruit juice.  4-6 crackers.  6 chicken nuggets.  6 oz (170 g) unsweetened dry cereal.  6 oz (170 g) plain fat-free yogurt or yogurt sweetened with artificial sweeteners.  8 oz (240 mL) milk.  8 oz (170 g) fresh fruit or one small piece of fruit.  24 oz (680 g) popped popcorn. Example of carbohydrate counting Sample meal  3 oz (85 g) chicken breast.  6 oz (170 g)   brown rice.  4 oz (113 g) corn.  8 oz (240 mL) milk.  8 oz (170 g) strawberries with sugar-free whipped topping. Carbohydrate calculation 1. Identify the foods that contain carbohydrates: ? Rice. ? Corn. ? Milk. ? Strawberries. 2. Calculate how many servings you have of each food: ? 2 servings rice. ? 1 serving corn. ? 1 serving milk. ? 1 serving strawberries. 3. Multiply each number of servings by 15 g: ? 2 servings rice x 15 g = 30 g. ? 1 serving corn x 15 g = 15 g. ? 1 serving milk x 15 g = 15 g. ? 1  serving strawberries x 15 g = 15 g. 4. Add together all of the amounts to find the total grams of carbohydrates eaten: ? 30 g + 15 g + 15 g + 15 g = 75 g of carbohydrates total. Summary  Carbohydrate counting is a method of keeping track of how many carbohydrates you eat.  Eating carbohydrates naturally increases the amount of sugar (glucose) in the blood.  Counting how many carbohydrates you eat helps keep your blood glucose within normal limits, which helps you manage your diabetes.  A diet and nutrition specialist (registered dietitian) can help you make a meal plan and calculate how many carbohydrates you should have at each meal and snack. This information is not intended to replace advice given to you by your health care provider. Make sure you discuss any questions you have with your health care provider. Document Revised: 09/27/2016 Document Reviewed: 08/17/2015 Elsevier Patient Education  2020 Elsevier Inc.  

## 2019-07-09 NOTE — Progress Notes (Signed)
Patient ID: BREANAH FADDIS, female    DOB: [birth year]  Age: 86 y.o. MRN: [id number]    Subjective:  Subjective  HPI HELGA ASBURY presents for f/u dm, chol and bp.    HYPERTENSION   Blood pressure range-no checking   Chest pain- no      Dyspnea- no Lightheadedness- no   Edema- no  Other side effects - no   Medication compliance: good Low salt diet- yes    DIABETES    Blood Sugar ranges-always good 112-142  Polyuria- no New Visual problems- no  Hypoglycemic symptoms- no  Other side effects-no Medication compliance - good Last eye exam- due  Foot exam- today   HYPERLIPIDEMIA  Medication compliance- good RUQ pain- no  Muscle aches- no Other side effects-no       Review of Systems  Constitutional: Negative for appetite change, diaphoresis, fatigue and unexpected weight change.  Eyes: Negative for pain, redness and visual disturbance.  Respiratory: Negative for cough, chest tightness, shortness of breath and wheezing.   Cardiovascular: Negative for chest pain, palpitations and leg swelling.  Endocrine: Negative for cold intolerance, heat intolerance, polydipsia, polyphagia and polyuria.  Genitourinary: Negative for difficulty urinating, dysuria and frequency.  Neurological: Negative for dizziness, light-headedness, numbness and headaches.    History Past Medical History:  Diagnosis Date  . Asthma   . GERD (gastroesophageal reflux disease)   . Hypercholesterolemia   . Hypertension   . Pulmonary fibrosis ([place])   . Sarcoidosis   . Surgical history of tubal ligation 1960s    She has a past surgical history that includes Lung surgery (1960); Tubal ligation (1960); Maloney dilation (12/09/14); Colonoscopy (12/09/14); and Esophagogastroduodenoscopy (12/09/14).   Her family history includes Alzheimer's disease in her sister; Cancer in her brother and father; Diabetes in her brother and sister; Hyperlipidemia in an other family member; Hypertension in her sister;  Liver cancer in her father and mother.She reports that she quit smoking about 20 years ago. Her smoking use included cigarettes. She has a 20.00 pack-year smoking history. She has never used smokeless tobacco. She reports that she does not drink alcohol or use drugs.  Current Outpatient Medications on File Prior to Visit  Medication Sig Dispense Refill  . amLODipine (NORVASC) 5 MG tablet Take 1 tablet (5 mg total) by mouth daily. 90 tablet 0  . Blood Glucose Monitoring Suppl (ONETOUCH VERIO) w/Device KIT Use as directed once a day.  Dx code: E11.9 1 kit 0  . glucose blood (ONETOUCH VERIO) test strip Use as instructed once a day.  Dx Code: E11.9 100 each 1  . hydrochlorothiazide (HYDRODIURIL) 25 MG tablet Take 1 tablet (25 mg total) by mouth daily. 90 tablet 3  . Lancets (ONETOUCH DELICA PLUS [id number]) MISC 1 each by Does not apply route daily. DX CODE: E11.9 100 each 1  . meloxicam (MOBIC) 15 MG tablet TAKE 1 TABLET BY MOUTH EVERY DAY AS NEEDED FOR PAIN 30 tablet 2  . metFORMIN (GLUCOPHAGE XR) 500 MG 24 hr tablet Take 1 tablet (500 mg total) by mouth daily with breakfast. NEEDS OV/FOLLOW UP 90 tablet 0  . methocarbamol (ROBAXIN) 500 MG tablet Take 1 tablet (500 mg total) by mouth every 8 (eight) hours as needed for muscle spasms. 30 tablet 0  . ofloxacin (FLOXIN OTIC) 0.3 % OTIC solution Place 10 drops into both ears daily. 10 mL 1  . albuterol (VENTOLIN HFA) 108 (90 Base) MCG/ACT inhaler Inhale 2 puffs into the lungs every 6 (six) hours  as needed. (Patient not taking: Reported on 04/30/2019) 18 g 2  . fluticasone (FLOVENT HFA) 110 MCG/ACT inhaler Inhale 2 puffs into the lungs 2 (two) times daily. (Patient not taking: Reported on 04/30/2019) 1 Inhaler 3  . Pitavastatin Calcium 4 MG TABS Take 1 tablet (4 mg total) by mouth at bedtime. (Patient not taking: Reported on 04/30/2019) 90 tablet 1   No current facility-administered medications on file prior to visit.     Objective:  Objective  Physical  Exam Vitals and nursing note reviewed.  Constitutional:      Appearance: She is well-developed.  HENT:     Head: Normocephalic and atraumatic.  Eyes:     Conjunctiva/sclera: Conjunctivae normal.  Neck:     Thyroid: No thyromegaly.     Vascular: No carotid bruit or JVD.  Cardiovascular:     Rate and Rhythm: Normal rate and regular rhythm.     Heart sounds: Normal heart sounds. No murmur.  Pulmonary:     Effort: Pulmonary effort is normal. No respiratory distress.     Breath sounds: Normal breath sounds. No wheezing or rales.  Chest:     Chest wall: No tenderness.  Musculoskeletal:     Cervical back: Normal range of motion and neck supple.  Neurological:     Mental Status: She is alert and oriented to person, place, and time.    Diabetic Foot Exam - Simple   Simple Foot Form Diabetic Foot exam was performed with the following findings: Yes 07/09/2019 12:15 PM  Visual Inspection No deformities, no ulcerations, no other skin breakdown bilaterally: Yes Sensation Testing Intact to touch and monofilament testing bilaterally: Yes Pulse Check Posterior Tibialis and Dorsalis pulse intact bilaterally: Yes Comments     BP 120/80   Pulse 88   Temp 97.8 F (36.6 C) (Temporal)   Resp 18   Ht 5' (1.524 m)   Wt 210 lb 6.4 oz (95.4 kg)   LMP 03/17/2014   SpO2 94%   BMI 41.09 kg/m  Wt Readings from Last 3 Encounters:  07/09/19 210 lb 6.4 oz (95.4 kg)  10/30/18 205 lb 3.2 oz (93.1 kg)  10/16/18 206 lb 12.8 oz (93.8 kg)     Lab Results  Component Value Date   WBC 4.4 12/16/2013   HGB 12.2 12/16/2013   HCT 36.7 12/16/2013   PLT 279.0 12/16/2013   GLUCOSE 107 (H) 10/30/2018   CHOL 302 (H) 10/30/2018   TRIG 115.0 10/30/2018   HDL 58.90 10/30/2018   LDLDIRECT 230.7 03/18/2013   LDLCALC 220 (H) 10/30/2018   ALT 10 10/30/2018   AST 16 10/30/2018   NA 137 10/30/2018   K 4.0 10/30/2018   CL 102 10/30/2018   CREATININE 0.85 10/30/2018   BUN 24 (H) 10/30/2018   CO2 25  10/30/2018   TSH 2.19 09/29/2009   HGBA1C 6.3 10/30/2018   MICROALBUR 1.0 10/30/2018    DG Bone Density  Result Date: 05/08/2018 EXAM: DUAL X-RAY ABSORPTIOMETRY (DXA) FOR BONE MINERAL DENSITY IMPRESSION: Alferd Apa [place] CHASE Your patient Katalaya Beel completed a BMD test on 05/08/2018 using the [place] (analysis version: 16.SP2) manufactured by [employer]. The following summarizes the results of our evaluation. PATIENT: Name: Chetara, Kropp Patient ID: [id number] Birth Date: 06-19-1932 Height: 60.0 in. Gender: Female Measured: 05/08/2018 Weight: 212.8 lbs. Indications: Advanced Age, African-American, Estrogen Deficiency, Post Menopausal, Previous Tobacco User Fractures: Treatments: ASSESSMENT: The BMD measured at Femur Neck Left is 0.832 g/cm2 with a T-score of -  1.5. This patient is considered osteopenic according to [facility]) criteria. Scan quality was good. Site Region Measured Date Measured Age WHO YA BMD Classification T-score AP Spine L1-L4 05/08/2018 [age] Osteopenia -1.3 1.021 g/cm2 DualFemur Neck Left 05/08/2018 [age] years Osteopenia -1.5 0.832 g/cm2 Left Forearm Radius 33% 05/08/2018 83.2 Normal -1.0 0.788 g/cm2 World Health Organization [facility]) criteria for post-menopausal, Caucasian Women: Normal       T-score at or above -1 SD Osteopenia   T-score between -1 and -2.5 SD Osteoporosis T-score at or below -2.5 SD RECOMMENDATION: 1. All patients should optimize calcium and vitamin D intake. 2. Consider FDA-approved medical therapies in postmenopausal women and men aged 49 years and older, based on the following: a. A hip or vertebral(clinical or morphometric) fracture. b. T-Score < -2.5 at the femoral neck or spine after appropriate evaluation to exclude secondary causes c. Low bone mass (T-score between -1.0 and -2.5 at the femoral neck or spine) and a 10 year probability of a hip fracture >3% or a 10 year probability of major osteoporosis-related fracture  > 20% based on the [place]-adapted WHO algorithm d. Clinical judgement and/or patient preferences may indicate treatment for people with 10-year fracture probabilities above or below these levels FOLLOW-UP: Patients with diagnosis of osteoporosis or at high risk for fracture should have regular bone mineral density tests. For patients eligible for Medicare, routine testing is allowed once every 2 years. The testing frequency can be increased to one year for patients who have rapidly progressing disease, those who are receiving or discontinuing medical therapy to restore bone mass, or have additional risk factors. I have reviewed this report and agree with the above findings. [facility] Patient: [name] Referring Physician: Rosalita Chessman CHASE Birth Date: 26-Apr-1932 [age] Patient ID: [id number] Height: 60.0 in. Weight: 212.8 lbs. Measured: 05/08/2018 10:37:42 AM (16 SP 2) Gender: Female Ethnicity: Black Analyzed: 05/08/2018 10:47:34 AM (16 SP 2) FRAX* 10-year Probability of Fracture Based on femoral neck BMD: DualFemur (Left) Major Osteoporotic Fracture: 5.3% Hip Fracture:                1.3% Population:                  [place] (Black) Risk Factors:                None *FRAX is a Materials engineer of the [employer] for Metabolic Bone Disease, a [place] (WHO) [facility]. ASSESSMENT: The probability of a major osteoporotic fracture is 5.3% within the next ten years. The probability of a hip fracture is 1.3% within the next ten years. Electronically Signed   By: Lowella Grip III M.D.   On: 05/08/2018 10:23     Assessment & Plan:  Plan  I am having Hulda T. Rumsey maintain her meloxicam, OneTouch Verio, fluticasone, methocarbamol, albuterol, hydrochlorothiazide, ofloxacin, Pitavastatin Calcium, OneTouch Delica Plus [id number], OneTouch Verio, amLODipine, and metFORMIN.  No orders of the defined types were placed in this  encounter.   Problem List Items Addressed This Visit      Unprioritized   Essential hypertension   Relevant Orders   Lipid panel   Hemoglobin A1c   Comprehensive metabolic panel    Other Visit Diagnoses    Hyperlipidemia associated with type 2 diabetes mellitus ([place])    -  Primary   Relevant Orders   Lipid panel   Comprehensive metabolic panel  Microalbumin / creatinine urine ratio   Uncontrolled type 2 diabetes mellitus with hyperglycemia ([place])       Relevant Orders   Hemoglobin A1c   Microalbumin / creatinine urine ratio      Follow-up: Return in about 6 months (around 01/08/2020) for annual exam, fasting.  Ann Held, DO

## 2019-07-15 MED ORDER — GABAPENTIN 100 MG PO CAPS: ORAL_CAPSULE | ORAL | 1 refills | Status: DC

## 2019-07-15 NOTE — Addendum Note (Signed)
Addended by: Wynonia Musty A on: 07/15/2019 03:40 PM   Modules accepted: Orders

## 2019-07-22 ENCOUNTER — Telehealth: Payer: Self-pay | Admitting: Family Medicine

## 2019-07-22 NOTE — Progress Notes (Signed)
  Chronic Care Management   Note  07/22/2019 Name: DANALYNN BERHOW MRN: [id number] DOB: 07-May-1932  [name] is a 86 y.o. year old female who is a primary care patient of Ann Held, DO. I reached out to [name] by phone today in response to a referral sent by Ms. Melonie T Bergland's PCP, Carollee Herter, Alferd Apa, DO.   Ms. Jiminez was given information about Chronic Care Management services today including:  1. CCM service includes personalized support from designated clinical staff supervised by her physician, including individualized plan of care and coordination with other care providers 2. 24/7 contact phone numbers for assistance for urgent and routine care needs. 3. Service will only be billed when office clinical staff spend 20 minutes or more in a month to coordinate care. 4. Only one practitioner may furnish and bill the service in a calendar month. 5. The patient may stop CCM services at any time (effective at the end of the month) by phone call to the office staff.   Patient agreed to services and verbal consent obtained.    This note is not being shared with the patient for the following reason: To respect privacy (The patient or proxy has requested that the information not be shared).  Follow up plan:   Raynicia Dukes [employer] Scheduler

## 2019-09-07 ENCOUNTER — Telehealth: Payer: Self-pay

## 2019-09-07 NOTE — Telephone Encounter (Signed)
Left msg on Felicia Martin's VM for her to call the office back to schedule an OV for her mother for a PT referral and that we will need an updated DPR, listing her on it, completed at the time of the OV so that going forward we can communicate with her regarding her mother.

## 2019-09-07 NOTE — Telephone Encounter (Signed)
Please advise. Patient's daugther not on DPR

## 2019-09-07 NOTE — Telephone Encounter (Signed)
She will need ov ---- daughter needs to be on DPR as well

## 2019-09-07 NOTE — Telephone Encounter (Signed)
Pt's Daughter, Olivia Mackie, called requesting an order for PT for the pt for strengthening for legs and back.  Requesting [facility] [employer].  I relayed to Olivia Mackie, pt may need an appt first before order can be placed.  Please call Olivia Mackie to advise [phone].

## 2019-09-09 ENCOUNTER — Other Ambulatory Visit: Payer: Self-pay

## 2019-09-09 DIAGNOSIS — E785 Hyperlipidemia, unspecified: Secondary | ICD-10-CM

## 2019-09-09 DIAGNOSIS — E1169 Type 2 diabetes mellitus with other specified complication: Secondary | ICD-10-CM

## 2019-09-09 DIAGNOSIS — E1165 Type 2 diabetes mellitus with hyperglycemia: Secondary | ICD-10-CM

## 2019-09-09 DIAGNOSIS — I10 Essential (primary) hypertension: Secondary | ICD-10-CM

## 2019-09-10 NOTE — Chronic Care Management (AMB) (Deleted)
Chronic Care Management Pharmacy  Name: Felicia Martin  MRN: [id number] DOB: [birth year]   Chief Complaint/ HPI  Felicia Martin,  86 y.o. , female presents for their Initial CCM visit with the clinical pharmacist via telephone due to COVID-19 Pandemic.  PCP : Ann Held, DO  Their chronic conditions include: Hypertension, Hyperlipidemia, Diabetes, Asthma and Osteoporosis   Office Visits: 07/09/19: Patient presented to Dr. Carollee Herter for follow-up. Patient started on gabapentin for peripheral neuropathy. A1c worsened to 6.8%, cholesterol worsened.  04/30/19: Patient presented to Naaman Plummer, RN for AWV.   Consult Visit: None in past 6 months  Allergies  Allergen Reactions  . Statins Other (See Comments)    Muscle aches and weakness  . Zetia [Ezetimibe] Other (See Comments)    Muscle pain  . Fenofibrate Rash    Medications: Outpatient Encounter Medications as of 09/11/2019  Medication Sig  . albuterol (VENTOLIN HFA) 108 (90 Base) MCG/ACT inhaler Inhale 2 puffs into the lungs every 6 (six) hours as needed. (Patient not taking: Reported on 04/30/2019)  . amLODipine (NORVASC) 5 MG tablet Take 1 tablet (5 mg total) by mouth daily.  . Blood Glucose Monitoring Suppl (ONETOUCH VERIO) w/Device KIT Use as directed once a day.  Dx code: E11.9  . fluticasone (FLOVENT HFA) 110 MCG/ACT inhaler Inhale 2 puffs into the lungs 2 (two) times daily. (Patient not taking: Reported on 04/30/2019)  . gabapentin (NEURONTIN) 100 MG capsule Take one tablet by mouth for 3-4 days, then increase to two tablets by mouth daily.  Marland Kitchen glucose blood (ONETOUCH VERIO) test strip Use as instructed once a day.  Dx Code: E11.9  . hydrochlorothiazide (HYDRODIURIL) 25 MG tablet Take 1 tablet (25 mg total) by mouth daily.  . Lancets (ONETOUCH DELICA PLUS [id number]) MISC 1 each by Does not apply route daily. DX CODE: E11.9  . meloxicam (MOBIC) 15 MG tablet TAKE 1 TABLET BY MOUTH EVERY DAY AS NEEDED FOR  PAIN  . metFORMIN (GLUCOPHAGE XR) 500 MG 24 hr tablet Take 1 tablet (500 mg total) by mouth daily with breakfast. NEEDS OV/FOLLOW UP  . methocarbamol (ROBAXIN) 500 MG tablet Take 1 tablet (500 mg total) by mouth every 8 (eight) hours as needed for muscle spasms.  Marland Kitchen ofloxacin (FLOXIN OTIC) 0.3 % OTIC solution Place 10 drops into both ears daily.  . Pitavastatin Calcium 4 MG TABS Take 1 tablet (4 mg total) by mouth at bedtime. (Patient not taking: Reported on 04/30/2019)   No facility-administered encounter medications on file as of 09/11/2019.     Current Diagnosis/Assessment:    Goals Addressed   None    Asthma    Last spirometry score: FEV1 84% (08/21/06)  Gold Grade: {CHL HP Upstream Pharm COPD Gold EPPIR:5188416606} Current COPD Classification:  {CHL HP Upstream Pharm COPD [phone]}  Eosinophil count:   Lab Results  Component Value Date/Time   EOSPCT 5.9 (H) 12/16/2013 08:10 AM  %                               Eos (Absolute):  Lab Results  Component Value Date/Time   EOSABS 0.3 12/16/2013 08:10 AM    Tobacco Status:  Social History   Tobacco Use  Smoking Status Former Smoker  . Packs/day: 0.50  . Years: 40.00  . Pack years: 20.00  . Types: Cigarettes  . Quit date: 05/25/1999  . Years since quitting: 20.3  Smokeless Tobacco Never  Used    Patient has failed these meds in past: *** Patient is currently {CHL Controlled/[phone]} on the following medications:   Albuterol HFA 108 mcg 2 puff q6hr PRN   Flovent 110 mcg/act 2 puff BID   Using maintenance inhaler regularly? {yes/no:20286} Frequency of rescue inhaler use:  {CHL HP Upstream Pharm Inhaler UGQB:1694503888}  We discussed:  {CHL HP Upstream Pharmacy discussion:5480315943}  Plan  Continue {CHL HP Upstream Pharmacy Plans:[phone]}  Diabetes   A1c goal {A1c goals:23924}  Recent Relevant Labs: Lab Results  Component Value Date/Time   HGBA1C 6.8 (H) 07/09/2019 10:07 AM     HGBA1C 6.3 10/30/2018 09:53 AM   GFR 67.74 07/09/2019 10:07 AM   GFR 77.14 10/30/2018 09:53 AM   MICROALBUR 1.9 07/09/2019 10:07 AM   MICROALBUR 1.0 10/30/2018 09:53 AM    Last diabetic Eye exam: No results found for: HMDIABEYEEXA  Last diabetic Foot exam: No results found for: HMDIABFOOTEX   Checking BG: {CHL HP Blood Glucose Monitoring Frequency:7254107048}  Recent FBG Readings: *** Recent pre-meal BG readings: *** Recent 2hr PP BG readings:  *** Recent HS BG readings: ***  Patient has failed these meds in past: *** Patient is currently {CHL Controlled/[phone]} on the following medications: Marland Kitchen Metformin XR 500 mg daily   We discussed: {CHL HP Upstream Pharmacy discussion:5480315943}  Plan  Continue {CHL HP Upstream Pharmacy Plans:[phone]}  Hypertension   BP goal is:  {CHL HP UPSTREAM Pharmacist BP [phone]}  Office blood pressures are  BP Readings from Last 3 Encounters:  07/09/19 120/80  04/30/19 (!) 143/74  10/30/18 138/82   CMP Latest Ref Rng & Units 07/09/2019 10/30/2018 06/02/2018  Glucose 70 - 99 mg/dL 121(H) 107(H) 146(H)  BUN 6 - 23 mg/dL 23 24(H) 23  Creatinine 0.40 - 1.20 mg/dL 0.95 0.85 0.99  Sodium 135 - 145 mEq/L 138 137 137  Potassium 3.5 - 5.1 mEq/L 3.9 4.0 4.1  Chloride 96 - 112 mEq/L 102 102 103  CO2 19 - 32 mEq/L '26 25 24  ' Calcium 8.4 - 10.5 mg/dL 9.9 9.7 9.8  Total Protein 6.0 - 8.3 g/dL 7.9 8.1 8.0  Total Bilirubin 0.2 - 1.2 mg/dL 0.5 0.5 0.3  Alkaline Phos 39 - 117 U/L 58 71 81  AST 0 - 37 U/L '16 16 13  ' ALT 0 - 35 U/L '9 10 8     ' Patient checks BP at home {CHL HP BP Monitoring [phone]} Patient home BP readings are ranging: ***  Patient has failed these meds in the past: *** Patient is currently {CHL Controlled/[phone]} on the following medications:  . Amlodipine 5 mg daily  . HCTZ 25 mg daily   We discussed {CHL HP Upstream Pharmacy discussion:5480315943}  Plan  Continue {CHL  HP Upstream Pharmacy Plans:[phone]}     Hyperlipidemia   LDL goal < ***  Lipid Panel     Component Value Date/Time   CHOL 328 (H) 07/09/2019 1007   TRIG 123.0 07/09/2019 1007   HDL 62.30 07/09/2019 1007   LDLCALC 241 (H) 07/09/2019 1007   LDLDIRECT 230.7 03/18/2013 0805    Hepatic Function Latest Ref Rng & Units 07/09/2019 10/30/2018 06/02/2018  Total Protein 6.0 - 8.3 g/dL 7.9 8.1 8.0  Albumin 3.5 - 5.2 g/dL 4.2 4.4 4.1  AST 0 - 37 U/L '16 16 13  ' ALT 0 - 35 U/L '9 10 8  ' Alk Phosphatase 39 - 117 U/L 58 71 81  Total Bilirubin 0.2 - 1.2 mg/dL 0.5 0.5 0.3  Bilirubin, Direct 0.0 -  0.3 mg/dL - - -     The ASCVD Risk score Mikey Bussing DC Jr., et al., 2013) failed to calculate for the following reasons:   The 2013 ASCVD risk score is only valid for ages 75 to 42   Patient has failed these meds in past: atorvastatin, ezetimibe, fenofibrate, pravastatin, rosuvastatin, simvastatin Patient is currently {CHL Controlled/Uncontrolled:[phone]} on the following medications:  . Pitavastatin 4 mg daily   We discussed:  {CHL HP Upstream Pharmacy [phone]}  Plan  Continue {CHL HP Upstream Pharmacy Plans:[phone]}  Chronic Pain   Peripheral neuropathy Degenerative joint disease  Patient has failed these meds in past: *** Patient is currently {CHL Controlled/Uncontrolled:[phone]} on the following medications:  . Gabapentin 100 mg 1-2 caps daily  . Meloxicam 15 mg daily PRN  . Methocarbamol 500 mg q8hr PRN  We discussed:  ***  Plan  Continue {CHL HP Upstream Pharmacy Plans:[phone]}  Osteopenia    Last DEXA Scan: 05/08/18   T-Score femoral neck: -1.5  T-Score total hip: -1.5  T-Score lumbar spine: -1.3  T-Score forearm radius: -1.0  10-year probability of major osteoporotic fracture: 5.3%  10-year probability of hip fracture: 1.3%  Vit D, 25-Hydroxy  Date Value Ref Range Status  09/29/2009 39 30 - 89 ng/mL Final    Comment:    See lab report for associated  comment(s)     Patient {is;is not an osteoporosis candidate:23886}  Patient has failed these meds in past: *** Patient is currently {CHL Controlled/Uncontrolled:[phone]} on the following medications:  . ***  We discussed:  {Osteoporosis Counseling:23892}  Plan  Continue {CHL HP Upstream Pharmacy Plans:[phone]}   Misc / OTC    Patient has failed these meds in past: *** Patient is currently {CHL Controlled/Uncontrolled:[phone]} on the following medications:  . Ofloxacin 0.3% otic solution 10 drops both ears daily   We discussed:  ***  Plan  Continue {CHL HP Upstream Pharmacy SKAJG:[id number]}  Vaccines   Reviewed and discussed patient's vaccination history.    Immunization History  Administered Date(s) Administered  . PFIZER SARS-COV-2 Vaccination 06/01/2019, 06/23/2019  . Td 03/19/2002    Plan  Recommended patient receive *** vaccine in *** office.   Medication Management   Pt uses [facility] for all medications Uses pill box? {Yes or If no, why not?:20788} Pt endorses ***% compliance  We discussed: ***  Plan  {US Pharmacy TDHR:41638}    Follow up: *** month phone visit  ***

## 2019-09-11 ENCOUNTER — Ambulatory Visit: Payer: Medicare Other | Admitting: Pharmacist

## 2019-09-11 ENCOUNTER — Other Ambulatory Visit: Payer: Self-pay

## 2019-09-11 VITALS — BP 138/71 | Temp 96.8°F | Wt 214.6 lb

## 2019-09-11 DIAGNOSIS — IMO0002 Reserved for concepts with insufficient information to code with codable children: Secondary | ICD-10-CM

## 2019-09-11 DIAGNOSIS — E1169 Type 2 diabetes mellitus with other specified complication: Secondary | ICD-10-CM

## 2019-09-11 DIAGNOSIS — I10 Essential (primary) hypertension: Secondary | ICD-10-CM

## 2019-09-11 DIAGNOSIS — J4540 Moderate persistent asthma, uncomplicated: Secondary | ICD-10-CM

## 2019-09-11 NOTE — Patient Instructions (Addendum)
Visit Information  Goals Addressed            This Visit's Progress   . [place] (see longitudinal plan of care for additional care plan information)  Current Barriers:  . Chronic Disease Management support, education, and care coordination needs related to Asthma, Diabetes, Hypertension, Hyperlipidemia, Chronic Pain, Osteopenia   Hypertension BP Readings from Last 3 Encounters:  09/11/19 138/71  07/09/19 120/80  04/30/19 (!) 143/74   . Pharmacist Clinical Goal(s): o Over the next 90 days, patient will work with PharmD and providers to maintain BP goal <140/90 . Current regimen:  . Amlodipine 5 mg daily  . Hydrochlorothiazide 25 mg daily . Interventions: o Requested patient to record BP when checking daily o Confirm if patient is taking amlodipine and hydrochlorothiazide (hctz) . Patient self care activities - Over the next 90 days, patient will: o Check BP daily, document, and provide at future appointments o Ensure daily salt intake < 2300 mg/Felicia Martin  Hyperlipidemia Lipid Panel     Component Value Date/Time   CHOL 328 (H) 07/09/2019 1007   TRIG 123.0 07/09/2019 1007   HDL 62.30 07/09/2019 1007   CHOLHDL 5 07/09/2019 1007   VLDL 24.6 07/09/2019 1007   LDLCALC 241 (H) 07/09/2019 1007   LDLDIRECT 230.7 03/18/2013 0805   . Pharmacist Clinical Goal(s): o Over the next 180 days, patient will work with PharmD and providers to achieve LDL goal <100, Total Cholesterol (CHOL) goal <200 or prevent continue upward trend . Current regimen:  o Pitavastatin 4mg  (prescribed, but patient not taking) . Interventions: o Discussed importance of LDL goal . Patient self care activities - Over the next 180 days, patient will: o Try to reduce cholesterol intake  Diabetes Lab Results  Component Value Date/Time   HGBA1C 6.8 (H) 07/09/2019 10:07 AM   HGBA1C 6.3 10/30/2018 09:53 AM   . Pharmacist Clinical Goal(s): o Over the next  90 days, patient will work with PharmD and providers to maintain A1c goal <7% . Current regimen:  o Metformin XR 500 mg daily  . Interventions: o Discussed the importance of limiting carbohydrates (30-45grams per meal) . Patient self care activities - Over the next 90 days, patient will: o Check blood sugar 3-4 times per week document, and provide at future appointments o Contact provider with any episodes of hypoglycemia  Asthma . Pharmacist Clinical Goal(s) o Over the next 90 days, patient will work with PharmD and providers to reduce symptoms of asthma . Current regimen:  o Albuterol HFA 108 mcg 2 puff q6hr PRN  o Flovent 110 mcg/act 2 puff BID  . Interventions: o Discussed the difference between Flovent and albuterol o Recommended patient to use Flovent daily to see if this decreases her shortness of breath with walking short distances . Patient self care activities - Over the next 90 days, patient will: o Start taking Flovent 2 puffs twice daily  Medication management . Pharmacist Clinical Goal(s): o Over the next 90 days, patient will work with PharmD and providers to achieve optimal medication adherence . Current pharmacy: [facility] . Interventions o Comprehensive medication review performed. o Continue current medication management strategy . Patient self care activities - Over the next 90 days, patient will: o Focus on medication adherence by filling and taking medications appropriately  o Take medications as prescribed o Report any questions or concerns to PharmD and/or provider(s)  Initial goal documentation  Felicia Martin was given information about Chronic Care Management services today including:  1. CCM service includes personalized support from designated clinical staff supervised by her physician, including individualized plan of care and coordination with other care providers 2. 24/7 contact phone numbers for assistance for urgent and routine care  needs. 3. Standard insurance, coinsurance, copays and deductibles apply for chronic care management only during months in which we provide at least 20 minutes of these services. Most insurances cover these services at 100%, however patients may be responsible for any copay, coinsurance and/or deductible if applicable. This service may help you avoid the need for more expensive face-to-face services. 4. Only one practitioner may furnish and bill the service in a calendar month. 5. The patient may stop CCM services at any time (effective at the end of the month) by phone call to the office staff.  Patient agreed to services and verbal consent obtained.   The patient verbalized understanding of instructions provided today and agreed to receive a mailed copy of patient instruction and/or educational materials. Telephone follow up appointment with pharmacy team member scheduled for: 10/05/2019  Felicia Martin, PharmD Clinical Pharmacist [facility] [phone]    Cholesterol Content in Foods Cholesterol is a waxy, fat-like substance that helps to carry fat in the blood. The body needs cholesterol in small amounts, but too much cholesterol can cause damage to the arteries and heart. Most people should eat less than 200 milligrams (mg) of cholesterol a Felicia Martin. Foods with cholesterol  Cholesterol is found in animal-based foods, such as meat, seafood, and dairy. Generally, low-fat dairy and lean meats have less cholesterol than full-fat dairy and fatty meats. The milligrams of cholesterol per serving (mg per serving) of common cholesterol-containing foods are listed below. Meat and other proteins  Egg -- one large whole egg has 186 mg.  Veal shank -- 4 oz has 141 mg.  Lean ground [place] (93% lean) -- 4 oz has 118 mg.  Fat-trimmed lamb loin -- 4 oz has 106 mg.  Lean ground beef (90% lean) -- 4 oz has 100 mg.  Lobster -- 3.5 oz has 90 mg.  Pork loin chops -- 4 oz  has 86 mg.  Canned salmon -- 3.5 oz has 83 mg.  Fat-trimmed beef top loin -- 4 oz has 78 mg.  Frankfurter -- 1 frank (3.5 oz) has 77 mg.  Crab -- 3.5 oz has 71 mg.  Roasted chicken without skin, white meat -- 4 oz has 66 mg.  Light bologna -- 2 oz has 45 mg.  Deli-cut [place] -- 2 oz has 31 mg.  Canned tuna -- 3.5 oz has 31 mg.  Berniece Salines -- 1 oz has 29 mg.  Oysters and mussels (raw) -- 3.5 oz has 25 mg.  Mackerel -- 1 oz has 22 mg.  Trout -- 1 oz has 20 mg.  Pork sausage -- 1 link (1 oz) has 17 mg.  Salmon -- 1 oz has 16 mg.  Tilapia -- 1 oz has 14 mg. Dairy  Soft-serve ice cream --  cup (4 oz) has 103 mg.  Whole-milk yogurt -- 1 cup (8 oz) has 29 mg.  Cheddar cheese -- 1 oz has 28 mg.  American cheese -- 1 oz has 28 mg.  Whole milk -- 1 cup (8 oz) has 23 mg.  2% milk -- 1 cup (8 oz) has 18 mg.  Cream cheese -- 1 tablespoon (Tbsp) has 15 mg.  Cottage cheese --  cup (4 oz) has 14 mg.  Low-fat (1%) milk -- 1 cup (8 oz) has 10 mg.  Sour cream -- 1 Tbsp has 8.5 mg.  Low-fat yogurt -- 1 cup (8 oz) has 8 mg.  Nonfat Greek yogurt -- 1 cup (8 oz) has 7 mg.  Half-and-half cream -- 1 Tbsp has 5 mg. Fats and oils  Cod liver oil -- 1 tablespoon (Tbsp) has 82 mg.  Butter -- 1 Tbsp has 15 mg.  Lard -- 1 Tbsp has 14 mg.  Bacon grease -- 1 Tbsp has 14 mg.  Mayonnaise -- 1 Tbsp has 5-10 mg.  Margarine -- 1 Tbsp has 3-10 mg. Exact amounts of cholesterol in these foods may vary depending on specific ingredients and brands. Foods without cholesterol Most plant-based foods do not have cholesterol unless you combine them with a food that has cholesterol. Foods without cholesterol include:  Grains and cereals.  Vegetables.  Fruits.  Vegetable oils, such as olive, canola, and sunflower oil.  Legumes, such as peas, beans, and lentils.  Nuts and seeds.  Egg whites. Summary  The body needs cholesterol in small amounts, but too much cholesterol can cause  damage to the arteries and heart.  Most people should eat less than 200 milligrams (mg) of cholesterol a Fouad Taul. This information is not intended to replace advice given to you by your health care provider. Make sure you discuss any questions you have with your health care provider. Document Revised: 02/15/2017 Document Reviewed: 10/30/2016 Elsevier Patient Education  [place].

## 2019-09-11 NOTE — Chronic Care Management (AMB) (Signed)
Chronic Care Management Pharmacy  Name: LEAHANN LEMPKE  MRN: [id number] DOB: [birth year]   Chief Complaint/ HPI  Felicia Martin,  86 y.o. , female presents for their Initial CCM visit with the clinical pharmacist In office (originally scheduled for telephone, but patient appeared in clinic).  PCP : Ann Held, DO  Their chronic conditions include: Asthma, Diabetes, Hypertension, Hyperlipidemia, Chronic Pain, Osteopenia  Office Visits: 07/09/19: Patient presented to Dr. Carollee Herter for follow-up. Patient started on gabapentin for peripheral neuropathy. A1c worsened to 6.8%, cholesterol worsened.   04/30/19: Patient presented to Naaman Plummer, RN for AWV.   Consult Visit: None in past 6 months  Allergies  Allergen Reactions  . Statins Other (See Comments)    Muscle aches and weakness  . Zetia [Ezetimibe] Other (See Comments)    Muscle pain  . Fenofibrate Rash    Medications: Outpatient Encounter Medications as of 09/11/2019  Medication Sig  . albuterol (VENTOLIN HFA) 108 (90 Base) MCG/ACT inhaler Inhale 2 puffs into the lungs every 6 (six) hours as needed.  Marland Kitchen amLODipine (NORVASC) 5 MG tablet Take 1 tablet (5 mg total) by mouth daily.  Marland Kitchen gabapentin (NEURONTIN) 100 MG capsule Take one tablet by mouth for 3-4 days, then increase to two tablets by mouth daily.  . metFORMIN (GLUCOPHAGE XR) 500 MG 24 hr tablet Take 1 tablet (500 mg total) by mouth daily with breakfast. NEEDS OV/FOLLOW UP  . Blood Glucose Monitoring Suppl (ONETOUCH VERIO) w/Device KIT Use as directed once a Bonifacio Pruden.  Dx code: E11.9  . fluticasone (FLOVENT HFA) 110 MCG/ACT inhaler Inhale 2 puffs into the lungs 2 (two) times daily. (Patient not taking: Reported on 04/30/2019)  . glucose blood (ONETOUCH VERIO) test strip Use as instructed once a Colletta Spillers.  Dx Code: E11.9  . hydrochlorothiazide (HYDRODIURIL) 25 MG tablet Take 1 tablet (25 mg total) by mouth daily.  . Lancets (ONETOUCH DELICA PLUS [id number]) MISC  1 each by Does not apply route daily. DX CODE: E11.9  . meloxicam (MOBIC) 15 MG tablet TAKE 1 TABLET BY MOUTH EVERY Larrisha Babineau AS NEEDED FOR PAIN (Patient not taking: Reported on 09/11/2019)  . methocarbamol (ROBAXIN) 500 MG tablet Take 1 tablet (500 mg total) by mouth every 8 (eight) hours as needed for muscle spasms. (Patient not taking: Reported on 09/11/2019)  . ofloxacin (FLOXIN OTIC) 0.3 % OTIC solution Place 10 drops into both ears daily.  . Pitavastatin Calcium 4 MG TABS Take 1 tablet (4 mg total) by mouth at bedtime. (Patient not taking: Reported on 04/30/2019)   No facility-administered encounter medications on file as of 09/11/2019.   SDOH Screenings   Alcohol Screen:   . Last Alcohol Screening Score (AUDIT):   Depression (PHQ2-9): Low Risk   . PHQ-2 Score: 0  Financial Resource Strain: Low Risk   . Difficulty of Paying Living Expenses: Not hard at all  Food Insecurity:   . Worried About Charity fundraiser in the Last Year:   . [place] in the Last Year:   Housing:   . Last Housing Risk Score:   Physical Activity:   . Days of Exercise per Week:   . Minutes of Exercise per Session:   Social Connections:   . Frequency of Communication with Friends and Family:   . Frequency of Social Gatherings with Friends and Family:   . Attends Religious Services:   . Active Member of Clubs or Organizations:   . Attends Archivist Meetings:   .  Marital Status:   Stress:   . Feeling of Stress :   Tobacco Use: Medium Risk  . Smoking Tobacco Use: Former Smoker  . Smokeless Tobacco Use: Never Used  Transportation Needs: No Transportation Needs  . Lack of Transportation (Medical): No  . Lack of Transportation (Non-Medical): No     Current Diagnosis/Assessment:    Goals Addressed            This Visit's Progress   . [place] (see longitudinal plan of care for additional care plan information)  Current Barriers:    . Chronic Disease Management support, education, and care coordination needs related to Asthma, Diabetes, Hypertension, Hyperlipidemia, Chronic Pain, Osteopenia   Hypertension BP Readings from Last 3 Encounters:  09/11/19 138/71  07/09/19 120/80  04/30/19 (!) 143/74   . Pharmacist Clinical Goal(s): o Over the next 90 days, patient will work with PharmD and providers to maintain BP goal <140/90 . Current regimen:  . Amlodipine 5 mg daily  . Hydrochlorothiazide 25 mg daily . Interventions: o Requested patient to record BP when checking daily o Confirm if patient is taking amlodipine and hydrochlorothiazide (hctz) . Patient self care activities - Over the next 90 days, patient will: o Check BP daily, document, and provide at future appointments o Ensure daily salt intake < 2300 mg/Shaily Librizzi  Hyperlipidemia Lipid Panel     Component Value Date/Time   CHOL 328 (H) 07/09/2019 1007   TRIG 123.0 07/09/2019 1007   HDL 62.30 07/09/2019 1007   CHOLHDL 5 07/09/2019 1007   VLDL 24.6 07/09/2019 1007   LDLCALC 241 (H) 07/09/2019 1007   LDLDIRECT 230.7 03/18/2013 0805   . Pharmacist Clinical Goal(s): o Over the next 180 days, patient will work with PharmD and providers to achieve LDL goal <100, Total Cholesterol (CHOL) goal <200 or prevent continue upward trend . Current regimen:  o Pitavastatin 19m (prescribed, but patient not taking) . Interventions: o Discussed importance of LDL goal . Patient self care activities - Over the next 180 days, patient will: o Try to reduce cholesterol intake  Diabetes Lab Results  Component Value Date/Time   HGBA1C 6.8 (H) 07/09/2019 10:07 AM   HGBA1C 6.3 10/30/2018 09:53 AM   . Pharmacist Clinical Goal(s): o Over the next 90 days, patient will work with PharmD and providers to maintain A1c goal <7% . Current regimen:  o Metformin XR 500 mg daily  . Interventions: o Discussed the importance of limiting carbohydrates (30-45grams per meal) . Patient  self care activities - Over the next 90 days, patient will: o Check blood sugar 3-4 times per week document, and provide at future appointments o Contact provider with any episodes of hypoglycemia  Asthma . Pharmacist Clinical Goal(s) o Over the next 90 days, patient will work with PharmD and providers to reduce symptoms of asthma . Current regimen:  o Albuterol HFA 108 mcg 2 puff q6hr PRN  o Flovent 110 mcg/act 2 puff BID  . Interventions: o Discussed the difference between Flovent and albuterol o Recommended patient to use Flovent daily to see if this decreases her shortness of breath with walking short distances . Patient self care activities - Over the next 90 days, patient will: o Start taking Flovent 2 puffs twice daily  Medication management . Pharmacist Clinical Goal(s): o Over the next 90 days, patient will work with PharmD and providers to achieve optimal medication adherence . Current pharmacy: [facility] .  Interventions o Comprehensive medication review performed. o Continue current medication management strategy . Patient self care activities - Over the next 90 days, patient will: o Focus on medication adherence by filling and taking medications appropriately  o Take medications as prescribed o Report any questions or concerns to PharmD and/or provider(s)  Initial goal documentation      Social Hx:  Has 4 daughters. Has one that lives down the street. [place], [place], [place]  Divorced and widowed. She has grand children, great grandchildren, and great grand children Retired from 2005. 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 She then went to work at [employer] for 13 years and finally retired from there. Has been retired from [employer] x 2 years.  Movement/Exercise Patient comes into the office using cane. She states she overdid it in her garden yesterday.  States she uses a foot pedaling machine about 2-3 hours per Shi Blankenship while watching TV. She  can't stand long due to back pain or walk far distances due to getting SOB.   Diet B - sausage biscuit from [employer]- Salad w/ [place] D - mostly eats out   Asthma    Last spirometry score: FEV1 84% (08/21/06)  Eosinophil count:   Lab Results  Component Value Date/Time   EOSPCT 5.9 (H) 12/16/2013 08:10 AM  %                               Eos (Absolute):  Lab Results  Component Value Date/Time   EOSABS 0.3 12/16/2013 08:10 AM    Tobacco Status:  Social History   Tobacco Use  Smoking Status Former Smoker  . Packs/Collen Vincent: 0.50  . Years: 40.00  . Pack years: 20.00  . Types: Cigarettes  . Quit date: 05/25/1999  . Years since quitting: 20.3  Smokeless Tobacco Never Used    Patient has failed these meds in past: Qvar (cost) Patient is currently uncontrolled on the following medications:   Albuterol HFA 108 mcg 2 puff q6hr PRN   Flovent 110 mcg/act 2 puff BID   Using maintenance inhaler regularly? No Frequency of rescue inhaler use:  several times per month (about once a month)  Gets short of breath with walking and when humity increases. Gets short of breath, but not enough for her to feel like she needs to use albuterol.  Patient seems winded as we walk to the exam room. She admits that she can't walk far without getting winded.   We discussed:  How taking flovent daily could assist patient with potentially increasing her walking stamina without getting short of breath. She is open to starting Flovent daily. Discussed the difference between Flovent (steroid/reduce inflammation) and albuterol ("lung opener" for when she gets winded)   Patient verbalized understanding and agreed to trying Flovent 2 puffs twice daily  Plan -Start using Flovent 164mg 2 puffs twice daily -Continue current medications  Diabetes   A1c goal <7%  Recent Relevant Labs: Lab Results  Component Value Date/Time   HGBA1C 6.8 (H) 07/09/2019 10:07 AM   HGBA1C 6.3 10/30/2018 09:53 AM   GFR 67.74  07/09/2019 10:07 AM   GFR 77.14 10/30/2018 09:53 AM   MICROALBUR 1.9 07/09/2019 10:07 AM   MICROALBUR 1.0 10/30/2018 09:53 AM    Last diabetic Eye exam: No results found for: HMDIABEYEEXA  Last diabetic Foot exam: No results found for: HMDIABFOOTEX   Checking BG: Weekly (3-4 times per week)  Recent FBG Readings: "In range" 120s, lowest 98-282 after eating a honey bun Patient has failed these meds in past: None noted  Patient is currently controlled, but trending up on the following medications: Marland Kitchen Metformin XR 500 mg daily   States she was taking metformin twice daily, but the second dose caused her to be nauseous. She is tolerating the once daily dose ok.   We discussed: how her a1c is trending up and she is on the borderline of not being within goal. We discussed limiting carbohydrates.   Plan -Continue current medications  Hypertension   BP goal is:  <140/90  Office blood pressures are  BP Readings from Last 3 Encounters:  09/11/19 138/71  07/09/19 120/80  04/30/19 (!) 143/74   CMP Latest Ref Rng & Units 07/09/2019 10/30/2018 06/02/2018  Glucose 70 - 99 mg/dL 121(H) 107(H) 146(H)  BUN 6 - 23 mg/dL 23 24(H) 23  Creatinine 0.40 - 1.20 mg/dL 0.95 0.85 0.99  Sodium 135 - 145 mEq/L 138 137 137  Potassium 3.5 - 5.1 mEq/L 3.9 4.0 4.1  Chloride 96 - 112 mEq/L 102 102 103  CO2 19 - 32 mEq/L _0 Calcium 8.4 - 10.5 mg/dL 9.9 9.7 9.8  Total Protein 6.0 - 8.3 g/dL 7.9 8.1 8.0  Total Bilirubin 0.2 - 1.2 mg/dL 0.5 0.5 0.3  Alkaline Phos 39 - 117 U/L 58 71 81  AST 0 - 37 U/L _1 ALT 0 - 35 U/L _2 Patient checks BP at home daily Patient home BP readings are ranging: Reports highest are in the 140s   Patient has failed these meds in the past: None noted  Patient is currently controlled on the following medications:  . Amlodipine 5 mg daily  . HCTZ 25 mg daily (pt is unsure if she is taking this medication)  Neither medication listed in dispense report from last 6  months.   Almost out of BP meds  We discussed importance of verifying which medications she is taking and medication adherence  Plan -Continue current medications  -Record BP when checking daily -Confirm if you have hctz at home    Hyperlipidemia   LDL goal <100  Lipid Panel     Component Value Date/Time   CHOL 328 (H) 07/09/2019 1007   TRIG 123.0 07/09/2019 1007   HDL 62.30 07/09/2019 1007   LDLCALC 241 (H) 07/09/2019 1007   LDLDIRECT 230.7 03/18/2013 0805    Hepatic Function Latest Ref Rng & Units 07/09/2019 10/30/2018 06/02/2018  Total Protein 6.0 - 8.3 g/dL 7.9 8.1 8.0  Albumin 3.5 - 5.2 g/dL 4.2 4.4 4.1  AST 0 - 37 U/L _3 ALT 0 - 35 U/L _4 Alk Phosphatase 39 - 117 U/L 58 71 81  Total Bilirubin 0.2 - 1.2 mg/dL 0.5 0.5 0.3  Bilirubin, Direct 0.0 - 0.3 mg/dL - - -     The ASCVD Risk score ([place]., et al., 2013) failed to calculate for the following reasons:   The 2013 ASCVD risk score is only valid for ages 66 to 11   Patient has failed these meds in past: atorvastatin, ezetimibe, fenofibrate, pravastatin, rosuvastatin, simvastatin Patient is currently uncontrolled on the following medications:  . Pitavastatin 4 mg daily (not taking)  States she is not taking anything for her cholesterol because of muscle aches; Cramps in her legs  States her leg cramping will do well for a while then  start back while taking cholesterol medication.  We discussed:  importance of LDL goal noting patient's resistance to cholesterol medications  Plan -Consider alternative dosing of statin medication (every other Loletha Bertini or twice weekly dosing to get some effect on LDL)  Chronic Pain   Peripheral neuropathy Degenerative joint disease  Patient has failed these meds in past: None noted  Patient is currently controlled on the following medications:  . Gabapentin 100 mg 1-2 caps daily (taking 2 per Amyiah Gaba and feels this is really helping) . Meloxicam 15 mg daily PRN (does  not take any more) . Methocarbamol 500 mg q8hr PRN (dose not take any more)  Meloxicam and methocarb stopped when cholesterol med stopped   Plan -Continue current medications  Osteopenia    Last DEXA Scan: 05/08/18   T-Score femoral neck: -1.5  T-Score total hip: -1.5  T-Score lumbar spine: -1.3  T-Score forearm radius: -1.0  10-year probability of major osteoporotic fracture: 5.3%  10-year probability of hip fracture: 1.3%  Vit D, 25-Hydroxy  Date Value Ref Range Status  09/29/2009 39 30 - 89 ng/mL Final    Comment:    See lab report for associated comment(s)     Patient is not a candidate for pharmacologic treatment  Patient has failed these meds in past: None noted  Patient is currently uncontrolled on the following medications: None  We discussed:  Recommend [phone] units of vitamin D daily. Recommend 1200 mg of calcium daily from dietary and supplemental sources.  Plan -Consider intake of 1274m of calcium daily through diet and/or supplementation -Consider intake of [phone] units of vitamin D through supplementation

## 2019-10-05 ENCOUNTER — Encounter: Payer: Self-pay | Admitting: Family Medicine

## 2019-10-05 ENCOUNTER — Other Ambulatory Visit: Payer: Self-pay

## 2019-10-05 ENCOUNTER — Ambulatory Visit: Payer: Medicare Other | Admitting: Pharmacist

## 2019-10-05 ENCOUNTER — Ambulatory Visit (INDEPENDENT_AMBULATORY_CARE_PROVIDER_SITE_OTHER): Payer: Medicare Other | Admitting: Family Medicine

## 2019-10-05 VITALS — BP 140/78 | HR 71 | Temp 98.2°F | Resp 18 | Ht 60.0 in | Wt 211.2 lb

## 2019-10-05 DIAGNOSIS — M5135 Other intervertebral disc degeneration, thoracolumbar region: Secondary | ICD-10-CM | POA: Diagnosis not present

## 2019-10-05 DIAGNOSIS — E1165 Type 2 diabetes mellitus with hyperglycemia: Secondary | ICD-10-CM | POA: Diagnosis not present

## 2019-10-05 DIAGNOSIS — E1140 Type 2 diabetes mellitus with diabetic neuropathy, unspecified: Secondary | ICD-10-CM | POA: Diagnosis not present

## 2019-10-05 DIAGNOSIS — I10 Essential (primary) hypertension: Secondary | ICD-10-CM

## 2019-10-05 DIAGNOSIS — E785 Hyperlipidemia, unspecified: Secondary | ICD-10-CM

## 2019-10-05 DIAGNOSIS — E1169 Type 2 diabetes mellitus with other specified complication: Secondary | ICD-10-CM

## 2019-10-05 LAB — MICROALBUMIN / CREATININE URINE RATIO
Creatinine,U: 117.3 mg/dL
Microalb Creat Ratio: 0.8 mg/g (ref 0.0–30.0)
Microalb, Ur: 1 mg/dL (ref 0.0–1.9)

## 2019-10-05 LAB — LIPID PANEL
Cholesterol: 291 mg/dL — ABNORMAL HIGH (ref 0–200)
HDL: 57 mg/dL (ref >39.00)
LDL Cholesterol: 213 mg/dL — ABNORMAL HIGH (ref 0–99)
NonHDL: 233.51
Total CHOL/HDL Ratio: 5
Triglycerides: 104 mg/dL (ref 0.0–149.0)
VLDL: 20.8 mg/dL (ref 0.0–40.0)

## 2019-10-05 LAB — COMPREHENSIVE METABOLIC PANEL
ALT: 7 U/L (ref 0–35)
AST: 14 U/L (ref 0–37)
Albumin: 4.2 g/dL (ref 3.5–5.2)
Alkaline Phosphatase: 63 U/L (ref 39–117)
BUN: 26 mg/dL — ABNORMAL HIGH (ref 6–23)
CO2: 24 mEq/L (ref 19–32)
Calcium: 9.8 mg/dL (ref 8.4–10.5)
Chloride: 104 mEq/L (ref 96–112)
Creatinine, Ser: 0.81 mg/dL (ref 0.40–1.20)
GFR: 81.37 mL/min (ref >60.00)
Glucose, Bld: 106 mg/dL — ABNORMAL HIGH (ref 70–99)
Potassium: 4.4 mEq/L (ref 3.5–5.1)
Sodium: 138 mEq/L (ref 135–145)
Total Bilirubin: 0.4 mg/dL (ref 0.2–1.2)
Total Protein: 7.9 g/dL (ref 6.0–8.3)

## 2019-10-05 LAB — HEMOGLOBIN A1C: Hgb A1c MFr Bld: 6.6 % — ABNORMAL HIGH (ref 4.6–6.5)

## 2019-10-05 MED ORDER — PREGABALIN 50 MG PO CAPS: 50.0000 mg | ORAL_CAPSULE | Freq: Two times a day (BID) | ORAL | 1 refills | Status: DC

## 2019-10-05 MED ORDER — MELOXICAM 15 MG PO TABS: ORAL_TABLET | ORAL | 2 refills | Status: DC

## 2019-10-05 NOTE — Patient Instructions (Signed)
Carbohydrate Counting for Diabetes Mellitus, Adult  Carbohydrate counting is a method of keeping track of how many carbohydrates you eat. Eating carbohydrates naturally increases the amount of sugar (glucose) in the blood. Counting how many carbohydrates you eat helps keep your blood glucose within normal limits, which helps you manage your diabetes (diabetes mellitus). It is important to know how many carbohydrates you can safely have in each meal. This is different for every person. A diet and nutrition specialist (registered dietitian) can help you make a meal plan and calculate how many carbohydrates you should have at each meal and snack. Carbohydrates are found in the following foods:  Grains, such as breads and cereals.  Dried beans and soy products.  Starchy vegetables, such as potatoes, peas, and corn.  Fruit and fruit juices.  Milk and yogurt.  Sweets and snack foods, such as cake, cookies, candy, chips, and soft drinks. How do I count carbohydrates? There are two ways to count carbohydrates in food. You can use either of the methods or a combination of both. Reading "Nutrition Facts" on packaged food The "Nutrition Facts" list is included on the labels of almost all packaged foods and beverages in the [place] It includes:  The serving size.  Information about nutrients in each serving, including the grams (g) of carbohydrate per serving. To use the "Nutrition Facts":  Decide how many servings you will have.  Multiply the number of servings by the number of carbohydrates per serving.  The resulting number is the total amount of carbohydrates that you will be having. Learning standard serving sizes of other foods When you eat carbohydrate foods that are not packaged or do not include "Nutrition Facts" on the label, you need to measure the servings in order to count the amount of carbohydrates:  Measure the foods that you will eat with a food scale or measuring cup, if  needed.  Decide how many standard-size servings you will eat.  Multiply the number of servings by 15. Most carbohydrate-rich foods have about 15 g of carbohydrates per serving. ? For example, if you eat 8 oz (170 g) of strawberries, you will have eaten 2 servings and 30 g of carbohydrates (2 servings x 15 g = 30 g).  For foods that have more than one food mixed, such as soups and casseroles, you must count the carbohydrates in each food that is included. The following list contains standard serving sizes of common carbohydrate-rich foods. Each of these servings has about 15 g of carbohydrates:   hamburger bun or  English muffin.   oz (15 mL) syrup.   oz (14 g) jelly.  1 slice of bread.  1 six-inch tortilla.  3 oz (85 g) cooked rice or pasta.  4 oz (113 g) cooked dried beans.  4 oz (113 g) starchy vegetable, such as peas, corn, or potatoes.  4 oz (113 g) hot cereal.  4 oz (113 g) mashed potatoes or  of a large baked potato.  4 oz (113 g) canned or frozen fruit.  4 oz (120 mL) fruit juice.  4-6 crackers.  6 chicken nuggets.  6 oz (170 g) unsweetened dry cereal.  6 oz (170 g) plain fat-free yogurt or yogurt sweetened with artificial sweeteners.  8 oz (240 mL) milk.  8 oz (170 g) fresh fruit or one small piece of fruit.  24 oz (680 g) popped popcorn. Example of carbohydrate counting Sample meal  3 oz (85 g) chicken breast.  6 oz (170 g)   brown rice.  4 oz (113 g) corn.  8 oz (240 mL) milk.  8 oz (170 g) strawberries with sugar-free whipped topping. Carbohydrate calculation 1. Identify the foods that contain carbohydrates: ? Rice. ? Corn. ? Milk. ? Strawberries. 2. Calculate how many servings you have of each food: ? 2 servings rice. ? 1 serving corn. ? 1 serving milk. ? 1 serving strawberries. 3. Multiply each number of servings by 15 g: ? 2 servings rice x 15 g = 30 g. ? 1 serving corn x 15 g = 15 g. ? 1 serving milk x 15 g = 15 g. ? 1  serving strawberries x 15 g = 15 g. 4. Add together all of the amounts to find the total grams of carbohydrates eaten: ? 30 g + 15 g + 15 g + 15 g = 75 g of carbohydrates total. Summary  Carbohydrate counting is a method of keeping track of how many carbohydrates you eat.  Eating carbohydrates naturally increases the amount of sugar (glucose) in the blood.  Counting how many carbohydrates you eat helps keep your blood glucose within normal limits, which helps you manage your diabetes.  A diet and nutrition specialist (registered dietitian) can help you make a meal plan and calculate how many carbohydrates you should have at each meal and snack. This information is not intended to replace advice given to you by your health care provider. Make sure you discuss any questions you have with your health care provider. Document Revised: 09/27/2016 Document Reviewed: 08/17/2015 Elsevier Patient Education  2020 Elsevier Inc.  

## 2019-10-05 NOTE — Chronic Care Management (AMB) (Signed)
Chronic Care Management Pharmacy  Name: Felicia Martin  MRN: [id number] DOB: [birth year]   Chief Complaint/ HPI  Felicia Martin,  86 y.o. , female presents for their Follow-Up CCM visit with the clinical pharmacist via telephone due to COVID-19 Pandemic   PCP : Ann Held, DO  Their chronic conditions include: Asthma, Diabetes, Hypertension, Hyperlipidemia, Chronic Pain, Osteopenia  Office Visits: 10/05/19: Visit w/ Dr. Etter Sjogren - Consider lipid clinic. Pt unable to tolerate zetia or fenofibrate   Consult Visit: None since last CCM visit on 09/11/19   Allergies  Allergen Reactions  . Statins Other (See Comments)    Muscle aches and weakness  . Zetia [Ezetimibe] Other (See Comments)    Muscle pain  . Fenofibrate Rash    Medications: Outpatient Encounter Medications as of 10/05/2019  Medication Sig  . albuterol (VENTOLIN HFA) 108 (90 Base) MCG/ACT inhaler Inhale 2 puffs into the lungs every 6 (six) hours as needed.  Marland Kitchen amLODipine (NORVASC) 5 MG tablet Take 1 tablet (5 mg total) by mouth daily.  . Blood Glucose Monitoring Suppl (ONETOUCH VERIO) w/Device KIT Use as directed once a Rex Magee.  Dx code: E11.9  . glucose blood (ONETOUCH VERIO) test strip Use as instructed once a Pasqualina Colasurdo.  Dx Code: E11.9  . hydrochlorothiazide (HYDRODIURIL) 25 MG tablet Take 1 tablet (25 mg total) by mouth daily.  . Lancets (ONETOUCH DELICA PLUS [id number]) MISC 1 each by Does not apply route daily. DX CODE: E11.9  . meloxicam (MOBIC) 15 MG tablet TAKE 1 TABLET BY MOUTH EVERY Lashaye Fisk AS NEEDED FOR PAIN  . metFORMIN (GLUCOPHAGE XR) 500 MG 24 hr tablet Take 1 tablet (500 mg total) by mouth daily with breakfast. NEEDS OV/FOLLOW UP  . ofloxacin (FLOXIN OTIC) 0.3 % OTIC solution Place 10 drops into both ears daily.  . pregabalin (LYRICA) 50 MG capsule Take 1 capsule (50 mg total) by mouth 2 (two) times daily.  . [DISCONTINUED] fluticasone (FLOVENT HFA) 110 MCG/ACT inhaler Inhale 2 puffs into the lungs 2  (two) times daily. (Patient not taking: Reported on 04/30/2019)  . [DISCONTINUED] methocarbamol (ROBAXIN) 500 MG tablet Take 1 tablet (500 mg total) by mouth every 8 (eight) hours as needed for muscle spasms. (Patient not taking: Reported on 09/11/2019)  . [DISCONTINUED] Pitavastatin Calcium 4 MG TABS Take 1 tablet (4 mg total) by mouth at bedtime. (Patient not taking: Reported on 04/30/2019)   No facility-administered encounter medications on file as of 10/05/2019.   SDOH Screenings   Alcohol Screen:   . Last Alcohol Screening Score (AUDIT):   Depression (PHQ2-9): Low Risk   . PHQ-2 Score: 0  Financial Resource Strain: Low Risk   . Difficulty of Paying Living Expenses: Not hard at all  Food Insecurity:   . Worried About Charity fundraiser in the Last Year:   . [place] in the Last Year:   Housing:   . Last Housing Risk Score:   Physical Activity:   . Days of Exercise per Week:   . Minutes of Exercise per Session:   Social Connections:   . Frequency of Communication with Friends and Family:   . Frequency of Social Gatherings with Friends and Family:   . Attends Religious Services:   . Active Member of Clubs or Organizations:   . Attends Archivist Meetings:   Marland Kitchen Marital Status:   Stress:   . Feeling of Stress :   Tobacco Use: Medium Risk  . Smoking Tobacco  Use: Former Smoker  . Smokeless Tobacco Use: Never Used  Transportation Needs: No Transportation Needs  . Lack of Transportation (Medical): No  . Lack of Transportation (Non-Medical): No     Current Diagnosis/Assessment:    Goals Addressed            This Visit's Progress   . [place] (see longitudinal plan of care for additional care plan information)  Current Barriers:  . Chronic Disease Management support, education, and care coordination needs related to Asthma, Diabetes, Hypertension, Hyperlipidemia, Chronic Pain, Osteopenia     Hypertension BP Readings from Last 3 Encounters:  09/11/19 138/71  07/09/19 120/80  04/30/19 (!) 143/74   . Pharmacist Clinical Goal(s): o Over the next 90 days, patient will work with PharmD and providers to maintain BP goal <140/90 . Current regimen:  . Amlodipine 5 mg daily  . Hydrochlorothiazide 25 mg daily . Interventions: o Requested patient to record BP when checking daily . Patient self care activities - Over the next 90 days, patient will: o Check BP daily, document, and provide at future appointments o Ensure daily salt intake < 2300 mg/Isley Weisheit  Hyperlipidemia Lipid Panel     Component Value Date/Time   CHOL 328 (H) 07/09/2019 1007   TRIG 123.0 07/09/2019 1007   HDL 62.30 07/09/2019 1007   CHOLHDL 5 07/09/2019 1007   VLDL 24.6 07/09/2019 1007   LDLCALC 241 (H) 07/09/2019 1007   LDLDIRECT 230.7 03/18/2013 0805   . Pharmacist Clinical Goal(s): o Over the next 180 days, patient will work with PharmD and providers to achieve LDL goal <100, Total Cholesterol (CHOL) goal <200 or prevent continue upward trend . Current regimen:  o Pitavastatin 55m  daily . Interventions: o Consider taking pitavastatin every other Mckensi Redinger and see if patient can tolerate this dosing regimen o Discussed importance of LDL goal . Patient self care activities - Over the next 180 days, patient will: o Try to reduce cholesterol intake  Diabetes Lab Results  Component Value Date/Time   HGBA1C 6.8 (H) 07/09/2019 10:07 AM   HGBA1C 6.3 10/30/2018 09:53 AM   . Pharmacist Clinical Goal(s): o Over the next 90 days, patient will work with PharmD and providers to maintain A1c goal <7% . Current regimen:  o Metformin XR 500 mg daily  . Interventions: o Discussed the importance of limiting carbohydrates (30-45grams per meal) o Explore options for CGM . Patient self care activities - Over the next 90 days, patient will: o Check blood sugar 3-4 times per week document, and provide at future  appointments o Contact provider with any episodes of hypoglycemia  Asthma . Pharmacist Clinical Goal(s) o Over the next 90 days, patient will work with PharmD and providers to reduce symptoms of asthma . Current regimen:  o Albuterol HFA 108 mcg 2 puff q6hr PRN  o Flovent 110 mcg/act 2 puff BID  . Interventions: o Discussed the difference between Flovent and albuterol o Recommended patient to use Flovent daily to see if this decreases her shortness of breath with walking short distances . Patient self care activities - Over the next 90 days, patient will: o Start taking Flovent 2 puffs twice daily  Chronic Pain/Neuropathy . Pharmacist Clinical Goal(s) o Over the next 90 days, patient will work with PharmD and providers to reduce symptoms of pain/neuropathy . Current regimen:  . Lyrica 563mdaily . Meloxicam 15 mg daily PRN  . Methocarbamol 500  mg q8hr PRN  . Interventions: o Informed patient that Lyrica was replacing gabapentin . Patient self care activities - Over the next 90 days, patient will: o Maintain neuropathy medication regimen.  Medication management . Pharmacist Clinical Goal(s): o Over the next 90 days, patient will work with PharmD and providers to achieve optimal medication adherence . Current pharmacy: [facility] . Interventions o Comprehensive medication review performed. o Continue current medication management strategy . Patient self care activities - Over the next 90 days, patient will: o Focus on medication adherence by filling and taking medications appropriately  o Take medications as prescribed o Report any questions or concerns to PharmD and/or provider(s)  Please see past updates related to this goal by clicking on the "Past Updates" button in the selected goal       Social Hx:  Has 4 daughters. Has one that lives down the street. [place], [place]  Divorced and widowed. She has grand children, great grandchildren, and great grand  children Retired from 2005. 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 She then went to work at [employer] for 13 years and finally retired from there. Has been retired from [employer] x 2 years.  Movement/Exercise Patient comes into the office using cane. She states she overdid it in her garden yesterday.  States she uses a foot pedaling machine about 2-3 hours per Baylie Drakes while watching TV. She can't stand long due to back pain or walk far distances due to getting SOB.   Diet B - sausage biscuit from [employer]- Salad w/ [place] D - mostly eats out   Asthma    Last spirometry score: FEV1 84% (08/21/06)  Eosinophil count:   Lab Results  Component Value Date/Time   EOSPCT 5.9 (H) 12/16/2013 08:10 AM  %                               Eos (Absolute):  Lab Results  Component Value Date/Time   EOSABS 0.3 12/16/2013 08:10 AM    Tobacco Status:  Social History   Tobacco Use  Smoking Status Former Smoker  . Packs/Demarr Kluever: 0.50  . Years: 40.00  . Pack years: 20.00  . Types: Cigarettes  . Quit date: 05/25/1999  . Years since quitting: 20.3  Smokeless Tobacco Never Used    Patient has failed these meds in past: Qvar (cost) Patient is currently uncontrolled on the following medications:   Albuterol HFA 108 mcg 2 puff q6hr PRN   Flovent 110 mcg/act 2 puff BID   Using maintenance inhaler regularly? No Frequency of rescue inhaler use:  several times per month (about once a month)  Gets short of breath with walking and when humity increases. Gets short of breath, but not enough for her to feel like she needs to use albuterol.  Patient seems winded as we walk to the exam room. She admits that she can't walk far without getting winded.   We discussed:  How taking flovent daily could assist patient with potentially increasing her walking stamina without getting short of breath. She is open to starting Flovent daily. Discussed the difference between Flovent (steroid/reduce  inflammation) and albuterol ("lung opener" for when she gets winded)   Patient verbalized understanding and agreed to trying Flovent 2 puffs twice daily  Update 10/05/19 States her breathing has significantly improved since using flovent daily.  Plan -Start using Flovent 198mg 2 puffs twice daily -Continue current medications  Diabetes   A1c goal <7%  Recent Relevant Labs: Lab Results  Component Value Date/Time   HGBA1C 6.6 (H) 10/05/2019 11:13 AM   HGBA1C 6.8 (H) 07/09/2019 10:07 AM   GFR 81.37 10/05/2019 11:13 AM   GFR 67.74 07/09/2019 10:07 AM   MICROALBUR 1.0 10/05/2019 11:13 AM   MICROALBUR 1.9 07/09/2019 10:07 AM    Last diabetic Eye exam: No results found for: HMDIABEYEEXA  Last diabetic Foot exam: No results found for: HMDIABFOOTEX   Checking BG: Weekly (3-4 times per week)  Recent FBG Readings: "In range" 120s, lowest 98-282 after eating a honey bun Patient has failed these meds in past: None noted  Patient is currently controlled, but trending up on the following medications: Marland Kitchen Metformin XR 500 mg daily   States she was taking metformin twice daily, but the second dose caused her to be nauseous. She is tolerating the once daily dose ok.   We discussed: how her a1c is trending up and she is on the borderline of not being within goal. We discussed limiting carbohydrates.   Update 10/05/19 Patient is interested in getting Dexcom so prevent from having to poke her finger. Unsure if this will be covered under patient's insurance since she is not taking insulin. Will look into this.  Plan -Continue current medications  -Look into options for Dexcom or Freestyle Libre  Hypertension   BP goal is:  <140/90  Office blood pressures are  BP Readings from Last 3 Encounters:  10/05/19 140/78  09/11/19 138/71  07/09/19 120/80   CMP Latest Ref Rng & Units 10/05/2019 07/09/2019 10/30/2018  Glucose 70 - 99 mg/dL 106(H) 121(H) 107(H)  BUN 6 - 23 mg/dL 26(H) 23 24(H)   Creatinine 0.40 - 1.20 mg/dL 0.81 0.95 0.85  Sodium 135 - 145 mEq/L 138 138 137  Potassium 3.5 - 5.1 mEq/L 4.4 3.9 4.0  Chloride 96 - 112 mEq/L 104 102 102  CO2 19 - 32 mEq/L '24 26 25  ' Calcium 8.4 - 10.5 mg/dL 9.8 9.9 9.7  Total Protein 6.0 - 8.3 g/dL 7.9 7.9 8.1  Total Bilirubin 0.2 - 1.2 mg/dL 0.4 0.5 0.5  Alkaline Phos 39 - 117 U/L 63 58 71  AST 0 - 37 U/L '14 16 16  ' ALT 0 - 35 U/L '7 9 10   ' Patient checks BP at home daily Patient home BP readings are ranging: Reports highest are in the 140s   Patient has failed these meds in the past: None noted  Patient is currently controlled on the following medications:  . Amlodipine 5 mg daily  . HCTZ 25 mg daily   Neither medication listed in dispense report from last 6 months.   Almost out of BP meds  Update 10/05/19 States she only takes hctz when she has swelling.  She doesn't like having to urinate frequently. Feels it places a lot of pressure on her bladder.  Blood Pressure Readings: '136 68 141 75 128 75 127 65 131 75 ' 125 63 130 75 115 77 Avg 129.1 71.6  We discussed importance of verifying which medications she is taking and medication adherence  Plan -Continue current medications  -Record BP when checking daily    Hyperlipidemia   LDL goal <100  Lipid Panel     Component Value Date/Time   CHOL 291 (H) 10/05/2019 1113   TRIG 104.0 10/05/2019 1113   HDL 57.00 10/05/2019 1113   LDLCALC 213 (H) 10/05/2019 1113   LDLDIRECT 230.7 03/18/2013 0805  Hepatic Function Latest Ref Rng & Units 10/05/2019 07/09/2019 10/30/2018  Total Protein 6.0 - 8.3 g/dL 7.9 7.9 8.1  Albumin 3.5 - 5.2 g/dL 4.2 4.2 4.4  AST 0 - 37 U/L '14 16 16  ' ALT 0 - 35 U/L '7 9 10  ' Alk Phosphatase 39 - 117 U/L 63 58 71  Total Bilirubin 0.2 - 1.2 mg/dL 0.4 0.5 0.5  Bilirubin, Direct 0.0 - 0.3 mg/dL - - -     The ASCVD Risk score ([place]., et al., 2013) failed to calculate for the following reasons:   The 2013 ASCVD risk score is only valid  for ages 72 to 75   Patient has failed these meds in past: atorvastatin, ezetimibe, fenofibrate, pravastatin, rosuvastatin, simvastatin Patient is currently uncontrolled on the following medications:  . Pitavastatin 4 mg daily (not taking)  States she is not taking anything for her cholesterol because of muscle aches; Cramps in her legs  States her leg cramping will do well for a while then start back while taking cholesterol medication.  We discussed:  importance of LDL goal noting patient's resistance to cholesterol medications   Update 10/05/19 States she has not taken pitavastatin in a week. Discussed patient taking every other Gizel Riedlinger and she is agreeable to this.  She states she would not be able to afford injectable cholesterol medication. This was attempted in the past and would cost the patient thousands of dollars, so she seems hesitant to follow with lipid clinic.  Plan -Consider taking pitavastatin every other Jimi Giza vs daily to see if this reduces cramping  Chronic Pain/Peripheral Neuropathy   Peripheral neuropathy Degenerative joint disease  Patient has failed these meds in past: None noted  Patient is currently controlled on the following medications:  . Lyrica 58m daily . Meloxicam 15 mg daily PRN  . Methocarbamol 500 mg q8hr PRN   Meloxicam and methocarb stopped when cholesterol med stopped  Update 10/05/19 Clarified that lyrica is replacing gabapentin for nerve pain.  She was having a rash with gabapentin.   Plan -Continue current medications  Osteopenia    Last DEXA Scan: 05/08/18   T-Score femoral neck: -1.5  T-Score total hip: -1.5  T-Score lumbar spine: -1.3  T-Score forearm radius: -1.0  10-year probability of major osteoporotic fracture: 5.3%  10-year probability of hip fracture: 1.3%  Vit D, 25-Hydroxy  Date Value Ref Range Status  09/29/2009 39 30 - 89 ng/mL Final    Comment:    See lab report for associated comment(s)     Patient is not a  candidate for pharmacologic treatment  Patient has failed these meds in past: None noted  Patient is currently uncontrolled on the following medications: None  We discussed:  Recommend [id number] units of vitamin D daily. Recommend 1200 mg of calcium daily from dietary and supplemental sources.   Update 10/05/19 Reminded patient to get proper supplementation of calcium and vitamin D.   Plan -Consider intake of 12022mof calcium daily through diet and/or supplementation -Consider intake of [id number] units of vitamin D through supplementation

## 2019-10-05 NOTE — Progress Notes (Signed)
Patient ID: Felicia Martin, female    DOB: 05/24/1932  Age: 86 y.o. MRN: [id number]    Subjective:  Subjective  HPI Felicia Martin presents for f/u dm , chol and bp  HYPERTENSION   Blood pressure range-good at home   Chest pain- no      Dyspnea- no Lightheadedness- no   Edema- yes   Other side effects - no   Medication compliance: good  Low salt diet- yes     DIABETES    Blood Sugar ranges-130-140  Polyuria- no New Visual problems- no  Hypoglycemic symptoms- no  Other side effects-no Medication compliance - good Last eye exam- 1 month Foot exam- today   HYPERLIPIDEMIA  Medication compliance- good RUQ pain- no  Muscle aches- no Other side effects-no      Review of Systems  Constitutional: Negative for appetite change, diaphoresis, fatigue and unexpected weight change.  Eyes: Negative for pain, redness and visual disturbance.  Respiratory: Negative for cough, chest tightness, shortness of breath and wheezing.   Cardiovascular: Negative for chest pain, palpitations and leg swelling.  Endocrine: Negative for cold intolerance, heat intolerance, polydipsia, polyphagia and polyuria.  Genitourinary: Negative for difficulty urinating, dysuria and frequency.  Neurological: Negative for dizziness, light-headedness, numbness and headaches.    History Past Medical History:  Diagnosis Date  . Asthma   . GERD (gastroesophageal reflux disease)   . Hypercholesterolemia   . Hypertension   . Pulmonary fibrosis ([place])   . Sarcoidosis   . Surgical history of tubal ligation 1960s    She has a past surgical history that includes Lung surgery (1960); Tubal ligation (1960); Maloney dilation (12/09/14); Colonoscopy (12/09/14); and Esophagogastroduodenoscopy (12/09/14).   Her family history includes Alzheimer's disease in her sister; Cancer in her brother and father; Diabetes in her brother and sister; Hyperlipidemia in an other family member; Hypertension in her sister; Liver  cancer in her father and mother.She reports that she quit smoking about 20 years ago. Her smoking use included cigarettes. She has a 20.00 pack-year smoking history. She has never used smokeless tobacco. She reports that she does not drink alcohol and does not use drugs.  Current Outpatient Medications on File Prior to Visit  Medication Sig Dispense Refill  . albuterol (VENTOLIN HFA) 108 (90 Base) MCG/ACT inhaler Inhale 2 puffs into the lungs every 6 (six) hours as needed. 18 g 2  . amLODipine (NORVASC) 5 MG tablet Take 1 tablet (5 mg total) by mouth daily. 90 tablet 0  . Blood Glucose Monitoring Suppl (ONETOUCH VERIO) w/Device KIT Use as directed once a day.  Dx code: E11.9 1 kit 0  . glucose blood (ONETOUCH VERIO) test strip Use as instructed once a day.  Dx Code: E11.9 100 each 1  . hydrochlorothiazide (HYDRODIURIL) 25 MG tablet Take 1 tablet (25 mg total) by mouth daily. 90 tablet 3  . Lancets (ONETOUCH DELICA PLUS [id number]) MISC 1 each by Does not apply route daily. DX CODE: E11.9 100 each 1  . metFORMIN (GLUCOPHAGE XR) 500 MG 24 hr tablet Take 1 tablet (500 mg total) by mouth daily with breakfast. NEEDS OV/FOLLOW UP 90 tablet 0  . ofloxacin (FLOXIN OTIC) 0.3 % OTIC solution Place 10 drops into both ears daily. 10 mL 1   No current facility-administered medications on file prior to visit.     Objective:  Objective  Physical Exam Vitals and nursing note reviewed.  Constitutional:      Appearance: She is well-developed.  HENT:  Head: Normocephalic and atraumatic.  Eyes:     Conjunctiva/sclera: Conjunctivae normal.  Neck:     Thyroid: No thyromegaly.     Vascular: No carotid bruit or JVD.  Cardiovascular:     Rate and Rhythm: Normal rate and regular rhythm.     Heart sounds: Normal heart sounds. No murmur heard.   Pulmonary:     Effort: Pulmonary effort is normal. No respiratory distress.     Breath sounds: Normal breath sounds. No wheezing or rales.  Chest:     Chest wall:  No tenderness.  Musculoskeletal:     Cervical back: Normal range of motion and neck supple.  Neurological:     Mental Status: She is alert and oriented to person, place, and time.    Diabetic Foot Exam - Simple   Simple Foot Form Diabetic Foot exam was performed with the following findings: Yes 10/05/2019 10:53 AM  Visual Inspection No deformities, no ulcerations, no other skin breakdown bilaterally: Yes Sensation Testing Intact to touch and monofilament testing bilaterally: Yes Pulse Check Posterior Tibialis and Dorsalis pulse intact bilaterally: Yes Comments     BP 140/78 (BP Location: Left Arm, Patient Position: Sitting, Cuff Size: Large)   Pulse 71   Temp 98.2 F (36.8 C) (Temporal)   Resp 18   Ht 5' (1.524 m)   Wt 211 lb 3.2 oz (95.8 kg)   LMP 03/17/2014   SpO2 99%   BMI 41.25 kg/m  Wt Readings from Last 3 Encounters:  10/05/19 211 lb 3.2 oz (95.8 kg)  09/11/19 214 lb 9.6 oz (97.3 kg)  07/09/19 210 lb 6.4 oz (95.4 kg)     Lab Results  Component Value Date   WBC 4.4 12/16/2013   HGB 12.2 12/16/2013   HCT 36.7 12/16/2013   PLT 279.0 12/16/2013   GLUCOSE 106 (H) 10/05/2019   CHOL 291 (H) 10/05/2019   TRIG 104.0 10/05/2019   HDL 57.00 10/05/2019   LDLDIRECT 230.7 03/18/2013   LDLCALC 213 (H) 10/05/2019   ALT 7 10/05/2019   AST 14 10/05/2019   NA 138 10/05/2019   K 4.4 10/05/2019   CL 104 10/05/2019   CREATININE 0.81 10/05/2019   BUN 26 (H) 10/05/2019   CO2 24 10/05/2019   TSH 2.19 09/29/2009   HGBA1C 6.6 (H) 10/05/2019   MICROALBUR 1.0 10/05/2019    DG Bone Density  Result Date: 05/08/2018 EXAM: DUAL X-RAY ABSORPTIOMETRY (DXA) FOR BONE MINERAL DENSITY IMPRESSION: Felicia Martin Felicia Martin completed a BMD test on 05/08/2018 using the [place] (analysis version: 16.SP2) manufactured by [employer]. The following summarizes the results of our evaluation. PATIENT: Name: Felicia Martin Patient ID: [id number] Birth  Date: [birth year] Height: 60.0 in. Gender: Female Measured: 05/08/2018 Weight: 212.8 lbs. Indications: Advanced Age, African-American, Estrogen Deficiency, Post Menopausal, Previous Tobacco User Fractures: Treatments: ASSESSMENT: The BMD measured at Femur Neck Left is 0.832 g/cm2 with a T-score of -1.5. This patient is considered osteopenic according to [facility]) criteria. Scan quality was good. Site Region Measured Date Measured Age WHO YA BMD Classification T-score AP Spine L1-L4 05/08/2018 [age] Osteopenia -1.3 1.021 g/cm2 DualFemur Neck Left 05/08/2018 [age] Osteopenia -1.5 0.832 g/cm2 Left Forearm Radius 33% 05/08/2018 83.2 Normal -1.0 0.788 g/cm2 World Health Organization [facility]) criteria for post-menopausal, Caucasian Women: Normal       T-score at or above -1 SD Osteopenia   T-score between -1 and -2.5 SD Osteoporosis T-score at or below -2.5  SD RECOMMENDATION: 1. All patients should optimize calcium and vitamin D intake. 2. Consider FDA-approved medical therapies in postmenopausal women and men aged 18 years and older, based on the following: a. A hip or vertebral(clinical or morphometric) fracture. b. T-Score < -2.5 at the femoral neck or spine after appropriate evaluation to exclude secondary causes c. Low bone mass (T-score between -1.0 and -2.5 at the femoral neck or spine) and a 10 year probability of a hip fracture >3% or a 10 year probability of major osteoporosis-related fracture > 20% based on the [place]-adapted WHO algorithm d. Clinical judgement and/or patient preferences may indicate treatment for people with 10-year fracture probabilities above or below these levels FOLLOW-UP: Patients with diagnosis of osteoporosis or at high risk for fracture should have regular bone mineral density tests. For patients eligible for Medicare, routine testing is allowed once every 2 years. The testing frequency can be increased to one year for patients who have rapidly progressing disease,  those who are receiving or discontinuing medical therapy to restore bone mass, or have additional risk factors. I have reviewed this report and agree with the above findings. [facility] Patient: [name] Referring Physician: Rosalita Chessman Felicia Birth Date: [birth year] [age] Patient ID: [id number] Height: 60.0 in. Weight: 212.8 lbs. Measured: 05/08/2018 10:37:42 AM (16 SP 2) Gender: Female Ethnicity: Black Analyzed: 05/08/2018 10:47:34 AM (16 SP 2) FRAX* 10-year Probability of Fracture Based on femoral neck BMD: DualFemur (Left) Major Osteoporotic Fracture: 5.3% Hip Fracture:                1.3% Population:                  [place] (Black) Risk Factors:                None *FRAX is a Materials engineer of the [employer] for Metabolic Bone Disease, a [place] (WHO) [facility]. ASSESSMENT: The probability of a major osteoporotic fracture is 5.3% within the next ten years. The probability of a hip fracture is 1.3% within the next ten years. Electronically Signed   By: Lowella Grip III M.D.   On: 05/08/2018 10:23     Assessment & Plan:  Plan  I have discontinued Daphanie T. Dietzman's fluticasone, methocarbamol, Pitavastatin Calcium, and gabapentin. I am also having her start on pregabalin. Additionally, I am having her maintain her OneTouch Verio, albuterol, hydrochlorothiazide, ofloxacin, OneTouch Delica Plus [id number], OneTouch Verio, amLODipine, metFORMIN, and meloxicam.  Meds ordered this encounter  Medications  . meloxicam (MOBIC) 15 MG tablet    Sig: TAKE 1 TABLET BY MOUTH EVERY DAY AS NEEDED FOR PAIN    Dispense:  30 tablet    Refill:  2  . pregabalin (LYRICA) 50 MG capsule    Sig: Take 1 capsule (50 mg total) by mouth 2 (two) times daily.    Dispense:  60 capsule    Refill:  1    Problem List Items Addressed This Visit      Unprioritized   DDD (degenerative disc disease), thoracolumbar    Refill  mobic  Stable       Relevant Medications   meloxicam (MOBIC) 15 MG tablet   Essential hypertension    Well controlled, no changes to meds. Encouraged heart healthy diet such as the DASH diet and exercise as tolerated.       Hyperlipidemia associated with type 2 diabetes mellitus ([place])  Tolerating statin, encouraged heart healthy diet, avoid trans fats, minimize simple carbs and saturated fats. Increase exercise as tolerated      Relevant Orders   Lipid panel (Completed)   Microalbumin / creatinine urine ratio (Completed)   Uncontrolled type 2 diabetes mellitus with hyperglycemia ([place]) - Primary    hgba1c to be checked, minimize simple carbs. Increase exercise as tolerated. Continue current meds       Relevant Orders   Hemoglobin A1c (Completed)   Comprehensive metabolic panel (Completed)   Microalbumin / creatinine urine ratio (Completed)    Other Visit Diagnoses    Type 2 diabetes mellitus with diabetic neuropathy, without long-term current use of insulin (HCC)       Relevant Medications   pregabalin (LYRICA) 50 MG capsule      Follow-up: Return in about 6 months (around 04/06/2020), or if symptoms worsen or fail to improve, for hypertension, hyperlipidemia, diabetes II, annual exam, fasting.  Ann Held, DO

## 2019-10-06 DIAGNOSIS — E1169 Type 2 diabetes mellitus with other specified complication: Secondary | ICD-10-CM | POA: Insufficient documentation

## 2019-10-06 DIAGNOSIS — E1165 Type 2 diabetes mellitus with hyperglycemia: Secondary | ICD-10-CM | POA: Insufficient documentation

## 2019-10-06 DIAGNOSIS — M5135 Other intervertebral disc degeneration, thoracolumbar region: Secondary | ICD-10-CM | POA: Insufficient documentation

## 2019-10-06 NOTE — Assessment & Plan Note (Signed)
Refill mobic  Stable

## 2019-10-06 NOTE — Assessment & Plan Note (Signed)
Well controlled, no changes to meds. Encouraged heart healthy diet such as the DASH diet and exercise as tolerated.  °

## 2019-10-06 NOTE — Assessment & Plan Note (Addendum)
Encouraged heart healthy diet, increase exercise, avoid trans fats, consider a krill oil cap daily Pt is statin intolerant  She is unable to tolerate zetia or fenofibrate as well  Consider lipid clinic Check labs

## 2019-10-06 NOTE — Assessment & Plan Note (Signed)
hgba1c to be checked, minimize simple carbs. Increase exercise as tolerated. Continue current meds  

## 2019-10-07 ENCOUNTER — Telehealth: Payer: Self-pay | Admitting: Family Medicine

## 2019-10-07 NOTE — Telephone Encounter (Signed)
Returned call. Left VM to call back

## 2019-10-07 NOTE — Telephone Encounter (Signed)
Patient calling in regards to lab results.

## 2019-10-08 ENCOUNTER — Ambulatory Visit: Payer: Medicare Other

## 2019-10-10 NOTE — Patient Instructions (Signed)
Visit Information  Goals Addressed            This Visit's Progress   . [place] (see longitudinal plan of care for additional care plan information)  Current Barriers:  . Chronic Disease Management support, education, and care coordination needs related to Asthma, Diabetes, Hypertension, Hyperlipidemia, Chronic Pain, Osteopenia   Hypertension BP Readings from Last 3 Encounters:  09/11/19 138/71  07/09/19 120/80  04/30/19 (!) 143/74   . Pharmacist Clinical Goal(s): o Over the next 90 days, patient will work with PharmD and providers to maintain BP goal <140/90 . Current regimen:  . Amlodipine 5 mg daily  . Hydrochlorothiazide 25 mg daily . Interventions: o Requested patient to record BP when checking daily . Patient self care activities - Over the next 90 days, patient will: o Check BP daily, document, and provide at future appointments o Ensure daily salt intake < 2300 mg/Fany Cavanaugh  Hyperlipidemia Lipid Panel     Component Value Date/Time   CHOL 328 (H) 07/09/2019 1007   TRIG 123.0 07/09/2019 1007   HDL 62.30 07/09/2019 1007   CHOLHDL 5 07/09/2019 1007   VLDL 24.6 07/09/2019 1007   LDLCALC 241 (H) 07/09/2019 1007   LDLDIRECT 230.7 03/18/2013 0805   . Pharmacist Clinical Goal(s): o Over the next 180 days, patient will work with PharmD and providers to achieve LDL goal <100, Total Cholesterol (CHOL) goal <200 or prevent continue upward trend . Current regimen:  o Pitavastatin 4mg   daily . Interventions: o Consider taking pitavastatin every other Shalice Woodring and see if patient can tolerate this dosing regimen o Discussed importance of LDL goal . Patient self care activities - Over the next 180 days, patient will: o Try to reduce cholesterol intake  Diabetes Lab Results  Component Value Date/Time   HGBA1C 6.8 (H) 07/09/2019 10:07 AM   HGBA1C 6.3 10/30/2018 09:53 AM   . Pharmacist Clinical Goal(s): o Over the next 90  days, patient will work with PharmD and providers to maintain A1c goal <7% . Current regimen:  o Metformin XR 500 mg daily  . Interventions: o Discussed the importance of limiting carbohydrates (30-45grams per meal) o Explore options for CGM . Patient self care activities - Over the next 90 days, patient will: o Check blood sugar 3-4 times per week document, and provide at future appointments o Contact provider with any episodes of hypoglycemia  Asthma . Pharmacist Clinical Goal(s) o Over the next 90 days, patient will work with PharmD and providers to reduce symptoms of asthma . Current regimen:  o Albuterol HFA 108 mcg 2 puff q6hr PRN  o Flovent 110 mcg/act 2 puff BID  . Interventions: o Discussed the difference between Flovent and albuterol o Recommended patient to use Flovent daily to see if this decreases her shortness of breath with walking short distances . Patient self care activities - Over the next 90 days, patient will: o Start taking Flovent 2 puffs twice daily  Chronic Pain/Neuropathy . Pharmacist Clinical Goal(s) o Over the next 90 days, patient will work with PharmD and providers to reduce symptoms of pain/neuropathy . Current regimen:  . Lyrica 50mg  daily . Meloxicam 15 mg daily PRN  . Methocarbamol 500 mg q8hr PRN  . Interventions: o Informed patient that Lyrica was replacing gabapentin . Patient self care activities - Over the next 90 days, patient will: o Maintain neuropathy medication regimen.  Medication management . Pharmacist Clinical  Goal(s): o Over the next 90 days, patient will work with PharmD and providers to achieve optimal medication adherence . Current pharmacy: [facility] . Interventions o Comprehensive medication review performed. o Continue current medication management strategy . Patient self care activities - Over the next 90 days, patient will: o Focus on medication adherence by filling and taking medications appropriately  o Take  medications as prescribed o Report any questions or concerns to PharmD and/or provider(s)  Please see past updates related to this goal by clicking on the "Past Updates" button in the selected goal         The patient verbalized understanding of instructions provided today and agreed to receive a mailed copy of patient instruction and/or educational materials.   Face to Face appointment with pharmacist scheduled for:  10/21/19  De Blanch, PharmD Clinical Pharmacist [facility] [phone]

## 2019-10-21 ENCOUNTER — Ambulatory Visit: Payer: Medicare Other | Admitting: Pharmacist

## 2019-10-21 ENCOUNTER — Other Ambulatory Visit: Payer: Self-pay

## 2019-10-21 DIAGNOSIS — E1165 Type 2 diabetes mellitus with hyperglycemia: Secondary | ICD-10-CM

## 2019-10-21 DIAGNOSIS — E785 Hyperlipidemia, unspecified: Secondary | ICD-10-CM

## 2019-10-21 NOTE — Chronic Care Management (AMB) (Signed)
Chronic Care Management Pharmacy  Name: Felicia Martin  MRN: [id number] DOB: Jan 17, 1933   Chief Complaint/ HPI  Felicia Martin,  86 y.o. , female presents for their Follow-Up CCM visit with the clinical pharmacist In office   PCP : Ann Held, DO  Their chronic conditions include: Asthma, Diabetes, Hypertension, Hyperlipidemia, Chronic Pain, Osteopenia  Office Visits: 10/05/19: Visit w/ Dr. Etter Sjogren - Consider lipid clinic. Pt unable to tolerate zetia or fenofibrate   Consult Visit: None since last CCM visit on 10/05/19.  Allergies  Allergen Reactions  . Statins Other (See Comments)    Muscle aches and weakness  . Zetia [Ezetimibe] Other (See Comments)    Muscle pain  . Fenofibrate Rash    Medications: Outpatient Encounter Medications as of 10/21/2019  Medication Sig  . albuterol (VENTOLIN HFA) 108 (90 Base) MCG/ACT inhaler Inhale 2 puffs into the lungs every 6 (six) hours as needed.  Marland Kitchen amLODipine (NORVASC) 5 MG tablet Take 1 tablet (5 mg total) by mouth daily.  . Blood Glucose Monitoring Suppl (ONETOUCH VERIO) w/Device KIT Use as directed once a Alyxandria Wentz.  Dx code: E11.9  . glucose blood (ONETOUCH VERIO) test strip Use as instructed once a Derril Franek.  Dx Code: E11.9  . hydrochlorothiazide (HYDRODIURIL) 25 MG tablet Take 1 tablet (25 mg total) by mouth daily.  . Lancets (ONETOUCH DELICA PLUS [id number]) MISC 1 each by Does not apply route daily. DX CODE: E11.9  . meloxicam (MOBIC) 15 MG tablet TAKE 1 TABLET BY MOUTH EVERY Raliyah Montella AS NEEDED FOR PAIN  . metFORMIN (GLUCOPHAGE XR) 500 MG 24 hr tablet Take 1 tablet (500 mg total) by mouth daily with breakfast. NEEDS OV/FOLLOW UP  . ofloxacin (FLOXIN OTIC) 0.3 % OTIC solution Place 10 drops into both ears daily.  . pregabalin (LYRICA) 50 MG capsule Take 1 capsule (50 mg total) by mouth 2 (two) times daily.   No facility-administered encounter medications on file as of 10/21/2019.   SDOH Screenings   Alcohol Screen:   . Last  Alcohol Screening Score (AUDIT):   Depression (PHQ2-9): Low Risk   . PHQ-2 Score: 0  Financial Resource Strain: Low Risk   . Difficulty of Paying Living Expenses: Not hard at all  Food Insecurity:   . Worried About Charity fundraiser in the Last Year:   . [place] in the Last Year:   Housing:   . Last Housing Risk Score:   Physical Activity:   . Days of Exercise per Week:   . Minutes of Exercise per Session:   Social Connections:   . Frequency of Communication with Friends and Family:   . Frequency of Social Gatherings with Friends and Family:   . Attends Religious Services:   . Active Member of Clubs or Organizations:   . Attends Archivist Meetings:   Marland Kitchen Marital Status:   Stress:   . Feeling of Stress :   Tobacco Use: Medium Risk  . Smoking Tobacco Use: Former Smoker  . Smokeless Tobacco Use: Never Used  Transportation Needs: No Transportation Needs  . Lack of Transportation (Medical): No  . Lack of Transportation (Non-Medical): No     Current Diagnosis/Assessment:    Goals Addressed            This Visit's Progress   . Chronic Care Mangement Pharmacy Care Plan       CARE PLAN ENTRY (see longitudinal plan of care for additional care plan information)  Current  Barriers:  . Chronic Disease Management support, education, and care coordination needs related to Asthma, Diabetes, Hypertension, Hyperlipidemia, Chronic Pain, Osteopenia   Hypertension BP Readings from Last 3 Encounters:  09/11/19 138/71  07/09/19 120/80  04/30/19 (!) 143/74   . Pharmacist Clinical Goal(s): o Over the next 90 days, patient will work with PharmD and providers to maintain BP goal <140/90 . Current regimen:  . Amlodipine 5 mg daily  . Hydrochlorothiazide 25 mg daily . Interventions: o Requested patient to record BP when checking daily . Patient self care activities - Over the next 90 days, patient will: o Check BP daily, document, and provide at future  appointments o Ensure daily salt intake < 2300 mg/Letticia Bhattacharyya  Hyperlipidemia Lipid Panel     Component Value Date/Time   CHOL 328 (H) 07/09/2019 1007   TRIG 123.0 07/09/2019 1007   HDL 62.30 07/09/2019 1007   CHOLHDL 5 07/09/2019 1007   VLDL 24.6 07/09/2019 1007   LDLCALC 241 (H) 07/09/2019 1007   LDLDIRECT 230.7 03/18/2013 0805   . Pharmacist Clinical Goal(s): o Over the next 180 days, patient will work with PharmD and providers to achieve LDL goal <100, Total Cholesterol (CHOL) goal <200 or prevent continue upward trend . Current regimen:  o Pitavastatin 10m  daily . Interventions: o Consider taking pitavastatin every other Fannye Myer and see if patient can tolerate this dosing regimen o Discussed importance of LDL goal . Patient self care activities - Over the next 180 days, patient will: o Try to reduce cholesterol intake  Diabetes Lab Results  Component Value Date/Time   HGBA1C 6.8 (H) 07/09/2019 10:07 AM   HGBA1C 6.3 10/30/2018 09:53 AM   . Pharmacist Clinical Goal(s): o Over the next 90 days, patient will work with PharmD and providers to maintain A1c goal <7% . Current regimen:  o Metformin XR 500 mg daily  . Interventions: o Discussed the importance of limiting carbohydrates (30-45grams per meal) o Explore options for CGM o Placed Dexcom G6 sample on patient . Patient self care activities - Over the next 90 days, patient will: o Check blood sugar 3-4 times per week document, and provide at future appointments o Contact provider with any episodes of hypoglycemia  Asthma . Pharmacist Clinical Goal(s) o Over the next 90 days, patient will work with PharmD and providers to reduce symptoms of asthma . Current regimen:  o Albuterol HFA 108 mcg 2 puff q6hr PRN  o Flovent 110 mcg/act 2 puff BID  . Interventions: o Discussed the difference between Flovent and albuterol o Recommended patient to use Flovent daily to see if this decreases her shortness of breath with walking short  distances . Patient self care activities - Over the next 90 days, patient will: o Start taking Flovent 2 puffs twice daily  Chronic Pain/Neuropathy . Pharmacist Clinical Goal(s) o Over the next 90 days, patient will work with PharmD and providers to reduce symptoms of pain/neuropathy . Current regimen:  . Lyrica 555mdaily . Meloxicam 15 mg daily PRN  . Methocarbamol 500 mg q8hr PRN  . Interventions: o Informed patient that Lyrica was replacing gabapentin . Patient self care activities - Over the next 90 days, patient will: o Maintain neuropathy medication regimen.  Medication management . Pharmacist Clinical Goal(s): o Over the next 90 days, patient will work with PharmD and providers to achieve optimal medication adherence . Current pharmacy: [facility] . Interventions o Comprehensive medication review performed. o Continue current medication management strategy . Patient self care activities -  Over the next 90 days, patient will: o Focus on medication adherence by filling and taking medications appropriately  o Take medications as prescribed o Report any questions or concerns to PharmD and/or provider(s)  Please see past updates related to this goal by clicking on the "Past Updates" button in the selected goal       Social Hx:  Has 4 daughters. Has one that lives down the street. [place], [place], [place]  Divorced and widowed. She has grand children, great grandchildren, and great grand children Retired from 2005. 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 She then went to work at [employer] for 13 years and finally retired from there. Has been retired from [employer] x 2 years.  Movement/Exercise Patient comes into the office using cane. She states she overdid it in her garden yesterday.  States she uses a foot pedaling machine about 2-3 hours per Elysa Womac while watching TV. She can't stand long due to back pain or walk far distances due to getting SOB.    Diet B - sausage biscuit from [employer] L- Salad w/ [place] D - mostly eats out  Diabetes   A1c goal <7%  Recent Relevant Labs: Lab Results  Component Value Date/Time   HGBA1C 6.6 (H) 10/05/2019 11:13 AM   HGBA1C 6.8 (H) 07/09/2019 10:07 AM   GFR 81.37 10/05/2019 11:13 AM   GFR 67.74 07/09/2019 10:07 AM   MICROALBUR 1.0 10/05/2019 11:13 AM   MICROALBUR 1.9 07/09/2019 10:07 AM    Last diabetic Eye exam: No results found for: HMDIABEYEEXA  Last diabetic Foot exam: No results found for: HMDIABFOOTEX   Checking BG: Weekly (3-4 times per week)  Recent FBG Readings: "In range" 120s, lowest 98-282 after eating a honey bun Patient has failed these meds in past: None noted  Patient is currently controlled, but trending up on the following medications: Marland Kitchen Metformin XR 500 mg daily   States she was taking metformin twice daily, but the second dose caused her to be nauseous. She is tolerating the once daily dose ok.   We discussed: how her a1c is trending up and she is on the borderline of not being within goal. We discussed limiting carbohydrates.   Update 10/05/19 Patient is interested in getting Dexcom so prevent from having to poke her finger. Unsure if this will be covered under patient's insurance since she is not taking insulin. Will look into this.  Update 10/21/19 Significant time spent getting patient's phone set up to use with Dexcom sample placement. Patient is unaware of her AppleID password which prevents her from being able to download the Dexcom G6 app needed to read blood sugars. Placed Dexcom on patient's left side abdomen. She tolerated the placement well. She states her daughter will help her get her Apple ID set up and/or call me if she can not get it figured out. Also provided patient with customer support number for Dexcom.   Plan -Continue current medications  -Set up Dexcom G6 app to complete Dexcom sample reading -Pharmacy team to followup next week    Hyperlipidemia   LDL goal <100  Lipid Panel     Component Value Date/Time   CHOL 291 (H) 10/05/2019 1113   TRIG 104.0 10/05/2019 1113   HDL 57.00 10/05/2019 1113   LDLCALC 213 (H) 10/05/2019 1113   LDLDIRECT 230.7 03/18/2013 0805    Hepatic Function Latest Ref Rng & Units 10/05/2019 07/09/2019 10/30/2018  Total Protein 6.0 - 8.3 g/dL 7.9 7.9 8.1  Albumin 3.5 - 5.2 g/dL 4.2 4.2 4.4  AST 0 - 37 U/L '14 16 16  ' ALT 0 - 35 U/L '7 9 10  ' Alk Phosphatase 39 - 117 U/L 63 58 71  Total Bilirubin 0.2 - 1.2 mg/dL 0.4 0.5 0.5  Bilirubin, Direct 0.0 - 0.3 mg/dL - - -     The ASCVD Risk score Mikey Bussing DC Jr., et al., 2013) failed to calculate for the following reasons:   The 2013 ASCVD risk score is only valid for ages 63 to 41   Patient has failed these meds in past: atorvastatin, ezetimibe, fenofibrate, pravastatin, rosuvastatin, simvastatin Patient is currently uncontrolled on the following medications:  . Pitavastatin 4 mg daily (not taking)  States she is not taking anything for her cholesterol because of muscle aches; Cramps in her legs  States her leg cramping will do well for a while then start back while taking cholesterol medication.  We discussed:  importance of LDL goal noting patient's resistance to cholesterol medications   Update 10/05/19 States she has not taken pitavastatin in a week. Discussed patient taking every other Afsana Liera and she is agreeable to this.  She states she would not be able to afford injectable cholesterol medication. This was attempted in the past and would cost the patient thousands of dollars, so she seems hesitant to follow with lipid clinic.  Update 10/21/19 Will have pharmacy team to follow up on this next week. Significant time spent placing Dexcom.  Plan -Consider taking pitavastatin every other Soundra Lampley vs daily to see if this reduces cramping

## 2019-10-24 NOTE — Patient Instructions (Signed)
Visit Information  Goals Addressed            This Visit's Progress   . [place] (see longitudinal plan of care for additional care plan information)  Current Barriers:  . Chronic Disease Management support, education, and care coordination needs related to Asthma, Diabetes, Hypertension, Hyperlipidemia, Chronic Pain, Osteopenia   Hypertension BP Readings from Last 3 Encounters:  09/11/19 138/71  07/09/19 120/80  04/30/19 (!) 143/74   . Pharmacist Clinical Goal(s): o Over the next 90 days, patient will work with PharmD and providers to maintain BP goal <140/90 . Current regimen:  . Amlodipine 5 mg daily  . Hydrochlorothiazide 25 mg daily . Interventions: o Requested patient to record BP when checking daily . Patient self care activities - Over the next 90 days, patient will: o Check BP daily, document, and provide at future appointments o Ensure daily salt intake < 2300 mg/Felicia Martin  Hyperlipidemia Lipid Panel     Component Value Date/Time   CHOL 328 (H) 07/09/2019 1007   TRIG 123.0 07/09/2019 1007   HDL 62.30 07/09/2019 1007   CHOLHDL 5 07/09/2019 1007   VLDL 24.6 07/09/2019 1007   LDLCALC 241 (H) 07/09/2019 1007   LDLDIRECT 230.7 03/18/2013 0805   . Pharmacist Clinical Goal(s): o Over the next 180 days, patient will work with PharmD and providers to achieve LDL goal <100, Total Cholesterol (CHOL) goal <200 or prevent continue upward trend . Current regimen:  o Pitavastatin 4mg   daily . Interventions: o Consider taking pitavastatin every other Felicia Martin and see if patient can tolerate this dosing regimen o Discussed importance of LDL goal . Patient self care activities - Over the next 180 days, patient will: o Try to reduce cholesterol intake  Diabetes Lab Results  Component Value Date/Time   HGBA1C 6.8 (H) 07/09/2019 10:07 AM   HGBA1C 6.3 10/30/2018 09:53 AM   . Pharmacist Clinical Goal(s): o Over the next 90  days, patient will work with PharmD and providers to maintain A1c goal <7% . Current regimen:  o Metformin XR 500 mg daily  . Interventions: o Discussed the importance of limiting carbohydrates (30-45grams per meal) o Explore options for CGM o Placed Dexcom G6 sample on patient . Patient self care activities - Over the next 90 days, patient will: o Check blood sugar 3-4 times per week document, and provide at future appointments o Contact provider with any episodes of hypoglycemia  Asthma . Pharmacist Clinical Goal(s) o Over the next 90 days, patient will work with PharmD and providers to reduce symptoms of asthma . Current regimen:  o Albuterol HFA 108 mcg 2 puff q6hr PRN  o Flovent 110 mcg/act 2 puff BID  . Interventions: o Discussed the difference between Flovent and albuterol o Recommended patient to use Flovent daily to see if this decreases her shortness of breath with walking short distances . Patient self care activities - Over the next 90 days, patient will: o Start taking Flovent 2 puffs twice daily  Chronic Pain/Neuropathy . Pharmacist Clinical Goal(s) o Over the next 90 days, patient will work with PharmD and providers to reduce symptoms of pain/neuropathy . Current regimen:  . Lyrica 50mg  daily . Meloxicam 15 mg daily PRN  . Methocarbamol 500 mg q8hr PRN  . Interventions: o Informed patient that Lyrica was replacing gabapentin . Patient self care activities - Over the next 90 days, patient will: o Maintain neuropathy medication  regimen.  Medication management . Pharmacist Clinical Goal(s): o Over the next 90 days, patient will work with PharmD and providers to achieve optimal medication adherence . Current pharmacy: [facility] . Interventions o Comprehensive medication review performed. o Continue current medication management strategy . Patient self care activities - Over the next 90 days, patient will: o Focus on medication adherence by filling and taking  medications appropriately  o Take medications as prescribed o Report any questions or concerns to PharmD and/or provider(s)  Please see past updates related to this goal by clicking on the "Past Updates" button in the selected goal         Patient verbalizes understanding of instructions provided today.   The pharmacy team will reach out to the patient again over the next 7 days.   De Blanch, PharmD Clinical Pharmacist [facility] at [facility] [phone]

## 2019-10-25 ENCOUNTER — Other Ambulatory Visit: Payer: Self-pay | Admitting: Family Medicine

## 2019-10-25 DIAGNOSIS — I10 Essential (primary) hypertension: Secondary | ICD-10-CM

## 2019-10-26 ENCOUNTER — Telehealth: Payer: Self-pay | Admitting: Pharmacist

## 2019-10-26 NOTE — Progress Notes (Addendum)
**Note Felicia-Identified via Obfuscation** Chronic Care Management Pharmacy Assistant   Name: Felicia Martin  MRN: [id number] DOB: [birth year]  Reason for Encounter: Follow up call   PCP : Felicia Held, DO   I contacted the patient to follow up from CPP visit with [name]. Patient states she has decided against using the Dexcom. The set up process is too complicated for her, and she feels it would not be covered unless she is on insulin. She also informed me that she tried to take the Pitavastatin 108m tab every other day as directed, but she is still suffering from pain leg cramps that get so bad she cannot walk. Patient will try to control her cholesterol with diet by increasing fruits and vegetables, and reducing carbs. She also said she has almost completely removed meat from her diet.  Allergies:   Allergies  Allergen Reactions  . Statins Other (See Comments)    Muscle aches and weakness  . Zetia [Ezetimibe] Other (See Comments)    Muscle pain  . Fenofibrate Rash    Medications: Outpatient Encounter Medications as of 10/26/2019  Medication Sig  . albuterol (VENTOLIN HFA) 108 (90 Base) MCG/ACT inhaler Inhale 2 puffs into the lungs every 6 (six) hours as needed.  .Marland KitchenamLODipine (NORVASC) 5 MG tablet Take 1 tablet (5 mg total) by mouth daily.  . Blood Glucose Monitoring Suppl (ONETOUCH VERIO) w/Device KIT Use as directed once a day.  Dx code: E11.9  . glucose blood (ONETOUCH VERIO) test strip Use as instructed once a day.  Dx Code: E11.9  . hydrochlorothiazide (HYDRODIURIL) 25 MG tablet Take 1 tablet (25 mg total) by mouth daily.  . Lancets (ONETOUCH DELICA PLUS [id number] MISC 1 each by Does not apply route daily. DX CODE: E11.9  . meloxicam (MOBIC) 15 MG tablet TAKE 1 TABLET BY MOUTH EVERY DAY AS NEEDED FOR PAIN  . metFORMIN (GLUCOPHAGE XR) 500 MG 24 hr tablet Take 1 tablet (500 mg total) by mouth daily with breakfast. NEEDS OV/FOLLOW UP  . ofloxacin (FLOXIN OTIC) 0.3 % OTIC solution Place 10 drops into  both ears daily.  . pregabalin (LYRICA) 50 MG capsule Take 1 capsule (50 mg total) by mouth 2 (two) times daily.   No facility-administered encounter medications on file as of 10/26/2019.    Current Diagnosis: Patient Active Problem List   Diagnosis Date Noted  . DDD (degenerative disc disease), thoracolumbar 10/06/2019  . Uncontrolled type 2 diabetes mellitus with hyperglycemia ([place] 10/06/2019  . Hyperlipidemia associated with type 2 diabetes mellitus ([place] 10/06/2019  . Cerumen impaction 09/21/2018  . Impacted cerumen of both ears 09/16/2018  . Lower extremity edema 07/24/2018  . DM (diabetes mellitus) type II uncontrolled, periph vascular disorder ([place] 06/02/2018  . Acute left-sided low back pain without sciatica 05/08/2018  . Hyperlipidemia 01/29/2017  . Colon cancer screening 10/21/2014  . Dysuria 03/17/2014  . Essential hypertension 03/05/2014  . Routine general medical examination at a health care facility 12/07/2013  . Elevated blood pressure 12/07/2013  . Frequent urination at night 12/07/2013  . Nonspecific abnormal electrocardiogram (ECG) (EKG) 12/07/2013  . CANDIDIASIS, VULVAR 05/12/2010  . UTI 05/04/2010  . DEGENERATIVE JOINT DISEASE 09/22/2008  . Dysphagia 09/22/2008  . OSTEOPENIA 11/05/2007  . TELANGIECTASIA 05/29/2007  . HYPERCHOLESTEROLEMIA 02/01/2007  . PULMONARY FIBROSIS 02/01/2007  . SARCOIDOSIS 11/20/2006  . Asthma 11/20/2006    Goals Addressed   None     Follow-Up:  Let the patient know how to move forward with returning the  Dexcom.  Felicia Martin, [place] Pharmacist Assistant [phone]  Reviewed by: Felicia Martin, PharmD Clinical Pharmacist [facility] at [facility] [phone]

## 2019-10-27 NOTE — Chronic Care Management (AMB) (Signed)
Patient can discard Dexcom if she no longer wants to utilize it. Called the patient to inform her. Left a message.  Fanny Skates, [place] Pharmacist Assistant [phone]

## 2019-11-11 ENCOUNTER — Other Ambulatory Visit: Payer: Self-pay | Admitting: Family Medicine

## 2019-11-11 DIAGNOSIS — IMO0002 Reserved for concepts with insufficient information to code with codable children: Secondary | ICD-10-CM

## 2019-11-25 ENCOUNTER — Other Ambulatory Visit: Payer: Self-pay | Admitting: Family Medicine

## 2019-11-25 DIAGNOSIS — IMO0002 Reserved for concepts with insufficient information to code with codable children: Secondary | ICD-10-CM

## 2019-11-25 DIAGNOSIS — I10 Essential (primary) hypertension: Secondary | ICD-10-CM

## 2019-11-27 ENCOUNTER — Telehealth: Payer: Self-pay | Admitting: Pharmacist

## 2019-11-27 NOTE — Progress Notes (Addendum)
Chronic Care Management Pharmacy Assistant   Name: EDMUND RICK  MRN: [id number] DOB: [birth year]  Reason for Encounter: Disease State  Patient Questions:  1.  Have you seen any other providers since your last visit? No  2.  Any changes in your medicines or health? No    PCP : Ann Held, DO   Their chronic conditions include: Asthma, Diabetes, Hypertension, Hyperlipidemia, Chronic Pain, Osteopenia  There have been no OV's Consults, or Hospitalizations since their last CCM visit with the clinical pharmacist.  Allergies:   Allergies  Allergen Reactions  . Statins Other (See Comments)    Muscle aches and weakness  . Zetia [Ezetimibe] Other (See Comments)    Muscle pain  . Fenofibrate Rash    Medications: Outpatient Encounter Medications as of 11/27/2019  Medication Sig  . albuterol (VENTOLIN HFA) 108 (90 Base) MCG/ACT inhaler Inhale 2 puffs into the lungs every 6 (six) hours as needed.  Marland Kitchen amLODipine (NORVASC) 5 MG tablet TAKE 1 TABLET(5 MG) BY MOUTH DAILY  . Blood Glucose Monitoring Suppl (ONETOUCH VERIO) w/Device KIT Use as directed once a day.  Dx code: E11.9  . glucose blood (ONETOUCH VERIO) test strip Use as instructed once a day.  Dx Code: E11.9  . hydrochlorothiazide (HYDRODIURIL) 25 MG tablet Take 1 tablet (25 mg total) by mouth daily.  . Lancets (ONETOUCH DELICA PLUS [id number]) MISC USE ONCE A DAY  . meloxicam (MOBIC) 15 MG tablet TAKE 1 TABLET BY MOUTH EVERY DAY AS NEEDED FOR PAIN  . metFORMIN (GLUCOPHAGE-XR) 500 MG 24 hr tablet TAKE 1 TABLET(500 MG) BY MOUTH DAILY WITH BREAKFAST. FOLLOW UP  . ofloxacin (FLOXIN OTIC) 0.3 % OTIC solution Place 10 drops into both ears daily.  . pregabalin (LYRICA) 50 MG capsule Take 1 capsule (50 mg total) by mouth 2 (two) times daily.   No facility-administered encounter medications on file as of 11/27/2019.    Current Diagnosis: Patient Active Problem List   Diagnosis Date Noted  . DDD (degenerative disc  disease), thoracolumbar 10/06/2019  . Uncontrolled type 2 diabetes mellitus with hyperglycemia ([place]) 10/06/2019  . Hyperlipidemia associated with type 2 diabetes mellitus ([place]) 10/06/2019  . Cerumen impaction 09/21/2018  . Impacted cerumen of both ears 09/16/2018  . Lower extremity edema 07/24/2018  . DM (diabetes mellitus) type II uncontrolled, periph vascular disorder ([place]) 06/02/2018  . Acute left-sided low back pain without sciatica 05/08/2018  . Hyperlipidemia 01/29/2017  . Colon cancer screening 10/21/2014  . Dysuria 03/17/2014  . Essential hypertension 03/05/2014  . Routine general medical examination at a health care facility 12/07/2013  . Elevated blood pressure 12/07/2013  . Frequent urination at night 12/07/2013  . Nonspecific abnormal electrocardiogram (ECG) (EKG) 12/07/2013  . CANDIDIASIS, VULVAR 05/12/2010  . UTI 05/04/2010  . DEGENERATIVE JOINT DISEASE 09/22/2008  . Dysphagia 09/22/2008  . OSTEOPENIA 11/05/2007  . TELANGIECTASIA 05/29/2007  . HYPERCHOLESTEROLEMIA 02/01/2007  . PULMONARY FIBROSIS 02/01/2007  . SARCOIDOSIS 11/20/2006  . Asthma 11/20/2006    Goals Addressed   None    Recent Relevant Labs: Lab Results  Component Value Date/Time   HGBA1C 6.6 (H) 10/05/2019 11:13 AM   HGBA1C 6.8 (H) 07/09/2019 10:07 AM   MICROALBUR 1.0 10/05/2019 11:13 AM   MICROALBUR 1.9 07/09/2019 10:07 AM    Kidney Function Lab Results  Component Value Date/Time   CREATININE 0.81 10/05/2019 11:13 AM   CREATININE 0.95 07/09/2019 10:07 AM   GFR 81.37 10/05/2019 11:13 AM   GFRNONAA 82.80 09/29/2009 03:47  PM   GFRAA 91 08/18/2007 09:14 AM    . Current antihyperglycemic regimen:  o  Metformin XR 500 mg daily  . What recent interventions/DTPs have been made to improve glycemic control:  o none . Have there been any recent hospitalizations or ED visits since last visit with CPP? No . Patient denies hypoglycemic symptoms, including None . Patient denies hyperglycemic  symptoms, including none . How often are you checking your blood sugar? Patient states she checks her blood sugar two to three times a week because she has trouble getting enough blood to test. . What are your blood sugars ranging?  o Before breakfast at 10am 132  . During the week, how often does your blood glucose drop below 70? Never . Are you checking your feet daily/regularly? Yes  Patient states she has been limiting cards and has stopped eating fried foods. She has also incorporated salads into her diet. Reminded her of upcoming appointment with clinical pharmacist on Monday November 8th at 1pm.  Adherence Review: Is the patient currently on a STATIN medication? No Is the patient currently on ACE/ARB medication? No Does the patient have >5 day gap between last estimated fill dates? No   Follow-Up:  Pharmacist Review   Fanny Skates, [place] Pharmacist Assistant [phone]  Reviewed by: De Blanch, PharmD Clinical Pharmacist [facility] at [facility] [phone]

## 2020-01-11 ENCOUNTER — Telehealth: Payer: Self-pay

## 2020-01-11 NOTE — Telephone Encounter (Signed)
Caller states she is needing a notice for a handicap sticker.  Telephone: [phone] or [phone]

## 2020-01-13 ENCOUNTER — Other Ambulatory Visit: Payer: Self-pay | Admitting: *Deleted

## 2020-01-13 ENCOUNTER — Other Ambulatory Visit: Payer: Self-pay | Admitting: Family Medicine

## 2020-01-13 DIAGNOSIS — M5135 Other intervertebral disc degeneration, thoracolumbar region: Secondary | ICD-10-CM

## 2020-01-13 MED ORDER — ONETOUCH VERIO VI STRP: ORAL_STRIP | 1 refills | Status: DC

## 2020-01-13 NOTE — Telephone Encounter (Signed)
Attempted to call patient on both numbers. No answer. LVM on cell. Advising patient to call back if she will pick up placard or if we can mail it.

## 2020-01-13 NOTE — Telephone Encounter (Signed)
Noted. Will have Dr. Etter Sjogren sign tomorrow

## 2020-01-13 NOTE — Telephone Encounter (Signed)
Patient would like for it to be mail

## 2020-01-14 ENCOUNTER — Telehealth: Payer: Self-pay | Admitting: Pharmacist

## 2020-01-14 NOTE — Progress Notes (Addendum)
     Chronic Care Management Pharmacy Assistant   Name: Felicia Martin  MRN: [id number] DOB: 07/25/1932  Reason for Encounter: Medication Review  Patient Questions:  1.  Have you seen any other providers since your last visit? No  2.  Any changes in your medicines or health? No  PCP : Ann Held, DO  Allergies:   Allergies  Allergen Reactions   Statins Other (See Comments)    Muscle aches and weakness   Zetia [Ezetimibe] Other (See Comments)    Muscle pain   Fenofibrate Rash    Medications: Outpatient Encounter Medications as of 01/14/2020  Medication Sig   albuterol (VENTOLIN HFA) 108 (90 Base) MCG/ACT inhaler Inhale 2 puffs into the lungs every 6 (six) hours as needed.   amLODipine (NORVASC) 5 MG tablet TAKE 1 TABLET(5 MG) BY MOUTH DAILY   Blood Glucose Monitoring Suppl (ONETOUCH VERIO) w/Device KIT Use as directed once a day.  Dx code: E11.9   glucose blood (ONETOUCH VERIO) test strip Use as instructed once a day.  Dx Code: E11.9   hydrochlorothiazide (HYDRODIURIL) 25 MG tablet Take 1 tablet (25 mg total) by mouth daily.   Lancets (ONETOUCH DELICA PLUS [id number]) MISC USE ONCE A DAY   meloxicam (MOBIC) 15 MG tablet TAKE 1 TABLET BY MOUTH EVERY DAY AS NEEDED FOR PAIN   metFORMIN (GLUCOPHAGE-XR) 500 MG 24 hr tablet TAKE 1 TABLET(500 MG) BY MOUTH DAILY WITH BREAKFAST. FOLLOW UP   ofloxacin (FLOXIN OTIC) 0.3 % OTIC solution Place 10 drops into both ears daily.   pregabalin (LYRICA) 50 MG capsule Take 1 capsule (50 mg total) by mouth 2 (two) times daily.   No facility-administered encounter medications on file as of 01/14/2020.    Current Diagnosis: Patient Active Problem List   Diagnosis Date Noted   DDD (degenerative disc disease), thoracolumbar 10/06/2019   Uncontrolled type 2 diabetes mellitus with hyperglycemia ([place]) 10/06/2019   Hyperlipidemia associated with type 2 diabetes mellitus ([place]) 10/06/2019   Cerumen impaction 09/21/2018   Impacted cerumen  of both ears 09/16/2018   Lower extremity edema 07/24/2018   DM (diabetes mellitus) type II uncontrolled, periph vascular disorder ([place]) 06/02/2018   Acute left-sided low back pain without sciatica 05/08/2018   Hyperlipidemia 01/29/2017   Colon cancer screening 10/21/2014   Dysuria 03/17/2014   Essential hypertension 03/05/2014   Routine general medical examination at a health care facility 12/07/2013   Elevated blood pressure 12/07/2013   Frequent urination at night 12/07/2013   Nonspecific abnormal electrocardiogram (ECG) (EKG) 12/07/2013   CANDIDIASIS, VULVAR 05/12/2010   UTI 05/04/2010   DEGENERATIVE JOINT DISEASE 09/22/2008   Dysphagia 09/22/2008   OSTEOPENIA 11/05/2007   TELANGIECTASIA 05/29/2007   HYPERCHOLESTEROLEMIA 02/01/2007   PULMONARY FIBROSIS 02/01/2007   SARCOIDOSIS 11/20/2006   Asthma 11/20/2006    Goals Addressed   None    01/14/20 Review patient's chart.  No medication changes.  Patient request a handicap sticker on 01-11-20 from PCP  Follow-Up:  Pharmacist Review   [place] Shannon, [place] [facility] [place] Pharmacist Assistant [phone]  Reviewed by: De Blanch, PharmD Clinical Pharmacist [facility] at [facility] [phone]

## 2020-01-19 NOTE — Telephone Encounter (Signed)
Form mailed

## 2020-01-25 ENCOUNTER — Ambulatory Visit: Payer: Self-pay | Admitting: Pharmacist

## 2020-01-25 ENCOUNTER — Telehealth: Payer: Medicare Other

## 2020-01-25 NOTE — Chronic Care Management (AMB) (Signed)
  Chronic Care Management   Outreach Note  01/25/2020 Name: Felicia Martin MRN: [id number] DOB: Oct 01, 1932  Referred by: Ann Held, DO Reason for referral : No chief complaint on file.   Third unsuccessful telephone outreach was attempted today. The patient was referred to the pharmacist for assistance with care management and care coordination.     Follow Up Plan:  -Attempt to schedule follow up at next DM assessment  De Blanch, PharmD Clinical Pharmacist [place] [facility] at [facility] [phone]

## 2020-01-25 NOTE — Chronic Care Management (AMB) (Deleted)
Chronic Care Management Pharmacy  Name: Felicia Martin  MRN: [id number] DOB: [birth year]   Chief Complaint/ HPI  Felicia Martin,  86 y.o. , female presents for their Follow-Up CCM visit with the clinical pharmacist In office   PCP : Ann Held, DO  Their chronic conditions include: Asthma, Diabetes, Hypertension, Hyperlipidemia, Chronic Pain, Osteopenia  Office Visits: None since last CCM visit on 10/21/19.    Consult Visit: None since last CCM visit on 10/21/19.   Allergies  Allergen Reactions  . Statins Other (See Comments)    Muscle aches and weakness  . Zetia [Ezetimibe] Other (See Comments)    Muscle pain  . Fenofibrate Rash    Medications: Outpatient Encounter Medications as of 01/25/2020  Medication Sig  . albuterol (VENTOLIN HFA) 108 (90 Base) MCG/ACT inhaler Inhale 2 puffs into the lungs every 6 (six) hours as needed.  Marland Kitchen amLODipine (NORVASC) 5 MG tablet TAKE 1 TABLET(5 MG) BY MOUTH DAILY  . Blood Glucose Monitoring Suppl (ONETOUCH VERIO) w/Device KIT Use as directed once a [name].  Dx code: E11.9  . glucose blood (ONETOUCH VERIO) test strip Use as instructed once a [name].  Dx Code: E11.9  . hydrochlorothiazide (HYDRODIURIL) 25 MG tablet Take 1 tablet (25 mg total) by mouth daily.  . Lancets (ONETOUCH DELICA PLUS [id number]) MISC USE ONCE A Felicia Martin  . meloxicam (MOBIC) 15 MG tablet TAKE 1 TABLET BY MOUTH EVERY Chetara Kropp AS NEEDED FOR PAIN  . metFORMIN (GLUCOPHAGE-XR) 500 MG 24 hr tablet TAKE 1 TABLET(500 MG) BY MOUTH DAILY WITH BREAKFAST. FOLLOW UP  . ofloxacin (FLOXIN OTIC) 0.3 % OTIC solution Place 10 drops into both ears daily.  . pregabalin (LYRICA) 50 MG capsule Take 1 capsule (50 mg total) by mouth 2 (two) times daily.   No facility-administered encounter medications on file as of 01/25/2020.   SDOH Screenings   Alcohol Screen:   . Last Alcohol Screening Score (AUDIT): Not on file  Depression (PHQ2-9): Low Risk   . PHQ-2 Score: 0  Financial Resource  Strain: Low Risk   . Difficulty of Paying Living Expenses: Not hard at all  Food Insecurity:   . Worried About Charity fundraiser in the Last Year: Not on file  . Ran Out of Food in the Last Year: Not on file  Housing:   . Last Housing Risk Score: Not on file  Physical Activity:   . Days of Exercise per Week: Not on file  . Minutes of Exercise per Session: Not on file  Social Connections:   . Frequency of Communication with Friends and Family: Not on file  . Frequency of Social Gatherings with Friends and Family: Not on file  . Attends Religious Services: Not on file  . Active Member of Clubs or Organizations: Not on file  . Attends Archivist Meetings: Not on file  . Marital Status: Not on file  Stress:   . Feeling of Stress : Not on file  Tobacco Use: Medium Risk  . Smoking Tobacco Use: Former Smoker  . Smokeless Tobacco Use: Never Used  Transportation Needs: No Transportation Needs  . Lack of Transportation (Medical): No  . Lack of Transportation (Non-Medical): No     Current Diagnosis/Assessment:    Goals Addressed   None   Social Hx:  Has 4 daughters. Has one that lives down the street. Felicia Martin, Felicia Martin, [place] [employer]  Divorced and widowed. She has grand children, great grandchildren, and great grand children Retired from 2005.  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 She then went to work at [employer] for 13 years and finally retired from there. Has been retired from [employer] x 2 years.  Movement/Exercise Patient comes into the office using cane. She states she overdid it in her garden yesterday.  States she uses a foot pedaling machine about 2-3 hours per Felicia Martin while watching TV. She can't stand long due to back pain or walk far distances due to getting SOB.   Diet B - sausage biscuit from [place] w/ [place] D - mostly eats out  Hypertension   BP goal is:  <140/90  Office blood pressures are  BP Readings from Last 3  Encounters:  10/05/19 140/78  09/11/19 138/71  07/09/19 120/80   Patient checks BP at home {CHL HP BP Monitoring Frequency:[phone]} Patient home BP readings are ranging: ***  Patient has failed these meds in the past: *** Patient is currently controlled on the following medications:  . Amlodipine 52m daily  . HCTZ 232mdaily  We discussed {CHL HP [facility] Pharmacy discussion:[phone]}  Plan -Continue current medications    Diabetes   A1c goal <7%  Recent Relevant Labs: Lab Results  Component Value Date/Time   HGBA1C 6.6 (H) 10/05/2019 11:13 AM   HGBA1C 6.8 (H) 07/09/2019 10:07 AM   GFR 81.37 10/05/2019 11:13 AM   GFR 67.74 07/09/2019 10:07 AM   MICROALBUR 1.0 10/05/2019 11:13 AM   MICROALBUR 1.9 07/09/2019 10:07 AM    Last diabetic Eye exam: No results found for: HMDIABEYEEXA  Last diabetic Foot exam: No results found for: HMDIABFOOTEX   Checking BG: Weekly (3-4 times per week)  Recent FBG Readings: "In range" 120s, lowest 98-282 after eating a honey bun Patient has failed these meds in past: None noted  Patient is currently controlled, but trending up on the following medications: . Marland Kitchenetformin XR 500 mg daily   States she was taking metformin twice daily, but the second dose caused her to be nauseous. She is tolerating the once daily dose ok.   We discussed: how her a1c is trending up and she is on the borderline of not being within goal. We discussed limiting carbohydrates.   Update 10/05/19 Patient is interested in getting Dexcom so prevent from having to poke her finger. Unsure if this will be covered under patient's insurance since she is not taking insulin. Will look into this.  Update 10/21/19 Significant time spent getting patient's phone set up to use with Dexcom sample placement. Patient is unaware of her AppleID password which prevents her from being able to download the Dexcom G6 app needed to read blood sugars. Placed Dexcom on patient's left side  abdomen. She tolerated the placement well. She states her daughter will help her get her Apple ID set up and/or call me if she can not get it figured out. Also provided patient with customer support number for Dexcom.   Update 01/25/20 Noted pt unable to get Dexcom sample to work properly.  Plan -Continue current medications   Hyperlipidemia   LDL goal <100  Lipid Panel     Component Value Date/Time   CHOL 291 (H) 10/05/2019 1113   TRIG 104.0 10/05/2019 1113   HDL 57.00 10/05/2019 1113   LDLCALC 213 (H) 10/05/2019 1113   LDLDIRECT 230.7 03/18/2013 0805    Hepatic Function Latest Ref Rng & Units 10/05/2019 07/09/2019 10/30/2018  Total Protein 6.0 - 8.3 g/dL 7.9 7.9 8.1  Albumin 3.5 - 5.2  g/dL 4.2 4.2 4.4  AST 0 - 37 U/L '14 16 16  ' ALT 0 - 35 U/L '7 9 10  ' Alk Phosphatase 39 - 117 U/L 63 58 71  Total Bilirubin 0.2 - 1.2 mg/dL 0.4 0.5 0.5  Bilirubin, Direct 0.0 - 0.3 mg/dL - - -     The ASCVD Risk score ([place]., et al., 2013) failed to calculate for the following reasons:   The 2013 ASCVD risk score is only valid for ages 66 to 20   Patient has failed these meds in past: atorvastatin, ezetimibe, fenofibrate, pravastatin, rosuvastatin, simvastatin Patient is currently uncontrolled on the following medications:  . Pitavastatin 4 mg daily (not taking)  States she is not taking anything for her cholesterol because of muscle aches; Cramps in her legs  States her leg cramping will do well for a while then start back while taking cholesterol medication.  We discussed:  importance of LDL goal noting patient's resistance to cholesterol medications   Update 10/05/19 States she has not taken pitavastatin in a week. Discussed patient taking every other Fumi Guadron and she is agreeable to this.  She states she would not be able to afford injectable cholesterol medication. This was attempted in the past and would cost the patient thousands of dollars, so she seems hesitant to follow with lipid  clinic.  Update 10/21/19 Will have pharmacy team to follow up on this next week. Significant time spent placing Dexcom.  Update 01/25/20 How are you taking pitavastatin?  Plan -Consider taking pitavastatin every other Katelen Luepke vs daily to see if this reduces cramping  De Blanch, PharmD Clinical Pharmacist [place] [facility] at [facility] [phone]

## 2020-03-25 ENCOUNTER — Telehealth: Payer: Self-pay | Admitting: Pharmacist

## 2020-03-25 NOTE — Progress Notes (Addendum)
Chronic Care Management Pharmacy Assistant   Name: Felicia Martin  MRN: [id number] DOB: 11/24/1932  Reason for Encounter: DM Disease State   PCP : Ann Held, DO   Their chronic conditions include: Asthma, Diabetes, Hypertension, Hyperlipidemia, Chronic Pain, Osteopenia.  Office Visits: None since last CCM visit on 10-21-2019   Consult Visit: None since last CCM visit on 10-21-2019.  Allergies:   Allergies  Allergen Reactions   Statins Other (See Comments)    Muscle aches and weakness   Zetia [Ezetimibe] Other (See Comments)    Muscle pain   Fenofibrate Rash    Medications: Outpatient Encounter Medications as of 03/25/2020  Medication Sig   albuterol (VENTOLIN HFA) 108 (90 Base) MCG/ACT inhaler Inhale 2 puffs into the lungs every 6 (six) hours as needed.   amLODipine (NORVASC) 5 MG tablet TAKE 1 TABLET(5 MG) BY MOUTH DAILY   Blood Glucose Monitoring Suppl (ONETOUCH VERIO) w/Device KIT Use as directed once a day.  Dx code: E11.9   glucose blood (ONETOUCH VERIO) test strip Use as instructed once a day.  Dx Code: E11.9   hydrochlorothiazide (HYDRODIURIL) 25 MG tablet Take 1 tablet (25 mg total) by mouth daily.   Lancets (ONETOUCH DELICA PLUS [id number]) MISC USE ONCE A DAY   meloxicam (MOBIC) 15 MG tablet TAKE 1 TABLET BY MOUTH EVERY DAY AS NEEDED FOR PAIN   metFORMIN (GLUCOPHAGE-XR) 500 MG 24 hr tablet TAKE 1 TABLET(500 MG) BY MOUTH DAILY WITH BREAKFAST. FOLLOW UP   ofloxacin (FLOXIN OTIC) 0.3 % OTIC solution Place 10 drops into both ears daily.   pregabalin (LYRICA) 50 MG capsule Take 1 capsule (50 mg total) by mouth 2 (two) times daily.   No facility-administered encounter medications on file as of 03/25/2020.    Current Diagnosis: Patient Active Problem List   Diagnosis Date Noted   DDD (degenerative disc disease), thoracolumbar 10/06/2019   Uncontrolled type 2 diabetes mellitus with hyperglycemia ([place]) 10/06/2019   Hyperlipidemia associated with type 2  diabetes mellitus ([place]) 10/06/2019   Cerumen impaction 09/21/2018   Impacted cerumen of both ears 09/16/2018   Lower extremity edema 07/24/2018   DM (diabetes mellitus) type II uncontrolled, periph vascular disorder ([place]) 06/02/2018   Acute left-sided low back pain without sciatica 05/08/2018   Hyperlipidemia 01/29/2017   Colon cancer screening 10/21/2014   Dysuria 03/17/2014   Essential hypertension 03/05/2014   Routine general medical examination at a health care facility 12/07/2013   Elevated blood pressure 12/07/2013   Frequent urination at night 12/07/2013   Nonspecific abnormal electrocardiogram (ECG) (EKG) 12/07/2013   CANDIDIASIS, VULVAR 05/12/2010   UTI 05/04/2010   DEGENERATIVE JOINT DISEASE 09/22/2008   Dysphagia 09/22/2008   OSTEOPENIA 11/05/2007   TELANGIECTASIA 05/29/2007   HYPERCHOLESTEROLEMIA 02/01/2007   PULMONARY FIBROSIS 02/01/2007   SARCOIDOSIS 11/20/2006   Asthma 11/20/2006    Goals Addressed   None    Recent Relevant Labs: Lab Results  Component Value Date/Time   HGBA1C 6.6 (H) 10/05/2019 11:13 AM   HGBA1C 6.8 (H) 07/09/2019 10:07 AM   MICROALBUR 1.0 10/05/2019 11:13 AM   MICROALBUR 1.9 07/09/2019 10:07 AM    Kidney Function Lab Results  Component Value Date/Time   CREATININE 0.81 10/05/2019 11:13 AM   CREATININE 0.95 07/09/2019 10:07 AM   GFR 81.37 10/05/2019 11:13 AM   GFRNONAA 82.80 09/29/2009 03:47 PM   GFRAA 91 08/18/2007 09:14 AM    Current antihyperglycemic regimen:  Metformin XR 500 mg daily- Patient states she takes this  medication every other day. Stated she began to develop a rash on her stomach which led her to begin taking Metformin every other day approximately two weeks ago.  What recent interventions/DTPs have been made to improve glycemic control:  None  Have there been any recent hospitalizations or ED visits since last visit with CPP? No   Patient denies hypoglycemic symptoms.  Patient denies hyperglycemic symptoms.  Patient reports feeling tired, but contributes that to old age.  How often are you checking your blood sugar? once daily   What are your blood sugars ranging?  Fasting: 142, 130   During the week, how often does your blood glucose drop below 70? Never   Are you checking your feet daily/regularly? Yes, no issues to report  Adherence Review: Is the patient currently on a STATIN medication? No Is the patient currently on ACE/ARB medication? No Does the patient have >5 day gap between last estimated fill dates? Yes  Offered to reschedule her visit with the CPP since she missed her last appointment. Patient declined.  Follow-Up:  Pharmacist Review   Fanny Skates, [place] Pharmacist Assistant [phone]  Metformin last filled 11/11/19 for 90DS most likely to non-adherence. Unsure of rash.   2 minutes spent in review, coordination, and documentation.   Reviewed by: De Blanch, PharmD, BCACP Clinical Pharmacist [place] [facility] at [facility] [phone]

## 2020-04-04 ENCOUNTER — Telehealth: Payer: Self-pay | Admitting: Pharmacist

## 2020-04-04 NOTE — Progress Notes (Signed)
error 

## 2020-04-08 ENCOUNTER — Ambulatory Visit: Payer: Medicare Other | Admitting: Family Medicine

## 2020-05-27 ENCOUNTER — Encounter: Payer: Self-pay | Admitting: Family Medicine

## 2020-05-27 ENCOUNTER — Ambulatory Visit (INDEPENDENT_AMBULATORY_CARE_PROVIDER_SITE_OTHER): Payer: Medicare Other | Admitting: Family Medicine

## 2020-05-27 ENCOUNTER — Other Ambulatory Visit: Payer: Self-pay

## 2020-05-27 VITALS — BP 150/96 | HR 78 | Temp 97.8°F | Resp 20 | Ht 60.0 in | Wt 216.4 lb

## 2020-05-27 DIAGNOSIS — I10 Essential (primary) hypertension: Secondary | ICD-10-CM

## 2020-05-27 DIAGNOSIS — E785 Hyperlipidemia, unspecified: Secondary | ICD-10-CM | POA: Diagnosis not present

## 2020-05-27 DIAGNOSIS — E1165 Type 2 diabetes mellitus with hyperglycemia: Secondary | ICD-10-CM | POA: Diagnosis not present

## 2020-05-27 DIAGNOSIS — R600 Localized edema: Secondary | ICD-10-CM | POA: Diagnosis not present

## 2020-05-27 DIAGNOSIS — E1151 Type 2 diabetes mellitus with diabetic peripheral angiopathy without gangrene: Secondary | ICD-10-CM | POA: Diagnosis not present

## 2020-05-27 DIAGNOSIS — E1169 Type 2 diabetes mellitus with other specified complication: Secondary | ICD-10-CM | POA: Diagnosis not present

## 2020-05-27 DIAGNOSIS — M1909 Primary osteoarthritis, other specified site: Secondary | ICD-10-CM | POA: Diagnosis not present

## 2020-05-27 DIAGNOSIS — E1140 Type 2 diabetes mellitus with diabetic neuropathy, unspecified: Secondary | ICD-10-CM

## 2020-05-27 DIAGNOSIS — IMO0002 Reserved for concepts with insufficient information to code with codable children: Secondary | ICD-10-CM

## 2020-05-27 LAB — LIPID PANEL
Cholesterol: 282 mg/dL — ABNORMAL HIGH (ref 0–200)
HDL: 55.3 mg/dL (ref >39.00)
LDL Cholesterol: 202 mg/dL — ABNORMAL HIGH (ref 0–99)
NonHDL: 226.69
Total CHOL/HDL Ratio: 5
Triglycerides: 125 mg/dL (ref 0.0–149.0)
VLDL: 25 mg/dL (ref 0.0–40.0)

## 2020-05-27 LAB — COMPREHENSIVE METABOLIC PANEL
ALT: 8 U/L (ref 0–35)
AST: 15 U/L (ref 0–37)
Albumin: 3.9 g/dL (ref 3.5–5.2)
Alkaline Phosphatase: 65 U/L (ref 39–117)
BUN: 21 mg/dL (ref 6–23)
CO2: 26 mEq/L (ref 19–32)
Calcium: 9.6 mg/dL (ref 8.4–10.5)
Chloride: 105 mEq/L (ref 96–112)
Creatinine, Ser: 0.84 mg/dL (ref 0.40–1.20)
GFR: 63.4 mL/min (ref >60.00)
Glucose, Bld: 128 mg/dL — ABNORMAL HIGH (ref 70–99)
Potassium: 4.3 mEq/L (ref 3.5–5.1)
Sodium: 138 mEq/L (ref 135–145)
Total Bilirubin: 0.4 mg/dL (ref 0.2–1.2)
Total Protein: 8 g/dL (ref 6.0–8.3)

## 2020-05-27 LAB — MICROALBUMIN / CREATININE URINE RATIO
Creatinine,U: 101.5 mg/dL
Microalb Creat Ratio: 1.2 mg/g (ref 0.0–30.0)
Microalb, Ur: 1.2 mg/dL (ref 0.0–1.9)

## 2020-05-27 LAB — HEMOGLOBIN A1C: Hgb A1c MFr Bld: 7.3 % — ABNORMAL HIGH (ref 4.6–6.5)

## 2020-05-27 MED ORDER — METFORMIN HCL 500 MG PO TABS: 500.0000 mg | ORAL_TABLET | Freq: Every day | ORAL | 1 refills | Status: DC

## 2020-05-27 MED ORDER — MELOXICAM 7.5 MG PO TABS: 7.5000 mg | ORAL_TABLET | Freq: Every day | ORAL | 3 refills | Status: DC

## 2020-05-27 MED ORDER — HYDROCHLOROTHIAZIDE 25 MG PO TABS: 25.0000 mg | ORAL_TABLET | Freq: Every day | ORAL | 3 refills | Status: DC

## 2020-05-27 MED ORDER — AMLODIPINE BESYLATE 5 MG PO TABS: ORAL_TABLET | ORAL | 1 refills | Status: DC

## 2020-05-27 NOTE — Assessment & Plan Note (Signed)
hgba1c to be checked, minimize simple carbs. Increase exercise as tolerated. Continue current meds  

## 2020-05-27 NOTE — Assessment & Plan Note (Signed)
Encouraged heart healthy diet, increase exercise, avoid trans fats, consider a krill oil cap daily 

## 2020-05-27 NOTE — Assessment & Plan Note (Signed)
Poorly controlled will alter medications, encouraged DASH diet, minimize caffeine and obtain adequate sleep. Report concerning symptoms and follow up as directed and as needed Take hctz daily F/u 2-3 weeks

## 2020-05-27 NOTE — Patient Instructions (Signed)

## 2020-05-27 NOTE — Assessment & Plan Note (Signed)
con't hctz

## 2020-05-27 NOTE — Progress Notes (Addendum)
Patient ID: Felicia Martin, female    DOB: 12/15/1932  Age: 87 y.o. MRN: [id number]    Subjective:  Subjective  HPI Felicia Martin presents for office visit today. She is requesting referral to podiatrist secondary to radiating bilateral feet tingling and stinging. She reports feeling pins and needles radiating from her low leg to her feet that started since 09/2019 after being prescribed Lyrica. She states the episodes began as soon as she is about to go to sleep. The symptoms happen occasionally.   She report that she is managing her at home blood sugars well (98-152).  She complains of skin changes on her abdomen and neck area due to her 500 mg Metformin daily P.O. medications. She reports that the other Metformin dose made her feel sick at night so the dose was changed to 500 mg Metformin daily P.O. She denies any chest pain, HA, fever, chills, cough, abdominal pain, dizziness, sore throat, or urinary problems.   BP Readings from Last 3 Encounters:  05/27/20 (!) 150/96  10/05/19 140/78  09/11/19 138/71       Review of Systems  Constitutional: Negative for activity change, appetite change, chills, fatigue and unexpected weight change.  HENT: Negative for ear pain, rhinorrhea, sinus pressure, sinus pain and sore throat.   Eyes: Negative for pain.  Respiratory: Negative for cough, chest tightness and shortness of breath.   Cardiovascular: Negative for chest pain and palpitations.  Gastrointestinal: Negative for abdominal pain, blood in stool, diarrhea, nausea and vomiting.  Genitourinary: Negative for difficulty urinating, dysuria, flank pain, hematuria, vaginal bleeding, vaginal discharge and vaginal pain.  Musculoskeletal: Negative for back pain, myalgias and neck pain.       (+)tingling and stinging radiating from bilateral LE to feet  Skin: Positive for rash.  Neurological: Negative for numbness and headaches.  Psychiatric/Behavioral: Negative for behavioral problems and  dysphoric mood. The patient is not nervous/anxious.     History Past Medical History:  Diagnosis Date  . Asthma   . GERD (gastroesophageal reflux disease)   . Hypercholesterolemia   . Hypertension   . Pulmonary fibrosis ([place])   . Sarcoidosis   . Surgical history of tubal ligation 1960s    She has a past surgical history that includes Lung surgery (1960); Tubal ligation (1960); Maloney dilation (12/09/14); Colonoscopy (12/09/14); and Esophagogastroduodenoscopy (12/09/14).   Her family history includes Alzheimer's disease in her sister; Cancer in her brother and father; Diabetes in her brother and sister; Hyperlipidemia in an other family member; Hypertension in her sister; Liver cancer in her father and mother.She reports that she quit smoking about 21 years ago. Her smoking use included cigarettes. She has a 20.00 pack-year smoking history. She has never used smokeless tobacco. She reports that she does not drink alcohol and does not use drugs.  Current Outpatient Medications on File Prior to Visit  Medication Sig Dispense Refill  . albuterol (VENTOLIN HFA) 108 (90 Base) MCG/ACT inhaler Inhale 2 puffs into the lungs every 6 (six) hours as needed. 18 g 2  . Blood Glucose Monitoring Suppl (ONETOUCH VERIO) w/Device KIT Use as directed once a day.  Dx code: E11.9 1 kit 0  . glucose blood (ONETOUCH VERIO) test strip Use as instructed once a day.  Dx Code: E11.9 100 each 1  . Lancets (ONETOUCH DELICA PLUS [id number]) MISC USE ONCE A DAY 100 each 1  . meloxicam (MOBIC) 15 MG tablet TAKE 1 TABLET BY MOUTH EVERY DAY AS NEEDED FOR PAIN 30 tablet 2  .  ofloxacin (FLOXIN OTIC) 0.3 % OTIC solution Place 10 drops into both ears daily. 10 mL 1   No current facility-administered medications on file prior to visit.     Objective:  Objective  Physical Exam Vitals and nursing note reviewed.  Constitutional:      General: She is not in acute distress.    Appearance: Normal appearance. She is  well-developed. She is not ill-appearing.  HENT:     Head: Normocephalic and atraumatic.     Right Ear: External ear normal.     Left Ear: External ear normal.     Nose: Nose normal.  Eyes:     General:        Right eye: No discharge.        Left eye: No discharge.     Pupils: Pupils are equal, round, and reactive to light.  Cardiovascular:     Rate and Rhythm: Normal rate and regular rhythm.     Heart sounds: No murmur heard.   Pulmonary:     Effort: Pulmonary effort is normal.     Breath sounds: Normal breath sounds.  Abdominal:     General: Bowel sounds are normal.     Palpations: Abdomen is soft.     Tenderness: There is no abdominal tenderness.  Musculoskeletal:        General: Normal range of motion.     Cervical back: Normal range of motion and neck supple.  Skin:    General: Skin is warm and dry.  Neurological:     Mental Status: She is alert and oriented to person, place, and time.  Psychiatric:        Behavior: Behavior normal.    Diabetic Foot Exam - Simple   Simple Foot Form Diabetic Foot exam was performed with the following findings: Yes 05/27/2020 10:00 AM  Visual Inspection No deformities, no ulcerations, no other skin breakdown bilaterally: Yes Sensation Testing Intact to touch and monofilament testing bilaterally: Yes Pulse Check Posterior Tibialis and Dorsalis pulse intact bilaterally: Yes Comments     BP (!) 150/96   Pulse 78   Temp 97.8 F (36.6 C) (Oral)   Resp 20   Ht 5' (1.524 m)   Wt 216 lb 6.4 oz (98.2 kg)   LMP 03/17/2014   SpO2 98%   BMI 42.26 kg/m  Wt Readings from Last 3 Encounters:  05/27/20 216 lb 6.4 oz (98.2 kg)  10/05/19 211 lb 3.2 oz (95.8 kg)  09/11/19 214 lb 9.6 oz (97.3 kg)     Lab Results  Component Value Date   WBC 4.4 12/16/2013   HGB 12.2 12/16/2013   HCT 36.7 12/16/2013   PLT 279.0 12/16/2013   GLUCOSE 106 (H) 10/05/2019   CHOL 291 (H) 10/05/2019   TRIG 104.0 10/05/2019   HDL 57.00 10/05/2019    LDLDIRECT 230.7 03/18/2013   LDLCALC 213 (H) 10/05/2019   ALT 7 10/05/2019   AST 14 10/05/2019   NA 138 10/05/2019   K 4.4 10/05/2019   CL 104 10/05/2019   CREATININE 0.81 10/05/2019   BUN 26 (H) 10/05/2019   CO2 24 10/05/2019   TSH 2.19 09/29/2009   HGBA1C 6.6 (H) 10/05/2019   MICROALBUR 1.0 10/05/2019    DG Bone Density  Result Date: 05/08/2018 EXAM: DUAL X-RAY ABSORPTIOMETRY (DXA) FOR BONE MINERAL DENSITY IMPRESSION: Felicia Martin CHASE Your patient Felicia Martin completed a BMD test on 05/08/2018 using the [place] (analysis version: 16.SP2) manufactured by [employer]. The  following summarizes the results of our evaluation. PATIENT: Name: Felicia Martin, Felicia Martin Patient ID: [id number] Birth Date: [birth year] Height: 60.0 in. Gender: Female Measured: 05/08/2018 Weight: 212.8 lbs. Indications: Advanced Age, African-American, Estrogen Deficiency, Post Menopausal, Previous Tobacco User Fractures: Treatments: ASSESSMENT: The BMD measured at Femur Neck Left is 0.832 g/cm2 with a T-score of -1.5. This patient is considered osteopenic according to [facility]) criteria. Scan quality was good. Site Region Measured Date Measured Age WHO YA BMD Classification T-score AP Spine L1-L4 05/08/2018 83.4 Osteopenia -1.3 1.021 g/cm2 DualFemur Neck Left 05/08/2018 83.4 years Osteopenia -1.5 0.832 g/cm2 Left Forearm Radius 33% 05/08/2018 83.2 Normal -1.0 0.788 g/cm2 World Health Organization [facility]) criteria for post-menopausal, Caucasian Women: Normal       T-score at or above -1 SD Osteopenia   T-score between -1 and -2.5 SD Osteoporosis T-score at or below -2.5 SD RECOMMENDATION: 1. All patients should optimize calcium and vitamin D intake. 2. Consider FDA-approved medical therapies in postmenopausal women and men aged 64 years and older, based on the following: a. A hip or vertebral(clinical or morphometric) fracture. b. T-Score < -2.5 at the femoral neck or spine after appropriate  evaluation to exclude secondary causes c. Low bone mass (T-score between -1.0 and -2.5 at the femoral neck or spine) and a 10 year probability of a hip fracture >3% or a 10 year probability of major osteoporosis-related fracture > 20% based on the [place]-adapted WHO algorithm d. Clinical judgement and/or patient preferences may indicate treatment for people with 10-year fracture probabilities above or below these levels FOLLOW-UP: Patients with diagnosis of osteoporosis or at high risk for fracture should have regular bone mineral density tests. For patients eligible for Medicare, routine testing is allowed once every 2 years. The testing frequency can be increased to one year for patients who have rapidly progressing disease, those who are receiving or discontinuing medical therapy to restore bone mass, or have additional risk factors. I have reviewed this report and agree with the above findings. [facility] Patient: Felicia Martin Referring Physician: Rosalita Chessman CHASE Birth Date: [birth year] [age]       [age] Patient ID: [id number] Height: 60.0 in. Weight: 212.8 lbs. Measured: 05/08/2018 10:37:42 AM (16 SP 2) Gender: Female Ethnicity: Black Analyzed: 05/08/2018 10:47:34 AM (16 SP 2) FRAX* 10-year Probability of Fracture Based on femoral neck BMD: DualFemur (Left) Major Osteoporotic Fracture: 5.3% Hip Fracture:                1.3% Population:                  [place] (Black) Risk Factors:                None *FRAX is a Materials engineer of the [employer] for Metabolic Bone Disease, a [place] (WHO) [facility]. ASSESSMENT: The probability of a major osteoporotic fracture is 5.3% within the next ten years. The probability of a hip fracture is 1.3% within the next ten years. Electronically Signed   By: Lowella Grip III M.D.   On: 05/08/2018 10:23     Assessment & Plan:  Plan    Meds ordered this encounter  Medications  . metFORMIN  (GLUCOPHAGE) 500 MG tablet    Sig: Take 1 tablet (500 mg total) by mouth daily with breakfast.    Dispense:  90 tablet    Refill:  1  . meloxicam (MOBIC) 7.5 MG tablet    Sig: Take  1 tablet (7.5 mg total) by mouth daily.    Dispense:  30 tablet    Refill:  3  . hydrochlorothiazide (HYDRODIURIL) 25 MG tablet    Sig: Take 1 tablet (25 mg total) by mouth daily.    Dispense:  90 tablet    Refill:  3  . amLODipine (NORVASC) 5 MG tablet    Sig: TAKE 1 TABLET(5 MG) BY MOUTH DAILY    Dispense:  90 tablet    Refill:  1    Problem List Items Addressed This Visit      Unprioritized   DM (diabetes mellitus) type II uncontrolled, periph vascular disorder ([place])    hgba1c to be checked, minimize simple carbs. Increase exercise as tolerated. Continue current meds       Relevant Medications   metFORMIN (GLUCOPHAGE) 500 MG tablet   hydrochlorothiazide (HYDRODIURIL) 25 MG tablet   amLODipine (NORVASC) 5 MG tablet   Essential hypertension    Poorly controlled will alter medications, encouraged DASH diet, minimize caffeine and obtain adequate sleep. Report concerning symptoms and follow up as directed and as needed Take hctz daily F/u 2-3 weeks       Relevant Medications   hydrochlorothiazide (HYDRODIURIL) 25 MG tablet   amLODipine (NORVASC) 5 MG tablet   Other Relevant Orders   Lipid panel   Hemoglobin A1c   Comprehensive metabolic panel   Microalbumin / creatinine urine ratio   Hyperlipidemia    Encouraged heart healthy diet, increase exercise, avoid trans fats, consider a krill oil cap daily      Relevant Medications   hydrochlorothiazide (HYDRODIURIL) 25 MG tablet   amLODipine (NORVASC) 5 MG tablet   Hyperlipidemia associated with type 2 diabetes mellitus (HCC)    Encouraged heart healthy diet, increase exercise, avoid trans fats, consider a krill oil cap daily      Relevant Medications   metFORMIN (GLUCOPHAGE) 500 MG tablet   Other Relevant Orders   Lipid panel    Comprehensive metabolic panel   Lower extremity edema    con't hctz      Relevant Medications   hydrochlorothiazide (HYDRODIURIL) 25 MG tablet   Uncontrolled type 2 diabetes mellitus with hyperglycemia ([place]) - Primary    hgba1c to be checked, minimize simple carbs. Increase exercise as tolerated. Continue current meds       Relevant Medications   metFORMIN (GLUCOPHAGE) 500 MG tablet   Other Relevant Orders   Lipid panel   Hemoglobin A1c   Comprehensive metabolic panel   Microalbumin / creatinine urine ratio    Other Visit Diagnoses    Type 2 diabetes mellitus with diabetic neuropathy, without long-term current use of insulin (HCC)       Relevant Medications   metFORMIN (GLUCOPHAGE) 500 MG tablet   Other Relevant Orders   Ambulatory referral to Podiatry   Primary osteoarthritis of other site       Relevant Medications   meloxicam (MOBIC) 7.5 MG tablet      Follow-up: Return in about 6 months (around 11/27/2020), or if symptoms worsen or fail to improve, for annual exam, fasting.   I,Alexis Bryant,acting as a Education administrator for [employer], DO.,have documented all relevant documentation on the behalf of Ann Held, DO,as directed by  Ann Held, DO while in the presence of [place], DO, have reviewed all documentation for this visit. The documentation on 05/27/20 for the exam, diagnosis, procedures, and orders  are all accurate and complete.

## 2020-06-03 ENCOUNTER — Telehealth: Payer: Self-pay | Admitting: Family Medicine

## 2020-06-03 ENCOUNTER — Other Ambulatory Visit: Payer: Self-pay | Admitting: Family Medicine

## 2020-06-03 ENCOUNTER — Encounter: Payer: Self-pay | Admitting: Family Medicine

## 2020-06-03 DIAGNOSIS — Z789 Other specified health status: Secondary | ICD-10-CM | POA: Insufficient documentation

## 2020-06-03 DIAGNOSIS — E1165 Type 2 diabetes mellitus with hyperglycemia: Secondary | ICD-10-CM

## 2020-06-03 DIAGNOSIS — T466X5A Adverse effect of antihyperlipidemic and antiarteriosclerotic drugs, initial encounter: Secondary | ICD-10-CM | POA: Insufficient documentation

## 2020-06-03 DIAGNOSIS — E785 Hyperlipidemia, unspecified: Secondary | ICD-10-CM

## 2020-06-03 DIAGNOSIS — M791 Myalgia, unspecified site: Secondary | ICD-10-CM | POA: Insufficient documentation

## 2020-06-03 DIAGNOSIS — E1169 Type 2 diabetes mellitus with other specified complication: Secondary | ICD-10-CM

## 2020-06-03 NOTE — Telephone Encounter (Signed)
Labs not reviewed yet. Will print once you've had a chance to look over them.

## 2020-06-03 NOTE — Telephone Encounter (Signed)
Patient would like a copy of her recent lab results mailed to her home.

## 2020-06-03 NOTE — Telephone Encounter (Signed)
Pt says she has tried multiple statins and they all caused Myalgias. After looking in her chart I found that she was on; Lipitor 20 mg, Pravastatin 20 mg and 40 mg, Crestor 5 mg, and Simvastatin 20 mg and 40 mg. She also could not tolerate Zetia 10 mg, Fenofibrate 160 mg, or Livalo 2 mg.   She says as far as the Metformin goes: the XR version caused a rash, so she started taking it every other day and could tolerate it. But now that she is on Metformin 500 mg the regular tabs it is causing GI upset and fatigue. Out of the two shed rather take the XR version and deal with the possibility of the rash than what she has been dealing with on this version. She also inquires about Jardiance because she has been seeing it advertised on Television.

## 2020-06-03 NOTE — Telephone Encounter (Signed)
Refer to lipid clinic for cholesterol We can try jardiance 10 mg daily but it is normally a high copay  #30  2 refills

## 2020-06-03 NOTE — Telephone Encounter (Signed)
See labs 

## 2020-06-03 NOTE — Telephone Encounter (Signed)
Will send once reviewed by [place] 

## 2020-06-06 MED ORDER — EMPAGLIFLOZIN 10 MG PO TABS: 10.0000 mg | ORAL_TABLET | Freq: Every day | ORAL | 2 refills | Status: DC

## 2020-06-06 NOTE — Telephone Encounter (Signed)
Spoke with patient. Pt states trying the lipid clinic before and could not afford the injection. Pt states she will try the Jardiance if it is covered under her insurance.

## 2020-06-13 ENCOUNTER — Ambulatory Visit: Payer: Medicare Other | Admitting: Podiatry

## 2020-06-13 ENCOUNTER — Other Ambulatory Visit: Payer: Self-pay

## 2020-06-13 DIAGNOSIS — M79675 Pain in left toe(s): Secondary | ICD-10-CM

## 2020-06-13 DIAGNOSIS — M79674 Pain in right toe(s): Secondary | ICD-10-CM | POA: Diagnosis not present

## 2020-06-13 DIAGNOSIS — B351 Tinea unguium: Secondary | ICD-10-CM | POA: Diagnosis not present

## 2020-06-13 DIAGNOSIS — E1149 Type 2 diabetes mellitus with other diabetic neurological complication: Secondary | ICD-10-CM

## 2020-06-13 MED ORDER — DULOXETINE HCL 30 MG PO CPEP: 30.0000 mg | ORAL_CAPSULE | Freq: Every day | ORAL | 0 refills | Status: DC

## 2020-06-13 NOTE — Patient Instructions (Signed)
Diabetes Mellitus and Foot Care Foot care is an important part of your health, especially when you have diabetes. Diabetes may cause you to have problems because of poor blood flow (circulation) to your feet and legs, which can cause your skin to:  Become thinner and drier.  Break more easily.  Heal more slowly.  Peel and crack. You may also have nerve damage (neuropathy) in your legs and feet, causing decreased feeling in them. This means that you may not notice minor injuries to your feet that could lead to more serious problems. Noticing and addressing any potential problems early is the best way to prevent future foot problems. How to care for your feet Foot hygiene  Wash your feet daily with warm water and mild soap. Do not use hot water. Then, pat your feet and the areas between your toes until they are completely dry. Do not soak your feet as this can dry your skin.  Trim your toenails straight across. Do not dig under them or around the cuticle. File the edges of your nails with an emery board or nail file.  Apply a moisturizing lotion or petroleum jelly to the skin on your feet and to dry, brittle toenails. Use lotion that does not contain alcohol and is unscented. Do not apply lotion between your toes.   Shoes and socks  Wear clean socks or stockings every day. Make sure they are not too tight. Do not wear knee-high stockings since they may decrease blood flow to your legs.  Wear shoes that fit properly and have enough cushioning. Always look in your shoes before you put them on to be sure there are no objects inside.  To break in new shoes, wear them for just a few hours a day. This prevents injuries on your feet. Wounds, scrapes, corns, and calluses  Check your feet daily for blisters, cuts, bruises, sores, and redness. If you cannot see the bottom of your feet, use a mirror or ask someone for help.  Do not cut corns or calluses or try to remove them with medicine.  If you  find a minor scrape, cut, or break in the skin on your feet, keep it and the skin around it clean and dry. You may clean these areas with mild soap and water. Do not clean the area with peroxide, alcohol, or iodine.  If you have a wound, scrape, corn, or callus on your foot, look at it several times a day to make sure it is healing and not infected. Check for: ? Redness, swelling, or pain. ? Fluid or blood. ? Warmth. ? Pus or a bad smell.   General tips  Do not cross your legs. This may decrease blood flow to your feet.  Do not use heating pads or hot water bottles on your feet. They may burn your skin. If you have lost feeling in your feet or legs, you may not know this is happening until it is too late.  Protect your feet from hot and cold by wearing shoes, such as at the beach or on hot pavement.  Schedule a complete foot exam at least once a year (annually) or more often if you have foot problems. Report any cuts, sores, or bruises to your health care provider immediately. Where to find more information  American Diabetes Association: www.diabetes.org  Association of Diabetes Care & Education Specialists: www.diabeteseducator.org Contact a health care provider if:  You have a medical condition that increases your risk of infection and   you have any cuts, sores, or bruises on your feet.  You have an injury that is not healing.  You have redness on your legs or feet.  You feel burning or tingling in your legs or feet.  You have pain or cramps in your legs and feet.  Your legs or feet are numb.  Your feet always feel cold.  You have pain around any toenails. Get help right away if:  You have a wound, scrape, corn, or callus on your foot and: ? You have pain, swelling, or redness that gets worse. ? You have fluid or blood coming from the wound, scrape, corn, or callus. ? Your wound, scrape, corn, or callus feels warm to the touch. ? You have pus or a bad smell coming from  the wound, scrape, corn, or callus. ? You have a fever. ? You have a red line going up your leg. Summary  Check your feet every day for blisters, cuts, bruises, sores, and redness.  Apply a moisturizing lotion or petroleum jelly to the skin on your feet and to dry, brittle toenails.  Wear shoes that fit properly and have enough cushioning.  If you have foot problems, report any cuts, sores, or bruises to your health care provider immediately.  Schedule a complete foot exam at least once a year (annually) or more often if you have foot problems. This information is not intended to replace advice given to you by your health care provider. Make sure you discuss any questions you have with your health care provider. Document Revised: 09/24/2019 Document Reviewed: 09/24/2019 Elsevier Patient Education  2021 Elsevier Inc.  

## 2020-06-13 NOTE — Progress Notes (Signed)
Subjective:   Patient ID: Felicia Martin, female   DOB: 87 y.o.   MRN: [id number]   HPI 87 year old female presents the office today for diabetic foot evaluation as well as for thick, elongated toes that she cannot trim her self.  She also gets neuropathy symptoms at nighttime she points that she gets numbness and tingling going to her knees.  She previously was on gabapentin and Lyrica which had side effects.  It was helpful however.  Denies any ulcerations.  No other concerns.  Last A1c was 7.3  Review of Systems  All other systems reviewed and are negative.  Past Medical History:  Diagnosis Date  . Asthma   . GERD (gastroesophageal reflux disease)   . Hypercholesterolemia   . Hypertension   . Pulmonary fibrosis ([place])   . Sarcoidosis   . Surgical history of tubal ligation 1960s    Past Surgical History:  Procedure Laterality Date  . COLONOSCOPY  12/09/14  . ESOPHAGOGASTRODUODENOSCOPY  12/09/14  . LUNG SURGERY  1960   Right lung  . Maloney dilation  12/09/14  . TUBAL LIGATION  1960     Current Outpatient Medications:  .  DULoxetine (CYMBALTA) 30 MG capsule, Take 1 capsule (30 mg total) by mouth daily., Disp: 90 capsule, Rfl: 0 .  albuterol (VENTOLIN HFA) 108 (90 Base) MCG/ACT inhaler, Inhale 2 puffs into the lungs every 6 (six) hours as needed., Disp: 18 g, Rfl: 2 .  amLODipine (NORVASC) 5 MG tablet, TAKE 1 TABLET(5 MG) BY MOUTH DAILY, Disp: 90 tablet, Rfl: 1 .  Blood Glucose Monitoring Suppl (ONETOUCH VERIO) w/Device KIT, Use as directed once a day.  Dx code: E11.9, Disp: 1 kit, Rfl: 0 .  empagliflozin (JARDIANCE) 10 MG TABS tablet, Take 1 tablet (10 mg total) by mouth daily before breakfast., Disp: 30 tablet, Rfl: 2 .  glucose blood (ONETOUCH VERIO) test strip, Use as instructed once a day.  Dx Code: E11.9, Disp: 100 each, Rfl: 1 .  hydrochlorothiazide (HYDRODIURIL) 25 MG tablet, Take 1 tablet (25 mg total) by mouth daily., Disp: 90 tablet, Rfl: 3 .  Lancets (ONETOUCH  DELICA PLUS [id number]) MISC, USE ONCE A DAY, Disp: 100 each, Rfl: 1 .  meloxicam (MOBIC) 15 MG tablet, TAKE 1 TABLET BY MOUTH EVERY DAY AS NEEDED FOR PAIN, Disp: 30 tablet, Rfl: 2 .  meloxicam (MOBIC) 7.5 MG tablet, Take 1 tablet (7.5 mg total) by mouth daily., Disp: 30 tablet, Rfl: 3 .  metFORMIN (GLUCOPHAGE) 500 MG tablet, Take 1 tablet (500 mg total) by mouth daily with breakfast., Disp: 90 tablet, Rfl: 1 .  ofloxacin (FLOXIN OTIC) 0.3 % OTIC solution, Place 10 drops into both ears daily., Disp: 10 mL, Rfl: 1  Allergies  Allergen Reactions  . Statins Other (See Comments)    Muscle aches and weakness  . Zetia [Ezetimibe] Other (See Comments)    Muscle pain  . Fenofibrate Rash        Objective:  Physical Exam  General: AAO x3, NAD  Dermatological: Nails are hypertrophic, dystrophic, brittle, discolored, elongated 10. No surrounding redness or drainage. Tenderness nails 1-5 bilaterally. No open lesions or pre-ulcerative lesions are identified today.  Vascular: Dorsalis Pedis artery and Posterior Tibial artery pedal pulses are 2/4 bilateral with immedate capillary fill time. There is no pain with calf compression, swelling, warmth, erythema.   Neruologic: Sensation mildly decreased with Semmes Weinstein monofilament.  Musculoskeletal: Hammertoes present.  Muscular strength 5/5 in all groups tested bilateral.  Assessment:   Symptomatic onychomycosis, neuropathy     Plan:  -Treatment options discussed including all alternatives, risks, and complications -Etiology of symptoms were discussed -Nails debrided 10 without complications or bleeding. -We will try Cymbalta for neuropathy.  Discussed side effects. -Daily foot inspection -Follow-up in 3 months or sooner if any problems arise. In the meantime, encouraged to call the office with any questions, concerns, change in symptoms.   Celesta Gentile, DPM

## 2020-07-13 ENCOUNTER — Other Ambulatory Visit: Payer: Self-pay

## 2020-07-13 MED ORDER — ONETOUCH VERIO VI STRP: ORAL_STRIP | 1 refills | Status: DC

## 2020-08-12 ENCOUNTER — Telehealth: Payer: Self-pay | Admitting: Pharmacist

## 2020-08-12 NOTE — Chronic Care Management (AMB) (Signed)
Chronic Care Management Pharmacy Assistant   Name: Felicia Martin  MRN: [id number] DOB: 01-06-1933   Reason for Encounter: Disease State Diabetes Mellitus     Recent office visits:  05/27/2020 Roma Schanz, DO (PCP) General follow up. Referral to podiatry. Labs ordered. Follow up in 6 months.  Recent consult visits:  06/13/2020 Celesta Gentile, DPM (Podiatry) Diabetic foot evaluation. Started on Cymbalta 30 mg. Follow up in 3 months.  Hospital visits:  None in previous 6 months  Medications: Outpatient Encounter Medications as of 08/12/2020  Medication Sig  . albuterol (VENTOLIN HFA) 108 (90 Base) MCG/ACT inhaler Inhale 2 puffs into the lungs every 6 (six) hours as needed.  Marland Kitchen amLODipine (NORVASC) 5 MG tablet TAKE 1 TABLET(5 MG) BY MOUTH DAILY  . Blood Glucose Monitoring Suppl (ONETOUCH VERIO) w/Device KIT Use as directed once a day.  Dx code: E11.9  . DULoxetine (CYMBALTA) 30 MG capsule Take 1 capsule (30 mg total) by mouth daily.  . empagliflozin (JARDIANCE) 10 MG TABS tablet Take 1 tablet (10 mg total) by mouth daily before breakfast.  . glucose blood (ONETOUCH VERIO) test strip Use as instructed once a day.  Dx Code: E11.9  . hydrochlorothiazide (HYDRODIURIL) 25 MG tablet Take 1 tablet (25 mg total) by mouth daily.  . Lancets (ONETOUCH DELICA PLUS [id number]) MISC USE ONCE A DAY  . meloxicam (MOBIC) 15 MG tablet TAKE 1 TABLET BY MOUTH EVERY DAY AS NEEDED FOR PAIN  . meloxicam (MOBIC) 7.5 MG tablet Take 1 tablet (7.5 mg total) by mouth daily.  . metFORMIN (GLUCOPHAGE) 500 MG tablet Take 1 tablet (500 mg total) by mouth daily with breakfast.  . ofloxacin (FLOXIN OTIC) 0.3 % OTIC solution Place 10 drops into both ears daily.   No facility-administered encounter medications on file as of 08/12/2020.   Recent Relevant Labs: Lab Results  Component Value Date/Time   HGBA1C 7.3 (H) 05/27/2020 09:20 AM   HGBA1C 6.6 (H) 10/05/2019 11:13 AM   MICROALBUR 1.2 05/27/2020  09:20 AM   MICROALBUR 1.0 10/05/2019 11:13 AM    Kidney Function Lab Results  Component Value Date/Time   CREATININE 0.84 05/27/2020 09:20 AM   CREATININE 0.81 10/05/2019 11:13 AM   GFR 63.40 05/27/2020 09:20 AM   GFRNONAA 82.80 09/29/2009 03:47 PM   GFRAA 91 08/18/2007 09:14 AM    . Current antihyperglycemic regimen:  o Jardiance 10 mg take 1 tab daily o Metformin 500 mg take 1 tab daily  . What recent interventions/DTPs have been made to improve glycemic control: None noted o  . Have there been any recent hospitalizations or ED visits since last visit with CPP? No   . Patient denies hypoglycemic symptoms, including Pale, Sweaty, Shaky, Hungry, Nervous/irritable and Vision changes   . Patient denies hyperglycemic symptoms, including blurry vision, excessive thirst, fatigue, polyuria and weakness   . How often are you checking your blood sugar? once daily   . What are your blood sugars ranging?  o Fasting:  o Before meals:  o After meals: 109-135 o Bedtime:  . During the week, how often does your blood glucose drop below 70? Never   . Are you checking your feet daily/regularly?   Patient states she does check her feet daily  Scheduled patient with CPP phone appointment on 09/01/2020 at 2pm  Adherence Review: Is the patient currently on a STATIN medication? No Is the patient currently on ACE/ARB medication? No Does the patient have >5 day gap between last estimated  fill dates? Yes   Star Rating Drugs: Jardiance 10 mg Last filled:06/06/2020 30 DS Metformin 500 mg Last filled:05/27/2020 90 DS  [facility] Clinical Pharmacist Assistant [phone]

## 2020-08-17 ENCOUNTER — Ambulatory Visit: Payer: Medicare Other | Admitting: Podiatry

## 2020-08-17 ENCOUNTER — Other Ambulatory Visit: Payer: Self-pay

## 2020-08-17 ENCOUNTER — Encounter: Payer: Self-pay | Admitting: Podiatry

## 2020-08-17 DIAGNOSIS — B351 Tinea unguium: Secondary | ICD-10-CM

## 2020-08-17 DIAGNOSIS — E1149 Type 2 diabetes mellitus with other diabetic neurological complication: Secondary | ICD-10-CM

## 2020-08-17 DIAGNOSIS — L608 Other nail disorders: Secondary | ICD-10-CM | POA: Insufficient documentation

## 2020-08-17 DIAGNOSIS — M79674 Pain in right toe(s): Secondary | ICD-10-CM

## 2020-08-17 DIAGNOSIS — M79675 Pain in left toe(s): Secondary | ICD-10-CM

## 2020-08-17 NOTE — Progress Notes (Signed)
This patient returns to my office for at risk foot care.  This patient requires this care by a professional since this patient will be at risk due to having type 2 diabetes.  This patient is unable to cut nails herself since the patient cannot reach her nails.These nails are painful walking and wearing shoes.  This patient presents for at risk foot care today.  General Appearance  Alert, conversant and in no acute stress.  Vascular  Dorsalis pedis pulses are palpable  bilaterally. Posterior tibial pulses are weakly palpable  B/L. Capillary return is within normal limits  bilaterally. Temperature is within normal limits  bilaterally.  Neurologic  Senn-Weinstein monofilament wire test diminished   bilaterally. Muscle power within normal limits bilaterally.  Nails Thick disfigured discolored nails with subungual debris  from hallux to fifth toes bilaterally. No evidence of bacterial infection or drainage bilaterally.  Pincer nails  B/L.  Orthopedic  No limitations of motion  feet .  No crepitus or effusions noted.  No bony pathology .  Hammer toes  B/L.  Skin  normotropic skin with no porokeratosis noted bilaterally.  No signs of infections or ulcers noted.     Onychomycosis  Pain in right toes  Pain in left toes  Consent was obtained for treatment procedures.   Mechanical debridement of nails 1-5  bilaterally performed with a nail nipper.  Filed with dremel without incident.    Return office visit    3 months                  Told patient to return for periodic foot care and evaluation due to potential at risk complications.   Gardiner Barefoot DPM

## 2020-09-01 ENCOUNTER — Ambulatory Visit (INDEPENDENT_AMBULATORY_CARE_PROVIDER_SITE_OTHER): Payer: Medicare Other | Admitting: Pharmacist

## 2020-09-01 DIAGNOSIS — M949 Disorder of cartilage, unspecified: Secondary | ICD-10-CM

## 2020-09-01 DIAGNOSIS — E785 Hyperlipidemia, unspecified: Secondary | ICD-10-CM | POA: Diagnosis not present

## 2020-09-01 DIAGNOSIS — I10 Essential (primary) hypertension: Secondary | ICD-10-CM

## 2020-09-01 DIAGNOSIS — E1165 Type 2 diabetes mellitus with hyperglycemia: Secondary | ICD-10-CM

## 2020-09-01 DIAGNOSIS — E1169 Type 2 diabetes mellitus with other specified complication: Secondary | ICD-10-CM | POA: Diagnosis not present

## 2020-09-01 DIAGNOSIS — J4520 Mild intermittent asthma, uncomplicated: Secondary | ICD-10-CM | POA: Diagnosis not present

## 2020-09-02 NOTE — Chronic Care Management (AMB) (Signed)
Chronic Care Management Pharmacy Note  09/05/2020 Name:  Felicia Martin MRN:  [id number] DOB:  Jul 04, 1932  Summary: Patient did not start Jardiance due to cost  Recommendations/Changes made from today's visit: Will work with patient to complete application for assistance with Jardiance (no sure that she will qualify) In future if A1c still elevated could consider glimepiride 1 to2 mg in am or pioglitazone 15 to 30 mg daily)   Subjective: Felicia Martin is an 87 y.o. year old female who is a primary patient of Ann Held, DO.  The CCM team was consulted for assistance with disease management and care coordination needs.    Engaged with patient by telephone for follow up visit in response to provider referral for pharmacy case management and/or care coordination services.   Consent to Services:  The patient was given information about Chronic Care Management services, agreed to services, and gave verbal consent prior to initiation of services.  Please see initial visit note for detailed documentation.   Patient Care Team: Carollee Herter, Alferd Apa, DO as PCP - General (Family Medicine) Inda Castle, MD (Inactive) as Consulting Physician (Gastroenterology) Cherre Robins, PharmD (Pharmacist)  Recent office visits: 05/27/2020 Roma Schanz, DO (PCP) General follow up. Referral to podiatry. Labs ordered. Follow up in 6 months   Recent consult visits: 08/17/2020 - Podiatry (Dr Prudence Davidson) At risk foot care; Mechanical debridement of toenails on both feet.  06/13/2020 Celesta Gentile, DPM (Podiatry) Diabetic foot evaluation. Started  Cymbalta 30 mg. Follow up in 3 months.  Hospital visits: None in previous 6 months  Objective:  Lab Results  Component Value Date   CREATININE 0.84 05/27/2020   CREATININE 0.81 10/05/2019   CREATININE 0.95 07/09/2019    Lab Results  Component Value Date   HGBA1C 7.3 (H) 05/27/2020   Last diabetic Eye exam: No results found  for: HMDIABEYEEXA  Last diabetic Foot exam: No results found for: HMDIABFOOTEX      Component Value Date/Time   CHOL 282 (H) 05/27/2020 0920   TRIG 125.0 05/27/2020 0920   HDL 55.30 05/27/2020 0920   CHOLHDL 5 05/27/2020 0920   VLDL 25.0 05/27/2020 0920   LDLCALC 202 (H) 05/27/2020 0920   LDLDIRECT 230.7 03/18/2013 0805    Hepatic Function Latest Ref Rng & Units 05/27/2020 10/05/2019 07/09/2019  Total Protein 6.0 - 8.3 g/dL 8.0 7.9 7.9  Albumin 3.5 - 5.2 g/dL 3.9 4.2 4.2  AST 0 - 37 U/L _0 ALT 0 - 35 U/L _1 Alk Phosphatase 39 - 117 U/L 65 63 58  Total Bilirubin 0.2 - 1.2 mg/dL 0.4 0.4 0.5  Bilirubin, Direct 0.0 - 0.3 mg/dL - - -    Lab Results  Component Value Date/Time   TSH 2.19 09/29/2009 03:47 PM   TSH 1.71 08/18/2007 09:14 AM    CBC Latest Ref Rng & Units 12/16/2013 08/06/2012 05/28/2011  WBC 4.0 - 10.5 K/uL 4.4 5.3 4.6  Hemoglobin 12.0 - 15.0 g/dL 12.2 12.5 12.0  Hematocrit 36.0 - 46.0 % 36.7 37.2 35.9(L)  Platelets 150.0 - 400.0 K/uL 279.0 326.0 271.0    Lab Results  Component Value Date/Time   VD25OH 39 09/29/2009 10:48 PM   VD25OH 27 (L) 09/28/2008 09:02 PM    Clinical ASCVD: No  The ASCVD Risk score Mikey Bussing DC Jr., et al., 2013) failed to calculate for the following reasons:   The 2013 ASCVD risk score is only valid for ages 3  to 44     Social History   Tobacco Use  Smoking Status Former   Packs/day: 0.50   Years: 40.00   Pack years: 20.00   Types: Cigarettes   Quit date: 05/25/1999   Years since quitting: 21.2  Smokeless Tobacco Never   BP Readings from Last 3 Encounters:  05/27/20 (!) 150/96  10/05/19 140/78  09/11/19 138/71   Pulse Readings from Last 3 Encounters:  05/27/20 78  10/05/19 71  07/09/19 88   Wt Readings from Last 3 Encounters:  05/27/20 216 lb 6.4 oz (98.2 kg)  10/05/19 211 lb 3.2 oz (95.8 kg)  09/11/19 214 lb 9.6 oz (97.3 kg)    Assessment: Review of patient past medical history, allergies, medications, health  status, including review of consultants reports, laboratory and other test data, was performed as part of comprehensive evaluation and provision of chronic care management services.   SDOH:  (Social Determinants of Health) assessments and interventions performed:  SDOH Interventions    Flowsheet Row Most Recent Value  SDOH Interventions   Financial Strain Interventions Other (Comment)  [Assessing for manufacturer PAP or alternative treatments that are lower cost.]  Transportation Interventions Intervention Not Indicated       CCM Care Plan  Allergies  Allergen Reactions   Fenofibrate Rash   Statins Other (See Comments)    Muscle aches and weakness   Zetia [Ezetimibe] Other (See Comments)    Muscle pain    Medications Reviewed Today     Reviewed by Cherre Robins, PharmD (Pharmacist) on 09/01/20 at 1416  Med List Status: <None>   Medication Order Taking? Sig Documenting Provider Last Dose Status Informant  albuterol (VENTOLIN HFA) 108 (90 Base) MCG/ACT inhaler [id number] Yes Inhale 2 puffs into the lungs every 6 (six) hours as needed. Carollee Herter, Kendrick Fries R, DO Taking Active   amLODipine (NORVASC) 5 MG tablet [id number] Yes TAKE 1 TABLET(5 MG) BY MOUTH DAILY Roma Schanz R, DO Taking Active   Blood Glucose Monitoring Suppl (ONETOUCH VERIO) w/Device KIT [id number] Yes Use as directed once a day.  Dx code: E11.9 Ann Held, DO Taking Active   DULoxetine (CYMBALTA) 30 MG capsule [id number] No Take 1 capsule (30 mg total) by mouth daily.  Patient not taking: Reported on 09/01/2020   Trula Slade, DPM Not Taking Consider Medication Status and Discontinue   empagliflozin (JARDIANCE) 10 MG TABS tablet [id number] No Take 1 tablet (10 mg total) by mouth daily before breakfast.  Patient not taking: Reported on 09/01/2020   Ann Held, DO Not Taking Active   glucose blood (ONETOUCH VERIO) test strip [id number] Yes Use as instructed once a day.  Dx Code: E11.9 Ann Held, DO Taking Active   hydrochlorothiazide (HYDRODIURIL) 25 MG tablet [id number] Yes Take 1 tablet (25 mg total) by mouth daily. Ann Held, DO Taking Active   Lancets (ONETOUCH DELICA PLUS [id number]) [place] [id number] Yes USE ONCE A DAY Ann Held, DO Taking Active   meloxicam (MOBIC) 15 MG tablet [id number] No TAKE 1 TABLET BY MOUTH EVERY DAY AS NEEDED FOR PAIN  Patient not taking: Reported on 09/01/2020   Mosie Lukes, MD Not Taking Consider Medication Status and Discontinue   meloxicam (MOBIC) 7.5 MG tablet [id number] Yes Take 1 tablet (7.5 mg total) by mouth daily. Roma Schanz R, DO Taking Active   metFORMIN (GLUCOPHAGE) 500 MG tablet [id number] Yes Take 1 tablet (500 mg total) by mouth daily  with breakfast. Carollee Herter, Alferd Apa, DO Taking Active   ofloxacin (FLOXIN OTIC) 0.3 % OTIC solution [id number] Yes Place 10 drops into both ears daily. Ann Held, DO Taking Active             Patient Active Problem List   Diagnosis Date Noted   Pincer nail deformity 08/17/2020   Statin intolerance 06/03/2020   DDD (degenerative disc disease), thoracolumbar 10/06/2019   Uncontrolled type 2 diabetes mellitus with hyperglycemia ([place]) 10/06/2019   Hyperlipidemia associated with type 2 diabetes mellitus ([place]) 10/06/2019   Cerumen impaction 09/21/2018   Impacted cerumen of both ears 09/16/2018   Lower extremity edema 07/24/2018   DM (diabetes mellitus) type II uncontrolled, periph vascular disorder ([place]) 06/02/2018   Acute left-sided low back pain without sciatica 05/08/2018   Hyperlipidemia 01/29/2017   Colon cancer screening 10/21/2014   Dysuria 03/17/2014   Essential hypertension 03/05/2014   Routine general medical examination at a health care facility 12/07/2013   Elevated blood pressure 12/07/2013   Frequent urination at night 12/07/2013   Nonspecific abnormal electrocardiogram (ECG) (EKG) 12/07/2013   CANDIDIASIS, VULVAR 05/12/2010    UTI 05/04/2010   DEGENERATIVE JOINT DISEASE 09/22/2008   Dysphagia 09/22/2008   OSTEOPENIA 11/05/2007   TELANGIECTASIA 05/29/2007   HYPERCHOLESTEROLEMIA 02/01/2007   PULMONARY FIBROSIS 02/01/2007   SARCOIDOSIS 11/20/2006   Asthma 11/20/2006    Immunization History  Administered Date(s) Administered   PFIZER(Purple Top)SARS-COV-2 Vaccination 06/01/2019, 06/23/2019   Td 03/19/2002    Conditions to be addressed/monitored: HTN, HLD, DMII, and osteopenia; neuropathy; asthma  Care Plan : General Pharmacy (Adult)  Updates made by Cherre Robins, PHARMD since 09/05/2020 12:00 AM     Problem: Pharmacy Care Plan - Medication and Chronic Care Management   Priority: High  Onset Date: 09/01/2020     Long-Range Goal: Patient-Specific Goal   Start Date: 09/01/2020  This Visit's Progress: Not on track  Priority: High  Note:   Current Barriers:  Unable to independently afford treatment regimen Unable to achieve control of Hyperlipidemia  Unable to maintain control of type 2 DM and HTN Does not adhere to prescribed medication regimen  Pharmacist Clinical Goal(s):  Over the next 120 days, patient will verbalize ability to afford treatment regimen achieve control of hyperlipidemia as evidenced by LDL <100 maintain control of HTN and type 2 DM as evidenced by BP <140/90 and A1c < 7.0%  adhere to prescribed medication regimen as evidenced by fill history and attainment of goals for chronic conditions  through collaboration with PharmD and provider.   Interventions: 1:1 collaboration with Carollee Herter, Alferd Apa, DO regarding development and update of comprehensive plan of care as evidenced by provider attestation and co-signature Inter-disciplinary care team collaboration (see longitudinal plan of care) Comprehensive medication review performed; medication list updated in electronic medical record     Hypertension Uncontrolled; BP goal <140/90 Office BP not at goal Home BP readings:  147/80; 157/80; 118/60 and 128/70 Denies symptoms of hypotension - no dizziness  Current regimen:  Amlodipine 5 mg daily  Hydrochlorothiazide 25 mg daily Interventions: Reviewed BP goals Continue current regimen for HTN Continue to check BP daily; document, and provide at future appointments  Hyperlipidemia Not at goal; LDL goal <100, Total Cholesterol (CHOL) goal <200 (since unable to take statins - prevention of upward trend in lipids also a goal) Patient is intolerant to statin medications - tried Livalo, atorvastatin; pravastatin, rosuvastatin and simvastatin - all eventually caused myalgias Also unable to tolerate ezetimibe  or fenofibrate - also caused myalgias Repatha was not started due to cost (currently no funding for [employer] that helps with cost) Current regimen:  None currently Interventions: Discussed importance of LDL goal Checked to see if Nexletol would be an option but it does not have a patient assistance program and cost would be too much for patient.  Dicussed limiting intake of saturated fat and cholesterol in diet.    Diabetes Lab Results  Component Value Date/Time   HGBA1C 7.3 (H) 05/27/2020 09:20 AM   HGBA1C 6.6 (H) 10/05/2019 11:13 AM  Not at goal;  A1c goal <7% Patient tried metformin 525m bid but caused nausea; She also tried metformin XR but states this caused her to breakout Recent home BG readings:  Ranges from 105 to 145 Current regimen:  Metformin 500 mg daily Jardiance 156mdaily (not taking due to cost)  Interventions: Discussed the importance of limiting carbohydrates (45 grams per meal) Checked insurance plan. Has a $95 deductible to meet for branded medications, so first fill of Jardiance would be $95 + $47 = $142. Then cost would be $47 per month until reaches coverage gap. She is agreeable to $47 but cannot afford the deductible. Will see if she might be able to enroll in manufacturer's assistance program but often patient will  no be approved for only deductible cost issues.  Recommended she continue to check blood sugar 3 to 4 times per week document, and provide at future appointments Contact provider with any episodes of hypoglycemia Future options if unable to get assistance with Jardiance are: low dose pioglitazone - 15 - 3038maily; glimepiride 1 to 2 mg daily or try to get assistance with [place] and [place] program for Farxiga.  Asthma Controlled;  goal: reduce symptoms of asthma and prevent exacerbations No recent exacerbations Has not been taking maintenance inhaler for several months Current regimen:  Albuterol HFA 108 mcg 2 puffs every 6 hours as needed  Patient self care activities - Over the next 90 days, patient will: Continue current therapy for asthma control Contact provider is using albuterol more than twice per week. May need to restart Flovent inhaler  Chronic Pain/Neuropathy Controlled; goals:  reduce symptoms of pain/neuropathy Per patient neuropath improved on it own without starting duloxetine Current regimen:  Duloxetine 7m63mily (patient never started)  Interventions: Discussed duloxetine and neuropathy - OK to not take if she is not having pain Continue to follow up with Dr WagoJacqualyn Posey primary care provider; Contact provider if neuropathy returns or worsens to discuss treatment.   Medication management Current pharmacy: [facility] Interventions Comprehensive medication review performed. Continue current medication management strategy Working with patient on cost solutions for diabetes and hyperlipidemia medications  Patient Goals/Self-Care Activities Over the next 120 days, patient will:  take medications as prescribed, check glucose 3 to 4 times per week, document, and provide at future appointments, check blood pressure daily, document, and provide at future appointments, and engage in dietary modifications by limiting intake of sugar / carbohydrates and saturated fat  Follow Up Plan:   Pharmacy team will follow up in 1 month to check BG and on patient assistance application if not returned sooner . Clinical pharmacist will follow up in 3 months.       Medication Assistance:  Starting process for applying for Jardiance patient assistance.   [facility]  [place] [address] Phone: [phone] Fax: [phone]   Follow Up:  Patient agrees to Care Plan and Follow-up.  Plan:  pharmacy team will check in 1 month regarding BG and patient assistance. F/U with clinical pharmacist in 3 months  Cherre Robins, PharmD Clinical Pharmacist [facility] [phone]

## 2020-09-05 NOTE — Patient Instructions (Signed)
Visit Information Felicia Martin It was a pleasure speaking with you today. Please feel free to contact me if you have any questions or concerns. Below is information regarding your health goals.   I recommend you contact [facility] regarding over the counter medication benefits at [phone]. You could get $50 per quarter to use for calcium and vitamin D supplements and other non prescription medications.  I have also included a patient assistance application to see of you might be able to get Jardiance form the manufacturer for free. Please complete the patient portions and return to me at Dr Nonda Lou office.   Cherre Robins, PharmD Clinical Pharmacist [facility] [phone]  PATIENT GOALS:  Goals Addressed             This Visit's Progress    [place]   Not on track    [place] (see longitudinal plan of care for additional care plan information)  Current Barriers:  Chronic Disease Management support, education, and care coordination needs related to Asthma, Diabetes, Hypertension, Hyperlipidemia, Chronic Pain, Osteopenia   Hypertension BP Readings from Last 3 Encounters:  05/27/20 (!) 150/96  10/05/19 140/78  09/11/19 138/71  Pharmacist Clinical Goal(s): Over the next 90 days, patient will work with PharmD and providers to achieve BP goal <140/90 Current regimen:  Amlodipine 5 mg daily  Hydrochlorothiazide 25 mg daily Interventions: Requested patient to record blood pressure when checking daily Patient self care activities - Over the next 90 days, patient will: Check blood pressure daily, document, and provide at future appointments Ensure daily salt intake < 2300 mg/day  Hyperlipidemia Lipid Panel     Component Value Date/Time   CHOL 282 (H) 05/27/2020 0920   TRIG 125.0 05/27/2020 0920   HDL 55.30 05/27/2020 0920   CHOLHDL 5 05/27/2020 0920   VLDL 25.0 05/27/2020 0920   LDLCALC  202 (H) 05/27/2020 0920   LDLDIRECT 230.7 03/18/2013 0805  Pharmacist Clinical Goal(s): Over the next 180 days, patient will work with PharmD and providers to achieve LDL goal <100, Total Cholesterol (CHOL) goal <200 or prevent continue upward trend Current regimen:  None currently Interventions: Has not been able to tolerate statin medications in past Discussed importance of LDL goal Patient self care activities - Over the next 180 days, patient will: Reduce cholesterol intake in diet   Diabetes Lab Results  Component Value Date/Time   HGBA1C 7.3 (H) 05/27/2020 09:20 AM   HGBA1C 6.6 (H) 10/05/2019 11:13 AM  Pharmacist Clinical Goal(s): Over the next 90 days, patient will work with PharmD and providers to maintain A1c goal <7% Current regimen:  Metformin XR 500 mg daily Jardiance 10mg  daily (not taking due to cost)  Interventions: Discussed the importance of limiting carbohydrates (45 grams per meal) Check insurance plan. Has a $95 deductible to meet for branded medications, so first fill of Jardiance would be $95 + $47 = $142. Then cost would be $47 per month until reaches coverage gap.  Patient assistance is a possibility.  Patient self care activities - Over the next 90 days, patient will: Check blood sugar 3 to 4 times per week document, and provide at future appointments Contact provider with any episodes of hypoglycemia  Asthma Pharmacist Clinical Goal(s) Over the next 90 days, patient will work with PharmD and providers to reduce symptoms of asthma Current regimen:  Albuterol HFA 108 mcg 2 puffs every 6 hours as needed  Patient self care activities -  Over the next 90 days, patient will: Continue current therapy for asthma control Contact provider if using albuterol more than twice per week. May need to restart Flovent inhaler  Chronic Pain/Neuropathy Pharmacist Clinical Goal(s) Over the next 90 days, patient will work with PharmD and providers to reduce symptoms of  pain/neuropathy Current regimen:  Duloxetine 30mg  daily (patient never started)  Interventions: Discussed duloxetine and neuropathy - patient reported neuropathy has improved without needing to start duloxetine Patient self care activities - Over the next 90 days, patient will: Continue to follow up with Dr Jacqualyn Posey and primary care provider; Contact provider if neuropathy returns or worsens to discuss treatment.   Medication management Pharmacist Clinical Goal(s): Over the next 90 days, patient will work with PharmD and providers to achieve optimal medication adherence Current pharmacy: [facility] Interventions Comprehensive medication review performed. Continue current medication management strategy Patient self care activities - Over the next 90 days, patient will: Focus on medication adherence by filling and taking medications appropriately  Take medications as prescribed Report any questions or concerns to PharmD and/or provider(s) Recommend you contact [facility] regarding over the counter medication benefits at [phone]. You should get $50 per quarter to use for calcium and vitamin D supplements and other non prescription medications.    Please see past updates related to this goal by clicking on the "Past Updates" button in the selected goal           The patient verbalized understanding of instructions, educational materials, and care plan provided today and agreed to receive a mailed copy of patient instructions, educational materials, and care plan.   Follow up call from pharmacist for 3 months

## 2020-09-14 ENCOUNTER — Ambulatory Visit: Payer: Medicare Other | Admitting: Podiatry

## 2020-09-20 ENCOUNTER — Other Ambulatory Visit: Payer: Self-pay

## 2020-09-20 ENCOUNTER — Encounter: Payer: Self-pay | Admitting: Family Medicine

## 2020-09-20 ENCOUNTER — Ambulatory Visit (INDEPENDENT_AMBULATORY_CARE_PROVIDER_SITE_OTHER): Payer: Medicare Other | Admitting: Family Medicine

## 2020-09-20 VITALS — BP 112/86 | HR 83 | Temp 98.6°F | Resp 20 | Ht 60.0 in | Wt 212.6 lb

## 2020-09-20 DIAGNOSIS — E1169 Type 2 diabetes mellitus with other specified complication: Secondary | ICD-10-CM | POA: Diagnosis not present

## 2020-09-20 DIAGNOSIS — E1165 Type 2 diabetes mellitus with hyperglycemia: Secondary | ICD-10-CM | POA: Diagnosis not present

## 2020-09-20 DIAGNOSIS — E1151 Type 2 diabetes mellitus with diabetic peripheral angiopathy without gangrene: Secondary | ICD-10-CM

## 2020-09-20 DIAGNOSIS — E785 Hyperlipidemia, unspecified: Secondary | ICD-10-CM

## 2020-09-20 DIAGNOSIS — IMO0002 Reserved for concepts with insufficient information to code with codable children: Secondary | ICD-10-CM

## 2020-09-20 DIAGNOSIS — I10 Essential (primary) hypertension: Secondary | ICD-10-CM | POA: Diagnosis not present

## 2020-09-20 DIAGNOSIS — E1142 Type 2 diabetes mellitus with diabetic polyneuropathy: Secondary | ICD-10-CM

## 2020-09-20 LAB — LIPID PANEL
Cholesterol: 314 mg/dL — ABNORMAL HIGH (ref 0–200)
HDL: 56.1 mg/dL (ref >39.00)
LDL Cholesterol: 227 mg/dL — ABNORMAL HIGH (ref 0–99)
NonHDL: 257.71
Total CHOL/HDL Ratio: 6
Triglycerides: 156 mg/dL — ABNORMAL HIGH (ref 0.0–149.0)
VLDL: 31.2 mg/dL (ref 0.0–40.0)

## 2020-09-20 LAB — COMPREHENSIVE METABOLIC PANEL
ALT: 9 U/L (ref 0–35)
AST: 15 U/L (ref 0–37)
Albumin: 4.3 g/dL (ref 3.5–5.2)
Alkaline Phosphatase: 62 U/L (ref 39–117)
BUN: 21 mg/dL (ref 6–23)
CO2: 22 mEq/L (ref 19–32)
Calcium: 9.7 mg/dL (ref 8.4–10.5)
Chloride: 105 mEq/L (ref 96–112)
Creatinine, Ser: 0.81 mg/dL (ref 0.40–1.20)
GFR: 66.08 mL/min (ref >60.00)
Glucose, Bld: 114 mg/dL — ABNORMAL HIGH (ref 70–99)
Potassium: 4.3 mEq/L (ref 3.5–5.1)
Sodium: 137 mEq/L (ref 135–145)
Total Bilirubin: 0.5 mg/dL (ref 0.2–1.2)
Total Protein: 8.1 g/dL (ref 6.0–8.3)

## 2020-09-20 LAB — HEMOGLOBIN A1C: Hgb A1c MFr Bld: 7 % — ABNORMAL HIGH (ref 4.6–6.5)

## 2020-09-20 NOTE — Assessment & Plan Note (Signed)
Well controlled, no changes to meds. Encouraged heart healthy diet such as the DASH diet and exercise as tolerated.  °

## 2020-09-20 NOTE — Assessment & Plan Note (Signed)
Pt states it has improved and she does not want to take the cymbalta at this time

## 2020-09-20 NOTE — Assessment & Plan Note (Signed)
hgba1c to be checked, minimize simple carbs. Increase exercise as tolerated. Continue current meds  

## 2020-09-20 NOTE — Assessment & Plan Note (Signed)
Encourage heart healthy diet such as MIND or DASH diet, increase exercise, avoid trans fats, simple carbohydrates and processed foods, consider a krill or fish or flaxseed oil cap daily.  °

## 2020-09-20 NOTE — Progress Notes (Signed)
Established Patient Office Visit  Subjective:  Patient ID: Felicia Martin, female    DOB: [birth year]  Age: 87 y.o. MRN: [id number]  CC:  Chief Complaint  Patient presents with   Hyperlipidemia   Diabetes   Hypertension   Follow-up    HPI Felicia Martin presents for f/u dm, cholesterol and bp.    HYPERTENSION  Blood pressure range-not checking   Chest pain- no      Dyspnea- no Lightheadedness- no   Edema- no Other side effects - no   Medication compliance: good Low salt diet- yes  DIABETES  Blood Sugar ranges-avg 113  Polyuria- no New Visual problems- no Hypoglycemic symptoms- no Other side effects-no Medication compliance - good Last eye exam- June 2021 Foot exam- today  HYPERLIPIDEMIA  Medication compliance- good RUQ pain- no  Muscle aches- no Other side effects-no     Past Medical History:  Diagnosis Date   Asthma    GERD (gastroesophageal reflux disease)    Hypercholesterolemia    Hypertension    Pulmonary fibrosis (HCC)    Sarcoidosis    Surgical history of tubal ligation 1960s    Past Surgical History:  Procedure Laterality Date   COLONOSCOPY  12/09/14   ESOPHAGOGASTRODUODENOSCOPY  12/09/14   LUNG SURGERY  1960   Right lung   Maloney dilation  12/09/14   TUBAL LIGATION  1960    Family History  Problem Relation Age of Onset   Diabetes Sister        x's 3   Alzheimer's disease Sister    Liver cancer Mother    Liver cancer Father    Cancer Father    Cancer Brother    Diabetes Brother    Hypertension Sister        x's 3   Hyperlipidemia Other    Breast cancer Neg Hx     Social History   Socioeconomic History   Marital status: Single    Spouse name: Not on file   Number of children: 4   Years of education: Not on file   Highest education level: Not on file  Occupational History   Occupation: Chartered certified accountant  Tobacco Use   Smoking status: Former    Packs/day: 0.50    Years: 40.00    Pack years: 20.00    Types:  Cigarettes    Quit date: 05/25/1999    Years since quitting: 21.3   Smokeless tobacco: Never  Substance and Sexual Activity   Alcohol use: No   Drug use: No   Sexual activity: Never  Other Topics Concern   Not on file  Social History Narrative   Not on file   Social Determinants of Health   Financial Resource Strain: Medium Risk   Difficulty of Paying Living Expenses: Somewhat hard  Food Insecurity: Not on file  Transportation Needs: No Transportation Needs   Lack of Transportation (Medical): No   Lack of Transportation (Non-Medical): No  Physical Activity: Not on file  Stress: Not on file  Social Connections: Not on file  Intimate Partner Violence: Not on file    Outpatient Medications Prior to Visit  Medication Sig Dispense Refill   albuterol (VENTOLIN HFA) 108 (90 Base) MCG/ACT inhaler Inhale 2 puffs into the lungs every 6 (six) hours as needed. 18 g 2   amLODipine (NORVASC) 5 MG tablet TAKE 1 TABLET(5 MG) BY MOUTH DAILY 90 tablet 1   Blood Glucose Monitoring Suppl (ONETOUCH VERIO) w/Device KIT Use as directed once a  day.  Dx code: E11.9 1 kit 0   glucose blood (ONETOUCH VERIO) test strip Use as instructed once a day.  Dx Code: E11.9 100 each 1   hydrochlorothiazide (HYDRODIURIL) 25 MG tablet Take 1 tablet (25 mg total) by mouth daily. 90 tablet 3   Lancets (ONETOUCH DELICA PLUS [id number]) MISC USE ONCE A DAY 100 each 1   meloxicam (MOBIC) 7.5 MG tablet Take 1 tablet (7.5 mg total) by mouth daily. 30 tablet 3   metFORMIN (GLUCOPHAGE) 500 MG tablet Take 1 tablet (500 mg total) by mouth daily with breakfast. 90 tablet 1   ofloxacin (FLOXIN OTIC) 0.3 % OTIC solution Place 10 drops into both ears daily. 10 mL 1   DULoxetine (CYMBALTA) 30 MG capsule Take 1 capsule (30 mg total) by mouth daily. (Patient not taking: No sig reported) 90 capsule 0   empagliflozin (JARDIANCE) 10 MG TABS tablet Take 1 tablet (10 mg total) by mouth daily before breakfast. (Patient not taking: No sig  reported) 30 tablet 2   meloxicam (MOBIC) 15 MG tablet TAKE 1 TABLET BY MOUTH EVERY DAY AS NEEDED FOR PAIN (Patient not taking: No sig reported) 30 tablet 2   No facility-administered medications prior to visit.    Allergies  Allergen Reactions   Fenofibrate Rash   Statins Other (See Comments)    Muscle aches and weakness   Zetia [Ezetimibe] Other (See Comments)    Muscle pain    ROS Review of Systems  Constitutional:  Negative for appetite change, diaphoresis, fatigue and unexpected weight change.  Eyes:  Negative for pain, redness and visual disturbance.  Respiratory:  Negative for cough, chest tightness, shortness of breath and wheezing.   Cardiovascular:  Negative for chest pain, palpitations and leg swelling.  Endocrine: Negative for cold intolerance, heat intolerance, polydipsia, polyphagia and polyuria.  Genitourinary:  Negative for difficulty urinating, dysuria and frequency.  Neurological:  Negative for dizziness, light-headedness, numbness and headaches.  Psychiatric/Behavioral:  Negative for confusion, decreased concentration, dysphoric mood, self-injury, sleep disturbance and suicidal ideas. The patient is not nervous/anxious.      Objective:    Physical Exam Vitals and nursing note reviewed.  Constitutional:      Appearance: She is well-developed.  HENT:     Head: Normocephalic and atraumatic.  Eyes:     Conjunctiva/sclera: Conjunctivae normal.  Neck:     Thyroid: No thyromegaly.     Vascular: No carotid bruit or JVD.  Cardiovascular:     Rate and Rhythm: Normal rate and regular rhythm.     Heart sounds: Normal heart sounds. No murmur heard. Pulmonary:     Effort: Pulmonary effort is normal. No respiratory distress.     Breath sounds: Normal breath sounds. No wheezing or rales.  Chest:     Chest wall: No tenderness.  Musculoskeletal:     Cervical back: Normal range of motion and neck supple.  Neurological:     Mental Status: She is alert and oriented to  person, place, and time.    BP 112/86 (BP Location: Right Arm, Patient Position: Sitting, Cuff Size: Large)   Pulse 83   Temp 98.6 F (37 C) (Oral)   Resp 20   Ht 5' (1.524 m)   Wt 212 lb 9.6 oz (96.4 kg)   LMP 03/17/2014   SpO2 94%   BMI 41.52 kg/m  Wt Readings from Last 3 Encounters:  09/20/20 212 lb 9.6 oz (96.4 kg)  05/27/20 216 lb 6.4 oz (98.2 kg)  10/05/19  211 lb 3.2 oz (95.8 kg)     Health Maintenance Due  Topic Date Due   OPHTHALMOLOGY EXAM  Never done   Zoster Vaccines- Shingrix (1 of 2) Never done   COVID-19 Vaccine (3 - Booster for Pfizer series) 11/23/2019    There are no preventive care reminders to display for this patient.  Lab Results  Component Value Date   TSH 2.19 09/29/2009   Lab Results  Component Value Date   WBC 4.4 12/16/2013   HGB 12.2 12/16/2013   HCT 36.7 12/16/2013   MCV 92.0 12/16/2013   PLT 279.0 12/16/2013   Lab Results  Component Value Date   NA 138 05/27/2020   K 4.3 05/27/2020   CO2 26 05/27/2020   GLUCOSE 128 (H) 05/27/2020   BUN 21 05/27/2020   CREATININE 0.84 05/27/2020   BILITOT 0.4 05/27/2020   ALKPHOS 65 05/27/2020   AST 15 05/27/2020   ALT 8 05/27/2020   PROT 8.0 05/27/2020   ALBUMIN 3.9 05/27/2020   CALCIUM 9.6 05/27/2020   GFR 63.40 05/27/2020   Lab Results  Component Value Date   CHOL 282 (H) 05/27/2020   Lab Results  Component Value Date   HDL 55.30 05/27/2020   Lab Results  Component Value Date   LDLCALC 202 (H) 05/27/2020   Lab Results  Component Value Date   TRIG 125.0 05/27/2020   Lab Results  Component Value Date   CHOLHDL 5 05/27/2020   Lab Results  Component Value Date   HGBA1C 7.3 (H) 05/27/2020      Assessment & Plan:   Problem List Items Addressed This Visit       Unprioritized   Hyperlipidemia associated with type 2 diabetes mellitus ([place])   Relevant Orders   Lipid panel   Hemoglobin A1c   Comprehensive metabolic panel   Uncontrolled type 2 diabetes mellitus with  hyperglycemia ([place]) - Primary   Relevant Orders   Lipid panel   Hemoglobin A1c   Comprehensive metabolic panel   Diabetic polyneuropathy associated with type 2 diabetes mellitus ([place])    Pt states it has improved and she does not want to take the cymbalta at this time        DM (diabetes mellitus) type II uncontrolled, periph vascular disorder ([place])    hgba1c to be checked , minimize simple carbs. Increase exercise as tolerated. Continue current meds        Hyperlipidemia    Encourage heart healthy diet such as MIND or DASH diet, increase exercise, avoid trans fats, simple carbohydrates and processed foods, consider a krill or fish or flaxseed oil cap daily.        Primary hypertension    Well controlled, no changes to meds. Encouraged heart healthy diet such as the DASH diet and exercise as tolerated.        Relevant Orders   Lipid panel   Hemoglobin A1c   Comprehensive metabolic panel    No orders of the defined types were placed in this encounter.   Follow-up: No follow-ups on file.    Ann Held, DO

## 2020-09-22 ENCOUNTER — Telehealth: Payer: Self-pay | Admitting: *Deleted

## 2020-09-22 NOTE — Telephone Encounter (Signed)
Call Type Message Only Information Provided Reason for Call Request for Lab/Test Results Initial Comment Caller would like a copy of her recent lab results. Disp. Time Disposition Final User 09/22/2020 7:44:46 AM General Information Provided Yes Richarda Blade

## 2020-09-22 NOTE — Telephone Encounter (Signed)
Labs printed and mailed.

## 2020-11-08 ENCOUNTER — Encounter: Payer: Self-pay | Admitting: Podiatrist

## 2020-11-08 ENCOUNTER — Other Ambulatory Visit: Payer: Self-pay

## 2020-11-08 ENCOUNTER — Ambulatory Visit: Payer: Medicare Other | Admitting: Podiatrist

## 2020-11-08 DIAGNOSIS — B351 Tinea unguium: Secondary | ICD-10-CM | POA: Diagnosis not present

## 2020-11-08 DIAGNOSIS — L608 Other nail disorders: Secondary | ICD-10-CM

## 2020-11-08 DIAGNOSIS — M79609 Pain in unspecified limb: Secondary | ICD-10-CM

## 2020-11-08 NOTE — Patient Instructions (Signed)
Diabetes Mellitus and Foot Care Foot care is an important part of your health, especially when you have diabetes. Diabetes may cause you to have problems because of poor blood flow (circulation) to your feet and legs, which can cause your skin to: Become thinner and drier. Break more easily. Heal more slowly. Peel and crack. You may also have nerve damage (neuropathy) in your legs and feet, causing decreased feeling in them. This means that you may not notice minor injuries to your feet that could lead to more serious problems. Noticing and addressing any potential problems early is the best wayto prevent future foot problems. How to care for your feet Foot hygiene Wash your feet daily with warm water and mild soap. Do not use hot water. Then, pat your feet and the areas between your toes until they are completely dry. Do not soak your feet as this can dry your skin.  Do not dig under the nails or around the cuticle. File the edges of your nails with an emery board or nail file if needed. Apply a moisturizing lotion or petroleum jelly to the skin on your feet and to dry, brittle toenails. Use lotion that does not contain alcohol and is unscented. Do not apply lotion between your toes.  Shoes and socks Wear clean socks or stockings every day. Make sure they are not too tight. Do not wear knee-high stockings since they may decrease blood flow to your legs. Wear shoes that fit properly and have enough cushioning. Always look in your shoes before you put them on to be sure there are no objects inside. To break in new shoes, wear them for just a few hours a day. This prevents injuries on your feet. Wounds, scrapes, corns, and calluses  Check your feet daily for blisters, cuts, bruises, sores, and redness. If you cannot see the bottom of your feet, use a mirror or ask someone for help. Do not cut corns or calluses or try to remove them with medicine. If you find a minor scrape, cut, or break in the  skin on your feet, keep it and the skin around it clean and dry. You may clean these areas with mild soap and water. Do not clean the area with peroxide, alcohol, or iodine. If you have a wound, scrape, corn, or callus on your foot, look at it several times a day to make sure it is healing and not infected. Check for: Redness, swelling, or pain. Fluid or blood. Warmth. Pus or a bad smell.  General tips Do not cross your legs. This may decrease blood flow to your feet. Do not use heating pads or hot water bottles on your feet. They may burn your skin. If you have lost feeling in your feet or legs, you may not know this is happening until it is too late. Protect your feet from hot and cold by wearing shoes, such as at the beach or on hot pavement. Schedule a complete foot exam at least once a year (annually) or more often if you have foot problems. Report any cuts, sores, or bruises to your health care provider immediately. Where to find more information American Diabetes Association: www.diabetes.org Association of Diabetes Care & Education Specialists: www.diabeteseducator.org Contact a health care provider if: You have a medical condition that increases your risk of infection and you have any cuts, sores, or bruises on your feet. You have an injury that is not healing. You have redness on your legs or feet. You feel  burning or tingling in your legs or feet. You have pain or cramps in your legs and feet. Your legs or feet are numb. Your feet always feel cold. You have pain around any toenails. Get help right away if: You have a wound, scrape, corn, or callus on your foot and: You have pain, swelling, or redness that gets worse. You have fluid or blood coming from the wound, scrape, corn, or callus. Your wound, scrape, corn, or callus feels warm to the touch. You have pus or a bad smell coming from the wound, scrape, corn, or callus. You have a fever. You have a red line going up your  leg. Summary Check your feet every day for blisters, cuts, bruises, sores, and redness. Apply a moisturizing lotion or petroleum jelly to the skin on your feet and to dry, brittle toenails. Wear shoes that fit properly and have enough cushioning. If you have foot problems, report any cuts, sores, or bruises to your health care provider immediately. Schedule a complete foot exam at least once a year (annually) or more often if you have foot problems. This information is not intended to replace advice given to you by your health care provider. Make sure you discuss any questions you have with your healthcare provider. Document Revised: 09/24/2019 Document Reviewed: 09/24/2019 Elsevier Patient Education  [place].

## 2020-11-08 NOTE — Progress Notes (Signed)
Chief Complaint  Patient presents with   Debridement    Trim toenails - last a1c was 7.0     HPI: Patient is 87 y.o. female who presents today for at risk foot care due to being a diabetic.  She has painful incurvated toenails which she is unable to trim and she relates they are painful when they become elongated especially in shoes.     Allergies  Allergen Reactions   Fenofibrate Rash   Statins Other (See Comments)    Muscle aches and weakness   Zetia [Ezetimibe] Other (See Comments)    Muscle pain    Review of systems is reviewed and negative.   Physical Exam  Patient is awake, alert, and oriented x 3.  In no acute distress.    Vascular status is intact with faintly palpable pedal pulses DP and PT bilateral and capillary refill time less than 3 seconds bilateral.  No edema or erythema noted.   Neurological exam reveals epicritic and protective sensation diminshed via Semmes Weinstein monofilament at 2/5 sites bilateral and vibratory sensation decreased bilateral.   Dermatological exam reveals skin is supple and dry to bilateral feet.  No open lesions present.   Digital nails are thick, discolored, dystrophic, brittle with pincer nails present and clinically mycotic x 10.    Musculoskeletal exam: Musculature intact with dorsiflexion, plantarflexion, inversion, eversion. Ankle and First MPJ joint range of motion normal.     Assessment: 1. Dermatophytosis of nail   2. Pain due to onychomycosis of nail   3. Pincer nail deformity      Plan: Debridement of toenails was recommended.  Onychoreduction of symptomatic toenails was performed via nail nipper and power burr.  A small amount of nail was affixed to the underlying nail bed on the lateral corner of the left hallux which caused the toe to bleed.  This was treated with lumicaine and antibiotic ointment and a bandaid was placed.  She was instructed to watch the area for redness or pain.   Patient was instructed on signs and  symptoms of infection and was told to call immediately should any of these arise.  She will return in 3 months for continued care and follow up.

## 2020-11-10 ENCOUNTER — Ambulatory Visit (INDEPENDENT_AMBULATORY_CARE_PROVIDER_SITE_OTHER): Payer: Medicare Other

## 2020-11-10 ENCOUNTER — Telehealth: Payer: Medicare Other

## 2020-11-10 ENCOUNTER — Telehealth: Payer: Self-pay

## 2020-11-10 VITALS — Ht 60.0 in | Wt 212.0 lb

## 2020-11-10 DIAGNOSIS — Z0000 Encounter for general adult medical examination without abnormal findings: Secondary | ICD-10-CM | POA: Diagnosis not present

## 2020-11-10 NOTE — Telephone Encounter (Signed)
Patient outreached attempted x 1. Unable to reach patient. LM on VM with my contact number [phone]

## 2020-11-10 NOTE — Patient Instructions (Signed)
Felicia Martin , Thank you for taking time to complete your Medicare Wellness Visit. I appreciate your ongoing commitment to your health goals. Please review the following plan we discussed and let me know if I can assist you in the future.   Screening recommendations/referrals: Colonoscopy: No longer required. Mammogram: Due-Per our conversation, You will call to schedule Bone Density: Due-Declined today. Please call the office to schedule if you change your mind. Recommended yearly ophthalmology/optometry visit for glaucoma screening and checkup Recommended yearly dental visit for hygiene and checkup  Vaccinations: Influenza vaccine: Declined Pneumococcal vaccine: Declined Tdap vaccine: Discuss with pharmacy Shingles vaccine: Discuss with pharmacy   Covid-19:Up to date-Please bring your card to your next appt  Advanced directives: Please bring a copy for your chart  Conditions/risks identified: See problem list  Next appointment: Follow up in one year for your annual wellness visit    Preventive Care 65 Years and Older, Female Preventive care refers to lifestyle choices and visits with your health care provider that can promote health and wellness. What does preventive care include? A yearly physical exam. This is also called an annual well check. Dental exams once or twice a year. Routine eye exams. Ask your health care provider how often you should have your eyes checked. Personal lifestyle choices, including: Daily care of your teeth and gums. Regular physical activity. Eating a healthy diet. Avoiding tobacco and drug use. Limiting alcohol use. Practicing safe sex. Taking low-dose aspirin every day. Taking vitamin and mineral supplements as recommended by your health care provider. What happens during an annual well check? The services and screenings done by your health care provider during your annual well check will depend on your age, overall health, lifestyle risk factors,  and family history of disease. Counseling  Your health care provider may ask you questions about your: Alcohol use. Tobacco use. Drug use. Emotional well-being. Home and relationship well-being. Sexual activity. Eating habits. History of falls. Memory and ability to understand (cognition). Work and work Statistician. Reproductive health. Screening  You may have the following tests or measurements: Height, weight, and BMI. Blood pressure. Lipid and cholesterol levels. These may be checked every 5 years, or more frequently if you are over 12 years old. Skin check. Lung cancer screening. You may have this screening every year starting at age 61 if you have a 30-pack-year history of smoking and currently smoke or have quit within the past 15 years. Fecal occult blood test (FOBT) of the stool. You may have this test every year starting at age 23. Flexible sigmoidoscopy or colonoscopy. You may have a sigmoidoscopy every 5 years or a colonoscopy every 10 years starting at age 80. Hepatitis C blood test. Hepatitis B blood test. Sexually transmitted disease (STD) testing. Diabetes screening. This is done by checking your blood sugar (glucose) after you have not eaten for a while (fasting). You may have this done every 1-3 years. Bone density scan. This is done to screen for osteoporosis. You may have this done starting at age 49. Mammogram. This may be done every 1-2 years. Talk to your health care provider about how often you should have regular mammograms. Talk with your health care provider about your test results, treatment options, and if necessary, the need for more tests. Vaccines  Your health care provider may recommend certain vaccines, such as: Influenza vaccine. This is recommended every year. Tetanus, diphtheria, and acellular pertussis (Tdap, Td) vaccine. You may need a Td booster every 10 years. Zoster vaccine. You  may need this after age 49. Pneumococcal 13-valent conjugate  (PCV13) vaccine. One dose is recommended after age 60. Pneumococcal polysaccharide (PPSV23) vaccine. One dose is recommended after age 74. Talk to your health care provider about which screenings and vaccines you need and how often you need them. This information is not intended to replace advice given to you by your health care provider. Make sure you discuss any questions you have with your health care provider. Document Released: 04/01/2015 Document Revised: 11/23/2015 Document Reviewed: 01/04/2015 Elsevier Interactive Patient Education  2017 [place] Prevention in the Home Falls can cause injuries. They can happen to people of all ages. There are many things you can do to make your home safe and to help prevent falls. What can I do on the outside of my home? Regularly fix the edges of walkways and driveways and fix any cracks. Remove anything that might make you trip as you walk through a door, such as a raised step or threshold. Trim any bushes or trees on the path to your home. Use bright outdoor lighting. Clear any walking paths of anything that might make someone trip, such as rocks or tools. Regularly check to see if handrails are loose or broken. Make sure that both sides of any steps have handrails. Any raised decks and porches should have guardrails on the edges. Have any leaves, snow, or ice cleared regularly. Use sand or salt on walking paths during winter. Clean up any spills in your garage right away. This includes oil or grease spills. What can I do in the bathroom? Use night lights. Install grab bars by the toilet and in the tub and shower. Do not use towel bars as grab bars. Use non-skid mats or decals in the tub or shower. If you need to sit down in the shower, use a plastic, non-slip stool. Keep the floor dry. Clean up any water that spills on the floor as soon as it happens. Remove soap buildup in the tub or shower regularly. Attach bath mats securely with  double-sided non-slip rug tape. Do not have throw rugs and other things on the floor that can make you trip. What can I do in the bedroom? Use night lights. Make sure that you have a light by your bed that is easy to reach. Do not use any sheets or blankets that are too big for your bed. They should not hang down onto the floor. Have a firm chair that has side arms. You can use this for support while you get dressed. Do not have throw rugs and other things on the floor that can make you trip. What can I do in the kitchen? Clean up any spills right away. Avoid walking on wet floors. Keep items that you use a lot in easy-to-reach places. If you need to reach something above you, use a strong step stool that has a grab bar. Keep electrical cords out of the way. Do not use floor polish or wax that makes floors slippery. If you must use wax, use non-skid floor wax. Do not have throw rugs and other things on the floor that can make you trip. What can I do with my stairs? Do not leave any items on the stairs. Make sure that there are handrails on both sides of the stairs and use them. Fix handrails that are broken or loose. Make sure that handrails are as long as the stairways. Check any carpeting to make sure that it is firmly  attached to the stairs. Fix any carpet that is loose or worn. Avoid having throw rugs at the top or bottom of the stairs. If you do have throw rugs, attach them to the floor with carpet tape. Make sure that you have a light switch at the top of the stairs and the bottom of the stairs. If you do not have them, ask someone to add them for you. What else can I do to help prevent falls? Wear shoes that: Do not have high heels. Have rubber bottoms. Are comfortable and fit you well. Are closed at the toe. Do not wear sandals. If you use a stepladder: Make sure that it is fully opened. Do not climb a closed stepladder. Make sure that both sides of the stepladder are locked  into place. Ask someone to hold it for you, if possible. Clearly mark and make sure that you can see: Any grab bars or handrails. First and last steps. Where the edge of each step is. Use tools that help you move around (mobility aids) if they are needed. These include: Canes. Walkers. Scooters. Crutches. Turn on the lights when you go into a dark area. Replace any light bulbs as soon as they burn out. Set up your furniture so you have a clear path. Avoid moving your furniture around. If any of your floors are uneven, fix them. If there are any pets around you, be aware of where they are. Review your medicines with your doctor. Some medicines can make you feel dizzy. This can increase your chance of falling. Ask your doctor what other things that you can do to help prevent falls. This information is not intended to replace advice given to you by your health care provider. Make sure you discuss any questions you have with your health care provider. Document Released: 12/30/2008 Document Revised: 08/11/2015 Document Reviewed: 04/09/2014 Elsevier Interactive Patient Education  2017 [employer].

## 2020-11-10 NOTE — Progress Notes (Signed)
Subjective:   Felicia Martin is a 87 y.o. female who presents for Medicare Annual (Subsequent) preventive examination.  I connected with Felicia Martin today by telephone and verified that I am speaking with the correct person using two identifiers. Location patient: home Location provider: work Persons participating in the virtual visit: patient, Marine scientist.    I discussed the limitations, risks, security and prielinevacy concerns of performing an evaluation and management service by telephone and the availability of in person appointments. I also discussed with the patient that there may be a patient responsible charge related to this service. The patient expressed understanding and verbally consented to this telephonic visit.    Interactive audio and video telecommunications were attempted between this provider and patient, however failed, due to patient having technical difficulties OR patient did not have access to video capability.  We continued and completed visit with audio only.  Some vital signs may be absent or patient reported.   Time Spent with patient on telephone encounter: 30 minutes   Review of Systems     Cardiac Risk Factors include: advanced age (>66mn, >>62women);diabetes mellitus;dyslipidemia;hypertension;obesity (BMI >30kg/m2)     Objective:    Today's Vitals   11/10/20 0820  Weight: 212 lb (96.2 kg)  Height: 5' (1.524 m)   Body mass index is 41.4 kg/m.  Advanced Directives 11/10/2020 04/30/2019 04/28/2018 08/09/2017 03/26/2017 07/18/2016 02/23/2016  Does Patient Have a Medical Advance Directive? Yes Yes Yes No Yes Yes Yes  Type of [place]amed[place]of [place] will [place]Living will [place]Living will - [place] FallsLiving will - [place] will  Does patient want to make changes to medical advance directive? - No - Patient declined No - Patient declined - No -  Patient declined No - Patient declined -  Copy of HAshburnin Chart? No - copy requested No - copy requested No - copy requested - No - copy requested - No - copy requested  Would patient like information on creating a medical advance directive? - - - No - Patient declined - - -    Current Medications (verified) Outpatient Encounter Medications as of 11/10/2020  Medication Sig   albuterol (VENTOLIN HFA) 108 (90 Base) MCG/ACT inhaler Inhale 2 puffs into the lungs every 6 (six) hours as needed.   amLODipine (NORVASC) 5 MG tablet TAKE 1 TABLET(5 MG) BY MOUTH DAILY   Blood Glucose Monitoring Suppl (ONETOUCH VERIO) w/Device KIT Use as directed once a day.  Dx code: E11.9   glucose blood (ONETOUCH VERIO) test strip Use as instructed once a day.  Dx Code: E11.9   hydrochlorothiazide (HYDRODIURIL) 25 MG tablet Take 1 tablet (25 mg total) by mouth daily.   Lancets (ONETOUCH DELICA PLUS [id number] MISC USE ONCE A DAY   meloxicam (MOBIC) 7.5 MG tablet Take 1 tablet (7.5 mg total) by mouth daily.   metFORMIN (GLUCOPHAGE) 500 MG tablet Take 1 tablet (500 mg total) by mouth daily with breakfast.   ofloxacin (FLOXIN OTIC) 0.3 % OTIC solution Place 10 drops into both ears daily.   DULoxetine (CYMBALTA) 30 MG capsule Take 1 capsule (30 mg total) by mouth daily. (Patient not taking: No sig reported)   empagliflozin (JARDIANCE) 10 MG TABS tablet Take 1 tablet (10 mg total) by mouth daily before breakfast. (Patient not taking: No sig reported)   meloxicam (MOBIC) 15 MG tablet TAKE 1 TABLET BY MOUTH EVERY DAY AS NEEDED FOR PAIN (  Patient not taking: No sig reported)   No facility-administered encounter medications on file as of 11/10/2020.    Allergies (verified) Fenofibrate, Statins, and Zetia [ezetimibe]   History: Past Medical History:  Diagnosis Date   Asthma    GERD (gastroesophageal reflux disease)    Hypercholesterolemia    Hypertension    Pulmonary fibrosis ([place])    Sarcoidosis     Surgical history of tubal ligation 1960s   Past Surgical History:  Procedure Laterality Date   COLONOSCOPY  12/09/14   ESOPHAGOGASTRODUODENOSCOPY  12/09/14   LUNG SURGERY  1960   Right lung   Maloney dilation  12/09/14   TUBAL LIGATION  1960   Family History  Problem Relation Age of Onset   Diabetes Sister        x's 3   Alzheimer's disease Sister    Liver cancer Mother    Liver cancer Father    Cancer Father    Cancer Brother    Diabetes Brother    Hypertension Sister        x's 3   Hyperlipidemia Other    Breast cancer Neg Hx    Social History   Socioeconomic History   Marital status: Widowed    Spouse name: Not on file   Number of children: 4   Years of education: Not on file   Highest education level: Not on file  Occupational History   Occupation: Chartered certified accountant  Tobacco Use   Smoking status: Former    Packs/day: 0.50    Years: 40.00    Pack years: 20.00    Types: Cigarettes    Quit date: 05/25/1999    Years since quitting: 21.4   Smokeless tobacco: Never  Substance and Sexual Activity   Alcohol use: No   Drug use: No   Sexual activity: Never  Other Topics Concern   Not on file  Social History Narrative   Not on file   Social Determinants of Health   Financial Resource Strain: Low Risk    Difficulty of Paying Living Expenses: Not very hard  Food Insecurity: No Food Insecurity   Worried About Running Out of Food in the Last Year: Never true   [facility] in the Last Year: Never true  Transportation Needs: No Transportation Needs   Lack of Transportation (Medical): No   Lack of Transportation (Non-Medical): No  Physical Activity: Inactive   Days of Exercise per Week: 0 days   Minutes of Exercise per Session: 0 min  Stress: No Stress Concern Present   Feeling of Stress : Not at all  Social Connections: Moderately Isolated   Frequency of Communication with Friends and Family: More than three times a week   Frequency of Social Gatherings with  Friends and Family: More than three times a week   Attends Religious Services: 1 to 4 times per year   Active Member of [employer] or Organizations: No   Attends Archivist Meetings: Never   Marital Status: Widowed    Tobacco Counseling Counseling given: Not Answered   Clinical Intake:  Pre-visit preparation completed: Yes  Pain : No/denies pain     Nutritional Status: BMI > 30  Obese Nutritional Risks: None Diabetes: Yes CBG done?: No Did pt. bring in CBG monitor from home?: No (phone visit)  How often do you need to have someone help you when you read instructions, pamphlets, or other written materials from your doctor or pharmacy?: 1 - Never  Diabetes:  Is  the patient diabetic?  Yes  If diabetic, was a CBG obtained today?  No  Did the patient bring in their glucometer from home?  No phone visit How often do you monitor your CBG's? daily.   Financial Strains and Diabetes Management:  Are you having any financial strains with the device, your supplies or your medication? Yes . Jardiance Does the patient want to be seen by Chronic Care Management for management of their diabetes?  No patient already seeing pharmacist Would the patient like to be referred to a Nutritionist or for Diabetic Management?  No   Diabetic Exams:  Diabetic Eye Exam: . Overdue for diabetic eye exam. Pt has been advised about the importance in completing this exam. Patient plans to make an appt soon  Diabetic Foot Exam: Completed 06/13/2020.   Interpreter Needed?: No  Information entered by :: Caroleen Hamman LPN   Activities of Daily Living In your present state of health, do you have any difficulty performing the following activities: 11/10/2020 05/27/2020  Hearing? Y Y  Comment hearing aids -  Vision? N N  Difficulty concentrating or making decisions? N Y  Walking or climbing stairs? N Y  Dressing or bathing? N N  Doing errands, shopping? N N  Preparing Food and eating ? N -   Using the Toilet? N -  In the past six months, have you accidently leaked urine? N -  Do you have problems with loss of bowel control? N -  Managing your Medications? N -  Managing your Finances? N -  Housekeeping or managing your Housekeeping? N -  Some recent data might be hidden    Patient Care Team: Carollee Herter, Alferd Apa, DO as PCP - General (Family Medicine) Inda Castle, MD (Inactive) as Consulting Physician (Gastroenterology) Cherre Robins, PharmD (Pharmacist)  Indicate any recent Medical Services you may have received from other than [facility] providers in the past year (date may be approximate).     Assessment:   This is a routine wellness examination for Lacye.  Hearing/Vision screen Hearing Screening - Comments:: Bilateral hearing aids Vision Screening - Comments:: Last eye exam-09/2019-[facility] issues and exercise activities discussed: Current Exercise Habits: The patient does not participate in regular exercise at present, Exercise limited by: None identified   Goals Addressed               This Visit's Progress     Patient Stated     Continue to improve diet.   (pt-stated)   On track      Depression Screen PHQ 2/9 Scores 11/10/2020 05/27/2020 04/30/2019 04/28/2018 03/26/2017 07/18/2016 02/23/2016  PHQ - 2 Score 0 0 0 0 0 0 0    Fall Risk Fall Risk  11/10/2020 09/20/2020 05/27/2020 04/30/2019 04/28/2018  Falls in the past year? 0 0 0 0 0  Number falls in past yr: 0 0 0 0 -  Injury with Fall? 0 0 0 0 -  Risk for fall due to : - Impaired mobility - - -  Risk for fall due to: Comment - - - - -  Follow up Falls prevention discussed Falls evaluation completed Falls evaluation completed Education provided;Falls prevention discussed -    FALL RISK PREVENTION PERTAINING TO THE HOME:  Any stairs in or around the home? Yes  If so, are there any without handrails? No  Home free of loose throw rugs in walkways, pet beds, electrical cords, etc?  Yes  Adequate lighting in your home  to reduce risk of falls? Yes   ASSISTIVE DEVICES UTILIZED TO PREVENT FALLS:  Life alert? No  Use of a cane, walker or w/c? Yes  Grab bars in the bathroom? Yes  Shower chair or bench in shower? No  Elevated toilet seat or a handicapped toilet? No   TIMED UP AND GO:  Was the test performed? No . Phone visit   Cognitive Function:Normal cognitive status assessed by this Nurse Health Advisor. No abnormalities found.   MMSE - Mini Mental State Exam 03/26/2017 02/23/2016 02/23/2015  Orientation to time _0 Orientation to Place _1 Registration _2 Attention/ Calculation _3 Recall _4 Language- name 2 objects _5 Language- repeat _6 Language- follow 3 step command _7 Language- read & follow direction _8 Write a sentence _9 Copy design _10 Total score _11 Immunizations Immunization History  Administered Date(s) Administered   PFIZER(Purple Top)SARS-COV-2 Vaccination 06/01/2019, 06/23/2019   Td 03/19/2002    TDAP status: Due, Education has been provided regarding the importance of this vaccine. Advised may receive this vaccine at local pharmacy or Health Dept. Aware to provide a copy of the vaccination record if obtained from local pharmacy or Health Dept. Verbalized acceptance and understanding.  Flu Vaccine status: Due, Education has been provided regarding the importance of this vaccine. Advised may receive this vaccine at local pharmacy or Health Dept. Aware to provide a copy of the vaccination record if obtained from local pharmacy or Health Dept. Verbalized acceptance and understanding.  Pneumococcal vaccine status: Due, Education has been provided regarding the importance of this vaccine. Advised may receive this vaccine at local pharmacy or Health Dept. Aware to provide a copy of the vaccination record if obtained from local pharmacy or Health Dept. Verbalized acceptance and  understanding.  Covid-19 vaccine status: Completed vaccines per patient  Qualifies for Shingles Vaccine? Yes   Zostavax completed No   Shingrix Completed?: No.    Education has been provided regarding the importance of this vaccine. Patient has been advised to call insurance company to determine out of pocket expense if they have not yet received this vaccine. Advised may also receive vaccine at local pharmacy or Health Dept. Verbalized acceptance and understanding.  Screening Tests Health Maintenance  Topic Date Due   OPHTHALMOLOGY EXAM  Never done   Zoster Vaccines- Shingrix (1 of 2) Never done   COVID-19 Vaccine (3 - Booster for Pfizer series) 11/23/2019   MAMMOGRAM  09/20/2021 (Originally 11/08/2018)   TETANUS/TDAP  03/18/2022 (Originally 03/19/2012)   PNA vac Low Risk Adult (1 of 2 - PCV13) 03/19/2026 (Originally 01/23/2000)   INFLUENZA VACCINE  06/17/2026 (Originally 10/17/2020)   HEMOGLOBIN A1C  03/23/2021   URINE MICROALBUMIN  05/27/2021   FOOT EXAM  06/13/2021   DEXA SCAN  Completed   HPV VACCINES  Aged Out    Health Maintenance  Health Maintenance Due  Topic Date Due   OPHTHALMOLOGY EXAM  Never done   Zoster Vaccines- Shingrix (1 of 2) Never done   COVID-19 Vaccine (3 - Booster for Pfizer series) 11/23/2019    Colorectal cancer screening: No longer required.   Mammogram status: Due-Patient states she will call to schedule  Bone Density status: Due-Declined today  Lung Cancer Screening: (Low Dose CT Chest recommended if Age 52-80 years,  30 pack-year currently smoking OR have quit w/in 15years.) does not qualify.    Additional Screening:  Hepatitis C Screening: does not qualify  Vision Screening: Recommended annual ophthalmology exams for early detection of glaucoma and other disorders of the eye. Is the patient up to date with their annual eye exam?  No  Who is the provider or what is the name of the office in which the patient attends annual eye exams? [facility]   Dental Screening: Recommended annual dental exams for proper oral hygiene  Community Resource Referral / Chronic Care Management: CRR required this visit?  No   CCM required this visit?  No      Plan:     I have personally reviewed and noted the following in the patient's chart:   Medical and social history Use of alcohol, tobacco or illicit drugs  Current medications and supplements including opioid prescriptions.  Functional ability and status Nutritional status Physical activity Advanced directives List of other physicians Hospitalizations, surgeries, and ER visits in previous 12 months Vitals Screenings to include cognitive, depression, and falls Referrals and appointments  In addition, I have reviewed and discussed with patient certain preventive protocols, quality metrics, and best practice recommendations. A written personalized care plan for preventive services as well as general preventive health recommendations were provided to patient.   Due to this being a telephonic visit, the after visit summary with patients personalized plan was offered to patient via mail or my-chart. Per request, patient was mailed a copy of AVS.   Marta Antu, LPN   [id number]  Nurse Health Advisor  Nurse Notes: None

## 2020-11-10 NOTE — Telephone Encounter (Signed)
HI Tammy, During patient's Medicare Wellness exam she asked about the status of the Jardiance assistance that she had applied for. She is asking that you give her a call when you get a chance.

## 2020-11-11 ENCOUNTER — Ambulatory Visit (INDEPENDENT_AMBULATORY_CARE_PROVIDER_SITE_OTHER): Payer: Medicare Other | Admitting: Pharmacist

## 2020-11-11 DIAGNOSIS — E1165 Type 2 diabetes mellitus with hyperglycemia: Secondary | ICD-10-CM

## 2020-11-11 DIAGNOSIS — E1169 Type 2 diabetes mellitus with other specified complication: Secondary | ICD-10-CM

## 2020-11-11 DIAGNOSIS — E785 Hyperlipidemia, unspecified: Secondary | ICD-10-CM

## 2020-11-11 DIAGNOSIS — I10 Essential (primary) hypertension: Secondary | ICD-10-CM

## 2020-11-11 NOTE — Patient Instructions (Signed)
Visit Information  PATIENT GOALS:  Goals Addressed             This Visit's Progress    [place] Plan       CARE PLAN ENTRY (see longitudinal plan of care for additional care plan information)  Current Barriers:  Chronic Disease Management support, education, and care coordination needs related to Asthma, Diabetes, Hypertension, Hyperlipidemia, Chronic Pain, Osteopenia   Hypertension BP Readings from Last 3 Encounters:  09/20/20 112/86  05/27/20 (!) 150/96  10/05/19 140/78  Pharmacist Clinical Goal(s): Over the next 90 days, patient will work with PharmD and providers to maintain BP goal <140/90 Current regimen:  Amlodipine 5 mg daily  Hydrochlorothiazide 25 mg daily Interventions: Requested patient to record blood pressure when checking daily Patient self care activities - Over the next 90 days, patient will: Check blood pressure daily, document, and provide at future appointments Ensure daily salt intake < 2300 mg/day  Hyperlipidemia Lipid Panel     Component Value Date/Time   CHOL 314 (H) 09/20/2020 1102   TRIG 156.0 (H) 09/20/2020 1102   HDL 56.10 09/20/2020 1102   CHOLHDL 6 09/20/2020 1102   VLDL 31.2 09/20/2020 1102   LDLCALC 227 (H) 09/20/2020 1102   LDLDIRECT 230.7 03/18/2013 0805  Pharmacist Clinical Goal(s): Over the next 180 days, patient will work with PharmD and providers to achieve LDL goal <100, Total Cholesterol (CHOL) goal <200 or prevent continue upward trend Current regimen:  None currently Interventions: Has not been able to tolerate statin medications in past Discussed importance of LDL goal Will check on patient assistance for Praluent (alternative to [place]) Patient self care activities - Over the next 180 days, patient will: Reduce cholesterol intake in diet   Diabetes Lab Results  Component Value Date/Time   HGBA1C 7.0 (H) 09/20/2020 11:02 AM   HGBA1C 7.3 (H) 05/27/2020 09:20 AM  Pharmacist Clinical  Goal(s): Over the next 90 days, patient will work with PharmD and providers to maintain A1c goal <7% Current regimen:  Metformin XR 500 mg daily Jardiance '10mg'$  daily (not taking due to cost)  Interventions: Discussed the importance of limiting carbohydrates (45 grams per meal) Contacted [employer] program regarding Jardiance. They received application but has not been process yet due to missing information - no license number for provider and patient income on application was about cut off (thought this could be a mistake)  Updated provider information and verified patient income (comma was in the wrong place) and resent application Patient self care activities - Over the next 90 days, patient will: Check blood sugar 3 to 4 times per week document, and provide at future appointments Contact provider with any episodes of hypoglycemia  Asthma Pharmacist Clinical Goal(s) Over the next 90 days, patient will work with PharmD and providers to reduce symptoms of asthma Current regimen:  Albuterol HFA 108 mcg 2 puffs every 6 hours as needed  Patient self care activities - Over the next 90 days, patient will: Continue current therapy for asthma control Contact provider if using albuterol more than twice per week. May need to restart Flovent inhaler  Chronic Pain/Neuropathy Pharmacist Clinical Goal(s) Over the next 90 days, patient will work with PharmD and providers to reduce symptoms of pain/neuropathy Current regimen:  Duloxetine '30mg'$  daily (patient never started)  Interventions: Discussed duloxetine and neuropathy - patient reported neuropathy has improved without needing to start duloxetine Patient self care activities - Over the next 90 days, patient will: Continue to follow  up with Dr Jacqualyn Posey and primary care provider; Contact provider if neuropathy returns or worsens to discuss treatment.   Medication management Pharmacist Clinical Goal(s): Over the next 90 days, patient will work with  PharmD and providers to achieve optimal medication adherence Current pharmacy: [facility] Interventions Comprehensive medication review performed. Continue current medication management strategy Patient self care activities - Over the next 90 days, patient will: Focus on medication adherence by filling and taking medications appropriately  Take medications as prescribed Report any questions or concerns to PharmD and/or provider(s) [place] over the counter medication benefits at [phone]. You should get $50 per quarter to use for calcium and vitamin D supplements and other non prescription medications.    Please see past updates related to this goal by clicking on the "Past Updates" button in the selected goal          The patient verbalized understanding of instructions, educational materials, and care plan provided today and declined offer to receive copy of patient instructions, educational materials, and care plan.   Pharmacy team will check on PAP in 7 to 10 days; Pharmacist follow up scheduled for 12/05/2020  Cherre Robins, PharmD Clinical Pharmacist [place] [facility]

## 2020-11-11 NOTE — Chronic Care Management (AMB) (Signed)
Chronic Care Management Pharmacy Note  11/11/2020 Name:  Felicia Martin MRN:  [id number] DOB:  [birth year]  Summary: Patient did not start Jardiance due to cost - PAP application not process due to missing information LDL not at goal  Recommendations/Changes made from today's visit: Worked with patient, PAP and provider to updated needed information for Jardience PAP and resent.  Considering Praluent is patient is able to get through patient assistance.   Subjective: Felicia Martin is an 87 y.o. year old female who is a primary patient of Ann Held, DO.  The CCM team was consulted for assistance with disease management and care coordination needs.    Engaged with patient by telephone for follow up visit in response to provider referral for pharmacy case management and/or care coordination services.   Consent to Services:  The patient was given information about Chronic Care Management services, agreed to services, and gave verbal consent prior to initiation of services.  Please see initial visit note for detailed documentation.   Patient Care Team: Carollee Herter, Alferd Apa, DO as PCP - General (Family Medicine) Inda Castle, MD (Inactive) as Consulting Physician (Gastroenterology) Cherre Robins, PharmD (Pharmacist)  Recent office visits: 09/20/2020 - PCP Roma Schanz, DO F/U DM and hyperlipidemia. No med changes. A1c improved but LDL still above goal. Referral to lipid clinic per lab notes.  05/27/2020 Roma Schanz, DO (PCP) General follow up. Referral to podiatry. Labs ordered. Follow up in 6 months   Recent consult visits: 08/17/2020 - Podiatry (Dr Prudence Davidson) At risk foot care; Mechanical debridement of toenails on both feet.  06/13/2020 Celesta Gentile, DPM (Podiatry) Diabetic foot evaluation. Started  Cymbalta 30 mg. Follow up in 3 months.  Hospital visits: None in previous 6 months  Objective:  Lab Results  Component Value Date    CREATININE 0.81 09/20/2020   CREATININE 0.84 05/27/2020   CREATININE 0.81 10/05/2019    Lab Results  Component Value Date   HGBA1C 7.0 (H) 09/20/2020   Last diabetic Eye exam: No results found for: HMDIABEYEEXA  Last diabetic Foot exam: No results found for: HMDIABFOOTEX      Component Value Date/Time   CHOL 314 (H) 09/20/2020 1102   TRIG 156.0 (H) 09/20/2020 1102   HDL 56.10 09/20/2020 1102   CHOLHDL 6 09/20/2020 1102   VLDL 31.2 09/20/2020 1102   LDLCALC 227 (H) 09/20/2020 1102   LDLDIRECT 230.7 03/18/2013 0805    Hepatic Function Latest Ref Rng & Units 09/20/2020 05/27/2020 10/05/2019  Total Protein 6.0 - 8.3 g/dL 8.1 8.0 7.9  Albumin 3.5 - 5.2 g/dL 4.3 3.9 4.2  AST 0 - 37 U/L '15 15 14  ' ALT 0 - 35 U/L '9 8 7  ' Alk Phosphatase 39 - 117 U/L 62 65 63  Total Bilirubin 0.2 - 1.2 mg/dL 0.5 0.4 0.4  Bilirubin, Direct 0.0 - 0.3 mg/dL - - -    Lab Results  Component Value Date/Time   TSH 2.19 09/29/2009 03:47 PM   TSH 1.71 08/18/2007 09:14 AM    CBC Latest Ref Rng & Units 12/16/2013 08/06/2012 05/28/2011  WBC 4.0 - 10.5 K/uL 4.4 5.3 4.6  Hemoglobin 12.0 - 15.0 g/dL 12.2 12.5 12.0  Hematocrit 36.0 - 46.0 % 36.7 37.2 35.9(L)  Platelets 150.0 - 400.0 K/uL 279.0 326.0 271.0    Lab Results  Component Value Date/Time   VD25OH 39 09/29/2009 10:48 PM   VD25OH 27 (L) 09/28/2008 09:02 PM    Clinical ASCVD:  No  The ASCVD Risk score Mikey Bussing DC Jr., et al., 2013) failed to calculate for the following reasons:   The 2013 ASCVD risk score is only valid for ages 79 to 73     Social History   Tobacco Use  Smoking Status Former   Packs/day: 0.50   Years: 40.00   Pack years: 20.00   Types: Cigarettes   Quit date: 05/25/1999   Years since quitting: 21.4  Smokeless Tobacco Never   BP Readings from Last 3 Encounters:  09/20/20 112/86  05/27/20 (!) 150/96  10/05/19 140/78   Pulse Readings from Last 3 Encounters:  09/20/20 83  05/27/20 78  10/05/19 71   Wt Readings from Last 3  Encounters:  11/10/20 212 lb (96.2 kg)  09/20/20 212 lb 9.6 oz (96.4 kg)  05/27/20 216 lb 6.4 oz (98.2 kg)    Assessment: Review of patient past medical history, allergies, medications, health status, including review of consultants reports, laboratory and other test data, was performed as part of comprehensive evaluation and provision of chronic care management services.   SDOH:  (Social Determinants of Health) assessments and interventions performed:     CCM Care Plan  Allergies  Allergen Reactions   Fenofibrate Rash   Statins Other (See Comments)    Muscle aches and weakness   Zetia [Ezetimibe] Other (See Comments)    Muscle pain    Medications Reviewed Today     Reviewed by Cherre Robins, PharmD (Pharmacist) on 11/11/20 at [phone]  Med List Status: <None>   Medication Order Taking? Sig Documenting Provider Last Dose Status Informant  albuterol (VENTOLIN HFA) 108 (90 Base) MCG/ACT inhaler [id number] Yes Inhale 2 puffs into the lungs every 6 (six) hours as needed. Carollee Herter, Kendrick Fries R, DO Taking Active   amLODipine (NORVASC) 5 MG tablet [id number] Yes TAKE 1 TABLET(5 MG) BY MOUTH DAILY Roma Schanz R, DO Taking Active   Blood Glucose Monitoring Suppl (ONETOUCH VERIO) w/Device KIT [id number] Yes Use as directed once a day.  Dx code: E11.9 Ann Held, DO Taking Active   DULoxetine (CYMBALTA) 30 MG capsule [id number] No Take 1 capsule (30 mg total) by mouth daily.  Patient not taking: No sig reported   Trula Slade, DPM Not Taking Active   empagliflozin (JARDIANCE) 10 MG TABS tablet [id number] No Take 1 tablet (10 mg total) by mouth daily before breakfast.  Patient not taking: No sig reported   Ann Held, DO Not Taking Active   glucose blood (ONETOUCH VERIO) test strip [id number] Yes Use as instructed once a day.  Dx Code: E11.9 Ann Held, DO Taking Active   hydrochlorothiazide (HYDRODIURIL) 25 MG tablet [id number] Yes Take 1 tablet (25 mg  total) by mouth daily. Ann Held, DO Taking Active   Lancets (ONETOUCH DELICA PLUS PXTGGY69S) [place] [id number] Yes USE ONCE A DAY Ann Held, DO Taking Active   meloxicam (MOBIC) 7.5 MG tablet [id number] Yes Take 1 tablet (7.5 mg total) by mouth daily. Ann Held, DO Taking Active   metFORMIN (GLUCOPHAGE) 500 MG tablet [id number] Yes Take 1 tablet (500 mg total) by mouth daily with breakfast. Carollee Herter, Alferd Apa, DO Taking Active   ofloxacin (FLOXIN OTIC) 0.3 % OTIC solution [id number] Yes Place 10 drops into both ears daily. Ann Held, DO Taking Active             Patient Active Problem List   Diagnosis Date Noted  Diabetic polyneuropathy associated with type 2 diabetes mellitus ([place]) 09/20/2020   Pincer nail deformity 08/17/2020   Statin intolerance 06/03/2020   DDD (degenerative disc disease), thoracolumbar 10/06/2019   Uncontrolled type 2 diabetes mellitus with hyperglycemia ([place]) 10/06/2019   Hyperlipidemia associated with type 2 diabetes mellitus ([place]) 10/06/2019   Cerumen impaction 09/21/2018   Impacted cerumen of both ears 09/16/2018   Lower extremity edema 07/24/2018   DM (diabetes mellitus) type II uncontrolled, periph vascular disorder ([place]) 06/02/2018   Acute left-sided low back pain without sciatica 05/08/2018   Hyperlipidemia 01/29/2017   Colon cancer screening 10/21/2014   Dysuria 03/17/2014   Primary hypertension 03/05/2014   Routine general medical examination at a health care facility 12/07/2013   Elevated blood pressure 12/07/2013   Frequent urination at night 12/07/2013   Nonspecific abnormal electrocardiogram (ECG) (EKG) 12/07/2013   CANDIDIASIS, VULVAR 05/12/2010   UTI 05/04/2010   DEGENERATIVE JOINT DISEASE 09/22/2008   Dysphagia 09/22/2008   Disorder of bone and cartilage 11/05/2007   TELANGIECTASIA 05/29/2007   HYPERCHOLESTEROLEMIA 02/01/2007   PULMONARY FIBROSIS 02/01/2007   SARCOIDOSIS 11/20/2006   Asthma  11/20/2006    Immunization History  Administered Date(s) Administered   PFIZER(Purple Top)SARS-COV-2 Vaccination 06/01/2019, 06/23/2019   Td 03/19/2002    Conditions to be addressed/monitored: HTN, HLD, DMII, and osteopenia; neuropathy; asthma  Care Plan : General Pharmacy (Adult)  Updates made by Cherre Robins, PHARMD since 11/11/2020 12:00 AM     Problem: Pharmacy Care Plan - Medication and Chronic Care Management   Priority: High  Onset Date: 09/01/2020     Long-Range Goal: Patient-Specific Goal   Start Date: 09/01/2020  This Visit's Progress: Not on track  Recent Progress: Not on track  Priority: High  Note:   Current Barriers:  Unable to independently afford treatment regimen Unable to achieve control of Hyperlipidemia  Unable to maintain control of type 2 DM and HTN Does not adhere to prescribed medication regimen  Pharmacist Clinical Goal(s):  Over the next 120 days, patient will verbalize ability to afford treatment regimen achieve control of hyperlipidemia as evidenced by LDL <100 maintain control of HTN and type 2 DM as evidenced by BP <140/90 and A1c < 7.0%  adhere to prescribed medication regimen as evidenced by fill history and attainment of goals for chronic conditions  through collaboration with PharmD and provider.   Interventions: 1:1 collaboration with Carollee Herter, Alferd Apa, DO regarding development and update of comprehensive plan of care as evidenced by provider attestation and co-signature Inter-disciplinary care team collaboration (see longitudinal plan of care) Comprehensive medication review performed; medication list updated in electronic medical record  Hypertension Improved; last office BP at goal; BP goal <140/90  BP Readings from Last 3 Encounters:  09/20/20 112/86  05/27/20 (!) 150/96  10/05/19 140/78  Home BP readings: 128/78; 142/82; 120/77 Denies symptoms of hypotension - no dizziness  Current regimen:  Amlodipine 5 mg daily   Hydrochlorothiazide 25 mg daily Interventions: Reviewed blood pressure goals Continue current regimen for HTN Continue to check blood pressure daily; document, and provide at future appointments  Hyperlipidemia Not at goal; LDL goal <100, Total Cholesterol (CHOL) goal <200 (since unable to take statins - prevention of upward trend in lipids also a goal) Current regimen:  None currently Patient is intolerant to statin medications - tried Livalo, atorvastatin; pravastatin, rosuvastatin and simvastatin - all eventually caused myalgias Also unable to tolerate ezetimibe or fenofibrate - also caused myalgias Repatha recommended in past but was not started due  to cost  Reviewed patient's formulary. Both Repatha and praluent are on her formulary but are tier 3 - $47 / month and she also would have to meet $95 deductibel which means first fill of the year would be $142 - patient states she cannot afford this Screened for LIS but patient income above limit Checked with [employer] and patient does not qualify for their discount card and there is no patient assistance program. Usually will try to get funding from [employer] but currently no funding for [employer] for hyperlipidemia [employer] does have a patient assistance program and will start process for appying. Interventions: Discussed importance of LDL goal Will discuss trying Praluent with PCP, if OK will try to get through patient assistance program Discussed limiting intake of saturated fat and cholesterol in diet.    Diabetes Improved and A1c now 7.0%;  A1c goal <7% Current regimen:  Metformin 500 mg daily Jardiance 16m daily (not taking due to cost)  Patient tried metformin 5027mtwice daily but caused nausea. She also states that she thinks metformin sometimes causes her to have skin breakouts. Will hold for a few day and will improve.   She would like to start Jardiance but states cost is barrier. She brought  in application in July. Looks like was faxed but not communication received from PAP.  Previously checked insurance plan. Has a $95 deductible to meet for branded medications, so first fill of Jardiance would be $95 + $47 = $142. Then cost would be $47 per month until reaches coverage gap. She is agreeable to $47 but cannot afford the deductible. Will see if she might be able to enroll in manufacturer's assistance program but often patient will no be approved for only deductible cost issues.  Recent home BG readings:  Ranges from 97 to 144 Interventions: Discussed the importance of limiting carbohydrates (45 grams per meal) Contacted [employer] program for Jardiance. They received application but has not been process yet due to missing information - no license number for provider and patient income on application stated was $4$[id number]/[id number] provider information, updated Rx page (needed provider signature) and verified patient income (comma was in the wrong place) and resent application Recommended she continue to check blood sugar 3 to 4 times per week document, and provide at future appointments Contact provider with any episodes of hypoglycemia Future options if unable to get assistance with Jardiance are: low dose pioglitazone - 15 - 3043maily; glimepiride 1 to 2 mg daily or try to get assistance with [place] program for Farxiga.  Asthma Controlled;  goal: reduce symptoms of asthma and prevent exacerbations No recent exacerbations Using albuterol about twice per week - usually when humid outside Has not been taking maintenance inhaler for several months Current regimen:  Albuterol HFA 108 mcg 2 puffs every 6 hours as needed  Patient self care activities - Over the next 90 days, patient will: Continue current therapy for asthma control Contact provider if using albuterol more than twice per week. May need to restart Flovent inhaler  Chronic Pain/Neuropathy Controlled; goals:  reduce  symptoms of pain/neuropathy Per patient neuropath improved on it own without starting duloxetine Current regimen:  Duloxetine 74m29mily (patient never started)  Interventions: Discussed duloxetine and neuropathy - OK to not take if she is not having pain Continue to follow up with Dr WagoJacqualyn Posey primary care provider; Contact provider if neuropathy returns or worsens to discuss treatment.   Medication management Current pharmacy: [facility]  Interventions Comprehensive medication review performed. Continue current medication management strategy Working with patient on cost solutions for diabetes and hyperlipidemia medications  Patient Goals/Self-Care Activities Over the next 120 days, patient will:  take medications as prescribed, check glucose 3 to 4 times per week, document, and provide at future appointments, check blood pressure daily, document, and provide at future appointments, and engage in dietary modifications by limiting intake of sugar / carbohydrates and saturated fat  Follow Up Plan:  Pharmacy team will follow up in  7 to 10 days to check on patient assistance application  .      Medication Assistance:  Resent application with updated informaiton to  Jardiance patient assistance.  Will also apply for Praluent PAP if approved by PCP/   Patient's preferred pharmacy is:  [facility] [place], [address] [place] [address] Phone: [phone] Fax: [phone]   Follow Up:  Patient agrees to Care Plan and Follow-up.  Plan:  Pharmacy team to check PAP application in 7 to 10 days.  F/U with clinical pharmacist in 1 month.   Cherre Robins, PharmD Clinical Pharmacist [facility] [phone]

## 2020-11-13 NOTE — Telephone Encounter (Signed)
Patient returned call - see CCM visit notes

## 2020-11-15 ENCOUNTER — Ambulatory Visit: Payer: Medicare Other | Admitting: Podiatry

## 2020-11-18 ENCOUNTER — Ambulatory Visit (INDEPENDENT_AMBULATORY_CARE_PROVIDER_SITE_OTHER): Payer: Medicare Other | Admitting: Pharmacist

## 2020-11-18 DIAGNOSIS — E1165 Type 2 diabetes mellitus with hyperglycemia: Secondary | ICD-10-CM

## 2020-11-18 DIAGNOSIS — E785 Hyperlipidemia, unspecified: Secondary | ICD-10-CM

## 2020-11-18 DIAGNOSIS — E1169 Type 2 diabetes mellitus with other specified complication: Secondary | ICD-10-CM

## 2020-11-18 NOTE — Patient Instructions (Signed)
Visit Information  PATIENT GOALS:  Goals Addressed             This Visit's Progress    [place] Plan       CARE PLAN ENTRY (see longitudinal plan of care for additional care plan information)  Current Barriers:  Chronic Disease Management support, education, and care coordination needs related to Asthma, Diabetes, Hypertension, Hyperlipidemia, Chronic Pain, Osteopenia   Hypertension BP Readings from Last 3 Encounters:  09/20/20 112/86  05/27/20 (!) 150/96  10/05/19 140/78  Pharmacist Clinical Goal(s): Over the next 90 days, patient will work with PharmD and providers to maintain BP goal <140/90 Current regimen:  Amlodipine 5 mg daily  Hydrochlorothiazide 25 mg daily Interventions: Requested patient to record blood pressure when checking daily Patient self care activities - Over the next 90 days, patient will: Check blood pressure daily, document, and provide at future appointments Ensure daily salt intake < 2300 mg/day  Hyperlipidemia Lipid Panel     Component Value Date/Time   CHOL 314 (H) 09/20/2020 1102   TRIG 156.0 (H) 09/20/2020 1102   HDL 56.10 09/20/2020 1102   CHOLHDL 6 09/20/2020 1102   VLDL 31.2 09/20/2020 1102   LDLCALC 227 (H) 09/20/2020 1102   LDLDIRECT 230.7 03/18/2013 0805  Pharmacist Clinical Goal(s): Over the next 180 days, patient will work with PharmD and providers to achieve LDL goal <100, Total Cholesterol (CHOL) goal <200 or prevent continue upward trend Current regimen:  None currently Interventions: Has not been able to tolerate statin medications in past Discussed importance of LDL goal Patient self care activities - Over the next 180 days, patient will: Reviewed ways to reduce dietary fat an dcholesterol intake in diet   Diabetes Lab Results  Component Value Date/Time   HGBA1C 7.0 (H) 09/20/2020 11:02 AM   HGBA1C 7.3 (H) 05/27/2020 09:20 AM  Pharmacist Clinical Goal(s): Over the next 90 days, patient  will work with PharmD and providers to maintain A1c goal <7% Current regimen:  Metformin XR 500 mg daily Jardiance '10mg'$  daily (not taking due to cost)  Interventions: Discussed the importance of limiting carbohydrates (45 grams per meal) Contacted BI Care program regarding Jardiance. Patient did not meet program requirements Consulting with PCP about substituting Farxiga '5mg'$  daily for Jardiance. If approved will complete application for patient assistance for Farxiga.  Coordinated refills for lancets with [facility] Patient self care activities - Over the next 90 days, patient will: Check blood sugar 1 to 2 times per week document, and provide at future appointments Contact provider with any episodes of hypoglycemia Pick up prescription for lancets at [place] Goal(s) Over the next 90 days, patient will work with PharmD and providers to reduce symptoms of asthma Current regimen:  Albuterol HFA 108 mcg 2 puffs every 6 hours as needed  Patient self care activities - Over the next 90 days, patient will: Continue current therapy for asthma control Contact provider if using albuterol more than twice per week. May need to restart Flovent inhaler  Chronic Pain/Neuropathy Pharmacist Clinical Goal(s) Over the next 90 days, patient will work with PharmD and providers to reduce symptoms of pain/neuropathy Current regimen:  Duloxetine '30mg'$  daily (patient never started)  Interventions: Discussed duloxetine and neuropathy - patient reported neuropathy has improved without needing to start duloxetine Patient self care activities - Over the next 90 days, patient will: Continue to follow up with Dr Jacqualyn Posey and primary care provider; Contact provider if neuropathy returns or worsens  to discuss treatment.   Medication management Pharmacist Clinical Goal(s): Over the next 90 days, patient will work with PharmD and providers to achieve optimal medication adherence Current  pharmacy: [facility] Interventions Comprehensive medication review performed. Continue current medication management strategy Patient self care activities - Over the next 90 days, patient will: Focus on medication adherence by filling and taking medications appropriately  Take medications as prescribed Report any questions or concerns to PharmD and/or provider(s) [place] over the counter medication benefits at [phone]. You should get $50 per quarter to use for calcium and vitamin D supplements and other non prescription medications.    Please see past updates related to this goal by clicking on the "Past Updates" button in the selected goal          The patient verbalized understanding of instructions, educational materials, and care plan provided today and declined offer to receive copy of patient instructions, educational materials, and care plan.   Telephone follow up appointment with care management team member scheduled for: 14 days  Cherre Robins, PharmD Clinical Pharmacist [facility]

## 2020-11-18 NOTE — Chronic Care Management (AMB) (Signed)
Chronic Care Management Pharmacy Note  11/18/2020 Name:  Felicia Martin MRN:  [id number] DOB:  12-Jan-1933  Summary: Patient did not meet BI Cares assistance program requirements.  LDL not at goal - intolerant to statins.   Recommendations/Changes made from today's visit: Consulting with PCP about substituting [place] for [place]. If recommendation accepted, will try for patient assistance for Fraxiga 76m daily since her income does meet their limit of < 300% of FPL. Discussed Praluent to lower cholesterol, patient declined because it is an injection.   Subjective: Felicia KEPPLEis an 87y.o. year old female who is a primary patient of LAnn Held DO.  The CCM team was consulted for assistance with disease management and care coordination needs.    Engaged with patient by telephone for follow up visit in response to provider referral for pharmacy case management and/or care coordination services.   Consent to Services:  The patient was given information about Chronic Care Management services, agreed to services, and gave verbal consent prior to initiation of services.  Please see initial visit note for detailed documentation.   Patient Care Team: LCarollee Herter YAlferd Apa DO as PCP - General (Family Medicine) KInda Castle MD (Inactive) as Consulting Physician (Gastroenterology) ECherre Robins PharmD (Pharmacist)  Recent office visits: 09/20/2020 - PCP YRoma Schanz DO F/U DM and hyperlipidemia. No med changes. A1c improved but LDL still above goal. Referral to lipid clinic per lab notes.  05/27/2020 YRoma Schanz DO (PCP) General follow up. Referral to podiatry. Labs ordered. Follow up in 6 months  Recent consult visits: 08/17/2020 - Podiatry (Dr MPrudence Davidson At risk foot care; Mechanical debridement of toenails on both feet.  06/13/2020 MCelesta Gentile DPM (Podiatry) Diabetic foot evaluation. Started  Cymbalta 30 mg. Follow up in 3 months.  Hospital  visits: None in previous 6 months  Objective:  Lab Results  Component Value Date   CREATININE 0.81 09/20/2020   CREATININE 0.84 05/27/2020   CREATININE 0.81 10/05/2019    Lab Results  Component Value Date   HGBA1C 7.0 (H) 09/20/2020   Last diabetic Eye exam: No results found for: HMDIABEYEEXA  Last diabetic Foot exam: No results found for: HMDIABFOOTEX      Component Value Date/Time   CHOL 314 (H) 09/20/2020 1102   TRIG 156.0 (H) 09/20/2020 1102   HDL 56.10 09/20/2020 1102   CHOLHDL 6 09/20/2020 1102   VLDL 31.2 09/20/2020 1102   LDLCALC 227 (H) 09/20/2020 1102   LDLDIRECT 230.7 03/18/2013 0805    Hepatic Function Latest Ref Rng & Units 09/20/2020 05/27/2020 10/05/2019  Total Protein 6.0 - 8.3 g/dL 8.1 8.0 7.9  Albumin 3.5 - 5.2 g/dL 4.3 3.9 4.2  AST 0 - 37 U/L '15 15 14  ' ALT 0 - 35 U/L '9 8 7  ' Alk Phosphatase 39 - 117 U/L 62 65 63  Total Bilirubin 0.2 - 1.2 mg/dL 0.5 0.4 0.4  Bilirubin, Direct 0.0 - 0.3 mg/dL - - -    Lab Results  Component Value Date/Time   TSH 2.19 09/29/2009 03:47 PM   TSH 1.71 08/18/2007 09:14 AM    CBC Latest Ref Rng & Units 12/16/2013 08/06/2012 05/28/2011  WBC 4.0 - 10.5 K/uL 4.4 5.3 4.6  Hemoglobin 12.0 - 15.0 g/dL 12.2 12.5 12.0  Hematocrit 36.0 - 46.0 % 36.7 37.2 35.9(L)  Platelets 150.0 - 400.0 K/uL 279.0 326.0 271.0    Lab Results  Component Value Date/Time   VD25OH 39 09/29/2009 10:48  PM   VD25OH 27 (L) 09/28/2008 09:02 PM    Clinical ASCVD: No  The ASCVD Risk score Mikey Bussing DC Jr., et al., 2013) failed to calculate for the following reasons:   The 2013 ASCVD risk score is only valid for ages 82 to 70     Social History   Tobacco Use  Smoking Status Former   Packs/day: 0.50   Years: 40.00   Pack years: 20.00   Types: Cigarettes   Quit date: 05/25/1999   Years since quitting: 21.5  Smokeless Tobacco Never   BP Readings from Last 3 Encounters:  09/20/20 112/86  05/27/20 (!) 150/96  10/05/19 140/78   Pulse Readings from  Last 3 Encounters:  09/20/20 83  05/27/20 78  10/05/19 71   Wt Readings from Last 3 Encounters:  11/10/20 212 lb (96.2 kg)  09/20/20 212 lb 9.6 oz (96.4 kg)  05/27/20 216 lb 6.4 oz (98.2 kg)    Assessment: Review of patient past medical history, allergies, medications, health status, including review of consultants reports, laboratory and other test data, was performed as part of comprehensive evaluation and provision of chronic care management services.   SDOH:  (Social Determinants of Health) assessments and interventions performed:     CCM Care Plan  Allergies  Allergen Reactions   Fenofibrate Rash   Statins Other (See Comments)    Muscle aches and weakness   Zetia [Ezetimibe] Other (See Comments)    Muscle pain    Medications Reviewed Today     Reviewed by Cherre Robins, PharmD (Pharmacist) on 11/18/20 at [place] List Status: <None>   Medication Order Taking? Sig Documenting Provider Last Dose Status Informant  albuterol (VENTOLIN HFA) 108 (90 Base) MCG/ACT inhaler [id number] Yes Inhale 2 puffs into the lungs every 6 (six) hours as needed. Carollee Herter, Kendrick Fries R, DO Taking Active   amLODipine (NORVASC) 5 MG tablet [id number] Yes TAKE 1 TABLET(5 MG) BY MOUTH DAILY Roma Schanz R, DO Taking Active   Blood Glucose Monitoring Suppl (ONETOUCH VERIO) w/Device KIT [id number] Yes Use as directed once a day.  Dx code: E11.9 Ann Held, DO Taking Active   DULoxetine (CYMBALTA) 30 MG capsule [id number] No Take 1 capsule (30 mg total) by mouth daily.  Patient not taking: Reported on 11/18/2020   Trula Slade, DPM Not Taking Active   empagliflozin (JARDIANCE) 10 MG TABS tablet [id number] No Take 1 tablet (10 mg total) by mouth daily before breakfast.  Patient not taking: Reported on 11/18/2020   Ann Held, DO Not Taking Active   glucose blood [facility] VERIO) test strip [id number] Yes Use as instructed once a day.  Dx Code: E11.9 Ann Held, DO  Taking Active   hydrochlorothiazide (HYDRODIURIL) 25 MG tablet [id number] Yes Take 1 tablet (25 mg total) by mouth daily. Ann Held, DO Taking Active   Lancets (ONETOUCH DELICA PLUS RCVKFM40R) [place] [id number] Yes USE ONCE A DAY Ann Held, DO Taking Active   meloxicam (MOBIC) 7.5 MG tablet [id number] Yes Take 1 tablet (7.5 mg total) by mouth daily. Ann Held, DO Taking Active   metFORMIN (GLUCOPHAGE) 500 MG tablet [id number] Yes Take 1 tablet (500 mg total) by mouth daily with breakfast. Carollee Herter, Alferd Apa, DO Taking Active   ofloxacin (FLOXIN OTIC) 0.3 % OTIC solution [id number] Yes Place 10 drops into both ears daily. Ann Held, DO Taking Active  Patient Active Problem List   Diagnosis Date Noted   Diabetic polyneuropathy associated with type 2 diabetes mellitus ([place]) 09/20/2020   Pincer nail deformity 08/17/2020   Statin intolerance 06/03/2020   DDD (degenerative disc disease), thoracolumbar 10/06/2019   Uncontrolled type 2 diabetes mellitus with hyperglycemia ([place]) 10/06/2019   Hyperlipidemia associated with type 2 diabetes mellitus ([place]) 10/06/2019   Cerumen impaction 09/21/2018   Impacted cerumen of both ears 09/16/2018   Lower extremity edema 07/24/2018   DM (diabetes mellitus) type II uncontrolled, periph vascular disorder ([place]) 06/02/2018   Acute left-sided low back pain without sciatica 05/08/2018   Hyperlipidemia 01/29/2017   Colon cancer screening 10/21/2014   Dysuria 03/17/2014   Primary hypertension 03/05/2014   Routine general medical examination at a health care facility 12/07/2013   Elevated blood pressure 12/07/2013   Frequent urination at night 12/07/2013   Nonspecific abnormal electrocardiogram (ECG) (EKG) 12/07/2013   CANDIDIASIS, VULVAR 05/12/2010   UTI 05/04/2010   DEGENERATIVE JOINT DISEASE 09/22/2008   Dysphagia 09/22/2008   Disorder of bone and cartilage 11/05/2007   TELANGIECTASIA 05/29/2007    HYPERCHOLESTEROLEMIA 02/01/2007   PULMONARY FIBROSIS 02/01/2007   SARCOIDOSIS 11/20/2006   Asthma 11/20/2006    Immunization History  Administered Date(s) Administered   PFIZER(Purple Top)SARS-COV-2 Vaccination 06/01/2019, 06/23/2019   Td 03/19/2002    Conditions to be addressed/monitored: HTN, HLD, DMII, and osteopenia; neuropathy; asthma  Care Plan : General Pharmacy (Adult)  Updates made by Cherre Robins, PHARMD since 11/18/2020 12:00 AM     Problem: Pharmacy Care Plan - Medication and Chronic Care Management   Priority: High  Onset Date: 09/01/2020     Long-Range Goal: Patient-Specific Goal   Start Date: 09/01/2020  Recent Progress: Not on track  Priority: High  Note:   Current Barriers:  Unable to independently afford treatment regimen Unable to achieve control of Hyperlipidemia  Unable to maintain control of type 2 DM and HTN Does not adhere to prescribed medication regimen  Pharmacist Clinical Goal(s):  Over the next 120 days, patient will verbalize ability to afford treatment regimen achieve control of hyperlipidemia as evidenced by LDL <100 maintain control of HTN and type 2 DM as evidenced by BP <140/90 and A1c < 7.0%  adhere to prescribed medication regimen as evidenced by fill history and attainment of goals for chronic conditions  through collaboration with PharmD and provider.   Interventions: 1:1 collaboration with Carollee Herter, Alferd Apa, DO regarding development and update of comprehensive plan of care as evidenced by provider attestation and co-signature Inter-disciplinary care team collaboration (see longitudinal plan of care) Comprehensive medication review performed; medication list updated in electronic medical record  Hypertension Improved; last office BP at goal; BP goal <140/90  BP Readings from Last 3 Encounters:  09/20/20 112/86  05/27/20 (!) 150/96  10/05/19 140/78  Home BP readings: 128/78; 142/82; 120/77 Denies symptoms of hypotension -  no dizziness  Current regimen:  Amlodipine 5 mg daily  Hydrochlorothiazide 25 mg daily Interventions: (addressed at previous visits)  Reviewed blood pressure goals Continue current regimen for HTN Continue to check blood pressure daily; document, and provide at future appointments  Hyperlipidemia Not at goal; LDL goal <100, Total Cholesterol (CHOL) goal <200 (since unable to take statins - prevention of upward trend in lipids also a goal) Current regimen:  None currently Patient is intolerant to statin medications - tried Livalo, atorvastatin; pravastatin, rosuvastatin and simvastatin - all eventually caused myalgias Also unable to tolerate ezetimibe or fenofibrate - also caused myalgias  Repatha recommended in past but was not started due to cost  Reviewed patient's formulary. Both Repatha and praluent are on her formulary but are tier 3 - $47 / month and she also would have to meet $95 deductibel which means first fill of the year would be $142 - patient states she cannot afford this Screened for LIS but patient income above limit Checked with [employer] [employer] and patient does not qualify for their discount card and there is no patient assistance program. Usually will try to get funding from [employer] but currently no funding for [employer] for hyperlipidemia [employer] does have a patient assistance program and patinet's PCP OK'd trial of Praluent but when I discussed with patient, she declined due to [place] being an injection.  Interventions: Discussed LDL goal Reviewed diet - recommending  she limit intake of saturated fat and cholesterol in diet.   Diabetes Improved and A1c now 7.0%;  A1c goal <7% Current regimen:  Metformin 500 mg daily Jardiance 67m daily (not taking due to cost)  Patient tried metformin 5038mtwice daily but caused nausea. She also states that she thinks metformin sometimes causes her to have skin breakouts. Will hold for a few day and will  improve.   She would like to start Jardiance but states cost is barrier. She brought in application in July. Looks like was faxed but not communication received from PAP.  Previously checked insurance plan. Has a $95 deductible to meet for branded medications, so first fill of Jardiance would be $95 + $47 = $142. Then cost would be $47 per month until reaches coverage gap. She is agreeable to $47 but cannot afford the deductible.  At last visit we applied for patient assistance. Contact BI Cares program today to check status of PAP application. Representative states patinet's income of over limit. Yearly income must be <250%  of Federal Poverty Level (FPL) and patients is about 264%.  Recent home BG readings - checking about 1 or 2 times per week Ranges from 90's to 150 Patient reports her pharmacy requested RF on lancets last week but has not received OK yet.  Inter[facility]: [facility] the [facility] carbohydrates (45 grams per meal) Recommended she continue to check blood sugar 1 or 2 times per week document, and provide at future appointments Contact provider with any episodes of hypoglycemia Consulted with PCP regarding substitution of Farxiga 49m40maily for Jardiance. If accepted, will try for patient assistance for Fraxiga 49mg39mily since her income does meet their limit of < 300% of FPL.   Asthma Controlled;  goal: reduce symptoms of asthma and prevent exacerbations No recent exacerbations Using albuterol about twice per week - usually when humid outside Has not been taking maintenance inhaler for several months Current regimen:  Albuterol HFA 108 mcg 2 puffs every 6 hours as needed  Patient self care activities - Over the next 90 days, patient will: Continue current therapy for asthma control Contact provider if using albuterol more than twice per week. May need to restart Flovent inhaler  Chronic Pain/Neuropathy Controlled;  goals:  reduce symptoms of pain/neuropathy Per patient neuropath improved on it own without starting duloxetine Current regimen:  Duloxetine 30mg48mly (patient never started)  Interventions: (addressed at previous visit)  Discussed duloxetine and neuropathy - OK to not take if she is not having pain Continue to follow up with Dr WagonJacqualyn Poseyprimary care provider; Contact provider if neuropathy returns or worsens to  discuss treatment.   Medication management Current pharmacy: [facility] Interventions Comprehensive medication review performed. Continue current medication management strategy Working with patient on cost solutions for diabetes and hyperlipidemia medications  Patient Goals/Self-Care Activities Over the next 120 days, patient will:  take medications as prescribed, check glucose 3 to 4 times per week, document, and provide at future appointments, check blood pressure daily, document, and provide at future appointments, and engage in dietary modifications by limiting intake of sugar / carbohydrates and saturated fat  Follow Up Plan:  Pharmacy team will follow up in 14 days to check on patient assistance application  .      Medication Assistance:  Application for Jardiance / BI Cares was declined 11/18/2020  Patient's preferred pharmacy is:  [id number] [address] [place] [address] Phone: [phone] Fax: [phone]   Follow Up:  Patient agrees to Care Plan and Follow-up.  Plan:  Pharmacy team to check PAP application in 14 days.  F/U with clinical pharmacist in 1 month.   Cherre Robins, PharmD Clinical Pharmacist [facility] [phone]

## 2020-11-25 ENCOUNTER — Telehealth: Payer: Medicare Other

## 2020-11-25 NOTE — Chronic Care Management (AMB) (Signed)
Provider OK's switch to [place] '5mg'$  daily. Will coordinate with patient and pharmacy.

## 2020-11-28 ENCOUNTER — Ambulatory Visit: Payer: Medicare Other | Admitting: Pharmacist

## 2020-11-28 DIAGNOSIS — E1165 Type 2 diabetes mellitus with hyperglycemia: Secondary | ICD-10-CM

## 2020-11-28 DIAGNOSIS — E1142 Type 2 diabetes mellitus with diabetic polyneuropathy: Secondary | ICD-10-CM

## 2020-11-28 NOTE — Patient Instructions (Signed)
Visit Information  PATIENT GOALS:  Goals Addressed             This Visit's Progress    [place] Plan       CARE PLAN ENTRY (see longitudinal plan of care for additional care plan information)  Current Barriers:  Chronic Disease Management support, education, and care coordination needs related to Asthma, Diabetes, Hypertension, Hyperlipidemia, Chronic Pain, Osteopenia   Hypertension BP Readings from Last 3 Encounters:  09/20/20 112/86  05/27/20 (!) 150/96  10/05/19 140/78  Pharmacist Clinical Goal(s): Over the next 90 days, patient will work with PharmD and providers to maintain BP goal <140/90 Current regimen:  Amlodipine 5 mg daily  Hydrochlorothiazide 25 mg daily Interventions: Requested patient to record blood pressure when checking daily Patient self care activities - Over the next 90 days, patient will: Check blood pressure daily, document, and provide at future appointments Ensure daily salt intake < 2300 mg/day  Hyperlipidemia Lipid Panel     Component Value Date/Time   CHOL 314 (H) 09/20/2020 1102   TRIG 156.0 (H) 09/20/2020 1102   HDL 56.10 09/20/2020 1102   CHOLHDL 6 09/20/2020 1102   VLDL 31.2 09/20/2020 1102   LDLCALC 227 (H) 09/20/2020 1102   LDLDIRECT 230.7 03/18/2013 0805  Pharmacist Clinical Goal(s): Over the next 180 days, patient will work with PharmD and providers to achieve LDL goal <100, Total Cholesterol (CHOL) goal <200 or prevent continue upward trend Current regimen:  None currently Interventions: Has not been able to tolerate statin medications in past Discussed importance of LDL goal Patient self care activities - Over the next 180 days, patient will: Reviewed ways to reduce dietary fat an dcholesterol intake in diet   Diabetes Lab Results  Component Value Date/Time   HGBA1C 7.0 (H) 09/20/2020 11:02 AM   HGBA1C 7.3 (H) 05/27/2020 09:20 AM  Pharmacist Clinical Goal(s): Over the next 90 days, patient  will work with PharmD and providers to maintain A1c goal <7% Current regimen:  Metformin XR 500 mg daily Jardiance '10mg'$  daily (not taking due to cost)  Interventions: Discussed the importance of limiting carbohydrates (45 grams per meal) Contacted [place] program regarding Jardiance. Patient did not meet program requirements Consulting with PCP about substituting Farxiga '5mg'$  daily for Jardiance. Recommendation approved Started application process for Wilder Glade / [place] and [place] Program Coordinated refills for lancets with [facility] Patient self care activities - Over the next 90 days, patient will: Check blood sugar 1 to 2 times per week document, and provide at future appointments Contact provider with any episodes of hypoglycemia Pick up prescription for Farxiga at [place]) Over the next 90 days, patient will work with PharmD and providers to reduce symptoms of asthma Current regimen:  Albuterol HFA 108 mcg 2 puffs every 6 hours as needed  Patient self care activities - Over the next 90 days, patient will: Continue current therapy for asthma control Contact provider if using albuterol more than twice per week. May need to restart Flovent inhaler  Chronic Pain/Neuropathy Pharmacist Clinical Goal(s) Over the next 90 days, patient will work with PharmD and providers to reduce symptoms of pain/neuropathy Current regimen:  Duloxetine '30mg'$  daily (patient never started)  Interventions: Discussed duloxetine and neuropathy - patient reported neuropathy has improved without needing to start duloxetine Patient self care activities - Over the next 90 days, patient will: Continue to follow up with Dr Jacqualyn Posey and primary care provider; Contact provider if neuropathy returns or  worsens to discuss treatment.   Medication management Pharmacist Clinical Goal(s): Over the next 90 days, patient will work with PharmD and providers to achieve optimal medication  adherence Current pharmacy: [facility] Interventions Comprehensive medication review performed. Continue current medication management strategy Patient self care activities - Over the next 90 days, patient will: Focus on medication adherence by filling and taking medications appropriately  Take medications as prescribed Report any questions or concerns to PharmD and/or provider(s) [place] over the counter medication benefits at [phone]. You should get $50 per quarter to use for calcium and vitamin D supplements and other non prescription medications.    Please see past updates related to this goal by clicking on the "Past Updates" button in the selected goal          The patient verbalized understanding of instructions, educational materials, and care plan provided today and declined offer to receive copy of patient instructions, educational materials, and care plan.   Telephone follow up appointment with care management team member scheduled for: 12/12/2020  Cherre Robins, PharmD Clinical Pharmacist [place] [facility]

## 2020-11-28 NOTE — Chronic Care Management (AMB) (Signed)
Chronic Care Management Pharmacy Note  11/28/2020 Name:  Felicia Martin MRN:  [id number] DOB:  09/13/1932  Summary: Provider agreeable to switching Jardiance to Farxiga 31m daily due to patient was unable to afford Jardiance and she did not qualify for their assistance program.  LDL not at goal - intolerant to statins. Patient has declined Praluent because it is an injection.   Recommendations/Changes made from today's visit: Start Farxiga 560mdaily - [facility] also given to [employer] Starting application for [place] and [place] to get Fa[place] manufacturer program.   Subjective: Felicia GALENTINEs an [age].o. year old female who is a primary patient of LoAnn HeldDO.  The CCM team was consulted for assistance with disease management and care coordination needs.    Engaged with patient by telephone for follow up visit in response to provider referral for pharmacy case management and/or care coordination services.   Consent to Services:  The patient was given information about Chronic Care Management services, agreed to services, and gave verbal consent prior to initiation of services.  Please see initial visit note for detailed documentation.   Patient Care Team: LoCarollee HerterYvAlferd ApaDO as PCP - General (Family Medicine) KaInda CastleMD (Inactive) as Consulting Physician (Gastroenterology) EcCherre RobinsPharmD (Pharmacist)  Recent office visits: 09/20/2020 - PCP YvRoma SchanzDO F/U DM and hyperlipidemia. No med changes. A1c improved but LDL still above goal. Referral to lipid clinic per lab notes.  05/27/2020 YvRoma SchanzDO (PCP) General follow up. Referral to podiatry. Labs ordered. Follow up in 6 months  Recent consult visits: 08/17/2020 - Podiatry (Dr MaPrudence DavidsonAt risk foot care; Mechanical debridement of toenails on both feet.  06/13/2020 MaCelesta GentileDPM (Podiatry)  Diabetic foot evaluation. Started  Cymbalta 30 mg. Follow up in 3 months.  Hospital visits: None in previous 6 months  Objective:  Lab Results  Component Value Date   CREATININE 0.81 09/20/2020   CREATININE 0.84 05/27/2020   CREATININE 0.81 10/05/2019    Lab Results  Component Value Date   HGBA1C 7.0 (H) 09/20/2020   Last diabetic Eye exam: No results found for: HMDIABEYEEXA  Last diabetic Foot exam: No results found for: HMDIABFOOTEX      Component Value Date/Time   CHOL 314 (H) 09/20/2020 1102   TRIG 156.0 (H) 09/20/2020 1102   HDL 56.10 09/20/2020 1102   CHOLHDL 6 09/20/2020 1102   VLDL 31.2 09/20/2020 1102   LDLCALC 227 (H) 09/20/2020 1102   LDLDIRECT 230.7 03/18/2013 0805    Hepatic Function Latest Ref Rng & Units 09/20/2020 05/27/2020 10/05/2019  Total Protein 6.0 - 8.3 g/dL 8.1 8.0 7.9  Albumin 3.5 - 5.2 g/dL 4.3 3.9 4.2  AST 0 - 37 U/L _0 ALT 0 - 35 U/L _1 Alk Phosphatase 39 - 117 U/L 62 65 63  Total Bilirubin 0.2 - 1.2 mg/dL 0.5 0.4 0.4  Bilirubin, Direct 0.0 - 0.3 mg/dL - - -    Lab Results  Component Value Date/Time   TSH 2.19 09/29/2009 03:47 PM   TSH 1.71 08/18/2007 09:14 AM    CBC Latest Ref Rng & Units 12/16/2013 08/06/2012 05/28/2011  WBC 4.0 - 10.5 K/uL 4.4 5.3 4.6  Hemoglobin 12.0 - 15.0 g/dL 12.2 12.5 12.0  Hematocrit 36.0 - 46.0 % 36.7 37.2 35.9(L)  Platelets 150.0 - 400.0 K/uL  279.0 326.0 271.0    Lab Results  Component Value Date/Time   VD25OH 39 09/29/2009 10:48 PM   VD25OH 27 (L) 09/28/2008 09:02 PM    Clinical ASCVD: No  The ASCVD Risk score (Arnett DK, et al., 2019) failed to calculate for the following reasons:   The 2019 ASCVD risk score is only valid for ages 20 to 63     Social History   Tobacco Use  Smoking Status Former   Packs/day: 0.50   Years: 40.00   Pack years: 20.00   Types: Cigarettes   Quit date: 05/25/1999   Years since quitting: 21.5  Smokeless Tobacco Never   BP Readings from Last 3 Encounters:   09/20/20 112/86  05/27/20 (!) 150/96  10/05/19 140/78   Pulse Readings from Last 3 Encounters:  09/20/20 83  05/27/20 78  10/05/19 71   Wt Readings from Last 3 Encounters:  11/10/20 212 lb (96.2 kg)  09/20/20 212 lb 9.6 oz (96.4 kg)  05/27/20 216 lb 6.4 oz (98.2 kg)    Assessment: Review of patient past medical history, allergies, medications, health status, including review of consultants reports, laboratory and other test data, was performed as part of comprehensive evaluation and provision of chronic care management services.   SDOH:  (Social Determinants of Health) assessments and interventions performed:  SDOH Interventions    Flowsheet Row Most Recent Value  SDOH Interventions   Financial Strain Interventions Other (Comment)  [Assessing for manufacturer PAP or alternative treatments that are lower cost]        CCM Care Plan  Allergies  Allergen Reactions   Fenofibrate Rash   Statins Other (See Comments)    Muscle aches and weakness   Zetia [Ezetimibe] Other (See Comments)    Muscle pain    Medications Reviewed Today     Reviewed by Cherre Robins, PharmD (Pharmacist) on 11/28/20 at [phone]  Med List Status: <None>   Medication Order Taking? Sig Documenting Provider Last Dose Status Informant  albuterol (VENTOLIN HFA) 108 (90 Base) MCG/ACT inhaler [id number] Yes Inhale 2 puffs into the lungs every 6 (six) hours as needed. Carollee Herter, Kendrick Fries R, DO Taking Active   amLODipine (NORVASC) 5 MG tablet [id number] Yes TAKE 1 TABLET(5 MG) BY MOUTH DAILY Roma Schanz R, DO Taking Active   Blood Glucose Monitoring Suppl (ONETOUCH VERIO) w/Device KIT [id number] Yes Use as directed once a day.  Dx code: E11.9 Ann Held, DO Taking Active   dapagliflozin propanediol (FARXIGA) 5 MG TABS tablet [id number] Yes Take 5 mg by mouth daily. [provider]  Active   DULoxetine (CYMBALTA) 30 MG capsule [id number] Yes Take 1 capsule (30 mg total) by mouth daily.  Trula Slade, DPM Taking Active   glucose blood (ONETOUCH VERIO) test strip [id number] Yes Use as instructed once a day.  Dx Code: E11.9 Ann Held, DO Taking Active   hydrochlorothiazide (HYDRODIURIL) 25 MG tablet [id number] Yes Take 1 tablet (25 mg total) by mouth daily. Ann Held, DO Taking Active   Lancets (ONETOUCH DELICA PLUS ZDGLOV56E) [place] [id number] Yes USE ONCE A DAY Ann Held, DO Taking Active   meloxicam (MOBIC) 7.5 MG tablet [id number] Yes Take 1 tablet (7.5 mg total) by mouth daily. Roma Schanz R, DO Taking Active   metFORMIN (GLUCOPHAGE) 500 MG tablet [id number] Yes Take 1 tablet (500 mg total) by mouth daily with breakfast. Carollee Herter, Alferd Apa, DO Taking Active   ofloxacin (FLOXIN OTIC)  0.3 % OTIC solution [id number] Yes Place 10 drops into both ears daily. Ann Held, DO Taking Active             Patient Active Problem List   Diagnosis Date Noted   Diabetic polyneuropathy associated with type 2 diabetes mellitus ([place]) 09/20/2020   Pincer nail deformity 08/17/2020   Statin intolerance 06/03/2020   DDD (degenerative disc disease), thoracolumbar 10/06/2019   Uncontrolled type 2 diabetes mellitus with hyperglycemia ([place]) 10/06/2019   Hyperlipidemia associated with type 2 diabetes mellitus ([place]) 10/06/2019   Cerumen impaction 09/21/2018   Impacted cerumen of both ears 09/16/2018   Lower extremity edema 07/24/2018   DM (diabetes mellitus) type II uncontrolled, periph vascular disorder ([place]) 06/02/2018   Acute left-sided low back pain without sciatica 05/08/2018   Hyperlipidemia 01/29/2017   Colon cancer screening 10/21/2014   Dysuria 03/17/2014   Primary hypertension 03/05/2014   Routine general medical examination at a health care facility 12/07/2013   Elevated blood pressure 12/07/2013   Frequent urination at night 12/07/2013   Nonspecific abnormal electrocardiogram (ECG) (EKG) 12/07/2013   CANDIDIASIS, VULVAR  05/12/2010   UTI 05/04/2010   DEGENERATIVE JOINT DISEASE 09/22/2008   Dysphagia 09/22/2008   Disorder of bone and cartilage 11/05/2007   TELANGIECTASIA 05/29/2007   HYPERCHOLESTEROLEMIA 02/01/2007   PULMONARY FIBROSIS 02/01/2007   SARCOIDOSIS 11/20/2006   Asthma 11/20/2006    Immunization History  Administered Date(s) Administered   PFIZER(Purple Top)SARS-COV-2 Vaccination 06/01/2019, 06/23/2019   Td 03/19/2002    Conditions to be addressed/monitored: HTN, HLD, DMII, and osteopenia; neuropathy; asthma  Care Plan : General Pharmacy (Adult)  Updates made by Cherre Robins, PHARMD since 11/28/2020 12:00 AM     Problem: Pharmacy Care Plan - Medication and Chronic Care Management   Priority: High  Onset Date: 09/01/2020     Long-Range Goal: Patient-Specific Goal   Start Date: 09/01/2020  Recent Progress: Not on track  Priority: High  Note:   Current Barriers:  Unable to independently afford treatment regimen Unable to achieve control of Hyperlipidemia  Unable to maintain control of type 2 DM and HTN Does not adhere to prescribed medication regimen  Pharmacist Clinical Goal(s):  Over the next 120 days, patient will verbalize ability to afford treatment regimen achieve control of hyperlipidemia as evidenced by LDL <100 maintain control of HTN and type 2 DM as evidenced by BP <140/90 and A1c < 7.0%  adhere to prescribed medication regimen as evidenced by fill history and attainment of goals for chronic conditions  through collaboration with PharmD and provider.   Interventions: 1:1 collaboration with Carollee Herter, Alferd Apa, DO regarding development and update of comprehensive plan of care as evidenced by provider attestation and co-signature Inter-disciplinary care team collaboration (see longitudinal plan of care) Comprehensive medication review performed; medication list updated in electronic medical record  Hypertension Improved; last office BP at goal; BP goal  <140/90  BP Readings from Last 3 Encounters:  09/20/20 112/86  05/27/20 (!) 150/96  10/05/19 140/78  Home BP readings: 128/78; 142/82; 120/77 Denies symptoms of hypotension - no dizziness  Current regimen:  Amlodipine 5 mg daily  Hydrochlorothiazide 25 mg daily Interventions: (addressed at previous visits)  Reviewed blood pressure goals Continue current regimen for HTN Continue to check blood pressure daily; document, and provide at future appointments  Hyperlipidemia Not at goal; LDL goal <100, Total Cholesterol (CHOL) goal <200 (since unable to take statins - prevention of upward trend in lipids also a goal) Current regimen:  None currently Patient is intolerant to statin medications - tried Livalo, atorvastatin; pravastatin, rosuvastatin and simvastatin - all eventually caused myalgias Also unable to tolerate ezetimibe or fenofibrate - also caused myalgias Repatha recommended in past but was not started due to cost  Reviewed patient's formulary. Both Repatha and praluent are on her formulary but are tier 3 - $47 / month and she also would have to meet $95 deductibel which means first fill of the year would be $142 - patient states she cannot afford this Screened for LIS but patient income above limit Checked with Rep[employer] and patient does not qualify for their discount card and there is no patient assistance program. Usually will try to get funding from [employer] but currently no funding for [employer] for hyperlipidemia [employer] does have a patient assistance program and patinet's PCP OK'd trial of Praluent but when I discussed with patient, she declined due to [place] being an injection.  Interventions: (addressed at previous visit) Discussed LDL goal Reviewed diet - recommending  she limit intake of saturated fat and cholesterol in diet.   Diabetes Improved and A1c now 7.0%;  A1c goal <7% Current regimen:  Metformin 500 mg daily Jardiance 17m  daily (not taking due to cost)  Patient tried metformin 5039mtwice daily but caused nausea. She also states that she thinks metformin sometimes causes her to have skin breakouts. Will hold for a few day and will improve.   She would like to start Jardiance but states cost is barrier. She brought in application in July. Looks like was faxed but not communication received from PAP.  Previously checked insurance plan. Has a $95 deductible to meet for branded medications, so first fill of Jardiance would be $95 + $47 = $142. Then cost would be $47 per month until reaches coverage gap. She is agreeable to $47 but cannot afford the deductible.  At last visit we applied for patient assistance. Contact [place] [employer] program today to check status of PAP application. Representative states patinet's income of over limit. Yearly income must be <250%  of Federal Poverty Level (FPL) and patients is about 264%.  Recent home BG readings - checking about 1 or 2 times per week Ranges from 90's to 150 Patient reports her pharmacy requested RF on lancets last week but has not received OK yet.  Interventions: [facility] Discussed the importance of limiting carbohydrates (45 grams per meal) Recommended she continue to check blood sugar 1 or 2 times per week document, and provide at future appointments Contact provider with any episodes of hypoglycemia Consulted provider about starting FaWilder Gladen place of Jardiance and recommendation was accepted.  [facility] Farxiga 63m20m tablet daily #30. Also [facility] day [facility] for medication assistance for Farxiga through [place] Program.   Asthma Controlled;  goal: reduce symptoms of asthma and prevent exacerbations No recent exacerbations Using albuterol about twice per week - usually when humid outside Has not been taking maintenance inhaler for several  months Current regimen:  Albuterol HFA 108 mcg 2 puffs every 6 hours as needed  Interventions:  Continue current therapy for asthma control Contact provider if using albuterol more than twice per week. May need to restart Flovent inhaler  Chronic Pain/Neuropathy Controlled; goals:  reduce symptoms of pain/neuropathy Per patient neuropath improved on it own without starting duloxetine Current regimen:  Duloxetine 105m64mily (  patient never started)  Interventions: (addressed at previous visit)  Discussed duloxetine and neuropathy - OK to not take if she is not having pain Continue to follow up with Dr Jacqualyn Posey and primary care provider; Contact provider if neuropathy returns or worsens to discuss treatment.   Medication management Current pharmacy: [facility] Interventions Comprehensive medication review performed. Continue current medication management strategy Working with patient on cost solutions for diabetes and hyperlipidemia medications  Patient Goals/Self-Care Activities Over the next 120 days, patient will:  take medications as prescribed, check glucose 3 to 4 times per week, document, and provide at future appointments, check blood pressure daily, document, and provide at future appointments, and engage in dietary modifications by limiting intake of sugar / carbohydrates and saturated fat  Follow Up Plan:  Pharmacy team will follow up in 14 days to check on medication assistance application and home blood glucose readings .      Medication Assistance:  Application for Jardiance / [place] [employer] was declined 11/18/2020 . Starting application for Wilder Glade / [place] and [place] program.   Patient's preferred pharmacy is:  [id number] [address], [place] [address] Phone: [phone] Fax: [phone]   Follow Up:  Patient agrees to Care Plan and Follow-up.  Plan:  clinical  pharmacist in 14 days to check PCP application and follow up blood glucose after starting Farxiga.   Cherre Robins, PharmD Clinical Pharmacist [facility] [phone]

## 2020-11-30 ENCOUNTER — Ambulatory Visit: Payer: Medicare Other | Admitting: Podiatry

## 2020-12-05 ENCOUNTER — Telehealth: Payer: Medicare Other

## 2020-12-06 ENCOUNTER — Ambulatory Visit: Payer: Medicare Other | Admitting: Pharmacist

## 2020-12-06 ENCOUNTER — Telehealth: Payer: Self-pay | Admitting: Family Medicine

## 2020-12-06 DIAGNOSIS — E1165 Type 2 diabetes mellitus with hyperglycemia: Secondary | ICD-10-CM

## 2020-12-06 DIAGNOSIS — I10 Essential (primary) hypertension: Secondary | ICD-10-CM

## 2020-12-06 NOTE — Telephone Encounter (Signed)
Pt. Called in and stated that the medication that was prescribed to her is still too expensive. Its still ringing up over $140. She would like to discuss this medication with your further to see about what to do moving forward.She would like for you to give her a call.

## 2020-12-06 NOTE — Telephone Encounter (Cosign Needed)
Chronic Care Management Pharmacy Note  12/06/2020 Name:  Felicia Martin MRN:  [id number] DOB:  [birth year]  Summary: Patient called stating that Wilder Glade cost was too expensive. Last week [facility] was provided info to use to process [place] with a 30 day free coupon from manufacturer.  We have applied for patient assistance application for Wilder Glade is currently in process.    Recommendations/Changes made from today's visit: Called [facility] to assist in processing 30 day free trial card for [place].   Plan:  Subjective: Felicia Martin is an 87 y.o. year old female who is a primary patient of Ann Held, DO.  The CCM team was consulted for assistance with disease management and care coordination needs.    Collaboration with patient and pharmacy  for follow up visit in response to provider referral for pharmacy case management and/or care coordination services.   Consent to Services:  The patient was given information about Chronic Care Management services, agreed to services, and gave verbal consent prior to initiation of services.  Please see initial visit note for detailed documentation.   Patient Care Team: Carollee Herter, Alferd Apa, DO as PCP - General (Family Medicine) Inda Castle, MD (Inactive) as Consulting Physician (Gastroenterology) Cherre Robins, PharmD (Pharmacist)  Objective:  Lab Results  Component Value Date   CREATININE 0.81 09/20/2020   CREATININE 0.84 05/27/2020   CREATININE 0.81 10/05/2019    Lab Results  Component Value Date   HGBA1C 7.0 (H) 09/20/2020   Last diabetic Eye exam: No results found for: HMDIABEYEEXA  Last diabetic Foot exam: No results found for: HMDIABFOOTEX      Component Value Date/Time   CHOL 314 (H) 09/20/2020 1102   TRIG 156.0 (H) 09/20/2020 1102   HDL 56.10 09/20/2020 1102   CHOLHDL 6 09/20/2020 1102   VLDL 31.2 09/20/2020 1102   LDLCALC 227 (H) 09/20/2020 1102   LDLDIRECT 230.7 03/18/2013 0805     Hepatic Function Latest Ref Rng & Units 09/20/2020 05/27/2020 10/05/2019  Total Protein 6.0 - 8.3 g/dL 8.1 8.0 7.9  Albumin 3.5 - 5.2 g/dL 4.3 3.9 4.2  AST 0 - 37 U/L _0 ALT 0 - 35 U/L _1 Alk Phosphatase 39 - 117 U/L 62 65 63  Total Bilirubin 0.2 - 1.2 mg/dL 0.5 0.4 0.4  Bilirubin, Direct 0.0 - 0.3 mg/dL - - -    Lab Results  Component Value Date/Time   TSH 2.19 09/29/2009 03:47 PM   TSH 1.71 08/18/2007 09:14 AM    CBC Latest Ref Rng & Units 12/16/2013 08/06/2012 05/28/2011  WBC 4.0 - 10.5 K/uL 4.4 5.3 4.6  Hemoglobin 12.0 - 15.0 g/dL 12.2 12.5 12.0  Hematocrit 36.0 - 46.0 % 36.7 37.2 35.9(L)  Platelets 150.0 - 400.0 K/uL 279.0 326.0 271.0    Lab Results  Component Value Date/Time   VD25OH 39 09/29/2009 10:48 PM   VD25OH 27 (L) 09/28/2008 09:02 PM    Clinical ASCVD: No  The ASCVD Risk score (Arnett DK, et al., 2019) failed to calculate for the following reasons:   The 2019 ASCVD risk score is only valid for ages 69 to 73     Social History   Tobacco Use  Smoking Status Former   Packs/day: 0.50   Years: 40.00   Pack years: 20.00   Types: Cigarettes   Quit date: 05/25/1999   Years since quitting: 21.5  Smokeless Tobacco Never   BP Readings from Last 3 Encounters:  09/20/20 112/86  05/27/20 (!) 150/96  10/05/19 140/78   Pulse Readings from Last 3 Encounters:  09/20/20 83  05/27/20 78  10/05/19 71   Wt Readings from Last 3 Encounters:  11/10/20 212 lb (96.2 kg)  09/20/20 212 lb 9.6 oz (96.4 kg)  05/27/20 216 lb 6.4 oz (98.2 kg)    Assessment: Review of patient past medical history, allergies, medications, health status, including review of consultants reports, laboratory and other test data, was performed as part of comprehensive evaluation and provision of chronic care management services.   SDOH:  (Social Determinants of Health) assessments and interventions performed:  _0 @  CCM Care Plan  Allergies  Allergen Reactions    Fenofibrate Rash   Statins Other (See Comments)    Muscle aches and weakness   Zetia [Ezetimibe] Other (See Comments)    Muscle pain    _1 @  Patient Active Problem List   Diagnosis Date Noted   Diabetic polyneuropathy associated with type 2 diabetes mellitus ([place]) 09/20/2020   Pincer nail deformity 08/17/2020   Statin intolerance 06/03/2020   DDD (degenerative disc disease), thoracolumbar 10/06/2019   Uncontrolled type 2 diabetes mellitus with hyperglycemia ([place]) 10/06/2019   Hyperlipidemia associated with type 2 diabetes mellitus ([place]) 10/06/2019   Cerumen impaction 09/21/2018   Impacted cerumen of both ears 09/16/2018   Lower extremity edema 07/24/2018   DM (diabetes mellitus) type II uncontrolled, periph vascular disorder ([place]) 06/02/2018   Acute left-sided low back pain without sciatica 05/08/2018   Hyperlipidemia 01/29/2017   Colon cancer screening 10/21/2014   Dysuria 03/17/2014   Primary hypertension 03/05/2014   Routine general medical examination at a health care facility 12/07/2013   Elevated blood pressure 12/07/2013   Frequent urination at night 12/07/2013   Nonspecific abnormal electrocardiogram (ECG) (EKG) 12/07/2013   CANDIDIASIS, VULVAR 05/12/2010   UTI 05/04/2010   DEGENERATIVE JOINT DISEASE 09/22/2008   Dysphagia 09/22/2008   Disorder of bone and cartilage 11/05/2007   TELANGIECTASIA 05/29/2007   HYPERCHOLESTEROLEMIA 02/01/2007   PULMONARY FIBROSIS 02/01/2007   SARCOIDOSIS 11/20/2006   Asthma 11/20/2006    Immunization History  Administered Date(s) Administered   PFIZER(Purple Top)SARS-COV-2 Vaccination 06/01/2019, 06/23/2019   Td 03/19/2002    Conditions to be addressed/monitored: HTN, HLD, DMII, and osteopenia, neuropathy, asthma  Care Plan : General Pharmacy (Adult)  Updates made by Cherre Robins, PHARMD since 11/28/2020 12:00 AM       Problem: Pharmacy Care Plan - Medication and Chronic Care Management    Priority: High  Onset  Date: 09/01/2020       Long-Range Goal: Patient-Specific Goal    Start Date: 09/01/2020  Recent Progress: Not on track  Priority: High  Note:   Current Barriers:  Unable to independently afford treatment regimen Unable to achieve control of Hyperlipidemia  Unable to maintain control of type 2 DM and HTN Does not adhere to prescribed medication regimen   Pharmacist Clinical Goal(s):  Over the next 120 days, patient will verbalize ability to afford treatment regimen achieve control of hyperlipidemia as evidenced by LDL <100 maintain control of HTN and type 2 DM as evidenced by BP <140/90 and A1c < 7.0%  adhere to prescribed medication regimen as evidenced by fill history and attainment of goals for chronic conditions  through collaboration with PharmD and provider.    Interventions: 1:1 collaboration with Carollee Herter, Alferd Apa, DO regarding development and update of comprehensive plan of care as evidenced by provider attestation and co-signature Inter-disciplinary care team collaboration (see longitudinal  plan of care) Comprehensive medication review performed; medication list updated in electronic medical record   Hypertension Improved; last office BP at goal; BP goal <140/90      BP Readings from Last 3 Encounters:  09/20/20 112/86  05/27/20 (!) 150/96  10/05/19 140/78  Home BP readings: 128/78; 142/82; 120/77 Denies symptoms of hypotension - no dizziness  Current regimen:  Amlodipine 5 mg daily  Hydrochlorothiazide 25 mg daily Interventions: (addressed at previous visits)  Reviewed blood pressure goals Continue current regimen for HTN Continue to check blood pressure daily; document, and provide at future appointments   Hyperlipidemia Not at goal; LDL goal <100, Total Cholesterol (CHOL) goal <200 (since unable to take statins - prevention of upward trend in lipids also a goal) Current regimen:  None currently Patient is intolerant to statin medications - tried Livalo,  atorvastatin; pravastatin, rosuvastatin and simvastatin - all eventually caused myalgias Also unable to tolerate ezetimibe or fenofibrate - also caused myalgias Repatha recommended in past but was not started due to cost  Reviewed patient's formulary. Both Repatha and praluent are on her formulary but are tier 3 - $47 / month and she also would have to meet $95 deductibel which means first fill of the year would be $142 - patient states she cannot afford this Screened for LIS but patient income above limit Checked with [employer] and patient does not qualify for their discount card and there is no patient assistance program. Usually will try to get funding from [employer] but currently no funding for [employer] for hyperlipidemia [employer] does have a patient assistance program and patinet's PCP OK'd trial of Praluent but when I discussed with patient, she declined due to Ruston being an injection.  Interventions: (addressed at previous visit) Discussed LDL goal Reviewed diet - recommending  she limit intake of saturated fat and cholesterol in diet.    Diabetes Improved and A1c now 7.0%;  A1c goal <7% Current regimen:  Metformin 500 mg daily Jardiance 54m daily (not taking due to cost)  Patient tried metformin 5047mtwice daily but caused nausea. She also states that she thinks metformin sometimes causes her to have skin breakouts. Will hold for a few day and will improve.   She would like to start Jardiance but states cost is barrier. She brought in application in July. Looks like was faxed but not communication received from PAP.  Previously checked insurance plan. Has a $95 deductible to meet for branded medications, so first fill of Jardiance would be $95 + $47 = $142. Then cost would be $47 per month until reaches coverage gap. She is agreeable to $47 but cannot afford the deductible.  At last visit we applied for patient assistance. Contact [employer] program today  to check status of PAP application. Representative states patinet's income of over limit. Yearly income must be <250%  of Federal Poverty Level (FPL) and patients is about 264%.  Recent home BG readings - checking about 1 or 2 times per week Ranges from 90's to 150 Patient reports her pharmacy requested RF on lancets last week but has not received OK yet.  Interventions: Recommended she continue to check blood sugar 1 or 2 times per week document, and provide at future appointments Contact provider with any episodes of hypoglycemia Coordinated with [facility] to process free trial card for Fa[place]Completed process and [facility]; confirmed $0. They will reach out to patient about refunding what she paid for Farxiga ($1$202Application for edication assistance for  Farxiga through [facility] and Me Program in process.    Asthma Controlled;  goal: reduce symptoms of asthma and prevent exacerbations No recent exacerbations Using albuterol about twice per week - usually when humid outside Has not been taking maintenance inhaler for several months Current regimen:  Albuterol HFA 108 mcg 2 puffs every 6 hours as needed  Interventions:  Continue current therapy for asthma control Contact provider if using albuterol more than twice per week. May need to restart Flovent inhaler   Chronic Pain/Neuropathy Controlled; goals:  reduce symptoms of pain/neuropathy Per patient neuropath improved on it own without starting duloxetine Current regimen:  Duloxetine 77m daily (patient never started)  Interventions: (addressed at previous visit)  Discussed duloxetine and neuropathy - OK to not take if she is not having pain Continue to follow up with Dr WJacqualyn Poseyand primary care provider; Contact provider if neuropathy returns or worsens to discuss treatment.    Medication management Current pharmacy: [facility] Interventions Comprehensive medication review performed. Continue current medication management  strategy Working with patient on cost solutions for diabetes medications   Patient Goals/Self-Care Activities Over the next 120 days, patient will:  take medications as prescribed, check glucose 3 to 4 times per week, document, and provide at future appointments, check blood pressure daily, document, and provide at future appointments, and engage in dietary modifications by limiting intake of sugar / carbohydrates and saturated fat   Follow Up Plan:  Pharmacy team will follow up in 7 days to check on medication assistance application and home blood glucose readings .         Medication Assistance:  jardiance application was denied. Sent Farxiga application last week - pending.   [facility] [id number]: [phone]: [phone]  Follow Up:  Patient agrees to Care Plan and Follow-up.  Plan: Telephone follow up appointment with care management team member scheduled for:  7 to 10 days  TCherre Robins PharmD Clinical Pharmacist LE[facility] PLLCPrimary Care [facility] MRollingwoodHEncompass [facility]

## 2020-12-06 NOTE — Chronic Care Management (AMB) (Signed)
Chronic Care Management Pharmacy Note   12/06/2020 Name:  Felicia Martin    MRN:  [id number]      DOB:  1932-12-01   Summary: Patient called stating that Wilder Glade cost was too expensive. Last week [facility] was provided info to use to process [place] with a 30 day free coupon from manufacturer.  We have applied for patient assistance application for Wilder Glade is currently in process.     Recommendations/Changes made from today's visit: Called [facility] to assist in processing 30 day free trial card for [place].  Free trial card processed and per [facility] patient will be refunded $140. Patient notified.    Plan: Follow up Farxiga PAP application in 7 to 10 days.    Subjective: Felicia Martin is an 87 y.o. year old female who is a primary patient of Ann Held, DO.  The CCM team was consulted for assistance with disease management and care coordination needs.     Collaboration with patient and pharmacy  for follow up visit in response to provider referral for pharmacy case management and/or care coordination services.    Consent to Services:  The patient was given information about Chronic Care Management services, agreed to services, and gave verbal consent prior to initiation of services.  Please see initial visit note for detailed documentation.    Patient Care Team: Carollee Herter, Alferd Apa, DO as PCP - General (Family Medicine) Inda Castle, MD (Inactive) as Consulting Physician (Gastroenterology) Cherre Robins, PharmD (Pharmacist)   Objective:   Recent Labs       Lab Results  Component Value Date    CREATININE 0.81 09/20/2020    CREATININE 0.84 05/27/2020    CREATININE 0.81 10/05/2019        Recent Labs       Lab Results  Component Value Date    HGBA1C 7.0 (H) 09/20/2020      Last diabetic Eye exam:  Labs (Brief)  No results found for: HMDIABEYEEXA    Last diabetic Foot exam:  Labs (Brief)  No results found for: HMDIABFOOTEX       Labs (Brief)          Component Value Date/Time    CHOL 314 (H) 09/20/2020 1102    TRIG 156.0 (H) 09/20/2020 1102    HDL 56.10 09/20/2020 1102    CHOLHDL 6 09/20/2020 1102    VLDL 31.2 09/20/2020 1102    LDLCALC 227 (H) 09/20/2020 1102    LDLDIRECT 230.7 03/18/2013 0805        Hepatic Function Latest Ref Rng & Units 09/20/2020 05/27/2020 10/05/2019  Total Protein 6.0 - 8.3 g/dL 8.1 8.0 7.9  Albumin 3.5 - 5.2 g/dL 4.3 3.9 4.2  AST 0 - 37 U/L '15 15 14  ' ALT 0 - 35 U/L '9 8 7  ' Alk Phosphatase 39 - 117 U/L 62 65 63  Total Bilirubin 0.2 - 1.2 mg/dL 0.5 0.4 0.4  Bilirubin, Direct 0.0 - 0.3 mg/dL - - -      Labs (Brief)       Lab Results  Component Value Date/Time    TSH 2.19 09/29/2009 03:47 PM    TSH 1.71 08/18/2007 09:14 AM        CBC Latest Ref Rng & Units 12/16/2013 08/06/2012 05/28/2011  WBC 4.0 - 10.5 K/uL 4.4 5.3 4.6  Hemoglobin 12.0 - 15.0 g/dL 12.2 12.5 12.0  Hematocrit 36.0 - 46.0 % 36.7 37.2 35.9(L)  Platelets 150.0 - 400.0 K/uL  279.0 326.0 271.0      Labs (Brief)       Lab Results  Component Value Date/Time    VD25OH 39 09/29/2009 10:48 PM    VD25OH 27 (L) 09/28/2008 09:02 PM        Clinical ASCVD: No  The ASCVD Risk score (Arnett DK, et al., 2019) failed to calculate for the following reasons:   The 2019 ASCVD risk score is only valid for ages 26 to 2       Social History        Tobacco Use  Smoking Status Former   Packs/day: 0.50   Years: 40.00   Pack years: 20.00   Types: Cigarettes   Quit date: 05/25/1999   Years since quitting: 21.5  Smokeless Tobacco Never       BP Readings from Last 3 Encounters:  09/20/20 112/86  05/27/20 (!) 150/96  10/05/19 140/78       Pulse Readings from Last 3 Encounters:  09/20/20 83  05/27/20 78  10/05/19 71       Wt Readings from Last 3 Encounters:  11/10/20 212 lb (96.2 kg)  09/20/20 212 lb 9.6 oz (96.4 kg)  05/27/20 216 lb 6.4 oz (98.2 kg)      Assessment: Review of patient past medical history,  allergies, medications, health status, including review of consultants reports, laboratory and other test data, was performed as part of comprehensive evaluation and provision of chronic care management services.    SDOH:  (Social Determinants of Health) assessments and interventions performed:  '@SDOHINTERVENTIONS' @   [place]        Allergies  Allergen Reactions   Fenofibrate Rash   Statins Other (See Comments)      Muscle aches and weakness   Zetia [Ezetimibe] Other (See Comments)      Muscle pain      '@THNMEDREV' @       Patient Active Problem List    Diagnosis Date Noted   Diabetic polyneuropathy associated with type 2 diabetes mellitus ([place]) 09/20/2020   Pincer nail deformity 08/17/2020   Statin intolerance 06/03/2020   DDD (degenerative disc disease), thoracolumbar 10/06/2019   Uncontrolled type 2 diabetes mellitus with hyperglycemia ([place]) 10/06/2019   Hyperlipidemia associated with type 2 diabetes mellitus ([place]) 10/06/2019   Cerumen impaction 09/21/2018   Impacted cerumen of both ears 09/16/2018   Lower extremity edema 07/24/2018   DM (diabetes mellitus) type II uncontrolled, periph vascular disorder ([place]) 06/02/2018   Acute left-sided low back pain without sciatica 05/08/2018   Hyperlipidemia 01/29/2017   Colon cancer screening 10/21/2014   Dysuria 03/17/2014   Primary hypertension 03/05/2014   Routine general medical examination at a health care facility 12/07/2013   Elevated blood pressure 12/07/2013   Frequent urination at night 12/07/2013   Nonspecific abnormal electrocardiogram (ECG) (EKG) 12/07/2013   CANDIDIASIS, VULVAR 05/12/2010   UTI 05/04/2010   DEGENERATIVE JOINT DISEASE 09/22/2008   Dysphagia 09/22/2008   Disorder of bone and cartilage 11/05/2007   TELANGIECTASIA 05/29/2007   HYPERCHOLESTEROLEMIA 02/01/2007   PULMONARY FIBROSIS 02/01/2007   SARCOIDOSIS 11/20/2006   Asthma 11/20/2006          Immunization History  Administered Date(s)  Administered   PFIZER(Purple Top)SARS-COV-2 Vaccination 06/01/2019, 06/23/2019   Td 03/19/2002      Conditions to be addressed/monitored: HTN, HLD, DMII, and osteopenia, neuropathy, asthma   Care Plan : General Pharmacy (Adult)  Updates made by Cherre Robins, PHARMD since 11/28/2020 12:00 AM  Problem: Pharmacy Care Plan - Medication and Chronic Care Management    Priority: High  Onset Date: 09/01/2020       Long-Range Goal: Patient-Specific Goal    Start Date: 09/01/2020  Recent Progress: Not on track  Priority: High  Note:   Current Barriers:  Unable to independently afford treatment regimen Unable to achieve control of Hyperlipidemia  Unable to maintain control of type 2 DM and HTN Does not adhere to prescribed medication regimen   Pharmacist Clinical Goal(s):  Over the next 120 days, patient will verbalize ability to afford treatment regimen achieve control of hyperlipidemia as evidenced by LDL <100 maintain control of HTN and type 2 DM as evidenced by BP <140/90 and A1c < 7.0%  adhere to prescribed medication regimen as evidenced by fill history and attainment of goals for chronic conditions  through collaboration with PharmD and provider.    Interventions: 1:1 collaboration with Carollee Herter, Alferd Apa, DO regarding development and update of comprehensive plan of care as evidenced by provider attestation and co-signature Inter-disciplinary care team collaboration (see longitudinal plan of care) Comprehensive medication review performed; medication list updated in electronic medical record   Hypertension Improved; last office BP at goal; BP goal <140/90        BP Readings from Last 3 Encounters:  09/20/20 112/86  05/27/20 (!) 150/96  10/05/19 140/78  Home BP readings: 128/78; 142/82; 120/77 Denies symptoms of hypotension - no dizziness  Current regimen:  Amlodipine 5 mg daily  Hydrochlorothiazide 25 mg daily Interventions: (addressed at previous visits)   Reviewed blood pressure goals Continue current regimen for HTN Continue to check blood pressure daily; document, and provide at future appointments   Hyperlipidemia Not at goal; LDL goal <100, Total Cholesterol (CHOL) goal <200 (since unable to take statins - prevention of upward trend in lipids also a goal) Current regimen:  None currently Patient is intolerant to statin medications - tried Livalo, atorvastatin; pravastatin, rosuvastatin and simvastatin - all eventually caused myalgias Also unable to tolerate ezetimibe or fenofibrate - also caused myalgias Repatha recommended in past but was not started due to cost  Reviewed patient's formulary. Both Repatha and praluent are on her formulary but are tier 3 - $47 / month and she also would have to meet $95 deductibel which means first fill of the year would be $142 - patient states she cannot afford this Screened for LIS but patient income above limit Checked with Repatha [employer] and patient does not qualify for their discount card and there is no patient assistance program. Usually will try to get funding from [employer] but currently no funding for [employer] for hyperlipidemia [facility] does have a patient assistance program and patinet's PCP OK'd trial of Praluent but when I discussed with patient, she declined due to [place] being an injection.  Interventions: (addressed at previous visit) Discussed LDL goal Reviewed diet - recommending  she limit intake of saturated fat and cholesterol in diet.    Diabetes Improved and A1c now 7.0%;  A1c goal <7% Current regimen:  Metformin 500 mg daily Jardiance 68m daily (not taking due to cost)  Patient tried metformin 5032mtwice daily but caused nausea. She also states that she thinks metformin sometimes causes her to have skin breakouts. Will hold for a few day and will improve.   She would like to start Jardiance but states cost is barrier. She brought in application in  July. Looks like was faxed but not communication received from PAP.  Previously checked insurance plan. Has a $95 deductible to meet for branded medications, so first fill of Jardiance would be $95 + $47 = $142. Then cost would be $47 per month until reaches coverage gap. She is agreeable to $47 but cannot afford the deductible.  At last visit we applied for patient assistance. Contact [employer] program today to check status of PAP application. Representative states patinet's income of over limit. Yearly income must be <250%  of Federal Poverty Level (FPL) and patients is about 264%.  Recent home BG readings - checking about 1 or 2 times per week Ranges from 90's to 150 Patient reports her pharmacy requested RF on lancets last week but has not received OK yet.  Interventions: Recommended she continue to check blood sugar 1 or 2 times per week document, and provide at future appointments Contact provider with any episodes of hypoglycemia Coordinated with [facility] to process free trial card for [place]. Completed process and [facility]; confirmed $0. They will reach out to patient about refunding what she paid for Farxiga ($621) Application for edication assistance for Farxiga through [place] and Me Program in process.    Asthma Controlled;  goal: reduce symptoms of asthma and prevent exacerbations No recent exacerbations Using albuterol about twice per week - usually when humid outside Has not been taking maintenance inhaler for several months Current regimen:  Albuterol HFA 108 mcg 2 puffs every 6 hours as needed  Interventions:  Continue current therapy for asthma control Contact provider if using albuterol more than twice per week. May need to restart Flovent inhaler   Chronic Pain/Neuropathy Controlled; goals:  reduce symptoms of pain/neuropathy Per patient neuropath improved on it own without starting duloxetine Current regimen:  Duloxetine 64m daily (patient never started)  Interventions:  (addressed at previous visit)  Discussed duloxetine and neuropathy - OK to not take if she is not having pain Continue to follow up with Dr WJacqualyn Poseyand primary care provider; Contact provider if neuropathy returns or worsens to discuss treatment.    Medication management [facility] Interventions Comprehensive medication review performed. Continue current medication management strategy Working with patient on cost solutions for diabetes medications   Patient Goals/Self-Care Activities Over the next 120 days, patient will:  take medications as prescribed, check glucose 3 to 4 times per week, document, and provide at future appointments, check blood pressure daily, document, and provide at future appointments, and engage in dietary modifications by limiting intake of sugar / carbohydrates and saturated fat   Follow Up Plan:  Pharmacy team will follow up in 7 days to check on medication assistance application and home blood glucose readings .           Medication Assistance:  jardiance application was denied. Sent Farxiga application last week - pending.    [facility] [id number] [id number]: [phone]: [phone]    Follow Up:  Patient agrees to Care Plan and Follow-up.   Plan: Telephone follow up appointment with care management team member scheduled for:  7 to 10 days   TCherre Robins PharmD Clinical Pharmacist [facility]

## 2020-12-06 NOTE — Patient Instructions (Signed)
Visit Information  PATIENT GOALS:  Goals Addressed             This Visit's Progress    Center Plan       CARE PLAN ENTRY (see longitudinal plan of care for additional care plan information)  Current Barriers:  Chronic Disease Management support, education, and care coordination needs related to Asthma, Diabetes, Hypertension, Hyperlipidemia, Chronic Pain, Osteopenia   Hypertension BP Readings from Last 3 Encounters:  09/20/20 112/86  05/27/20 (!) 150/96  10/05/19 140/78  Pharmacist Clinical Goal(s): Over the next 90 days, patient will work with PharmD and providers to maintain BP goal <140/90 Current regimen:  Amlodipine 5 mg daily  Hydrochlorothiazide 25 mg daily Interventions: Requested patient to record blood pressure when checking daily Patient self care activities - Over the next 90 days, patient will: Check blood pressure daily, document, and provide at future appointments Ensure daily salt intake < 2300 mg/day  Hyperlipidemia Lipid Panel     Component Value Date/Time   CHOL 314 (H) 09/20/2020 1102   TRIG 156.0 (H) 09/20/2020 1102   HDL 56.10 09/20/2020 1102   CHOLHDL 6 09/20/2020 1102   VLDL 31.2 09/20/2020 1102   LDLCALC 227 (H) 09/20/2020 1102   LDLDIRECT 230.7 03/18/2013 0805  Pharmacist Clinical Goal(s): Over the next 180 days, patient will work with PharmD and providers to achieve LDL goal <100, Total Cholesterol (CHOL) goal <200 or prevent continue upward trend Current regimen:  None currently Interventions: Has not been able to tolerate statin medications in past Discussed importance of LDL goal Patient self care activities - Over the next 180 days, patient will: Reviewed ways to reduce dietary fat an dcholesterol intake in diet   Diabetes Lab Results  Component Value Date/Time   HGBA1C 7.0 (H) 09/20/2020 11:02 AM   HGBA1C 7.3 (H) 05/27/2020 09:20 AM  Pharmacist Clinical Goal(s): Over the next 90 days, patient  will work with PharmD and providers to maintain A1c goal <7% Current regimen:  Metformin XR 500 mg daily Jardiance 10mg  daily (not taking due to cost)  Interventions: Discussed the importance of limiting carbohydrates (45 grams per meal) Contacted [employer] program regarding Jardiance. Patient did not meet program requirements Consulting with PCP about substituting [place] 5mg  daily for Jardiance. Recommendation approved Coordinated with [facility] to process 30 day trial card for [place] Patient self care activities - Over the next 90 days, patient will: Check blood sugar 1 to 2 times per week document, and provide at future appointments Contact provider with any episodes of hypoglycemia Check in with [facility] regarding refund for Farxiga.  Asthma Pharmacist Clinical Goal(s) Over the next 90 days, patient will work with PharmD and providers to reduce symptoms of asthma Current regimen:  Albuterol HFA 108 mcg 2 puffs every 6 hours as needed  Patient self care activities - Over the next 90 days, patient will: Continue current therapy for asthma control Contact provider if using albuterol more than twice per week. May need to restart Flovent inhaler  Chronic Pain/Neuropathy Pharmacist Clinical Goal(s) Over the next 90 days, patient will work with PharmD and providers to reduce symptoms of pain/neuropathy Current regimen:  Duloxetine 30mg  daily (patient never started)  Interventions: Discussed duloxetine and neuropathy - patient reported neuropathy has improved without needing to start duloxetine Patient self care activities - Over the next 90 days, patient will: Continue to follow up with Dr Jacqualyn Posey and primary care provider; Contact provider if neuropathy returns or worsens to discuss treatment.  Medication management Pharmacist Clinical Goal(s): Over the next 90 days, patient will work with PharmD and providers to achieve optimal medication adherence [facility] pharmacy:  [facility] Interventions Comprehensive medication review performed. Continue current medication management strategy Patient self care activities - Over the next 90 days, patient will: Focus on medication adherence by filling and taking medications appropriately  Take medications as prescribed Report any questions or concerns to PharmD and/or provider(s) [place] over the counter medication benefits at [phone]. You should get $50 per quarter to use for calcium and vitamin D supplements and other non prescription medications.    Please see past updates related to this goal by clicking on the "Past Updates" button in the selected goal          The patient verbalized understanding of instructions, educational materials, and care plan provided today and declined offer to receive copy of patient instructions, educational materials, and care plan.   Telephone follow up appointment with care management team member scheduled for: 7 to 10 days  Cherre Robins, PharmD Clinical Pharmacist [facility] Primary Care SW [facility]

## 2020-12-12 ENCOUNTER — Ambulatory Visit: Payer: Medicare Other | Admitting: Pharmacist

## 2020-12-12 DIAGNOSIS — E1142 Type 2 diabetes mellitus with diabetic polyneuropathy: Secondary | ICD-10-CM

## 2020-12-12 DIAGNOSIS — E1169 Type 2 diabetes mellitus with other specified complication: Secondary | ICD-10-CM

## 2020-12-12 DIAGNOSIS — E1165 Type 2 diabetes mellitus with hyperglycemia: Secondary | ICD-10-CM

## 2020-12-12 NOTE — Patient Instructions (Signed)
Visit Information  PATIENT GOALS:  Goals Addressed             This Visit's Progress    [place]       CARE PLAN ENTRY (see longitudinal plan of care for additional care plan information)  Current Barriers:  Chronic Disease Management support, education, and care coordination needs related to Asthma, Diabetes, Hypertension, Hyperlipidemia, Chronic Pain, Osteopenia   Hypertension BP Readings from Last 3 Encounters:  09/20/20 112/86  05/27/20 (!) 150/96  10/05/19 140/78  Pharmacist Clinical Goal(s): Over the next 90 days, patient will work with PharmD and providers to maintain BP goal <140/90 Current regimen:  Amlodipine 5 mg daily  Hydrochlorothiazide 25 mg daily Interventions: Requested patient to record blood pressure when checking daily Patient self care activities - Over the next 90 days, patient will: Check blood pressure daily, document, and provide at future appointments Ensure daily salt intake < 2300 mg/day  Hyperlipidemia Lipid Panel     Component Value Date/Time   CHOL 314 (H) 09/20/2020 1102   TRIG 156.0 (H) 09/20/2020 1102   HDL 56.10 09/20/2020 1102   CHOLHDL 6 09/20/2020 1102   VLDL 31.2 09/20/2020 1102   LDLCALC 227 (H) 09/20/2020 1102   LDLDIRECT 230.7 03/18/2013 0805  Pharmacist Clinical Goal(s): Over the next 180 days, patient will work with PharmD and providers to achieve LDL goal <100, Total Cholesterol (CHOL) goal <200 or prevent continue upward trend Current regimen:  None currently Interventions: Has not been able to tolerate statin medications in past Discussed importance of LDL goal Patient self care activities - Over the next 180 days, patient will: Reviewed ways to reduce dietary fat an dcholesterol intake in diet   Diabetes Lab Results  Component Value Date/Time   HGBA1C 7.0 (H) 09/20/2020 11:02 AM   HGBA1C 7.3 (H) 05/27/2020 09:20 AM  Pharmacist Clinical Goal(s): Over the next 90 days, patient  will work with PharmD and providers to maintain A1c goal <7% Current regimen:  Metformin XR 500 mg daily Farxiga 5mg  daily (not taking due to side effects)  Interventions: Discussed the importance of limiting carbohydrates (45 grams per meal) Contacted [place] and Me program and patient was approved to receive Farxiga thru 03/18/2021 however she has decided not to continue [place].  Patient self care activities - Over the next 90 days, patient will: Check blood sugar 1 to 2 times per week document, and provide at future appointments Contact provider with any episodes of hypoglycemia Stop Farxiga Continue metformin ER 500mg  daily with food  Asthma Pharmacist Clinical Goal(s) Over the next 90 days, patient will work with PharmD and providers to reduce symptoms of asthma Current regimen:  Albuterol HFA 108 mcg 2 puffs every 6 hours as needed  Patient self care activities - Over the next 90 days, patient will: Continue current therapy for asthma control Contact provider if using albuterol more than twice per week. May need to restart Flovent inhaler  Chronic Pain/Neuropathy Pharmacist Clinical Goal(s) Over the next 90 days, patient will work with PharmD and providers to reduce symptoms of pain/neuropathy Current regimen:  Duloxetine 30mg  daily (patient never started)  Interventions: Discussed duloxetine and neuropathy - patient reported neuropathy has improved without needing to start duloxetine Patient self care activities - Over the next 90 days, patient will: Continue to follow up with Dr Jacqualyn Posey and primary care provider; Contact provider if neuropathy returns or worsens to discuss treatment.   Medication management Pharmacist Clinical Goal(s): Over the next  90 days, patient will work with PharmD and providers to achieve optimal medication adherence [facility] Interventions Comprehensive medication review performed. Continue current medication management  strategy Patient self care activities - Over the next 90 days, patient will: Focus on medication adherence by filling and taking medications appropriately  Take medications as prescribed Report any questions or concerns to PharmD and/or provider(s) [place] over the counter medication benefits at [phone]. You should get $50 per quarter to use for calcium and vitamin D supplements and other non prescription medications.    Please see past updates related to this goal by clicking on the "Past Updates" button in the selected goal          The patient verbalized understanding of instructions, educational materials, and care plan provided today and declined offer to receive copy of patient instructions, educational materials, and care plan.   Telephone follow up appointment with care management team member scheduled for: 1 month  Cherre Robins, PharmD Clinical Pharmacist [facility] Primary Care SW [facility]

## 2020-12-12 NOTE — Chronic Care Management (AMB) (Signed)
Chronic Care Management Pharmacy Note   12/06/2020 Name:  Felicia Martin    MRN:  [id number]      DOB:  Sep 18, 1932   Summary: Felicia Martin to notify patient she was approved for [place] [employer] program to received Farxiga for free thru 03/18/2021. However patient states that she experienced frequent urination (which was to be expected and was discussed with patient at last visit) and swelling (not expected side effect)  Blood glucose has been 97 to 135 at home recently   Recommendations/Changes made from today's visit: Stop Farxiga Patient to restart metformin ER 534m daily with food.    Subjective: EANAJAH STERBENZis an 87y.o. year old female who is a primary patient of Felicia Held DO.  The CCM team was consulted for assistance with disease management and care coordination needs.     Collaboration with patient and pharmacy  for follow up visit in response to provider referral for pharmacy case management and/or care coordination services.    Consent to Services:  The patient was given information about Chronic Care Management services, agreed to services, and gave verbal consent prior to initiation of services.  Please see initial visit note for detailed documentation.    Patient Care Team: Felicia Martin YAlferd Apa DO as PCP - General (Family Medicine) KInda Castle MD (Inactive) as Consulting Physician (Gastroenterology) ECherre Robins PharmD (Pharmacist)   Objective:   Recent Labs       Lab Results  Component Value Date    CREATININE 0.81 09/20/2020    CREATININE 0.84 05/27/2020    CREATININE 0.81 10/05/2019        Recent Labs       Lab Results  Component Value Date    HGBA1C 7.0 (H) 09/20/2020      Last diabetic Eye exam:  Labs (Brief)  No results found for: HMDIABEYEEXA    Last diabetic Foot exam:  Labs (Brief)  No results found for: HMDIABFOOTEX      Labs (Brief)          Component Value Date/Time    CHOL 314 (H) 09/20/2020 1102    TRIG  156.0 (H) 09/20/2020 1102    HDL 56.10 09/20/2020 1102    CHOLHDL 6 09/20/2020 1102    VLDL 31.2 09/20/2020 1102    LDLCALC 227 (H) 09/20/2020 1102    LDLDIRECT 230.7 03/18/2013 0805        Hepatic Function Latest Ref Rng & Units 09/20/2020 05/27/2020 10/05/2019  Total Protein 6.0 - 8.3 g/dL 8.1 8.0 7.9  Albumin 3.5 - 5.2 g/dL 4.3 3.9 4.2  AST 0 - 37 U/L '15 15 14  ' ALT 0 - 35 U/L '9 8 7  ' Alk Phosphatase 39 - 117 U/L 62 65 63  Total Bilirubin 0.2 - 1.2 mg/dL 0.5 0.4 0.4  Bilirubin, Direct 0.0 - 0.3 mg/dL - - -      Labs (Brief)       Lab Results  Component Value Date/Time    TSH 2.19 09/29/2009 03:47 PM    TSH 1.71 08/18/2007 09:14 AM        CBC Latest Ref Rng & Units 12/16/2013 08/06/2012 05/28/2011  WBC 4.0 - 10.5 K/uL 4.4 5.3 4.6  Hemoglobin 12.0 - 15.0 g/dL 12.2 12.5 12.0  Hematocrit 36.0 - 46.0 % 36.7 37.2 35.9(L)  Platelets 150.0 - 400.0 K/uL 279.0 326.0 271.0      Labs (Brief)       Lab Results  Component Value Date/Time    VD25OH 39 09/29/2009 10:48 PM    VD25OH 27 (L) 09/28/2008 09:02 PM        Clinical ASCVD: No  The ASCVD Risk score (Arnett DK, et al., 2019) failed to calculate for the following reasons:   The 2019 ASCVD risk score is only valid for ages 23 to 43       Social History        Tobacco Use  Smoking Status Former   Packs/day: 0.50   Years: 40.00   Pack years: 20.00   Types: Cigarettes   Quit date: 05/25/1999   Years since quitting: 21.5  Smokeless Tobacco Never       BP Readings from Last 3 Encounters:  09/20/20 112/86  05/27/20 (!) 150/96  10/05/19 140/78       Pulse Readings from Last 3 Encounters:  09/20/20 83  05/27/20 78  10/05/19 71       Wt Readings from Last 3 Encounters:  11/10/20 212 lb (96.2 kg)  09/20/20 212 lb 9.6 oz (96.4 kg)  05/27/20 216 lb 6.4 oz (98.2 kg)      Assessment: Review of patient past medical history, allergies, medications, health status, including review of consultants reports, laboratory and  other test data, was performed as part of comprehensive evaluation and provision of chronic care management services.    SDOH:  (Social Determinants of Health) assessments and interventions performed:  '@SDOHINTERVENTIONS' @   [place]        Allergies  Allergen Reactions   Fenofibrate Rash   Statins Other (See Comments)      Muscle aches and weakness   Zetia [Ezetimibe] Other (See Comments)      Muscle pain      '@THNMEDREV' @       Patient Active Problem List    Diagnosis Date Noted   Diabetic polyneuropathy associated with type 2 diabetes mellitus ([place]) 09/20/2020   Pincer nail deformity 08/17/2020   Statin intolerance 06/03/2020   DDD (degenerative disc disease), thoracolumbar 10/06/2019   Uncontrolled type 2 diabetes mellitus with hyperglycemia ([place]) 10/06/2019   Hyperlipidemia associated with type 2 diabetes mellitus ([place]) 10/06/2019   Cerumen impaction 09/21/2018   Impacted cerumen of both ears 09/16/2018   Lower extremity edema 07/24/2018   DM (diabetes mellitus) type II uncontrolled, periph vascular disorder ([place]) 06/02/2018   Acute left-sided low back pain without sciatica 05/08/2018   Hyperlipidemia 01/29/2017   Colon cancer screening 10/21/2014   Dysuria 03/17/2014   Primary hypertension 03/05/2014   Routine general medical examination at a health care facility 12/07/2013   Elevated blood pressure 12/07/2013   Frequent urination at night 12/07/2013   Nonspecific abnormal electrocardiogram (ECG) (EKG) 12/07/2013   CANDIDIASIS, VULVAR 05/12/2010   UTI 05/04/2010   DEGENERATIVE JOINT DISEASE 09/22/2008   Dysphagia 09/22/2008   Disorder of bone and cartilage 11/05/2007   TELANGIECTASIA 05/29/2007   HYPERCHOLESTEROLEMIA 02/01/2007   PULMONARY FIBROSIS 02/01/2007   SARCOIDOSIS 11/20/2006   Asthma 11/20/2006          Immunization History  Administered Date(s) Administered   PFIZER(Purple Top)SARS-COV-2 Vaccination 06/01/2019, 06/23/2019   Td 03/19/2002       Conditions to be addressed/monitored: HTN, HLD, DMII, and osteopenia, neuropathy, asthma   Care Plan : General Pharmacy (Adult)  Updates made by Cherre Robins, PHARMD since 12/12/2020 12:00 AM     Problem: Pharmacy Care Plan - Medication and Chronic Care Management   Priority: High  Onset Date: 09/01/2020  Long-Range Goal: Patient-Specific Goal   Start Date: 09/01/2020  Recent Progress: Not on track  Priority: High  Note:   Current Barriers:  Unable to independently afford treatment regimen Unable to achieve control of Hyperlipidemia  Unable to maintain control of type 2 DM and HTN Does not adhere to prescribed medication regimen  Pharmacist Clinical Goal(s):  Over the next 120 days, patient will verbalize ability to afford treatment regimen achieve control of hyperlipidemia as evidenced by LDL <100 maintain control of HTN and type 2 DM as evidenced by BP <140/90 and A1c < 7.0%  adhere to prescribed medication regimen as evidenced by fill history and attainment of goals for chronic conditions  through collaboration with PharmD and provider.   Interventions: 1:1 collaboration with Carollee Martin, Felicia Apa, DO regarding development and update of comprehensive plan of care as evidenced by provider attestation and co-signature Inter-disciplinary care team collaboration (see longitudinal plan of care) Comprehensive medication review performed; medication list updated in electronic medical record  Hypertension Improved; last office BP at goal; BP goal <140/90  BP Readings from Last 3 Encounters:  09/20/20 112/86  05/27/20 (!) 150/96  10/05/19 140/78  Home BP readings: 128/78; 142/82; 120/77 Denies symptoms of hypotension - no dizziness  Current regimen:  Amlodipine 5 mg daily  Hydrochlorothiazide 25 mg daily Interventions: (addressed at previous visits)  Reviewed blood pressure goals Continue current regimen for HTN Continue to check blood pressure daily; document, and  provide at future appointments  Hyperlipidemia Not at goal; LDL goal <100, Total Cholesterol (CHOL) goal <200 (since unable to take statins - prevention of upward trend in lipids also a goal) Current regimen:  None currently Patient is intolerant to statin medications - tried Livalo, atorvastatin; pravastatin, rosuvastatin and simvastatin - all eventually caused myalgias Also unable to tolerate ezetimibe or fenofibrate - also caused myalgias Repatha recommended in past but was not started due to cost  Reviewed patient's formulary. Both Repatha and praluent are on her formulary but are tier 3 - $47 / month and she also would have to meet $95 deductibel which means first fill of the year would be $142 - patient states she cannot afford this Screened for LIS but patient income above limit Checked with Rep[facility] Ready and patient does not qualify for their discount card and there is no patient assistance program. Usually will try to get funding from [employer] but currently no funding for [employer] for hyperlipidemia [employer] does have a patient assistance program and patinet's PCP OK'd trial of Praluent but when I discussed with patient, she declined due to [place] being an injection.  Interventions: (addressed at previous visit) Discussed LDL goal Reviewed diet - recommending  she limit intake of saturated fat and cholesterol in diet.   Diabetes Improved and A1c now 7.0%;  A1c goal <7% Current regimen:  Metformin 500 mg daily Farxiga 26m daily (patient only took 3 doses) Patient tried FIranbut states she does not want to continued due to increased urinary frequency and she also felt FIrancaused her to swell (this is unusual as FRosemary Holmsusually helps with edema)  She wants to restart metformin - though in the past she tried metformin 5059mtwice daily but caused nausea. She has also stated in the past that she thought metformin caused her to breakout but no longer  thinks the metformin caused this. Earlier in 2022 she wanted to try Jardiance but cost was a barrier. She brought in medication assistance application in July but was  denied. Representative stated patient's income of over limit. Yearly income must be <250%  of Federal Poverty Level (FPL) and patients is about 264%. We did complete and submit PAP app for Wilder Glade and I verified it was approved today through 03/18/2021 However will cancel shipment since patient has decided not to take [place].  Previously checked insurance plan. Has a $95 deductible to meet for branded medications, so first fill of Jardiance or Farxiag would be $95 + $47 = $142. Then cost would be $47 per month until reaches coverage gap. She is agreeable to $47 but cannot afford the deductible.    Recent home BG readings - checking about 1 or 2 times per week Ranges from 97 to 135  Interventions: Recommended she continue to check blood sugar 1 or 2 times per week document, and provide at future appointments Contact provider with any episodes of hypoglycemia Coordinate wit [place] [employer] [place] Program to cancel request for medication assistance.  Patient to restart metformin 579m daily with food.   Asthma Controlled;  goal: reduce symptoms of asthma and prevent exacerbations No recent exacerbations Using albuterol about twice per week - usually when humid outside Has not been taking maintenance inhaler for several months Current regimen:  Albuterol HFA 108 mcg 2 puffs every 6 hours as needed  Interventions:  Continue current therapy for asthma control Contact provider if using albuterol more than twice per week. May need to restart Flovent inhaler  Chronic Pain/Neuropathy Controlled; goals:  reduce symptoms of pain/neuropathy Per patient neuropath improved on it own without starting duloxetine Current regimen:  Duloxetine 361mdaily (patient never started)  Interventions: (addressed at previous visit)  Discussed duloxetine and  neuropathy - OK to not take if she is not having pain Continue to follow up with Dr WaJacqualyn Poseynd primary care provider; Contact provider if neuropathy returns or worsens to discuss treatment.   Medication management Current pharmacy: [facility] Interventions Comprehensive medication review performed. Continue current medication management strategy Working with patient on cost solutions for diabetes and hyperlipidemia medications  Patient Goals/Self-Care Activities Over the next 120 days, patient will:  take medications as prescribed, check glucose 3 to 4 times per week, document, and provide at future appointments, check blood pressure daily, document, and provide at future appointments, and engage in dietary modifications by limiting intake of sugar / carbohydrates and saturated fat  Follow Up Plan:  Pharmacy team will follow up in 30 days to check tolerance of metformin and recheck home blood glucose readings .            Medication Assistance:  jardiance application was denied. Sent FaWilder Gladepplication last week - and was approved but patient has decided not to continue FaIran [id number]: [phone]: [phone]   Follow Up:  Patient agrees to Care Plan and Follow-up.   Plan: Telephone follow up appointment with care management team member scheduled for:  30 days   TaCherre RobinsPharmD Clinical Pharmacist [facility]

## 2020-12-14 ENCOUNTER — Other Ambulatory Visit: Payer: Self-pay | Admitting: *Deleted

## 2020-12-14 DIAGNOSIS — I10 Essential (primary) hypertension: Secondary | ICD-10-CM

## 2020-12-14 MED ORDER — AMLODIPINE BESYLATE 5 MG PO TABS: ORAL_TABLET | ORAL | 1 refills | Status: DC

## 2020-12-16 DIAGNOSIS — E785 Hyperlipidemia, unspecified: Secondary | ICD-10-CM

## 2020-12-16 DIAGNOSIS — I10 Essential (primary) hypertension: Secondary | ICD-10-CM | POA: Diagnosis not present

## 2020-12-16 DIAGNOSIS — E1169 Type 2 diabetes mellitus with other specified complication: Secondary | ICD-10-CM | POA: Diagnosis not present

## 2020-12-16 DIAGNOSIS — E1165 Type 2 diabetes mellitus with hyperglycemia: Secondary | ICD-10-CM

## 2020-12-16 DIAGNOSIS — E1142 Type 2 diabetes mellitus with diabetic polyneuropathy: Secondary | ICD-10-CM

## 2020-12-22 LAB — HM DIABETES EYE EXAM

## 2020-12-27 ENCOUNTER — Encounter: Payer: Self-pay | Admitting: *Deleted

## 2021-01-10 ENCOUNTER — Ambulatory Visit (INDEPENDENT_AMBULATORY_CARE_PROVIDER_SITE_OTHER): Payer: Medicare Other | Admitting: Pharmacist

## 2021-01-10 DIAGNOSIS — E785 Hyperlipidemia, unspecified: Secondary | ICD-10-CM

## 2021-01-10 DIAGNOSIS — E1142 Type 2 diabetes mellitus with diabetic polyneuropathy: Secondary | ICD-10-CM

## 2021-01-10 DIAGNOSIS — E1169 Type 2 diabetes mellitus with other specified complication: Secondary | ICD-10-CM

## 2021-01-10 DIAGNOSIS — I10 Essential (primary) hypertension: Secondary | ICD-10-CM

## 2021-01-10 DIAGNOSIS — E1165 Type 2 diabetes mellitus with hyperglycemia: Secondary | ICD-10-CM

## 2021-01-10 NOTE — Chronic Care Management (AMB) (Signed)
Chronic Care Management Pharmacy Note   12/06/2020 Name:  Felicia Martin    MRN:  [id number]      DOB:  [birth year]   Summary: Patient has tolerated restart of metformin and blood glucose has been at goal. Coordinated refill of blood glucose testing supplies for patient.  Coordinated Rx for rolling walker with PCP.  Follow up appt with PCP scheduled.  Discussed recommended immunizations      Subjective: Felicia Martin is an 87 y.o. year old female who is a primary patient of Ann Held, DO.  The CCM team was consulted for assistance with disease management and care coordination needs.     Collaboration with patient and pharmacy  for follow up visit in response to provider referral for pharmacy case management and/or care coordination services.    Consent to Services:  The patient was given information about Chronic Care Management services, agreed to services, and gave verbal consent prior to initiation of services.  Please see initial visit note for detailed documentation.    Patient Care Team: Carollee Herter, Alferd Apa, DO as PCP - General (Family Medicine) Inda Castle, MD (Inactive) as Consulting Physician (Gastroenterology) Cherre Robins, PharmD (Pharmacist)   Objective:   Recent Labs       Lab Results  Component Value Date    CREATININE 0.81 09/20/2020    CREATININE 0.84 05/27/2020    CREATININE 0.81 10/05/2019        Recent Labs       Lab Results  Component Value Date    HGBA1C 7.0 (H) 09/20/2020      Last diabetic Eye exam:  Labs (Brief)  No results found for: HMDIABEYEEXA    Last diabetic Foot exam:  Labs (Brief)  No results found for: HMDIABFOOTEX      Labs (Brief)          Component Value Date/Time    CHOL 314 (H) 09/20/2020 1102    TRIG 156.0 (H) 09/20/2020 1102    HDL 56.10 09/20/2020 1102    CHOLHDL 6 09/20/2020 1102    VLDL 31.2 09/20/2020 1102    LDLCALC 227 (H) 09/20/2020 1102    LDLDIRECT 230.7 03/18/2013 0805         Hepatic Function Latest Ref Rng & Units 09/20/2020 05/27/2020 10/05/2019  Total Protein 6.0 - 8.3 g/dL 8.1 8.0 7.9  Albumin 3.5 - 5.2 g/dL 4.3 3.9 4.2  AST 0 - 37 U/L '15 15 14  ' ALT 0 - 35 U/L '9 8 7  ' Alk Phosphatase 39 - 117 U/L 62 65 63  Total Bilirubin 0.2 - 1.2 mg/dL 0.5 0.4 0.4  Bilirubin, Direct 0.0 - 0.3 mg/dL - - -      Labs (Brief)       Lab Results  Component Value Date/Time    TSH 2.19 09/29/2009 03:47 PM    TSH 1.71 08/18/2007 09:14 AM        CBC Latest Ref Rng & Units 12/16/2013 08/06/2012 05/28/2011  WBC 4.0 - 10.5 K/uL 4.4 5.3 4.6  Hemoglobin 12.0 - 15.0 g/dL 12.2 12.5 12.0  Hematocrit 36.0 - 46.0 % 36.7 37.2 35.9(L)  Platelets 150.0 - 400.0 K/uL 279.0 326.0 271.0      Labs (Brief)       Lab Results  Component Value Date/Time    VD25OH 39 09/29/2009 10:48 PM    VD25OH 27 (L) 09/28/2008 09:02 PM        Clinical ASCVD: No  The ASCVD Risk score (Arnett DK, et al., 2019) failed to calculate for the following reasons:   The 2019 ASCVD risk score is only valid for ages 82 to 53       Social History        Tobacco Use  Smoking Status Former   Packs/day: 0.50   Years: 40.00   Pack years: 20.00   Types: Cigarettes   Quit date: 05/25/1999   Years since quitting: 21.5  Smokeless Tobacco Never       BP Readings from Last 3 Encounters:  09/20/20 112/86  05/27/20 (!) 150/96  10/05/19 140/78       Pulse Readings from Last 3 Encounters:  09/20/20 83  05/27/20 78  10/05/19 71       Wt Readings from Last 3 Encounters:  11/10/20 212 lb (96.2 kg)  09/20/20 212 lb 9.6 oz (96.4 kg)  05/27/20 216 lb 6.4 oz (98.2 kg)      Assessment: Review of patient past medical history, allergies, medications, health status, including review of consultants reports, laboratory and other test data, was performed as part of comprehensive evaluation and provision of chronic care management services.    SDOH:  (Social Determinants of Health) assessments and interventions  performed:  '@SDOHINTERVENTIONS' @   Tool        Allergies  Allergen Reactions   Fenofibrate Rash   Statins Other (See Comments)      Muscle aches and weakness   Zetia [Ezetimibe] Other (See Comments)      Muscle pain      '@THNMEDREV' @       Patient Active Problem List    Diagnosis Date Noted   Diabetic polyneuropathy associated with type 2 diabetes mellitus ([place]) 09/20/2020   Pincer nail deformity 08/17/2020   Statin intolerance 06/03/2020   DDD (degenerative disc disease), thoracolumbar 10/06/2019   Uncontrolled type 2 diabetes mellitus with hyperglycemia ([place]) 10/06/2019   Hyperlipidemia associated with type 2 diabetes mellitus ([place]) 10/06/2019   Cerumen impaction 09/21/2018   Impacted cerumen of both ears 09/16/2018   Lower extremity edema 07/24/2018   DM (diabetes mellitus) type II uncontrolled, periph vascular disorder ([place]) 06/02/2018   Acute left-sided low back pain without sciatica 05/08/2018   Hyperlipidemia 01/29/2017   Colon cancer screening 10/21/2014   Dysuria 03/17/2014   Primary hypertension 03/05/2014   Routine general medical examination at a health care facility 12/07/2013   Elevated blood pressure 12/07/2013   Frequent urination at night 12/07/2013   Nonspecific abnormal electrocardiogram (ECG) (EKG) 12/07/2013   CANDIDIASIS, VULVAR 05/12/2010   UTI 05/04/2010   DEGENERATIVE JOINT DISEASE 09/22/2008   Dysphagia 09/22/2008   Disorder of bone and cartilage 11/05/2007   TELANGIECTASIA 05/29/2007   HYPERCHOLESTEROLEMIA 02/01/2007   PULMONARY FIBROSIS 02/01/2007   SARCOIDOSIS 11/20/2006   Asthma 11/20/2006          Immunization History  Administered Date(s) Administered   PFIZER(Purple Top)SARS-COV-2 Vaccination 06/01/2019, 06/23/2019   Td 03/19/2002      Conditions to be addressed/monitored: HTN, HLD, DMII, and osteopenia, neuropathy, asthma   Care Plan : General Pharmacy (Adult)  Updates made by Cherre Robins, PHARMD since 01/10/2021  12:00 AM     Problem: Pharmacy Care Plan - Medication and Chronic Care Management   Priority: High  Onset Date: 09/01/2020     Long-Range Goal: Patient-Specific Goal   Start Date: 09/01/2020  Recent Progress: Not on track  Priority: High  Note:   Current Barriers:  Unable to independently afford  treatment regimen Unable to achieve control of Hyperlipidemia  Unable to maintain control of type 2 DM and HTN Does not adhere to prescribed medication regimen  Pharmacist Clinical Goal(s):  Over the next 120 days, patient will verbalize ability to afford treatment regimen achieve control of hyperlipidemia as evidenced by LDL <100 maintain control of HTN and type 2 DM as evidenced by BP <140/90 and A1c < 7.0%  adhere to prescribed medication regimen as evidenced by fill history and attainment of goals for chronic conditions  through collaboration with PharmD and provider.   Interventions: 1:1 collaboration with Carollee Herter, Alferd Apa, DO regarding development and update of comprehensive plan of care as evidenced by provider attestation and co-signature Inter-disciplinary care team collaboration (see longitudinal plan of care) Comprehensive medication review performed; medication list updated in electronic medical record  Hypertension Improved; last office BP at goal BP goal <140/90  BP Readings from Last 3 Encounters:  09/20/20 112/86  05/27/20 (!) 150/96  10/05/19 140/78  Home BP readings: 113/70 today  Denies symptoms of hypotension - no dizziness  Current regimen:  Amlodipine 5 mg daily  Hydrochlorothiazide 25 mg daily Interventions: Reviewed blood pressure goals Continue current regimen for hypertension Reviewed refill history - 90 day supply of amlodipine filled 05/27/2020 and 09/01/2020. Per pharmacy they filled 12/11/2020 but patient did not pick up. Discussed adherence with patient. She endorses that she has been taking amlodipine daily and blood pressure has been at goal.  She has somehow gotten ahead on refills.  Will continue to follow adherence.  Continue to check blood pressure daily; document, and provide at future appointments  Hyperlipidemia Not at goal; LDL goal <100, Total Cholesterol (CHOL) goal <200 (since unable to take statins - prevention of upward trend in lipids also a goal) Current regimen:  None currently Patient is intolerant to statin medications - tried Livalo, atorvastatin; pravastatin, rosuvastatin and simvastatin - all eventually caused myalgias Also unable to tolerate ezetimibe or fenofibrate - also caused myalgias Repatha recommended in past but was not started due to cost  Reviewed patient's formulary. Both Repatha and praluent are on her formulary but are tier 3 - $47 / month and she also would have to meet $95 deductibel which means first fill of the year would be $142 - patient states she cannot afford this Screened for LIS but patient income above limit Checked with Repatha Ready and patient does not qualify for their discount card and there is no patient assistance program. Usually will try to get funding from [employer] but currently no funding for [employer] for hyperlipidemia [employer] does have a patient assistance program and patinet's PCP OK'd trial of Praluent but when I discussed with patient, she declined due to [place] being an injection.  Interventions: (addressed at previous visit) Discussed LDL goal Reviewed diet - recommending  she limit intake of saturated fat and cholesterol in diet.   Diabetes Improved and A1c now 7.0%;  A1c goal <7% Current regimen:  Metformin 500 mg daily Earlier in 2022 she wanted to try Jardiance but cost was a barrier. She brought in medication assistance application in July but was denied. Representative stated patient's income of over limit. Yearly income must be <250%  of Federal Poverty Level (FPL) and patients is about 264%. Previously checked insurance plan. Has a  $95 deductible to meet for branded medications, so first fill of Jardiance or Wilder Glade would be $95 + $47 = $142. Then cost would be $47 per month until reaches coverage  gap. She is agreeable to $47 but cannot afford the deductible. 11/2020: Patient tried Wilder Glade but states she does not want to continued due to increased urinary frequency and she also felt [place] caused her to swell (this is unusual as Rosemary Holms usually helps with edema). We did complete and submit PAP app for [place] and I verified it was approved today through 03/18/2021 however no longer needed since patient has stopped [place].  Restarted metformin 11/2020. In the past she tried metformin 531m twice daily but caused nausea and she thought metformin caused her to breakout in rash. Today she reports she has tolerated metformin without side effect over the last month Recent home BG readings - checking about 3 to 4 times per week Ranges from 112 - 143 Interventions: Recommended she continue to check blood sugar 3 to 4 times per week document, and provide at future appointments Coordinated with [facility] to refill test strips Continue metformin 5024mdaily with food.   Asthma Controlled;  goal: reduce symptoms of asthma and prevent exacerbations No recent exacerbations Using albuterol about twice per week - usually when humid outside Has not been taking maintenance inhaler for several months Current regimen:  Albuterol HFA 108 mcg 2 puffs every 6 hours as needed  Interventions:  Continue current therapy for asthma control Contact provider if using albuterol more than twice per week. May need to restart Flovent inhaler  Chronic Pain/Neuropathy Controlled; goals:  reduce symptoms of pain/neuropathy Per patient neuropath improved on it own without starting duloxetine Current regimen:  Duloxetine 3046maily (patient never started)  Interventions: (addressed at previous visit)  Discussed duloxetine and neuropathy - OK to not  take if she is not having pain Continue to follow up with Dr WagJacqualyn Poseyd primary care provider; Contact provider if neuropathy returns or worsens to discuss treatment.    Health Maintenance:  Reviewed vaccination history and discussed benefits of annual flu vaccine, Tdap, Shingrix and Covid vaccinations Patient declined flu and COVID vaccinations. Will consider Tdap and Shingric in 2023 (anticipate better coverage through Medicare in 2023)  Appointment made for follow up with PCP for January 2023 at patient request (sees PCP every 6 months)  Patient also asked about getting rolling walker (no seat or breaks per patient) to use in her home. Will request Rx from PCP (may need to move up appt with PCP)  Medication management Current pharmacy: [facility] Interventions Comprehensive medication review performed. Continue current medication management strategy  Patient Goals/Self-Care Activities Over the next 120 days, patient will:  take medications as prescribed, check glucose 3 to 4 times per week, document, and provide at future appointments, check blood pressure daily, document, and provide at future appointments, and engage in dietary modifications by limiting intake of sugar / carbohydrates and saturated fat  Follow Up Plan:  Follow up in 2 to 3 months .             Medication Assistance:  jardiance application was denied. Sent FarWilder Gladeproved but patient decided 11/2020 to not continue to take Far[place]  [id number] [id number]: [phone]: [phone]  Follow Up:  Patient agrees to Care Plan and Follow-up.   Plan: Telephone follow up appointment with care management team member scheduled for:  60 days (will see PCP in January 2022)  Cherre Robins, PharmD Clinical Pharmacist [facility]

## 2021-01-10 NOTE — Patient Instructions (Signed)
Visit Information  PATIENT GOALS:  Goals Addressed             This Visit's Progress    [place]   On track    [place] (see longitudinal plan of care for additional care plan information)  Current Barriers:  Chronic Disease Management support, education, and care coordination needs related to Asthma, Diabetes, Hypertension, Hyperlipidemia, Chronic Pain, Osteopenia   Hypertension BP Readings from Last 3 Encounters:  09/20/20 112/86  05/27/20 (!) 150/96  10/05/19 140/78  Pharmacist Clinical Goal(s): Over the next 90 days, patient will work with PharmD and providers to maintain BP goal <140/90 Current regimen:  Amlodipine 5 mg daily  Hydrochlorothiazide 25 mg daily Interventions: Requested patient to record blood pressure when checking daily Patient self care activities - Over the next 90 days, patient will: Check blood pressure daily, document, and provide at future appointments Ensure daily salt intake < 2300 mg/day  Hyperlipidemia Lipid Panel     Component Value Date/Time   CHOL 314 (H) 09/20/2020 1102   TRIG 156.0 (H) 09/20/2020 1102   HDL 56.10 09/20/2020 1102   CHOLHDL 6 09/20/2020 1102   VLDL 31.2 09/20/2020 1102   LDLCALC 227 (H) 09/20/2020 1102   LDLDIRECT 230.7 03/18/2013 0805  Pharmacist Clinical Goal(s): Over the next 180 days, patient will work with PharmD and providers to achieve LDL goal <100, Total Cholesterol (CHOL) goal <200 or prevent continue upward trend Current regimen:  None currently Interventions: Has not been able to tolerate statin medications in past Discussed importance of LDL goal Patient self care activities - Over the next 180 days, patient will: Reviewed ways to reduce dietary fat an dcholesterol intake in diet   Diabetes Lab Results  Component Value Date/Time   HGBA1C 7.0 (H) 09/20/2020 11:02 AM   HGBA1C 7.3 (H) 05/27/2020 09:20 AM  Pharmacist Clinical Goal(s): Over the next 90 days,  patient will work with PharmD and providers to maintain A1c goal <7% Current regimen:  Metformin XR 500 mg dail  Interventions: Discussed the importance of limiting carbohydrates (45 grams per meal) Contacted [place] and [place] program and patient was approved to receive Farxiga thru 03/18/2021 however she has decided not to continue [place].  Patient self care activities - Over the next 90 days, patient will: Check blood sugar 3 to 4 times per week document, and provide at future appointments Contact provider with any episodes of hypoglycemia Continue metformin ER 500mg  daily with food  Asthma Pharmacist Clinical Goal(s) Over the next 90 days, patient will work with PharmD and providers to reduce symptoms of asthma Current regimen:  Albuterol HFA 108 mcg 2 puffs every 6 hours as needed  Patient self care activities - Over the next 90 days, patient will: Continue current therapy for asthma control Contact provider if using albuterol more than twice per week. May need to restart Flovent inhaler  Chronic Pain/Neuropathy Pharmacist Clinical Goal(s) Over the next 90 days, patient will work with PharmD and providers to reduce symptoms of pain/neuropathy Current regimen:  none Interventions: Discussed duloxetine and neuropathy - patient reported neuropathy has improved without needing to start duloxetine Patient self care activities - Over the next 90 days, patient will: Continue to follow up with Dr Jacqualyn Posey and primary care provider; Contact provider if neuropathy returns or worsens to discuss treatment.   Medication management Pharmacist Clinical Goal(s): Over the next 90 days, patient will work with PharmD and providers to achieve optimal medication adherence Current pharmacy:  [facility] Interventions Comprehensive medication review performed. Continue current medication management strategy Patient self care activities - Over the next 90 days, patient will: Focus on medication adherence by  filling and taking medications appropriately  Take medications as prescribed Report any questions or concerns to PharmD and/or provider(s) [place] over the counter medication benefits at [phone]. You should get $50 per quarter to use for calcium and vitamin D supplements and other non prescription medications.    Please see past updates related to this goal by clicking on the "Past Updates" button in the selected goal          The patient verbalized understanding of instructions, educational materials, and care plan provided today and declined offer to receive copy of patient instructions, educational materials, and care plan.   Telephone follow up appointment with care management team member scheduled for: 73 days  Cherre Robins, PharmD Clinical Pharmacist [facility]

## 2021-01-11 ENCOUNTER — Ambulatory Visit: Payer: Medicare Other | Admitting: Podiatry

## 2021-01-11 ENCOUNTER — Other Ambulatory Visit: Payer: Self-pay

## 2021-01-11 ENCOUNTER — Encounter: Payer: Self-pay | Admitting: Podiatry

## 2021-01-11 DIAGNOSIS — L608 Other nail disorders: Secondary | ICD-10-CM

## 2021-01-11 DIAGNOSIS — M79609 Pain in unspecified limb: Secondary | ICD-10-CM | POA: Diagnosis not present

## 2021-01-11 DIAGNOSIS — M79675 Pain in left toe(s): Secondary | ICD-10-CM | POA: Diagnosis not present

## 2021-01-11 DIAGNOSIS — M79674 Pain in right toe(s): Secondary | ICD-10-CM | POA: Diagnosis not present

## 2021-01-11 DIAGNOSIS — B351 Tinea unguium: Secondary | ICD-10-CM | POA: Diagnosis not present

## 2021-01-11 DIAGNOSIS — E1149 Type 2 diabetes mellitus with other diabetic neurological complication: Secondary | ICD-10-CM | POA: Diagnosis not present

## 2021-01-11 NOTE — Progress Notes (Signed)
This patient returns to my office for at risk foot care.  This patient requires this care by a professional since this patient will be at risk due to having type 2 diabetes.  This patient is unable to cut nails herself since the patient cannot reach her nails.These nails are painful walking and wearing shoes.  This patient presents for at risk foot care today. Patient states that her left big toenail is especially painful when sleeping.  General Appearance  Alert, conversant and in no acute stress.  Vascular  Dorsalis pedis pulses are palpable  bilaterally. Posterior tibial pulses are weakly palpable  B/L. Capillary return is within normal limits  bilaterally. Temperature is within normal limits  bilaterally.  Neurologic  Senn-Weinstein monofilament wire test diminished   bilaterally. Muscle power within normal limits bilaterally.  Nails Thick disfigured discolored nails with subungual debris  from hallux to fifth toes bilaterally. No evidence of bacterial infection or drainage bilaterally.  Pincer nails  B/Lm especially left hallux.  Orthopedic  No limitations of motion  feet .  No crepitus or effusions noted.  No bony pathology .  Hammer toes  B/L.  Skin  normotropic skin with no porokeratosis noted bilaterally.  No signs of infections or ulcers noted.     Onychomycosis  Pain in right toes  Pain in left toes  Consent was obtained for treatment procedures.   Mechanical debridement of nails 1-5  bilaterally performed with a nail nipper.  Filed with dremel without incident.    Return office visit    3 months                  Told patient to return for periodic foot care and evaluation due to potential at risk complications.   Gardiner Barefoot DPM

## 2021-01-16 DIAGNOSIS — E1165 Type 2 diabetes mellitus with hyperglycemia: Secondary | ICD-10-CM

## 2021-01-16 DIAGNOSIS — E785 Hyperlipidemia, unspecified: Secondary | ICD-10-CM | POA: Diagnosis not present

## 2021-01-16 DIAGNOSIS — I10 Essential (primary) hypertension: Secondary | ICD-10-CM

## 2021-01-16 DIAGNOSIS — E1169 Type 2 diabetes mellitus with other specified complication: Secondary | ICD-10-CM

## 2021-01-16 DIAGNOSIS — E1142 Type 2 diabetes mellitus with diabetic polyneuropathy: Secondary | ICD-10-CM | POA: Diagnosis not present

## 2021-02-14 ENCOUNTER — Other Ambulatory Visit: Payer: Self-pay | Admitting: Family Medicine

## 2021-02-14 DIAGNOSIS — Z1231 Encounter for screening mammogram for malignant neoplasm of breast: Secondary | ICD-10-CM

## 2021-03-06 ENCOUNTER — Ambulatory Visit (INDEPENDENT_AMBULATORY_CARE_PROVIDER_SITE_OTHER): Payer: Medicare Other | Admitting: Pharmacist

## 2021-03-06 DIAGNOSIS — E1165 Type 2 diabetes mellitus with hyperglycemia: Secondary | ICD-10-CM

## 2021-03-06 DIAGNOSIS — I10 Essential (primary) hypertension: Secondary | ICD-10-CM

## 2021-03-06 DIAGNOSIS — E1142 Type 2 diabetes mellitus with diabetic polyneuropathy: Secondary | ICD-10-CM

## 2021-03-06 DIAGNOSIS — E1169 Type 2 diabetes mellitus with other specified complication: Secondary | ICD-10-CM

## 2021-03-06 DIAGNOSIS — J4520 Mild intermittent asthma, uncomplicated: Secondary | ICD-10-CM

## 2021-03-06 NOTE — Patient Instructions (Signed)
Felicia Martin It was a pleasure speaking with you today.  I have attached a summary of our visit today and information about your health goals.   Our next appointment is by telephone on June 05, 2021 at 10am.  Please call the care guide team at [phone] if you need to cancel or reschedule your appointment.   If you have any questions or concerns, please feel free to contact me either at the phone number below or with a MyChart message.   Keep up the good work!  Cherre Robins, PharmD Clinical Pharmacist [facility] [phone] (direct line)  [phone] (main office number)  CARE PLAN ENTRY    Hypertension BP Readings from Last 3 Encounters:  09/20/20 112/86  05/27/20 (!) 150/96  10/05/19 140/78   Pharmacist Clinical Goal(s): Over the next 90 days, patient will work with PharmD and providers to maintain BP goal <140/90 Current regimen:  Amlodipine 5 mg daily  Hydrochlorothiazide 25 mg daily Interventions: Requested patient to record blood pressure when checking daily Coordinated refill for amlodipine at [facility] Patient self care activities - Over the next 90 days, patient will: Check blood pressure daily, document, and provide at future appointments Ensure daily salt intake < 2300 mg/day Pick up refill for amlodipine  Hyperlipidemia Lipid Panel     Component Value Date/Time   CHOL 314 (H) 09/20/2020 1102   TRIG 156.0 (H) 09/20/2020 1102   HDL 56.10 09/20/2020 1102   CHOLHDL 6 09/20/2020 1102   VLDL 31.2 09/20/2020 1102   LDLCALC 227 (H) 09/20/2020 1102   LDLDIRECT 230.7 03/18/2013 0805   Pharmacist Clinical Goal(s): Over the next 180 days, patient will work with PharmD and providers to achieve LDL goal <100, Total Cholesterol (CHOL) goal <200 or prevent continue upward trend Current regimen:  None currently Interventions: Has not been able to tolerate statin medications in past Discussed importance of LDL goal Patient  self care activities - Over the next 180 days, patient will: Reviewed ways to reduce dietary fat an dcholesterol intake in diet Will continue in 2023 to check into possible assistance for lipids lowering therapies with [facility]  Diabetes Lab Results  Component Value Date/Time   HGBA1C 7.0 (H) 09/20/2020 11:02 AM   HGBA1C 7.3 (H) 05/27/2020 09:20 AM   Pharmacist Clinical Goal(s): Over the next 90 days, patient will work with PharmD and providers to maintain A1c goal <7% Current regimen:  Metformin XR 500 mg dail  Interventions: Discussed the importance of limiting carbohydrates (45 grams per meal) Contacted [place] [facility] [employer] program and patient was approved to receive Farxiga thru 03/18/2021 however she has decided not to continue [place].  Patient self care activities - Over the next 90 days, patient will: Check blood sugar 3 to 4 times per week document, and provide at future appointments Contact provider with any episodes of hypoglycemia Continue metformin ER 500mg  daily with food  Asthma Pharmacist Clinical Goal(s) Over the next 90 days, patient will work with PharmD and providers to reduce symptoms of asthma Current regimen:  Albuterol HFA 108 mcg 2 puffs every 6 hours as needed  Patient self care activities - Over the next 90 days, patient will: Continue current therapy for asthma control Contact provider if using albuterol more than twice per week. May need to restart Flovent inhaler  Chronic Pain/Neuropathy Pharmacist Clinical Goal(s) Over the next 90 days, patient will work with PharmD and providers to reduce symptoms of pain/neuropathy Current regimen:  Duloxetine 30mg  daily  Interventions: Discussed duloxetine and neuropathy - patient reported neuropathy has improved without needing to start duloxetine Patient self care activities - Over the next 90 days, patient will: Continue to follow up with Dr Jacqualyn Posey and primary care provider Continue to take duloxetine  daily   Health Maintenance:  Reviewed vaccination history and discussed benefits of annual flu vaccine, Tdap, Shingrix and Covid vaccinations Consider Tdap and Shingric in 2023 (anticipate better coverage through Medicare in 2023)  Appointment made for follow up with PCP for January 2023  Get mammogram, appointment at [facility] for 04/07/2021.   Medication management Pharmacist Clinical Goal(s): Over the next 90 days, patient will work with PharmD and providers to achieve optimal medication adherence Current pharmacy: [facility] Interventions Comprehensive medication review performed. Continue current medication management strategy Patient self care activities - Over the next 90 days, patient will: Focus on medication adherence by filling and taking medications appropriately  Take medications as prescribed Report any questions or concerns to PharmD and/or provider(s) [place] over the counter medication benefits at [phone]. You should get $50 per quarter to use for calcium and vitamin D supplements and other non prescription medications.    Please see past updates related to this goal by clicking on the "Past Updates" button in the selected goal    The patient verbalized understanding of instructions, educational materials, and care plan provided today and agreed to receive a mailed copy of patient instructions, educational materials, and care plan.

## 2021-03-06 NOTE — Chronic Care Management (AMB) (Signed)
Chronic Care Management Pharmacy Note   12/06/2020 Name:  Felicia Martin    MRN:  [id number]      DOB:  1932-03-23   Summary: Patient has tolerated restart of metformin and blood glucose has been at goal.  Home blood pressure has been at goal. Coordinated with pharmacy for amldipine refill. Discussed importance of medication adherence.  Called [facility] to check on mammogram referral sent 02/14/2021 Follow up appt with PCP scheduled.  Discussed recommended immunizations      Subjective: Felicia Martin is an 87 y.o. year old female who is a primary patient of Ann Held, DO.  The CCM team was consulted for assistance with disease management and care coordination needs.     Collaboration with patient and pharmacy  for follow up visit in response to provider referral for pharmacy case management and/or care coordination services.    Consent to Services:  The patient was given information about Chronic Care Management services, agreed to services, and gave verbal consent prior to initiation of services.  Please see initial visit note for detailed documentation.    Patient Care Team: Carollee Herter, Alferd Apa, DO as PCP - General (Family Medicine) Inda Castle, MD (Inactive) as Consulting Physician (Gastroenterology) Cherre Robins, PharmD (Pharmacist)   Objective:   Recent Labs       Lab Results  Component Value Date    CREATININE 0.81 09/20/2020    CREATININE 0.84 05/27/2020    CREATININE 0.81 10/05/2019        Recent Labs       Lab Results  Component Value Date    HGBA1C 7.0 (H) 09/20/2020      Last diabetic Eye exam:  Labs (Brief)  No results found for: HMDIABEYEEXA    Last diabetic Foot exam:  Labs (Brief)  No results found for: HMDIABFOOTEX      Labs (Brief)          Component Value Date/Time    CHOL 314 (H) 09/20/2020 1102    TRIG 156.0 (H) 09/20/2020 1102    HDL 56.10 09/20/2020 1102    CHOLHDL 6 09/20/2020 1102    VLDL  31.2 09/20/2020 1102    LDLCALC 227 (H) 09/20/2020 1102    LDLDIRECT 230.7 03/18/2013 0805        Hepatic Function Latest Ref Rng & Units 09/20/2020 05/27/2020 10/05/2019  Total Protein 6.0 - 8.3 g/dL 8.1 8.0 7.9  Albumin 3.5 - 5.2 g/dL 4.3 3.9 4.2  AST 0 - 37 U/L _0 ALT 0 - 35 U/L _1 Alk Phosphatase 39 - 117 U/L 62 65 63  Total Bilirubin 0.2 - 1.2 mg/dL 0.5 0.4 0.4  Bilirubin, Direct 0.0 - 0.3 mg/dL - - -      Labs (Brief)       Lab Results  Component Value Date/Time    TSH 2.19 09/29/2009 03:47 PM    TSH 1.71 08/18/2007 09:14 AM        CBC Latest Ref Rng & Units 12/16/2013 08/06/2012 05/28/2011  WBC 4.0 - 10.5 K/uL 4.4 5.3 4.6  Hemoglobin 12.0 - 15.0 g/dL 12.2 12.5 12.0  Hematocrit 36.0 - 46.0 % 36.7 37.2 35.9(L)  Platelets 150.0 - 400.0 K/uL 279.0 326.0 271.0      Labs (Brief)       Lab Results  Component Value Date/Time    VD25OH 39 09/29/2009 10:48 PM    VD25OH 27 (L) 09/28/2008  09:02 PM        Clinical ASCVD: No  The ASCVD Risk score (Arnett DK, et al., 2019) failed to calculate for the following reasons:   The 2019 ASCVD risk score is only valid for ages 27 to 85       Social History        Tobacco Use  Smoking Status Former   Packs/day: 0.50   Years: 40.00   Pack years: 20.00   Types: Cigarettes   Quit date: 05/25/1999   Years since quitting: 21.5  Smokeless Tobacco Never       BP Readings from Last 3 Encounters:  09/20/20 112/86  05/27/20 (!) 150/96  10/05/19 140/78       Pulse Readings from Last 3 Encounters:  09/20/20 83  05/27/20 78  10/05/19 71       Wt Readings from Last 3 Encounters:  11/10/20 212 lb (96.2 kg)  09/20/20 212 lb 9.6 oz (96.4 kg)  05/27/20 216 lb 6.4 oz (98.2 kg)      Assessment: Review of patient past medical history, allergies, medications, health status, including review of consultants reports, laboratory and other test data, was performed as part of comprehensive evaluation and provision of chronic care  management services.    SDOH:  (Social Determinants of Health) assessments and interventions performed:  _0 @   CCM Care Plan        Allergies  Allergen Reactions   Fenofibrate Rash   Statins Other (See Comments)      Muscle aches and weakness   Zetia [Ezetimibe] Other (See Comments)      Muscle pain      _1 @       Patient Active Problem List    Diagnosis Date Noted   Diabetic polyneuropathy associated with type 2 diabetes mellitus ([place]) 09/20/2020   Pincer nail deformity 08/17/2020   Statin intolerance 06/03/2020   DDD (degenerative disc disease), thoracolumbar 10/06/2019   Uncontrolled type 2 diabetes mellitus with hyperglycemia ([place]) 10/06/2019   Hyperlipidemia associated with type 2 diabetes mellitus ([place]) 10/06/2019   Cerumen impaction 09/21/2018   Impacted cerumen of both ears 09/16/2018   Lower extremity edema 07/24/2018   DM (diabetes mellitus) type II uncontrolled, periph vascular disorder ([place]) 06/02/2018   Acute left-sided low back pain without sciatica 05/08/2018   Hyperlipidemia 01/29/2017   Colon cancer screening 10/21/2014   Dysuria 03/17/2014   Primary hypertension 03/05/2014   Routine general medical examination at a health care facility 12/07/2013   Elevated blood pressure 12/07/2013   Frequent urination at night 12/07/2013   Nonspecific abnormal electrocardiogram (ECG) (EKG) 12/07/2013   CANDIDIASIS, VULVAR 05/12/2010   UTI 05/04/2010   DEGENERATIVE JOINT DISEASE 09/22/2008   Dysphagia 09/22/2008   Disorder of bone and cartilage 11/05/2007   TELANGIECTASIA 05/29/2007   HYPERCHOLESTEROLEMIA 02/01/2007   PULMONARY FIBROSIS 02/01/2007   SARCOIDOSIS 11/20/2006   Asthma 11/20/2006          Immunization History  Administered Date(s) Administered   PFIZER(Purple Top)SARS-COV-2 Vaccination 06/01/2019, 06/23/2019   Td 03/19/2002      Conditions to be addressed/monitored: HTN, HLD, DMII, and osteopenia, neuropathy, asthma    Care Plan : General Pharmacy (Adult)  Updates made by Cherre Robins, RPH-CPP since 03/06/2021 12:00 AM     Problem: Pharmacy Care Plan - Medication and Chronic Care Management   Priority: High  Onset Date: 09/01/2020     Long-Range Goal: Patient-Specific Goal   Start Date: 09/01/2020  Recent Progress: Not on track  Priority: High  Note:   Current Barriers:  Unable to achieve control of Hyperlipidemia  Unable to maintain control of type 2 DM and HTN Does not adhere to prescribed medication regimen  Pharmacist Clinical Goal(s):  Over the next 120 days, patient will verbalize ability to afford treatment regimen achieve control of hyperlipidemia as evidenced by LDL <100 maintain control of HTN and type 2 DM as evidenced by BP <140/90 and A1c < 7.0%  adhere to prescribed medication regimen as evidenced by fill history and attainment of goals for chronic conditions  through collaboration with PharmD and provider.   Interventions: 1:1 collaboration with Carollee Herter, Alferd Apa, DO regarding development and update of comprehensive plan of care as evidenced by provider attestation and co-signature Inter-disciplinary care team collaboration (see longitudinal plan of care) Comprehensive medication review performed; medication list updated in electronic medical record  Hypertension Improved; last office BP at goal BP goal <140/90  BP Readings from Last 3 Encounters:  09/20/20 112/86  05/27/20 (!) 150/96  10/05/19 140/78  Home BP readings: 120- 139 / 70's Denies symptoms of hypotension - no dizziness  Current regimen:  Amlodipine 5 mg daily  Hydrochlorothiazide 25 mg daily (takes if needed)  Interventions: Reviewed blood pressure goals Continue current regimen for hypertension Requested refill for amlodipine at patient request from [facility] Continue to check blood pressure daily; document, and provide at future appointments  Hyperlipidemia Not at goal; LDL goal <100, Total  Cholesterol (CHOL) goal <200 (since unable to take statins - prevention of upward trend in lipids also a goal) Current regimen:  None currently Patient is intolerant to statin medications - tried Livalo, atorvastatin; pravastatin, rosuvastatin and simvastatin - all eventually caused myalgias Also unable to tolerate ezetimibe or fenofibrate - also caused myalgias Repatha recommended in past but was not started due to cost  Reviewed patient's formulary. Both Repatha and praluent are on her formulary but are tier 3 - $47 / month and she also would have to meet $95 deductible which means first fill of the year would be $142 - patient states she cannot afford this Screened for LIS but patient income above limit Checked with Repatha Ready and patient does not qualify for their discount card and there is no patient assistance program. Usually will try to get funding from [employer] but currently no funding for [employer] for hyperlipidemia [employer] does have a patient assistance program and patinet's PCP OK'd trial of Praluent but when I discussed with patient, she declined due to [place] being an injection.  Interventions:  Discussed LDL goal Reviewed diet - recommending  she limit intake of saturated fat and cholesterol in diet.  Continue to assess for possible assistance for lipid lowering therapy through [facility] in 2023. Might be able to try Nexletol.   Diabetes Improved and A1c now 7.0%;  A1c goal <7% Current regimen:  Metformin 500 mg daily Earlier in 2022 she wanted to try Jardiance but cost was a barrier. She brought in medication assistance application in July but was denied. Representative stated patient's income of over limit. Yearly income must be <250%  of Federal Poverty Level (FPL) and patients is about 264%. Previously checked insurance plan. Has a $95 deductible to meet for branded medications, so first fill of Jardiance or Wilder Glade would be $95 + $47  = $142. Then cost would be $47 per month until reaches coverage gap. She is agreeable to $47 but cannot afford the deductible. 11/2020: Patient tried Wilder Glade but states she  does not want to continued due to increased urinary frequency and she also felt Farxiga caused her to swell (this is unusual as Rosemary Holms usually helps with edema). We did complete and submit PAP app for [place] and I verified it was approved today through 03/18/2021 however no longer needed since patient has stopped [place].  Restarted metformin 11/2020. In the past she tried metformin 564m twice daily but caused nausea and she thought metformin caused her to breakout in rash. Today she reports she has tolerated metformin without side effect over the last month Recent home BG readings - checking about 3 to 4 times per week Ranges from 112 - 150 Interventions: Recommended she continue to check blood sugar 3 to 4 times per week document, and provide at future appointments Continue metformin 5046mdaily with food.  Continue to follow up with podiatrist for foot care.  Asthma Controlled;  goal: reduce symptoms of asthma and prevent exacerbations No recent exacerbations Using albuterol about twice per week - usually when humid outside Has not been taking maintenance inhaler for several months Current regimen:  Albuterol HFA 108 mcg 2 puffs every 6 hours as needed  Interventions:  Continue current therapy for asthma control Contact provider if using albuterol more than twice per week. May need to restart Flovent inhaler  Chronic Pain/Neuropathy Controlled; goals:  reduce symptoms of pain/neuropathy Per patient neuropath improved on it own without starting duloxetine Current regimen:  Duloxetine 3027maily (patient states she is taking occasionally)  Interventions:  Discussed duloxetine and neuropathy Continue to follow up with podiatrist   Health Maintenance:  Reviewed vaccination history and discussed benefits of annual  flu vaccine, Tdap, Shingrix and Covid vaccinations Patient declined flu and COVID vaccinations. Will consider Tdap and Shingric in 2023 (anticipate better coverage through Medicare in 2023)  Appointment made for follow up with PCP for January 2023 at patient request (sees PCP every 6 months)  Patient reports she did get a rolling walker and is using regularly Patient states she has not been able to make appointment for [facility] like order was send [facility]/20[facility]. [facility] state patient mentioned having a bump on her breast and they were requesting a diagnostic mammogram. I called to tell patient and she states bump was gone and that her daughter called the [facility] request appointment for just a screening mammogram.  Explained the difference between screening and diagnostic mammogram. Patient has appointment for screening mammogram for 04/07/2021.   Medication management Current pharmacy: [facility] Interventions Comprehensive medication review performed. Continue current medication management strategy Reviewed adherence and discussed importance of taking medications as prescribed.   Patient Goals/Self-Care Activities Over the next 120 days, patient will:  take medications as prescribed, check glucose 3 to 4 times per week, document, and provide at future appointments, check blood pressure daily, document, and provide at future appointments, and engage in dietary modifications by limiting intake of sugar / carbohydrates and saturated fat  Follow Up Plan:  Follow up in 2 to 3 months       Medication Assistance:  jardiance application was denied. Sent FarWilder Gladeplication and was approved but patient decided 11/2020 to not continue Far[place]  [id number] [place]:  [phone]: [phone]  Follow  Up:  Patient agrees to Care Plan and Follow-up.   Plan: Telephone follow up appointment with care management team member scheduled for:  62 to 90  days (will see PCP in January 2022)    Cherre Robins, PharmD Clinical Pharmacist [facility]

## 2021-03-14 ENCOUNTER — Other Ambulatory Visit: Payer: Self-pay | Admitting: Family Medicine

## 2021-03-18 DIAGNOSIS — E785 Hyperlipidemia, unspecified: Secondary | ICD-10-CM | POA: Diagnosis not present

## 2021-03-18 DIAGNOSIS — I10 Essential (primary) hypertension: Secondary | ICD-10-CM

## 2021-03-18 DIAGNOSIS — E1142 Type 2 diabetes mellitus with diabetic polyneuropathy: Secondary | ICD-10-CM

## 2021-03-18 DIAGNOSIS — E1169 Type 2 diabetes mellitus with other specified complication: Secondary | ICD-10-CM

## 2021-03-18 DIAGNOSIS — E1165 Type 2 diabetes mellitus with hyperglycemia: Secondary | ICD-10-CM | POA: Diagnosis not present

## 2021-03-18 DIAGNOSIS — J4520 Mild intermittent asthma, uncomplicated: Secondary | ICD-10-CM | POA: Diagnosis not present

## 2021-03-24 ENCOUNTER — Encounter: Payer: Self-pay | Admitting: Family Medicine

## 2021-03-24 ENCOUNTER — Ambulatory Visit (INDEPENDENT_AMBULATORY_CARE_PROVIDER_SITE_OTHER): Payer: Medicare Other | Admitting: Family Medicine

## 2021-03-24 VITALS — BP 130/82 | HR 91 | Temp 97.8°F | Resp 20 | Ht 60.0 in | Wt 213.0 lb

## 2021-03-24 DIAGNOSIS — I10 Essential (primary) hypertension: Secondary | ICD-10-CM

## 2021-03-24 DIAGNOSIS — H60503 Unspecified acute noninfective otitis externa, bilateral: Secondary | ICD-10-CM | POA: Diagnosis not present

## 2021-03-24 DIAGNOSIS — E1165 Type 2 diabetes mellitus with hyperglycemia: Secondary | ICD-10-CM

## 2021-03-24 DIAGNOSIS — E785 Hyperlipidemia, unspecified: Secondary | ICD-10-CM

## 2021-03-24 DIAGNOSIS — J45909 Unspecified asthma, uncomplicated: Secondary | ICD-10-CM

## 2021-03-24 DIAGNOSIS — H6123 Impacted cerumen, bilateral: Secondary | ICD-10-CM | POA: Diagnosis not present

## 2021-03-24 DIAGNOSIS — M1909 Primary osteoarthritis, other specified site: Secondary | ICD-10-CM | POA: Diagnosis not present

## 2021-03-24 DIAGNOSIS — J8410 Pulmonary fibrosis, unspecified: Secondary | ICD-10-CM | POA: Diagnosis not present

## 2021-03-24 DIAGNOSIS — J4520 Mild intermittent asthma, uncomplicated: Secondary | ICD-10-CM

## 2021-03-24 LAB — LIPID PANEL
Cholesterol: 292 mg/dL — ABNORMAL HIGH (ref 0–200)
HDL: 56.9 mg/dL (ref >39.00)
LDL Cholesterol: 215 mg/dL — ABNORMAL HIGH (ref 0–99)
NonHDL: 234.9
Total CHOL/HDL Ratio: 5
Triglycerides: 99 mg/dL (ref 0.0–149.0)
VLDL: 19.8 mg/dL (ref 0.0–40.0)

## 2021-03-24 LAB — COMPREHENSIVE METABOLIC PANEL
ALT: 14 U/L (ref 0–35)
AST: 18 U/L (ref 0–37)
Albumin: 4.2 g/dL (ref 3.5–5.2)
Alkaline Phosphatase: 63 U/L (ref 39–117)
BUN: 23 mg/dL (ref 6–23)
CO2: 26 mEq/L (ref 19–32)
Calcium: 10 mg/dL (ref 8.4–10.5)
Chloride: 104 mEq/L (ref 96–112)
Creatinine, Ser: 0.83 mg/dL (ref 0.40–1.20)
GFR: 63.95 mL/min (ref >60.00)
Glucose, Bld: 122 mg/dL — ABNORMAL HIGH (ref 70–99)
Potassium: 4.3 mEq/L (ref 3.5–5.1)
Sodium: 139 mEq/L (ref 135–145)
Total Bilirubin: 0.5 mg/dL (ref 0.2–1.2)
Total Protein: 7.8 g/dL (ref 6.0–8.3)

## 2021-03-24 LAB — HEMOGLOBIN A1C: Hgb A1c MFr Bld: 6.9 % — ABNORMAL HIGH (ref 4.6–6.5)

## 2021-03-24 MED ORDER — MELOXICAM 7.5 MG PO TABS: 7.5000 mg | ORAL_TABLET | Freq: Every day | ORAL | 1 refills | Status: DC

## 2021-03-24 MED ORDER — ALBUTEROL SULFATE HFA 108 (90 BASE) MCG/ACT IN AERS: 2.0000 | INHALATION_SPRAY | Freq: Four times a day (QID) | RESPIRATORY_TRACT | 2 refills | Status: DC | PRN

## 2021-03-24 MED ORDER — FLUTICASONE PROPIONATE HFA 110 MCG/ACT IN AERO: 2.0000 | INHALATION_SPRAY | Freq: Two times a day (BID) | RESPIRATORY_TRACT | 12 refills | Status: DC

## 2021-03-24 MED ORDER — OFLOXACIN 0.3 % OT SOLN: 10.0000 [drp] | Freq: Every day | OTIC | 1 refills | Status: DC

## 2021-03-24 NOTE — Patient Instructions (Signed)

## 2021-03-24 NOTE — Assessment & Plan Note (Signed)
Pt requesting app with ent She does not want [place] to irrigate her ears

## 2021-03-24 NOTE — Assessment & Plan Note (Signed)
Well controlled, no changes to meds. Encouraged heart healthy diet such as the DASH diet and exercise as tolerated.  °

## 2021-03-24 NOTE — Progress Notes (Signed)
Subjective:   By signing my name below, I, Zite Okoli, attest that this documentation has been prepared under the direction and in the presence of Ann Held, DO. 03/24/2021   Patient ID: Felicia Martin, female    DOB: November 30, 1932, 88 y.o.   MRN: [id number]  Chief Complaint  Patient presents with   Diabetes   Hypertension   Follow-up    HPI Patient is in today for an office visit and f/u of diabetes and hypertension.  Her blood pressure was high at the beginning of this visit, It was checked  again during the visit and was 132/82. She reports this was the reading this morning. BP Readings from Last 3 Encounters:  03/24/21 130/82  09/20/20 112/86  05/27/20 (!) 150/96     She would like to seen an ENT because  she thinks he ears are blocked and is using her ear plugs more frequently.  She checks her sugar levels regularly at home and they range between 112 and 132.  She is requesting for a refill on albuterol, 110 mcg Flonase and 7.5 mg meloxicam.  She is not interested in the pneumonia and shingles vaccines.  Past Medical History:  Diagnosis Date   Asthma    GERD (gastroesophageal reflux disease)    Hypercholesterolemia    Hypertension    Pulmonary fibrosis ([place])    Sarcoidosis    Surgical history of tubal ligation 1960s    Past Surgical History:  Procedure Laterality Date   COLONOSCOPY  12/09/14   ESOPHAGOGASTRODUODENOSCOPY  12/09/14   LUNG SURGERY  1960   Right lung   Maloney dilation  12/09/14   TUBAL LIGATION  1960    Family History  Problem Relation Age of Onset   Diabetes Sister        x's 3   Alzheimer's disease Sister    Liver cancer Mother    Liver cancer Father    Cancer Father    Cancer Brother    Diabetes Brother    Hypertension Sister        x's 3   Hyperlipidemia Other    Breast cancer Neg Hx     Social History   Socioeconomic History   Marital status: Widowed    Spouse name: Not on file   Number of children: 4    Years of education: Not on file   Highest education level: Not on file  Occupational History   Occupation: Chartered certified accountant  Tobacco Use   Smoking status: Former    Packs/day: 0.50    Years: 40.00    Pack years: 20.00    Types: Cigarettes    Quit date: 05/25/1999    Years since quitting: 21.8   Smokeless tobacco: Never  Substance and Sexual Activity   Alcohol use: No   Drug use: No   Sexual activity: Never  Other Topics Concern   Not on file  Social History Narrative   Not on file   Social Determinants of Health   Financial Resource Strain: Medium Risk   Difficulty of Paying Living Expenses: Somewhat hard  Food Insecurity: No Food Insecurity   Worried About Charity fundraiser in the Last Year: Never true   Ran Out of Food in the Last Year: Never true  Transportation Needs: No Transportation Needs   Lack of Transportation (Medical): No   Lack of Transportation (Non-Medical): No  Physical Activity: Inactive   Days of Exercise per Week: 0 days   Minutes of  Exercise per Session: 0 min  Stress: No Stress Concern Present   Feeling of Stress : Not at all  Social Connections: Moderately Isolated   Frequency of Communication with Friends and Family: More than three times a week   Frequency of Social Gatherings with Friends and Family: More than three times a week   Attends Religious Services: 1 to 4 times per year   Active Member of [employer] or Organizations: No   Attends Archivist Meetings: Never   Marital Status: Widowed  Human resources officer Violence: Not At Risk   Fear of Current or Ex-Partner: No   Emotionally Abused: No   Physically Abused: No   Sexually Abused: No    Outpatient Medications Prior to Visit  Medication Sig Dispense Refill   amLODipine (NORVASC) 5 MG tablet TAKE 1 TABLET(5 MG) BY MOUTH DAILY 90 tablet 1   Blood Glucose Monitoring Suppl (ONETOUCH VERIO) w/Device KIT Use as directed once a day.  Dx code: E11.9 1 kit 0   DULoxetine (CYMBALTA) 30 MG  capsule Take 1 capsule (30 mg total) by mouth daily. 90 capsule 0   glucose blood (ONETOUCH VERIO) test strip USE AS DIRECTED ONCE A DAY 100 strip 12   hydrochlorothiazide (HYDRODIURIL) 25 MG tablet Take 1 tablet (25 mg total) by mouth daily. (Patient taking differently: Take 25 mg by mouth daily as needed.) 90 tablet 3   Lancets (ONETOUCH DELICA PLUS [id number]) MISC USE ONCE A DAY 100 each 1   metFORMIN (GLUCOPHAGE) 500 MG tablet Take 1 tablet (500 mg total) by mouth daily with breakfast. 90 tablet 1   albuterol (VENTOLIN HFA) 108 (90 Base) MCG/ACT inhaler Inhale 2 puffs into the lungs every 6 (six) hours as needed. 18 g 2   meloxicam (MOBIC) 7.5 MG tablet Take 1 tablet (7.5 mg total) by mouth daily. (Patient taking differently: Take 7.5 mg by mouth daily. Takes as needed) 30 tablet 3   ofloxacin (FLOXIN OTIC) 0.3 % OTIC solution Place 10 drops into both ears daily. 10 mL 1   No facility-administered medications prior to visit.    Allergies  Allergen Reactions   Fenofibrate Rash   Statins Other (See Comments)    Muscle aches and weakness   Zetia [Ezetimibe] Other (See Comments)    Muscle pain   Farxiga [Dapagliflozin] Swelling    Patient didn't like increase in urinary frequency and reported swelling    Review of Systems  Constitutional:  Negative for fever.  HENT:  Negative for congestion, ear pain, hearing loss, sinus pain and sore throat.   Eyes:  Negative for blurred vision and pain.  Respiratory:  Negative for cough, sputum production, shortness of breath and wheezing.   Cardiovascular:  Negative for chest pain and palpitations.  Gastrointestinal:  Negative for blood in stool, constipation, diarrhea, nausea and vomiting.  Genitourinary:  Negative for dysuria, frequency, hematuria and urgency.  Musculoskeletal:  Negative for back pain, falls and myalgias.  Neurological:  Negative for dizziness, sensory change, loss of consciousness, weakness and headaches.  Endo/Heme/Allergies:   Negative for environmental allergies. Does not bruise/bleed easily.  Psychiatric/Behavioral:  Negative for depression and suicidal ideas. The patient is not nervous/anxious and does not have insomnia.       Objective:    Physical Exam Constitutional:      General: She is not in acute distress.    Appearance: Normal appearance. She is not ill-appearing.  HENT:     Head: Normocephalic and atraumatic.     Right  Ear: External ear normal. There is impacted cerumen.     Left Ear: External ear normal. There is impacted cerumen.  Eyes:     Extraocular Movements: Extraocular movements intact.     Pupils: Pupils are equal, round, and reactive to light.  Cardiovascular:     Rate and Rhythm: Normal rate and regular rhythm.     Pulses: Normal pulses.     Heart sounds: Normal heart sounds. No murmur heard.   No gallop.  Pulmonary:     Effort: Pulmonary effort is normal. No respiratory distress.     Breath sounds: Normal breath sounds. No wheezing, rhonchi or rales.  Abdominal:     General: Bowel sounds are normal. There is no distension.     Palpations: Abdomen is soft. There is no mass.     Tenderness: There is no abdominal tenderness. There is no guarding or rebound.     Hernia: No hernia is present.  Musculoskeletal:     Cervical back: Normal range of motion and neck supple.     Right lower leg: Edema present.     Left lower leg: Edema present.  Lymphadenopathy:     Cervical: No cervical adenopathy.  Skin:    General: Skin is warm and dry.  Neurological:     Mental Status: She is alert and oriented to person, place, and time.  Psychiatric:        Behavior: Behavior normal.    BP 130/82    Pulse 91    Temp 97.8 F (36.6 C) (Oral)    Resp 20    Ht 5' (1.524 m)    Wt 213 lb (96.6 kg)    LMP 03/17/2014    SpO2 97%    BMI 41.60 kg/m  Wt Readings from Last 3 Encounters:  03/24/21 213 lb (96.6 kg)  11/10/20 212 lb (96.2 kg)  09/20/20 212 lb 9.6 oz (96.4 kg)    Diabetic Foot Exam -  Simple   No data filed    Lab Results  Component Value Date   WBC 4.4 12/16/2013   HGB 12.2 12/16/2013   HCT 36.7 12/16/2013   PLT 279.0 12/16/2013   GLUCOSE 114 (H) 09/20/2020   CHOL 314 (H) 09/20/2020   TRIG 156.0 (H) 09/20/2020   HDL 56.10 09/20/2020   LDLDIRECT 230.7 03/18/2013   LDLCALC 227 (H) 09/20/2020   ALT 9 09/20/2020   AST 15 09/20/2020   NA 137 09/20/2020   K 4.3 09/20/2020   CL 105 09/20/2020   CREATININE 0.81 09/20/2020   BUN 21 09/20/2020   CO2 22 09/20/2020   TSH 2.19 09/29/2009   HGBA1C 7.0 (H) 09/20/2020   MICROALBUR 1.2 05/27/2020    Lab Results  Component Value Date   TSH 2.19 09/29/2009   Lab Results  Component Value Date   WBC 4.4 12/16/2013   HGB 12.2 12/16/2013   HCT 36.7 12/16/2013   MCV 92.0 12/16/2013   PLT 279.0 12/16/2013   Lab Results  Component Value Date   NA 137 09/20/2020   K 4.3 09/20/2020   CO2 22 09/20/2020   GLUCOSE 114 (H) 09/20/2020   BUN 21 09/20/2020   CREATININE 0.81 09/20/2020   BILITOT 0.5 09/20/2020   ALKPHOS 62 09/20/2020   AST 15 09/20/2020   ALT 9 09/20/2020   PROT 8.1 09/20/2020   ALBUMIN 4.3 09/20/2020   CALCIUM 9.7 09/20/2020   GFR 66.08 09/20/2020   Lab Results  Component Value Date   CHOL 314 (H)  09/20/2020   Lab Results  Component Value Date   HDL 56.10 09/20/2020   Lab Results  Component Value Date   LDLCALC 227 (H) 09/20/2020   Lab Results  Component Value Date   TRIG 156.0 (H) 09/20/2020   Lab Results  Component Value Date   CHOLHDL 6 09/20/2020   Lab Results  Component Value Date   HGBA1C 7.0 (H) 09/20/2020       Assessment & Plan:   Problem List Items Addressed This Visit       Unprioritized   PULMONARY FIBROSIS   Asthma   Relevant Medications   albuterol (VENTOLIN HFA) 108 (90 Base) MCG/ACT inhaler   fluticasone (FLOVENT HFA) 110 MCG/ACT inhaler   Cerumen impaction   Relevant Orders   Ambulatory referral to ENT   Uncontrolled type 2 diabetes mellitus with  hyperglycemia ([place]) - Primary   Hyperlipidemia    Encourage heart healthy diet such as MIND or DASH diet, increase exercise, avoid trans fats, simple carbohydrates and processed foods, consider a krill or fish or flaxseed oil cap daily.       Impacted cerumen of both ears    Pt requesting app with ent She does not want [place] to irrigate her ears      Primary hypertension    Well controlled, no changes to meds. Encouraged heart healthy diet such as the DASH diet and exercise as tolerated.       Relevant Orders   Comprehensive metabolic panel   Hemoglobin A1c   Lipid panel   Other Visit Diagnoses     Uncomplicated asthma       Relevant Medications   albuterol (VENTOLIN HFA) 108 (90 Base) MCG/ACT inhaler   fluticasone (FLOVENT HFA) 110 MCG/ACT inhaler   Primary osteoarthritis of other site       Relevant Medications   meloxicam (MOBIC) 7.5 MG tablet   Acute otitis externa of both ears, unspecified type       Relevant Medications   ofloxacin (FLOXIN OTIC) 0.3 % OTIC solution        Meds ordered this encounter  Medications   albuterol (VENTOLIN HFA) 108 (90 Base) MCG/ACT inhaler    Sig: Inhale 2 puffs into the lungs every 6 (six) hours as needed.    Dispense:  18 g    Refill:  2   fluticasone (FLOVENT HFA) 110 MCG/ACT inhaler    Sig: Inhale 2 puffs into the lungs 2 (two) times daily.    Dispense:  1 each    Refill:  12   meloxicam (MOBIC) 7.5 MG tablet    Sig: Take 1 tablet (7.5 mg total) by mouth daily. Takes as needed    Dispense:  90 tablet    Refill:  1   ofloxacin (FLOXIN OTIC) 0.3 % OTIC solution    Sig: Place 10 drops into both ears daily.    Dispense:  10 mL    Refill:  1    I,Zite Okoli,acting as a scribe for [employer], DO.,have documented all relevant documentation on the behalf of Ann Held, DO,as directed by  Ann Held, DO while in the presence of Ann Held, DO.   I, Ann Held, DO. , personally  preformed the services described in this documentation.  All medical record entries made by the scribe were at my direction and in my presence.  I have reviewed the chart and discharge instructions (if applicable) and agree that the  record reflects my personal performance and is accurate and complete. 03/24/2021

## 2021-03-24 NOTE — Assessment & Plan Note (Signed)
Encourage heart healthy diet such as MIND or DASH diet, increase exercise, avoid trans fats, simple carbohydrates and processed foods, consider a krill or fish or flaxseed oil cap daily.  °

## 2021-03-29 ENCOUNTER — Telehealth: Payer: Self-pay

## 2021-03-29 NOTE — Telephone Encounter (Signed)
Pt's daughter, Olivia Mackie, called stating [facility] has not gotten an order for the mammogram she scheduled for the pt for 04/07/21 @ 8:30 am.  Olivia Mackie is requesting to pick up a copy of the order from the office here or have a copy of it mailed to the patient at her home address (which is correct in Epic as I verified it with her on the phone).  Please make [facility] so patient can have mammogram done on 04/07/21.  Olivia Mackie is requesting a follow up call back on this.  CB [phone].

## 2021-03-30 NOTE — Telephone Encounter (Signed)
Order was already in [place] and placed in November. Orders faxed to [facility]

## 2021-04-07 ENCOUNTER — Ambulatory Visit
Admission: RE | Admit: 2021-04-07 | Discharge: 2021-04-07 | Disposition: A | Discharge status: Home / Self Care | Payer: Medicare Other | Source: Ambulatory Visit | Attending: Family Medicine | Admitting: Family Medicine

## 2021-04-07 DIAGNOSIS — Z1231 Encounter for screening mammogram for malignant neoplasm of breast: Secondary | ICD-10-CM | POA: Diagnosis not present

## 2021-04-12 ENCOUNTER — Other Ambulatory Visit: Payer: Self-pay

## 2021-04-12 ENCOUNTER — Encounter: Payer: Self-pay | Admitting: Podiatry

## 2021-04-12 ENCOUNTER — Ambulatory Visit: Payer: Medicare Other | Admitting: Podiatry

## 2021-04-12 DIAGNOSIS — L608 Other nail disorders: Secondary | ICD-10-CM

## 2021-04-12 DIAGNOSIS — M79609 Pain in unspecified limb: Secondary | ICD-10-CM

## 2021-04-12 DIAGNOSIS — B351 Tinea unguium: Secondary | ICD-10-CM | POA: Diagnosis not present

## 2021-04-12 DIAGNOSIS — E1149 Type 2 diabetes mellitus with other diabetic neurological complication: Secondary | ICD-10-CM | POA: Diagnosis not present

## 2021-04-12 NOTE — Progress Notes (Signed)
This patient returns to my office for at risk foot care.  This patient requires this care by a professional since this patient will be at risk due to having type 2 diabetes.  This patient is unable to cut nails herself since the patient cannot reach her nails.These nails are painful walking and wearing shoes.  This patient presents for at risk foot care today. Patient states that her left big toenail is especially painful when sleeping.  General Appearance  Alert, conversant and in no acute stress.  Vascular  Dorsalis pedis pulses are palpable  bilaterally. Posterior tibial pulses are weakly palpable  B/L. Capillary return is within normal limits  bilaterally. Temperature is within normal limits  bilaterally.  Neurologic  Senn-Weinstein monofilament wire test diminished   bilaterally. Muscle power within normal limits bilaterally.  Nails Thick disfigured discolored nails with subungual debris  from hallux to fifth toes bilaterally. No evidence of bacterial infection or drainage bilaterally.  Pincer nails  B/Lm especially left hallux.  Orthopedic  No limitations of motion  feet .  No crepitus or effusions noted.  No bony pathology .  Hammer toes  B/L.  Skin  normotropic skin with no porokeratosis noted bilaterally.  No signs of infections or ulcers noted.     Onychomycosis  Pain in right toes  Pain in left toes  Consent was obtained for treatment procedures.   Mechanical debridement of nails 1-5  bilaterally performed with a nail nipper.  Filed with dremel without incident.    Return office visit    10 weeks                 Told patient to return for periodic foot care and evaluation due to potential at risk complications.   Everitt Wenner DPM  

## 2021-04-20 ENCOUNTER — Ambulatory Visit: Payer: Medicare Other | Admitting: Family

## 2021-04-21 ENCOUNTER — Encounter: Payer: Self-pay | Admitting: Family Medicine

## 2021-04-21 ENCOUNTER — Ambulatory Visit (INDEPENDENT_AMBULATORY_CARE_PROVIDER_SITE_OTHER): Payer: Medicare Other | Admitting: Family Medicine

## 2021-04-21 VITALS — BP 137/54 | HR 78 | Temp 98.0°F | Ht 60.0 in | Wt 214.8 lb

## 2021-04-21 DIAGNOSIS — J069 Acute upper respiratory infection, unspecified: Secondary | ICD-10-CM | POA: Diagnosis not present

## 2021-04-21 MED ORDER — BENZONATATE 200 MG PO CAPS
200.0000 mg | ORAL_CAPSULE | Freq: Two times a day (BID) | ORAL | 0 refills | Status: DC | PRN
Start: 2021-04-21 — End: 2021-06-05

## 2021-04-21 MED ORDER — AMOXICILLIN-POT CLAVULANATE 875-125 MG PO TABS: 1.0000 | ORAL_TABLET | Freq: Two times a day (BID) | ORAL | 0 refills | Status: AC

## 2021-04-21 NOTE — Progress Notes (Signed)
Acute Office Visit  Subjective:    Patient ID: Felicia Martin, female    DOB: 1932/07/25, 88 y.o.   MRN: [id number]  Chief Complaint  Patient presents with   Cough    Cough  Patient is in today for cough.   Patient reports she had cold symptoms 7-8 days ago. She does feel like she is improving, but she has a lingering cough. Her family wanted her to come get checked out. Denies fevers, nausea, vomiting, chest pain, dyspnea, body aches, fatigue. Cough is not very productive any more and seems to be worse at night. She had to use her albuterol once yesterday for some mild wheezing. States otherwise she feels alright. She has gotten some improvement with day/night alka seltzer, mucinex, cough drops.      Past Medical History:  Diagnosis Date   Asthma    GERD (gastroesophageal reflux disease)    Hypercholesterolemia    Hypertension    Pulmonary fibrosis ([place])    Sarcoidosis    Surgical history of tubal ligation 1960s    Past Surgical History:  Procedure Laterality Date   COLONOSCOPY  12/09/14   ESOPHAGOGASTRODUODENOSCOPY  12/09/14   LUNG SURGERY  1960   Right lung   Maloney dilation  12/09/14   TUBAL LIGATION  1960    Family History  Problem Relation Age of Onset   Diabetes Sister        x's 3   Alzheimer's disease Sister    Liver cancer Mother    Liver cancer Father    Cancer Father    Cancer Brother    Diabetes Brother    Hypertension Sister        x's 3   Hyperlipidemia Other    Breast cancer Neg Hx     Social History   Socioeconomic History   Marital status: Widowed    Spouse name: Not on file   Number of children: 4   Years of education: Not on file   Highest education level: Not on file  Occupational History   Occupation: Chartered certified accountant  Tobacco Use   Smoking status: Former    Packs/day: 0.50    Years: 40.00    Pack years: 20.00    Types: Cigarettes    Quit date: 05/25/1999    Years since quitting: 21.9   Smokeless tobacco: Never  Substance  and Sexual Activity   Alcohol use: No   Drug use: No   Sexual activity: Never  Other Topics Concern   Not on file  Social History Narrative   Not on file   Social Determinants of Health   Financial Resource Strain: Medium Risk   Difficulty of Paying Living Expenses: Somewhat hard  Food Insecurity: No Food Insecurity   Worried About Charity fundraiser in the Last Year: Never true   Ran Out of Food in the Last Year: Never true  Transportation Needs: No Transportation Needs   Lack of Transportation (Medical): No   Lack of Transportation (Non-Medical): No  Physical Activity: Inactive   Days of Exercise per Week: 0 days   Minutes of Exercise per Session: 0 min  Stress: No Stress Concern Present   Feeling of Stress : Not at all  Social Connections: Moderately Isolated   Frequency of Communication with Friends and Family: More than three times a week   Frequency of Social Gatherings with Friends and Family: More than three times a week   Attends Religious Services: 1 to 4 times  per year   Active Member of Clubs or Organizations: No   Attends Archivist Meetings: Never   Marital Status: Widowed  Human resources officer Violence: Not At Risk   Fear of Current or Ex-Partner: No   Emotionally Abused: No   Physically Abused: No   Sexually Abused: No    Outpatient Medications Prior to Visit  Medication Sig Dispense Refill   albuterol (VENTOLIN HFA) 108 (90 Base) MCG/ACT inhaler Inhale 2 puffs into the lungs every 6 (six) hours as needed. 18 g 2   amLODipine (NORVASC) 5 MG tablet TAKE 1 TABLET(5 MG) BY MOUTH DAILY 90 tablet 1   Blood Glucose Monitoring Suppl (ONETOUCH VERIO) w/Device KIT Use as directed once a day.  Dx code: E11.9 1 kit 0   fluticasone (FLOVENT HFA) 110 MCG/ACT inhaler Inhale 2 puffs into the lungs 2 (two) times daily. 1 each 12   glucose blood (ONETOUCH VERIO) test strip USE AS DIRECTED ONCE A DAY 100 strip 12   hydrochlorothiazide (HYDRODIURIL) 25 MG tablet Take  1 tablet (25 mg total) by mouth daily. (Patient taking differently: Take 25 mg by mouth daily as needed.) 90 tablet 3   Lancets (ONETOUCH DELICA PLUS [id number]) MISC USE ONCE A DAY 100 each 1   meloxicam (MOBIC) 7.5 MG tablet Take 1 tablet (7.5 mg total) by mouth daily. Takes as needed 90 tablet 1   metFORMIN (GLUCOPHAGE) 500 MG tablet Take 1 tablet (500 mg total) by mouth daily with breakfast. 90 tablet 1   DULoxetine (CYMBALTA) 30 MG capsule Take 1 capsule (30 mg total) by mouth daily. 90 capsule 0   ofloxacin (FLOXIN OTIC) 0.3 % OTIC solution Place 10 drops into both ears daily. 10 mL 1   No facility-administered medications prior to visit.    Allergies  Allergen Reactions   Fenofibrate Rash   Statins Other (See Comments)    Muscle aches and weakness   Zetia [Ezetimibe] Other (See Comments)    Muscle pain   Farxiga [Dapagliflozin] Swelling    Patient didn't like increase in urinary frequency and reported swelling    Review of Systems  Respiratory:  Positive for cough.   All review of systems negative except what is listed in the HPI     Objective:    Physical Exam Vitals reviewed.  Constitutional:      Appearance: Normal appearance.  HENT:     Head: Normocephalic and atraumatic.     Mouth/Throat:     Mouth: Mucous membranes are moist.     Pharynx: Oropharynx is clear.  Cardiovascular:     Rate and Rhythm: Normal rate and regular rhythm.  Pulmonary:     Effort: Pulmonary effort is normal.     Breath sounds: Normal breath sounds. No wheezing, rhonchi or rales.  Musculoskeletal:     Cervical back: Normal range of motion and neck supple. No tenderness.  Lymphadenopathy:     Cervical: No cervical adenopathy.  Skin:    General: Skin is warm and dry.  Neurological:     General: No focal deficit present.     Mental Status: She is alert and oriented to person, place, and time. Mental status is at baseline.  Psychiatric:        Mood and Affect: Mood normal.         Behavior: Behavior normal.        Thought Content: Thought content normal.        Judgment: Judgment normal.    BP Marland Kitchen)  137/54    Pulse 78    Temp 98 F (36.7 C)    Ht 5' (1.524 m)    Wt 214 lb 12.8 oz (97.4 kg)    LMP 03/17/2014    SpO2 99%    BMI 41.95 kg/m  Wt Readings from Last 3 Encounters:  04/21/21 214 lb 12.8 oz (97.4 kg)  03/24/21 213 lb (96.6 kg)  11/10/20 212 lb (96.2 kg)    Health Maintenance Due  Topic Date Due   COVID-19 Vaccine (3 - Booster for Pfizer series) 08/18/2019   URINE MICROALBUMIN  05/27/2021    There are no preventive care reminders to display for this patient.   Lab Results  Component Value Date   TSH 2.19 09/29/2009   Lab Results  Component Value Date   WBC 4.4 12/16/2013   HGB 12.2 12/16/2013   HCT 36.7 12/16/2013   MCV 92.0 12/16/2013   PLT 279.0 12/16/2013   Lab Results  Component Value Date   NA 139 03/24/2021   K 4.3 03/24/2021   CO2 26 03/24/2021   GLUCOSE 122 (H) 03/24/2021   BUN 23 03/24/2021   CREATININE 0.83 03/24/2021   BILITOT 0.5 03/24/2021   ALKPHOS 63 03/24/2021   AST 18 03/24/2021   ALT 14 03/24/2021   PROT 7.8 03/24/2021   ALBUMIN 4.2 03/24/2021   CALCIUM 10.0 03/24/2021   GFR 63.95 03/24/2021   Lab Results  Component Value Date   CHOL 292 (H) 03/24/2021   Lab Results  Component Value Date   HDL 56.90 03/24/2021   Lab Results  Component Value Date   LDLCALC 215 (H) 03/24/2021   Lab Results  Component Value Date   TRIG 99.0 03/24/2021   Lab Results  Component Value Date   CHOLHDL 5 03/24/2021   Lab Results  Component Value Date   HGBA1C 6.9 (H) 03/24/2021       Assessment & Plan:    1. Viral URI with cough Try a few more days of conservative measures -  rest, hydration, humidifier use, steam showers, warm compresses to sinuses, warm liquids with lemon and honey, and over-the-counter cough, cold, and analgesics as needed (especially Mucinex, Flonase, humidifier use).  I am adding a cough  medicine for you (Benzonatate) If no improvement by Sunday, or if worse before then, start the antibiotics.    - benzonatate (TESSALON) 200 MG capsule; Take 1 capsule (200 mg total) by mouth 2 (two) times daily as needed for cough.  Dispense: 20 capsule; Refill: 0 - amoxicillin-clavulanate (AUGMENTIN) 875-125 MG tablet; Take 1 tablet by mouth 2 (two) times daily for 10 days.  Dispense: 20 tablet; Refill: 0   Follow-up if symptoms worsen or fail to improve.   Terrilyn Saver, NP

## 2021-04-21 NOTE — Patient Instructions (Signed)
Try a few more days of conservative measures -  rest, hydration, humidifier use, steam showers, warm compresses to sinuses, warm liquids with lemon and honey, and over-the-counter cough, cold, and analgesics as needed (especially Mucinex, Flonase, humidifier use).  I am adding a cough medicine for you (Benzonatate) If no improvement by Sunday, or if worse before then, start the antibiotics.   Please contact office for follow-up if symptoms do not improve or worsen. Seek emergency care if symptoms become severe.

## 2021-05-31 ENCOUNTER — Telehealth: Payer: Self-pay | Admitting: Pharmacist

## 2021-05-31 MED ORDER — ONETOUCH VERIO VI STRP: ORAL_STRIP | 3 refills | Status: DC

## 2021-05-31 NOTE — Telephone Encounter (Signed)
-----   Message from [employer] sent at 05/31/2021 12:48 PM EDT ----- ?Regarding: refill on test strips ?Hey, ? ?This pt called to make sure she was scheduled with you on Monday and says she needs refills on her Verio test strips to [facility], I didn't know if you could send it in or do I need to forward this to pcp, thanks!  ? ?Staci  ? ?

## 2021-06-05 ENCOUNTER — Ambulatory Visit (INDEPENDENT_AMBULATORY_CARE_PROVIDER_SITE_OTHER): Payer: Medicare Other | Admitting: Pharmacist

## 2021-06-05 DIAGNOSIS — E1169 Type 2 diabetes mellitus with other specified complication: Secondary | ICD-10-CM

## 2021-06-05 DIAGNOSIS — E1140 Type 2 diabetes mellitus with diabetic neuropathy, unspecified: Secondary | ICD-10-CM

## 2021-06-05 DIAGNOSIS — J4520 Mild intermittent asthma, uncomplicated: Secondary | ICD-10-CM

## 2021-06-05 DIAGNOSIS — I10 Essential (primary) hypertension: Secondary | ICD-10-CM

## 2021-06-05 MED ORDER — METFORMIN HCL 500 MG PO TABS: 500.0000 mg | ORAL_TABLET | Freq: Every day | ORAL | 1 refills | Status: DC

## 2021-06-05 NOTE — Chronic Care Management (AMB) (Signed)
?  ? ?Chronic Care Management ?Pharmacy Note ?  ?12/06/2020 ?Name:  Felicia Martin    MRN:  [id number]      DOB:  [birth year] ?  ?Summary: ?Patient has tolerated restart of metformin and blood glucose has been at goal. Sent in updated prescription for metformin today. ?Home blood pressure has been at goal. Coordinated with pharmacy for amlodipine refill. Discussed importance of medication adherence.  ?Patient unable to tolerate statins or ezetimibe and other pharmacotherapy options are not affordable to patient declined (due to being injections)  ?Recommended she continue Metamucil daily for GI health and could lower LDL a little.  ? Recommended trial of over-the-counter lidocaine 4% patches for lower back pain ?  ?Subjective: ?Felicia Martin is an 88 y.o. year old female who is a primary patient of Ann Held, DO.  The CCM team was consulted for assistance with disease management and care coordination needs.   ?  ?Collaboration with patient and pharmacy  for follow up visit in response to provider referral for pharmacy case management and/or care coordination services.  ?  ?Consent to Services:  ?The patient was given information about Chronic Care Management services, agreed to services, and gave verbal consent prior to initiation of services.  Please see initial visit note for detailed documentation.  ?  ?Patient Care Team: ?Carollee Herter, Alferd Apa, DO as PCP - General (Family Medicine) ?Inda Castle, MD (Inactive) as Consulting Physician (Gastroenterology) ?Cherre Robins, PharmD (Pharmacist) ?  ?Recent Office Visits:  ?04/21/2021 - Fam Med Olevia Bowens, NP) Seen for cough. Suspect viral UTI with cough. Prescribed benzonatate 21m twice a day as needed for cough and generic Augmentin 8769mtwice a day for 10 days.  ?03/24/2021 - Fam Med (Dr LoCarollee HerterF/U HTN and diabetes. no med changes. refilled meloxicam and fluticasone inhaler ? ?Recent Consults:  ?03/23/2021 - Podiatry (Dr MaPrudence DavidsonMechanical  debridement of nails 1-5  bilaterally performed with a nail nipper.  Filed with dremel without incident.  ? ?Objective: ?  ?Recent Labs  ?     ?Lab Results  ?Component Value Date  ?  CREATININE 0.81 09/20/2020  ?  CREATININE 0.84 05/27/2020  ?  CREATININE 0.81 10/05/2019  ?  ?  ?  ?Recent Labs  ?     ?Lab Results  ?Component Value Date  ?  HGBA1C 7.0 (H) 09/20/2020  ?  ?  ?Last diabetic Eye exam:  ?Labs (Brief)  ?No results found for: HMDIABEYEEXA  ?  ?Last diabetic Foot exam:  ?Labs (Brief)  ?No results found for: HMDIABFOOTEX  ?  ?  ?Labs (Brief)  ?     ?   ?Component Value Date/Time  ?  CHOL 314 (H) 09/20/2020 1102  ?  TRIG 156.0 (H) 09/20/2020 1102  ?  HDL 56.10 09/20/2020 1102  ?  CHOLHDL 6 09/20/2020 1102  ?  VLDL 31.2 09/20/2020 1102  ?  LDLCALC 227 (H) 09/20/2020 1102  ?  LDLDIRECT 230.7 03/18/2013 0805  ?  ?  ?  ?Hepatic Function Latest Ref Rng & Units 09/20/2020 05/27/2020 10/05/2019  ?Total Protein 6.0 - 8.3 g/dL 8.1 8.0 7.9  ?Albumin 3.5 - 5.2 g/dL 4.3 3.9 4.2  ?AST 0 - 37 U/L _0 ?ALT 0 - 35 U/L _1 ?Alk Phosphatase 39 - 117 U/L 62 65 63  ?Total Bilirubin 0.2 - 1.2 mg/dL 0.5 0.4 0.4  ?Bilirubin, Direct 0.0 - 0.3 mg/dL - - -  ?  ?  ?  Labs (Brief)  ?     ?Lab Results  ?Component Value Date/Time  ?  TSH 2.19 09/29/2009 03:47 PM  ?  TSH 1.71 08/18/2007 09:14 AM  ?  ?  ?  ?CBC Latest Ref Rng & Units 12/16/2013 08/06/2012 05/28/2011  ?WBC 4.0 - 10.5 K/uL 4.4 5.3 4.6  ?Hemoglobin 12.0 - 15.0 g/dL 12.2 12.5 12.0  ?Hematocrit 36.0 - 46.0 % 36.7 37.2 35.9(L)  ?Platelets 150.0 - 400.0 K/uL 279.0 326.0 271.0  ?  ?  ?Labs (Brief)  ?     ?Lab Results  ?Component Value Date/Time  ?  VD25OH 39 09/29/2009 10:48 PM  ?  VD25OH 27 (L) 09/28/2008 09:02 PM  ?  ?  ?  ?Clinical ASCVD: No  ?The ASCVD Risk score (Arnett DK, et al., 2019) failed to calculate for the following reasons: ?  The 2019 ASCVD risk score is only valid for ages 66 to 44   ?  ?  ?Social History  ?  ?    ?Tobacco Use  ?Smoking Status Former  ? Packs/day:  0.50  ? Years: 40.00  ? Pack years: 20.00  ? Types: Cigarettes  ? Quit date: 05/25/1999  ? Years since quitting: 21.5  ?Smokeless Tobacco Never  ?  ?   ?BP Readings from Last 3 Encounters:  ?09/20/20 112/86  ?05/27/20 (!) 150/96  ?10/05/19 140/78  ?  ?   ?Pulse Readings from Last 3 Encounters:  ?09/20/20 83  ?05/27/20 78  ?10/05/19 71  ?  ?   ?Wt Readings from Last 3 Encounters:  ?11/10/20 212 lb (96.2 kg)  ?09/20/20 212 lb 9.6 oz (96.4 kg)  ?05/27/20 216 lb 6.4 oz (98.2 kg)  ?  ?  ?Assessment: Review of patient past medical history, allergies, medications, health status, including review of consultants reports, laboratory and other test data, was performed as part of comprehensive evaluation and provision of chronic care management services.  ?  ?SDOH:  (Social Determinants of Health) assessments and interventions performed:  ?_0 @ ?  ?CCM Care Plan ?  ?     ?Allergies  ?Allergen Reactions  ? Fenofibrate Rash  ? Statins Other (See Comments)  ?    Muscle aches and weakness  ? Zetia [Ezetimibe] Other (See Comments)  ?    Muscle pain  ?  ?  ?_1 @ ?  ?    ?Patient Active Problem List  ?  Diagnosis Date Noted  ? Diabetic polyneuropathy associated with type 2 diabetes mellitus ([place]) 09/20/2020  ? Pincer nail deformity 08/17/2020  ? Statin intolerance 06/03/2020  ? DDD (degenerative disc disease), thoracolumbar 10/06/2019  ? Uncontrolled type 2 diabetes mellitus with hyperglycemia ([place]) 10/06/2019  ? Hyperlipidemia associated with type 2 diabetes mellitus ([place]) 10/06/2019  ? Cerumen impaction 09/21/2018  ? Impacted cerumen of both ears 09/16/2018  ? Lower extremity edema 07/24/2018  ? DM (diabetes mellitus) type II uncontrolled, periph vascular disorder ([place]) 06/02/2018  ? Acute left-sided low back pain without sciatica 05/08/2018  ? Hyperlipidemia 01/29/2017  ? Colon cancer screening 10/21/2014  ? Dysuria 03/17/2014  ? Primary hypertension 03/05/2014  ? Routine general medical examination at a health  care facility 12/07/2013  ? Elevated blood pressure 12/07/2013  ? Frequent urination at night 12/07/2013  ? Nonspecific abnormal electrocardiogram (ECG) (EKG) 12/07/2013  ? CANDIDIASIS, VULVAR 05/12/2010  ? UTI 05/04/2010  ? DEGENERATIVE JOINT DISEASE 09/22/2008  ? Dysphagia 09/22/2008  ? Disorder of bone and cartilage 11/05/2007  ? TELANGIECTASIA 05/29/2007  ? HYPERCHOLESTEROLEMIA 02/01/2007  ?  PULMONARY FIBROSIS 02/01/2007  ? SARCOIDOSIS 11/20/2006  ? Asthma 11/20/2006  ?  ?  ?    ?Immunization History  ?Administered Date(s) Administered  ? PFIZER(Purple Top)SARS-COV-2 Vaccination 06/01/2019, 06/23/2019  ? Td 03/19/2002  ?  ?  ?Conditions to be addressed/monitored: ?HTN, HLD, DMII, and osteopenia, neuropathy, asthma ?  ?Care Plan : General Pharmacy (Adult)  ?Updates made by Cherre Robins, RPH-CPP since 06/05/2021 12:00 AM  ?  ? ?Problem: Pharmacy Care Plan - Medication and Chronic Care Management   ?Priority: High  ?Onset Date: 09/01/2020  ?  ? ?Long-Range Goal: Provide education, support and care coordination for medication therapy and chronic conditions   ?Start Date: 09/01/2020  ?This Visit's Progress: On track  ?Recent Progress: Not on track  ?Priority: High  ?Note:   ?Current Barriers:  ?Unable to achieve control of Hyperlipidemia  ?Unable to maintain control of type 2 DM and HTN ?Does not adhere to prescribed medication regimen ? ?Pharmacist Clinical Goal(s):  ?Over the next 120 days, patient will verbalize ability to afford treatment regimen ?achieve control of hyperlipidemia as evidenced by LDL <100 ?maintain control of HTN and type 2 DM as evidenced by BP <140/90 and A1c < 7.0%  ?adhere to prescribed medication regimen as evidenced by fill history and attainment of goals for chronic conditions  through collaboration with PharmD and provider.  ? ?Interventions: ?1:1 collaboration with Carollee Herter, Alferd Apa, DO regarding development and update of comprehensive plan of care as evidenced by provider attestation  and co-signature ?Inter-disciplinary care team collaboration (see longitudinal plan of care) ?Comprehensive medication review performed; medication list updated in electronic medical record ? ?Hypertensio

## 2021-06-05 NOTE — Patient Instructions (Signed)
Mrs. Garritano ?It was a pleasure speaking with you today.  ?I have attached a summary of our visit today and information about your health goals.  (See below for detailed care plan) ? ?Patient Goals/Self-Care Activities ?take medications as prescribed,  ?check glucose 3 to 4 times per week, document, and provide at future appointments,  ?check blood pressure daily, document, and provide at future appointments, and  ?engage in dietary modifications by limiting intake of sugar / carbohydrates and saturated fat ?Continue to take metamucil daily ?Try lidocaine 4% pain patch (Aspercream lidocaine patch or Salonpas patch) - apply to area of pain  ? ?If you have any questions or concerns, please feel free to contact me either at the phone number below or with a MyChart message.  ? ?Keep up the good work! ? ?Cherre Robins, PharmD ?Clinical Pharmacist ?[facility] ?[place] ?[phone] (direct line)  ?[phone] (main office number) ? ? ?Chronic Care Management Care Plan:  ? ?Hypertension ?BP Readings from Last 3 Encounters:  ?04/21/21 (!) 137/54  ?03/24/21 130/82  ?09/20/20 112/86  ? ?Pharmacist Clinical Goal(s): ?Over the next 90 days, patient will work with PharmD and providers to maintain BP goal <140/90 ?Current regimen:  ?Amlodipine 5 mg daily  ?Hydrochlorothiazide 25 mg daily ?Interventions: ?Requested patient to record blood pressure when checking daily ?Patient self care activities - Over the next 90 days, patient will: ?Check blood pressure daily, document, and provide at future appointments ?Ensure daily salt intake < 2300 mg/day ?Make sure to take amlodipine '5mg'$  every day. Consider using weekly pill containers to remind you. ? ?Hyperlipidemia ?Lipid Panel  ?   ?Component Value Date/Time  ? CHOL 292 (H) 03/24/2021 0947  ? TRIG 99.0 03/24/2021 0947  ? HDL 56.90 03/24/2021 0947  ? CHOLHDL 5 03/24/2021 0947  ? VLDL 19.8 03/24/2021 0947  ? [place] 215 (H) 03/24/2021 0947  ? LDLDIRECT 230.7  03/18/2013 0805  ? ?Pharmacist Clinical Goal(s): ?Over the next 180 days, patient will work with PharmD and providers to achieve LDL goal <100, Total Cholesterol (CHOL) goal <200 or prevent continue upward trend ?Current regimen:  ?None currently ?Interventions: ?Has not been able to tolerate statin medications in past ?Discussed importance of LDL goal ?Patient self care activities - Over the next 180 days, patient will: ?Reviewed ways to reduce dietary fat an dcholesterol intake in diet ?Continue to take Metamucil - helps with bowel movements and can lower cholesterol a little.  ? ?Diabetes ?Lab Results  ?Component Value Date/Time  ? HGBA1C 6.9 (H) 03/24/2021 09:47 AM  ? HGBA1C 7.0 (H) 09/20/2020 11:02 AM  ? ?Pharmacist Clinical Goal(s): ?Over the next 90 days, patient will work with PharmD and providers to maintain A1c goal <7% ?Current regimen:  ?Metformin XR 500 mg daily  ?Interventions: ?Discussed the importance of limiting carbohydrates (45 grams per meal).  ?Check blood sugar 3 to 4 times per week document, and provide at future appointments ?Contact provider with any episodes of hypoglycemia ?Continue metformin ER '500mg'$  daily with food - sent in updated prescription for you today ? ?Asthma ?Pharmacist Clinical Goal(s) ?Over the next 90 days, patient will work with PharmD and providers to reduce symptoms of asthma ?Current regimen:  ?Albuterol HFA 108 mcg 2 puffs every 6 hours as needed  ?Flovent 1108mg - inhaler 2 puffs twice a day ?Patient self care activities - Over the next 90 days, patient will: ?Continue current therapy for asthma control ?Contacted [facility] to coordinate refill for both albuterol and Flovent  inhaler. You should be able to pick up today 06/05/2021. ? ?Chronic Pain/Neuropathy ?Pharmacist Clinical Goal(s) ?Over the next 90 days, patient will work with PharmD and providers to reduce symptoms of pain/neuropathy ?Current regimen:  ?Meloxicam 7.'5mg'$  daily  ?Interventions: ?Continue to follow  up with Dr Jacqualyn Posey and primary care provider ?Consider trial of lidocaine 4% pain patch (Aspercream lidocaine patch or Salonpas patch) - apply to area of pain up to 12 hours as needed for back pain ? ?Health Maintenance:  ?Reviewed vaccination history and discussed benefits of annual flu vaccine, Tdap, Shingrix and Covid vaccinations ?Consider Tdap and Shingric in 2023 (anticipate better coverage through Medicare in 2023)  ? ?Medication management ?Pharmacist Clinical Goal(s): ?Over the next 90 days, patient will work with PharmD and providers to achieve optimal medication adherence ?Current pharmacy: [facility] ?Interventions ?Comprehensive medication review performed. ?Discussed using weekly pill container to help remember to take amlodipine EVERY day. ?Patient self care activities - Over the next 90 days, patient will: ?Focus on medication adherence by filling and taking medications appropriately  ?Take medications as prescribed ?Report any questions or concerns to PharmD and/or provider(s) ?[place] over the counter medication benefits at [phone]. You should get $50 per quarter to use for calcium and vitamin D supplements and other non prescription medications.   ? ?Patient Goals/Self-Care Activities ?take medications as prescribed,  ?check glucose 3 to 4 times per week, document, and provide at future appointments,  ?check blood pressure daily, document, and provide at future appointments, and  ?engage in dietary modifications by limiting intake of sugar / carbohydrates and saturated fat ?Continue to take metamucil daily ?Try lidocaine 4% pain patch (Aspercream lidocaine patch or Salonpas patch) - apply to area of pain  ? ?The patient verbalized understanding of instructions, educational materials, and care plan provided today and agreed to receive a mailed copy of patient instructions, educational materials, and care plan.   ?

## 2021-06-16 DIAGNOSIS — E1140 Type 2 diabetes mellitus with diabetic neuropathy, unspecified: Secondary | ICD-10-CM | POA: Diagnosis not present

## 2021-06-16 DIAGNOSIS — J4520 Mild intermittent asthma, uncomplicated: Secondary | ICD-10-CM

## 2021-06-16 DIAGNOSIS — E785 Hyperlipidemia, unspecified: Secondary | ICD-10-CM | POA: Diagnosis not present

## 2021-06-16 DIAGNOSIS — E1169 Type 2 diabetes mellitus with other specified complication: Secondary | ICD-10-CM | POA: Diagnosis not present

## 2021-06-16 DIAGNOSIS — I10 Essential (primary) hypertension: Secondary | ICD-10-CM

## 2021-07-03 ENCOUNTER — Ambulatory Visit (INDEPENDENT_AMBULATORY_CARE_PROVIDER_SITE_OTHER): Payer: Medicare Other | Admitting: Podiatry

## 2021-07-03 ENCOUNTER — Encounter: Payer: Self-pay | Admitting: Podiatry

## 2021-07-03 DIAGNOSIS — L608 Other nail disorders: Secondary | ICD-10-CM | POA: Diagnosis not present

## 2021-07-03 DIAGNOSIS — B351 Tinea unguium: Secondary | ICD-10-CM | POA: Diagnosis not present

## 2021-07-03 DIAGNOSIS — E1149 Type 2 diabetes mellitus with other diabetic neurological complication: Secondary | ICD-10-CM

## 2021-07-03 DIAGNOSIS — M79609 Pain in unspecified limb: Secondary | ICD-10-CM | POA: Diagnosis not present

## 2021-07-03 NOTE — Progress Notes (Signed)
This patient returns to my office for at risk foot care.  This patient requires this care by a professional since this patient will be at risk due to having type 2 diabetes.  This patient is unable to cut nails herself since the patient cannot reach her nails.These nails are painful walking and wearing shoes.  This patient presents for at risk foot care today. Patient states that her left big toenail is especially painful when sleeping.  General Appearance  Alert, conversant and in no acute stress.  Vascular  Dorsalis pedis pulses are palpable  bilaterally. Posterior tibial pulses are weakly palpable  B/L. Capillary return is within normal limits  bilaterally. Temperature is within normal limits  bilaterally.  Neurologic  Senn-Weinstein monofilament wire test diminished   bilaterally. Muscle power within normal limits bilaterally.  Nails Thick disfigured discolored nails with subungual debris  from hallux to fifth toes bilaterally. No evidence of bacterial infection or drainage bilaterally.  Pincer nails  B/Lm especially left hallux.  Orthopedic  No limitations of motion  feet .  No crepitus or effusions noted.  No bony pathology .  Hammer toes  B/L.  Skin  normotropic skin with no porokeratosis noted bilaterally.  No signs of infections or ulcers noted.     Onychomycosis  Pain in right toes  Pain in left toes  Consent was obtained for treatment procedures.   Mechanical debridement of nails 1-5  bilaterally performed with a nail nipper.  Filed with dremel without incident.    Return office visit    10 weeks                 Told patient to return for periodic foot care and evaluation due to potential at risk complications.   Brandis Wixted DPM  

## 2021-07-27 ENCOUNTER — Telehealth: Payer: Self-pay | Admitting: *Deleted

## 2021-07-27 NOTE — Telephone Encounter (Signed)
Spoke with patient and she stated someone left a message about scheduling an appt, but she is not due right now.  Reviewed chart and she is due on 09/21/21.  Patient declined to make an appointment and stated that she will call back. ?

## 2021-07-27 NOTE — Telephone Encounter (Signed)
Caller Name Anupama Piehl ?Caller Phone Number [phone] ?Call Type Message Only Information Provided ?Reason for Call Returning a Call from the Office ?Initial Comment Caller states she is returning a call. ?Additional Comment Please call back. ?Disp. Time Disposition Final User ?07/26/2021 5:02:10 PM General Information Provided Yes London Pepper, Ciar ?

## 2021-09-05 ENCOUNTER — Ambulatory Visit (INDEPENDENT_AMBULATORY_CARE_PROVIDER_SITE_OTHER): Payer: Medicare Other | Admitting: Pharmacist

## 2021-09-05 DIAGNOSIS — E1140 Type 2 diabetes mellitus with diabetic neuropathy, unspecified: Secondary | ICD-10-CM

## 2021-09-05 DIAGNOSIS — E1169 Type 2 diabetes mellitus with other specified complication: Secondary | ICD-10-CM

## 2021-09-05 DIAGNOSIS — I10 Essential (primary) hypertension: Secondary | ICD-10-CM

## 2021-09-05 DIAGNOSIS — J4520 Mild intermittent asthma, uncomplicated: Secondary | ICD-10-CM

## 2021-09-05 NOTE — Patient Instructions (Signed)
Felicia Martin It was a pleasure speaking with you  Below is a summary of your health goals and care plan   Patient Goals/Self-Care Activities  take medications as prescribed,  check glucose 3 to 4 times per week, document, and provide at future appointments,  check blood pressure daily, document, and provide at future appointments, and  engage in dietary modifications by limiting intake of sugar / carbohydrates and saturated fat Continue to take metamucil daily Call office in next 1 to 2 week to scheduled appointment with Dr Carollee Herter for July 2023 [phone])   Follow Up Plan: Follow up in 3 months   If you have any questions or concerns, please feel free to contact me either at the phone number below or with a MyChart message.   Keep up the good work!  Cherre Robins, PharmD Clinical Pharmacist [facility] [phone] (direct line)  [phone] (main office number)   The patient verbalized understanding of instructions, educational materials, and care plan provided today and agreed to receive a mailed copy of patient instructions, educational materials, and care plan.

## 2021-09-05 NOTE — Chronic Care Management (AMB) (Signed)
Chronic Care Management Pharmacy Note  09/05/2021 Name:  MEGHIN THIVIERGE MRN:  [id number] DOB:  [birth year]  Summary: Patient has tolerated restart of metformin and blood glucose has been at goal. Reports home blood glucose has 112 to 126.  Home blood pressure has been at goal. Per patient SBP 115 to 124 and DBP 60 to 70. Coordinated with pharmacy for amlodipine refill. Discussed importance of medication adherence.  Patient unable to tolerate statins or ezetimibe and other pharmacotherapy options are not affordable to patient declined (due to being injections)  Recommended she continue Metamucil daily for GI health and could lower LDL a little.   Recommended trial of over-the-counter lidocaine 4% patches for lower back pain at last visit - she tried Salonpas patches and helped a little. She started using arch supports in her shoes and this has helped with pain a lot since she started 2 days ago.   Subjective: Felicia Martin is an 88 y.o. year old female who is a primary patient of Ann Held, DO.  The CCM team was consulted for assistance with disease management and care coordination needs.    Engaged with patient by telephone for follow up visit in response to provider referral for pharmacy case management and/or care coordination services.   Consent to Services:  The patient was given information about Chronic Care Management services, agreed to services, and gave verbal consent prior to initiation of services.  Please see initial visit note for detailed documentation.   Patient Care Team: Carollee Herter, Alferd Apa, DO as PCP - General (Family Medicine) Inda Castle, MD (Inactive) as Consulting Physician (Gastroenterology) Cherre Robins, [place] (Pharmacist)   Recent Office Visits:  04/21/2021 - [facility] Olevia Bowens, NP) Seen for cough. Suspect viral UTI with cough. Prescribed benzonatate 222m twice a day as needed for cough and generic Augmentin 8712mtwice a day  for 10 days.  03/24/2021 - Fam Med (Dr LoCarollee HerterF/U HTN and diabetes. no med changes. refilled meloxicam and fluticasone inhaler  Recent Consults:  07/03/2021 - Podiatry (Dr MaPrudence DavidsonMechanical debridement of toenails. No medication changes.  03/23/2021 - Podiatry (Dr MaPrudence DavidsonMechanical debridement of nails 1-5  bilaterally performed with a nail nipper.  Filed with dremel without incident.    Hospital visits: None in previous 6 months  Objective:  Lab Results  Component Value Date   CREATININE 0.83 03/24/2021   CREATININE 0.81 09/20/2020   CREATININE 0.84 05/27/2020    Lab Results  Component Value Date   HGBA1C 6.9 (H) 03/24/2021   Last diabetic Eye exam:  Lab Results  Component Value Date/Time   HMDIABEYEEXA No Retinopathy 12/22/2020 12:00 AM    Last diabetic Foot exam: No results found for: "HMDIABFOOTEX"      Component Value Date/Time   CHOL 292 (H) 03/24/2021 0947   TRIG 99.0 03/24/2021 0947   HDL 56.90 03/24/2021 0947   CHOLHDL 5 03/24/2021 0947   VLDL 19.8 03/24/2021 0947   LDLCALC 215 (H) 03/24/2021 0947   LDLDIRECT 230.7 03/18/2013 0805       Latest Ref Rng & Units 03/24/2021    9:47 AM 09/20/2020   11:02 AM 05/27/2020    9:20 AM  Hepatic Function  Total Protein 6.0 - 8.3 g/dL 7.8  8.1  8.0   Albumin 3.5 - 5.2 g/dL 4.2  4.3  3.9   AST 0 - 37 U/L _0 ALT 0 - 35 U/L 14  9  8   Alk Phosphatase 39 - 117 U/L 63  62  65   Total Bilirubin 0.2 - 1.2 mg/dL 0.5  0.5  0.4     Lab Results  Component Value Date/Time   TSH 2.19 09/29/2009 03:47 PM   TSH 1.71 08/18/2007 09:14 AM       Latest Ref Rng & Units 12/16/2013    8:10 AM 08/06/2012    9:27 AM 05/28/2011   10:33 AM  CBC  WBC 4.0 - 10.5 K/uL 4.4  5.3  4.6   Hemoglobin 12.0 - 15.0 g/dL 12.2  12.5  12.0   Hematocrit 36.0 - 46.0 % 36.7  37.2  35.9   Platelets 150.0 - 400.0 K/uL 279.0  326.0  271.0     Lab Results  Component Value Date/Time   VD25OH 39 09/29/2009 10:48 PM   VD25OH 27 (L)  09/28/2008 09:02 PM    Clinical ASCVD: No  The ASCVD Risk score (Arnett DK, et al., 2019) failed to calculate for the following reasons:   The 2019 ASCVD risk score is only valid for ages 47 to 82     Social History   Tobacco Use  Smoking Status Former   Packs/day: 0.50   Years: 40.00   Total pack years: 20.00   Types: Cigarettes   Quit date: 05/25/1999   Years since quitting: 22.2  Smokeless Tobacco Never   BP Readings from Last 3 Encounters:  04/21/21 (!) 137/54  03/24/21 130/82  09/20/20 112/86   Pulse Readings from Last 3 Encounters:  04/21/21 78  03/24/21 91  09/20/20 83   Wt Readings from Last 3 Encounters:  04/21/21 214 lb 12.8 oz (97.4 kg)  03/24/21 213 lb (96.6 kg)  11/10/20 212 lb (96.2 kg)    Assessment: Review of patient past medical history, allergies, medications, health status, including review of consultants reports, laboratory and other test data, was performed as part of comprehensive evaluation and provision of chronic care management services.   SDOH:  (Social Determinants of Health) assessments and interventions performed:  SDOH Interventions    Flowsheet Row Most Recent Value  SDOH Interventions   Financial Strain Interventions Intervention Not Indicated  Physical Activity Interventions Other (Comments)  [exercises from physical therapy or working in [place]  Allergies  Allergen Reactions   Fenofibrate Rash   Statins Other (See Comments)    Muscle aches and weakness   Zetia [Ezetimibe] Other (See Comments)    Muscle pain   Farxiga [Dapagliflozin] Swelling    Patient didn't like increase in urinary frequency and reported swelling    Medications Reviewed Today     Reviewed by Cherre Robins, RPH-CPP (Pharmacist) on 09/05/21 at [place] List Status: <None>   Medication Order Taking? Sig Documenting Provider Last Dose Status Informant  albuterol (VENTOLIN HFA) 108 (90 Base) MCG/ACT inhaler [id number] Yes Inhale 2  puffs into the lungs every 6 (six) hours as needed. Carollee Herter, Kendrick Fries R, DO Taking Active   amLODipine (NORVASC) 5 MG tablet [id number] Yes TAKE 1 TABLET(5 MG) BY MOUTH DAILY Roma Schanz R, DO Taking Active   Blood Glucose Monitoring Suppl (ONETOUCH VERIO) w/Device KIT [id number] Yes Use as directed once a day.  Dx code: E11.9 Ann Held, DO Taking Active   fluticasone (FLOVENT HFA) 110 MCG/ACT inhaler [id number] Yes Inhale 2 puffs into the lungs 2 (two) times daily. Roma Schanz R, DO Taking Active   glucose  blood (ONETOUCH VERIO) test strip [id number] Yes Use to check blood pressure once a day (Dx: type 2 DM 11.9) Carollee Herter, Alferd Apa, DO Taking Active   hydrochlorothiazide (HYDRODIURIL) 25 MG tablet [id number] No Take 1 tablet (25 mg total) by mouth daily.  Patient not taking: Reported on 09/05/2021   Ann Held, DO Not Taking Active   Lancets (ONETOUCH DELICA PLUS OJJKKX38H) [place] [id number] Yes USE ONCE A DAY Carollee Herter, Alferd Apa, DO Taking Active   meloxicam (MOBIC) 7.5 MG tablet [id number] No Take 1 tablet (7.5 mg total) by mouth daily. Takes as needed  Patient not taking: Reported on 09/05/2021   Ann Held, DO Not Taking Active   Metamucil Fiber CHEW [id number] Yes Chew 1 each by mouth daily. [provider] Taking Active   metFORMIN (GLUCOPHAGE) 500 MG tablet [id number] Yes Take 1 tablet (500 mg total) by mouth daily with breakfast. Carollee Herter, Alferd Apa, DO Taking Active   naproxen sodium (ALEVE) 220 MG tablet [id number] Yes Take 220 mg by mouth daily as needed (back pain). [provider] Taking Active             Patient Active Problem List   Diagnosis Date Noted   Diabetic polyneuropathy associated with type 2 diabetes mellitus ([place]) 09/20/2020   Pincer nail deformity 08/17/2020   Statin intolerance 06/03/2020   DDD (degenerative disc disease), thoracolumbar 10/06/2019   Uncontrolled type 2 diabetes mellitus with  hyperglycemia ([place]) 10/06/2019   Hyperlipidemia associated with type 2 diabetes mellitus ([place]) 10/06/2019   Cerumen impaction 09/21/2018   Impacted cerumen of both ears 09/16/2018   Lower extremity edema 07/24/2018   DM (diabetes mellitus) type II uncontrolled, periph vascular disorder 06/02/2018   Acute left-sided low back pain without sciatica 05/08/2018   Hyperlipidemia 01/29/2017   Colon cancer screening 10/21/2014   Dysuria 03/17/2014   Primary hypertension 03/05/2014   Routine general medical examination at a health care facility 12/07/2013   Elevated blood pressure 12/07/2013   Frequent urination at night 12/07/2013   Nonspecific abnormal electrocardiogram (ECG) (EKG) 12/07/2013   CANDIDIASIS, VULVAR 05/12/2010   UTI 05/04/2010   DEGENERATIVE JOINT DISEASE 09/22/2008   Dysphagia 09/22/2008   Disorder of bone and cartilage 11/05/2007   TELANGIECTASIA 05/29/2007   HYPERCHOLESTEROLEMIA 02/01/2007   PULMONARY FIBROSIS 02/01/2007   SARCOIDOSIS 11/20/2006   Asthma 11/20/2006    Immunization History  Administered Date(s) Administered   PFIZER(Purple Top)SARS-COV-2 Vaccination 06/01/2019, 06/23/2019   Td 03/19/2002    Conditions to be addressed/monitored: HTN, HLD, DMII, and Asthma  Care Plan : General Pharmacy (Adult)  Updates made by Cherre Robins, RPH-CPP since 09/05/2021 12:00 AM     Problem: Pharmacy Care Plan - Medication and Chronic Care Management   Priority: High  Onset Date: 09/01/2020     Long-Range Goal: Provide education, support and care coordination for medication therapy and chronic conditions   Start Date: 09/01/2020  Recent Progress: On track  Priority: High  Note:   Current Barriers:  Unable to achieve control of Hyperlipidemia  Unable to maintain control of type 2 DM and HTN Does not adhere to prescribed medication regimen  Pharmacist Clinical Goal(s):  Over the next 120 days, patient will verbalize ability to afford treatment regimen achieve  control of hyperlipidemia as evidenced by LDL <100 maintain control of HTN and type 2 DM as evidenced by BP <140/90 and A1c < 7.0%  adhere to prescribed medication regimen as evidenced by fill history and attainment of  goals for chronic conditions  through collaboration with PharmD and provider.   Interventions: 1:1 collaboration with Carollee Herter, Alferd Apa, DO regarding development and update of comprehensive plan of care as evidenced by provider attestation and co-signature Inter-disciplinary care team collaboration (see longitudinal plan of care) Comprehensive medication review performed; medication list updated in electronic medical record  Hypertension Improved; last office BP at goal BP goal <140/90  BP Readings from Last 3 Encounters:  04/21/21 (!) 137/54  03/24/21 130/82  09/20/20 112/86  Home BP readings: 115 to 124 / 60 to 70 Denies symptoms of hypotension - no dizziness  Current regimen:  Amlodipine 5 mg daily  Hydrochlorothiazide 25 mg daily (takes if needed for swelling) Interventions: Reviewed blood pressure goals Discussed adherence with amlodipine. Reminded she should take EVERY day to keep blood pressure controlled and stable.  Coordinated with pharmacy for amlodipine refill Continue current regimen for hypertension Continue to check blood pressure daily; document, and provide at future appointments  Hyperlipidemia Not at goal but LDL did decrease a little with last assessment LDL goal <100, Total Cholesterol (CHOL) goal <200 (since unable to take statins - prevention of upward trend in lipids also a goal) Current regimen:  None currently Patient is intolerant to statin medications - tried Livalo, atorvastatin; pravastatin, rosuvastatin and simvastatin - all eventually caused myalgias Also unable to tolerate ezetimibe or fenofibrate - also caused myalgias Repatha recommended in past but was not started due to cost and patient declined to take injection Reviewed  patient's formulary. Both Repatha and praluent are on her formulary but are tier 3 - $47 / month and she also would have to meet $95 deductible which means first fill of the year would be $142 - patient states she cannot afford this.  Screened for LIS but patient income above limit Checked with Repatha Ready and patient does not qualify for their discount card and there is no patient assistance program. Usually will try to get funding from [facility] but currently no funding for [employer] for hyperlipidemia [employer] does have a patient assistance program and patinet's PCP OK'd trial of Praluent but when I discussed with patient, she declined due to [place] being an injection.  Reviewed to see if Nexletol was on formulary - it is not on her 2023 formulary. We could apply for [facility] and would provide $2500 per year but expect cost of Nexletol to be about $5500 per year if not covered on her insurance.  Interventions:  Discussed LDL goal Reviewed diet - recommending  she limit intake of saturated fat and cholesterol in diet.  See above for review of possible lipid lowering therapies and potential cost.  Patient has started Metamucil daily for constipation and this has helped with consitpation. Might also help lower cholesterol (but usually only minimal effect seen)  Diabetes Improved and A1c now 6.9%;  A1c goal <7% Current regimen:  Metformin 500 mg daily Earlier in 2022 she wanted to try Jardiance but cost was a barrier. She brought in medication assistance application in July but was denied. Representative stated patient's income of over limit. Yearly income must be <250%  of Federal Poverty Level (FPL) and patients is about 264%. Previously checked insurance plan. Has a $95 deductible to meet for branded medications, so first fill of Jardiance or Wilder Glade would be $95 + $47 = $142. Then cost would be $47 per month until reaches coverage gap. She is agreeable to  $47 but cannot afford the deductible. 11/2020: Patient tried  Wilder Glade but states she does not want to continued due to increased urinary frequency and she also felt [place] caused her to swell (this is unusual as Rosemary Holms usually helps with edema). We did complete and submit PAP app for [place] and I verified it was approved today through 03/18/2021 however no longer needed since patient has stopped [place].  Restarted metformin 11/2020. In the past she tried metformin 565m twice daily but caused nausea and she thought metformin caused her to breakout in rash. She has tolerated metformin without side effect over the last month Recent home BG readings - checking about 3 to 4 times per week Ranges from 112 - 126 Interventions: Recommended she continue to check blood sugar 3 to 4 times per week document, and provide at future appointments Continue metformin 503mdaily with food.  Continue to follow up with podiatrist for foot care.  Asthma Controlled;  goal: reduce symptoms of asthma and prevent exacerbations No recent exacerbations Using albuterol about twice per week Current regimen:  Albuterol HFA 108 mcg 2 puffs every 6 hours as needed Flovent 11056m- inhale two puffs into lungs twice a day.   Interventions:  Continue current therapy for asthma control  Chronic Pain/Neuropathy Controlled; goals:  reduce symptoms of pain/neuropathy Per patient neuropathy improved on it own without starting duloxetine Still having some back pain but has been better the last 3 days since she started using arch supports in her shoes.  Current regimen:  Over-the-counter Aleve 220m46mily if needed for pain Salonpas lidocaine 4% patches if needed for back pain.  Interventions:  Continue current therapy Continue to follow up with podiatrist   Health Maintenance:  Reviewed vaccination history and discussed benefits of annual flu vaccine, Tdap, Shingrix and Covid vaccinations Patient declined all  vaccinations.  Offered to make appointment for PCP follow up which is due in July 2023. 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 Continue current medication management strategy Reviewed adherence and discussed importance of taking medications as prescribed.  Coordinated with [facility] to fill amlodipine  Patient Goals/Self-Care Activities Over the next 90 days, patient will:  take medications as prescribed,  check glucose 3 to 4 times per week, document, and provide at future appointments,  check blood pressure daily, document, and provide at future appointments, and  engage in dietary modifications by limiting intake of sugar / carbohydrates and saturated fat Continue to take metamucil daily   Follow Up Plan:  Follow up in 3 months     Medication Assistance: None required.  Patient affirms current coverage meets needs.  [id number] [place] [id number]: [phone]: [phone] Up:  Patient agrees to Care Plan and Follow-up.  Plan: Telephone follow up appointment with care management team member scheduled for:  3 months  TammCherre RobinsarmD Clinical Pharmacist [facility]

## 2021-09-13 ENCOUNTER — Ambulatory Visit: Payer: Medicare Other | Admitting: Podiatry

## 2021-09-13 ENCOUNTER — Encounter: Payer: Self-pay | Admitting: Podiatry

## 2021-09-13 DIAGNOSIS — B351 Tinea unguium: Secondary | ICD-10-CM

## 2021-09-13 DIAGNOSIS — L608 Other nail disorders: Secondary | ICD-10-CM

## 2021-09-13 DIAGNOSIS — M79609 Pain in unspecified limb: Secondary | ICD-10-CM | POA: Diagnosis not present

## 2021-09-13 DIAGNOSIS — E1149 Type 2 diabetes mellitus with other diabetic neurological complication: Secondary | ICD-10-CM

## 2021-09-13 NOTE — Progress Notes (Signed)
This patient returns to my office for at risk foot care.  This patient requires this care by a professional since this patient will be at risk due to having type 2 diabetes.  This patient is unable to cut nails herself since the patient cannot reach her nails.These nails are painful walking and wearing shoes.  This patient presents for at risk foot care today. Patient states that her left big toenail is especially painful when sleeping.  General Appearance  Alert, conversant and in no acute stress.  Vascular  Dorsalis pedis pulses are palpable  bilaterally. Posterior tibial pulses are weakly palpable  B/L. Capillary return is within normal limits  bilaterally. Temperature is within normal limits  bilaterally.  Neurologic  Senn-Weinstein monofilament wire test diminished   bilaterally. Muscle power within normal limits bilaterally.  Nails Thick disfigured discolored nails with subungual debris  from hallux to fifth toes bilaterally. No evidence of bacterial infection or drainage bilaterally.  Pincer nails  B/Lm especially left hallux.  Orthopedic  No limitations of motion  feet .  No crepitus or effusions noted.  No bony pathology .  Hammer toes  B/L.  Skin  normotropic skin with no porokeratosis noted bilaterally.  No signs of infections or ulcers noted.     Onychomycosis  Pain in right toes  Pain in left toes  Consent was obtained for treatment procedures.   Mechanical debridement of nails 1-5  bilaterally performed with a nail nipper.  Filed with dremel without incident.    Return office visit    10 weeks                 Told patient to return for periodic foot care and evaluation due to potential at risk complications.   Gardiner Barefoot DPM

## 2021-09-15 DIAGNOSIS — J45909 Unspecified asthma, uncomplicated: Secondary | ICD-10-CM | POA: Diagnosis not present

## 2021-09-15 DIAGNOSIS — E785 Hyperlipidemia, unspecified: Secondary | ICD-10-CM

## 2021-09-15 DIAGNOSIS — I10 Essential (primary) hypertension: Secondary | ICD-10-CM | POA: Diagnosis not present

## 2021-09-15 DIAGNOSIS — Z7984 Long term (current) use of oral hypoglycemic drugs: Secondary | ICD-10-CM

## 2021-09-15 DIAGNOSIS — E1140 Type 2 diabetes mellitus with diabetic neuropathy, unspecified: Secondary | ICD-10-CM

## 2021-10-12 ENCOUNTER — Encounter: Payer: Self-pay | Admitting: Family Medicine

## 2021-10-12 ENCOUNTER — Ambulatory Visit (INDEPENDENT_AMBULATORY_CARE_PROVIDER_SITE_OTHER): Payer: Medicare Other | Admitting: Family Medicine

## 2021-10-12 VITALS — BP 132/80 | HR 72 | Temp 97.8°F | Resp 20 | Ht 60.0 in | Wt 213.4 lb

## 2021-10-12 DIAGNOSIS — E785 Hyperlipidemia, unspecified: Secondary | ICD-10-CM

## 2021-10-12 DIAGNOSIS — Z0000 Encounter for general adult medical examination without abnormal findings: Secondary | ICD-10-CM | POA: Diagnosis not present

## 2021-10-12 DIAGNOSIS — E1140 Type 2 diabetes mellitus with diabetic neuropathy, unspecified: Secondary | ICD-10-CM

## 2021-10-12 DIAGNOSIS — E1169 Type 2 diabetes mellitus with other specified complication: Secondary | ICD-10-CM | POA: Diagnosis not present

## 2021-10-12 DIAGNOSIS — I10 Essential (primary) hypertension: Secondary | ICD-10-CM | POA: Diagnosis not present

## 2021-10-12 DIAGNOSIS — J4520 Mild intermittent asthma, uncomplicated: Secondary | ICD-10-CM

## 2021-10-12 LAB — LIPID PANEL
Cholesterol: 326 mg/dL — ABNORMAL HIGH (ref 0–200)
HDL: 58.7 mg/dL (ref >39.00)
LDL Cholesterol: 240 mg/dL — ABNORMAL HIGH (ref 0–99)
NonHDL: 266.87
Total CHOL/HDL Ratio: 6
Triglycerides: 133 mg/dL (ref 0.0–149.0)
VLDL: 26.6 mg/dL (ref 0.0–40.0)

## 2021-10-12 LAB — CBC WITH DIFFERENTIAL/PLATELET
Basophils Absolute: 0 10*3/uL (ref 0.0–0.1)
Basophils Relative: 0.6 % (ref 0.0–3.0)
Eosinophils Absolute: 0.3 10*3/uL (ref 0.0–0.7)
Eosinophils Relative: 4.1 % (ref 0.0–5.0)
HCT: 38.8 % (ref 36.0–46.0)
Hemoglobin: 12.8 g/dL (ref 12.0–15.0)
Lymphocytes Relative: 30.4 % (ref 12.0–46.0)
Lymphs Abs: 1.9 10*3/uL (ref 0.7–4.0)
MCHC: 33.1 g/dL (ref 30.0–36.0)
MCV: 93.6 fl (ref 78.0–100.0)
Monocytes Absolute: 0.7 10*3/uL (ref 0.1–1.0)
Monocytes Relative: 10.4 % (ref 3.0–12.0)
Neutro Abs: 3.5 10*3/uL (ref 1.4–7.7)
Neutrophils Relative %: 54.5 % (ref 43.0–77.0)
Platelets: 309 10*3/uL (ref 150.0–400.0)
RBC: 4.14 Mil/uL (ref 3.87–5.11)
RDW: 13.4 % (ref 11.5–15.5)
WBC: 6.3 10*3/uL (ref 4.0–10.5)

## 2021-10-12 LAB — MICROALBUMIN / CREATININE URINE RATIO
Creatinine,U: 119.1 mg/dL
Microalb Creat Ratio: 0.8 mg/g (ref 0.0–30.0)
Microalb, Ur: 0.9 mg/dL (ref 0.0–1.9)

## 2021-10-12 LAB — COMPREHENSIVE METABOLIC PANEL
ALT: 9 U/L (ref 0–35)
AST: 15 U/L (ref 0–37)
Albumin: 4.4 g/dL (ref 3.5–5.2)
Alkaline Phosphatase: 61 U/L (ref 39–117)
BUN: 23 mg/dL (ref 6–23)
CO2: 26 mEq/L (ref 19–32)
Calcium: 10.3 mg/dL (ref 8.4–10.5)
Chloride: 102 mEq/L (ref 96–112)
Creatinine, Ser: 0.87 mg/dL (ref 0.40–1.20)
GFR: 60.2 mL/min (ref >60.00)
Glucose, Bld: 121 mg/dL — ABNORMAL HIGH (ref 70–99)
Potassium: 4.5 mEq/L (ref 3.5–5.1)
Sodium: 137 mEq/L (ref 135–145)
Total Bilirubin: 0.5 mg/dL (ref 0.2–1.2)
Total Protein: 8.4 g/dL — ABNORMAL HIGH (ref 6.0–8.3)

## 2021-10-12 LAB — HEMOGLOBIN A1C: Hgb A1c MFr Bld: 6.9 % — ABNORMAL HIGH (ref 4.6–6.5)

## 2021-10-12 NOTE — Patient Instructions (Signed)

## 2021-10-12 NOTE — Assessment & Plan Note (Signed)
Stable  con't inhalers 

## 2021-10-12 NOTE — Assessment & Plan Note (Signed)
Encourage heart healthy diet such as MIND or DASH diet, increase exercise, avoid trans fats, simple carbohydrates and processed foods, consider a krill or fish or flaxseed oil cap daily.  °

## 2021-10-12 NOTE — Progress Notes (Signed)
Subjective:   By signing my name below, I, Luiz Ochoa, attest that this documentation has been prepared under the direction and in the presence of Ann Held, DO  10/12/2021   Patient ID: Felicia Martin, female    DOB: [birth year], 88 y.o.   MRN: [id number]  Chief Complaint  Patient presents with   Diabetes   Hypertension   Follow-up    HPI Patient is in today for a follow up visit.  She is unable to go outside frequently due to her asthma, and reports that she had to use her albuterol this morning. She remains compliant with her Flovent and Albuterol. She uses metamucil which causes her to go to the bathroom more frequently.  Her blood pressure was 140/32 mmHg during the visit. At home today, she reports her blood pressure 112/70 mmHg.  She has trace pitting edema in her lower extremities.  Her blood sugars have been well managed, and reports her sugars range between 135 mg/dL as her highest and 105 mg/dL as her lowest.   Past Medical History:  Diagnosis Date   Asthma    GERD (gastroesophageal reflux disease)    Hypercholesterolemia    Hypertension    Pulmonary fibrosis ([place])    Sarcoidosis    Surgical history of tubal ligation 1960s    Past Surgical History:  Procedure Laterality Date   COLONOSCOPY  12/09/14   ESOPHAGOGASTRODUODENOSCOPY  12/09/14   LUNG SURGERY  1960   Right lung   Maloney dilation  12/09/14   TUBAL LIGATION  1960    Family History  Problem Relation Age of Onset   Diabetes Sister        x's 3   Alzheimer's disease Sister    Liver cancer Mother    Liver cancer Father    Cancer Father    Cancer Brother    Diabetes Brother    Hypertension Sister        x's 3   Hyperlipidemia Other    Breast cancer Neg Hx     Social History   Socioeconomic History   Marital status: Widowed    Spouse name: Not on file   Number of children: 4   Years of education: Not on file   Highest education level: Not on file  Occupational  History   Occupation: Chartered certified accountant  Tobacco Use   Smoking status: Former    Packs/day: 0.50    Years: 40.00    Total pack years: 20.00    Types: Cigarettes    Quit date: 05/25/1999    Years since quitting: 22.4   Smokeless tobacco: Never  Substance and Sexual Activity   Alcohol use: No   Drug use: No   Sexual activity: Never  Other Topics Concern   Not on file  Social History Narrative   Not on file   Social Determinants of Health   Financial Resource Strain: Low Risk  (09/05/2021)   Overall Financial Resource Strain (CARDIA)    Difficulty of Paying Living Expenses: Not hard at all  Food Insecurity: No Food Insecurity (11/10/2020)   Hunger Vital Sign    Worried About Running Out of Food in the Last Year: Never true    Ran Out of Food in the Last Year: Never true  Transportation Needs: No Transportation Needs (09/01/2020)   PRAPARE - Hydrologist (Medical): No    Lack of Transportation (Non-Medical): No  Physical Activity: Insufficiently Active (09/05/2021)   Exercise  Vital Sign    Days of Exercise per Week: 3 days    Minutes of Exercise per Session: 20 min  Stress: No Stress Concern Present (11/10/2020)   [place]    Feeling of Stress : Not at all  Social Connections: Moderately Isolated (11/10/2020)   Social Connection and Isolation Panel [NHANES]    Frequency of Communication with Friends and Family: More than three times a week    Frequency of Social Gatherings with Friends and Family: More than three times a week    Attends Religious Services: 1 to 4 times per year    Active Member of [employer] or Organizations: No    Attends Archivist Meetings: Never    Marital Status: Widowed  Intimate Partner Violence: Not At Risk (11/10/2020)   Humiliation, Afraid, Rape, and Kick questionnaire    Fear of Current or Ex-Partner: No    Emotionally Abused: No    Physically Abused: No     Sexually Abused: No    Outpatient Medications Prior to Visit  Medication Sig Dispense Refill   albuterol (VENTOLIN HFA) 108 (90 Base) MCG/ACT inhaler Inhale 2 puffs into the lungs every 6 (six) hours as needed. 18 g 2   amLODipine (NORVASC) 5 MG tablet TAKE 1 TABLET(5 MG) BY MOUTH DAILY 90 tablet 1   Blood Glucose Monitoring Suppl (ONETOUCH VERIO) w/Device KIT Use as directed once a day.  Dx code: E11.9 1 kit 0   fluticasone (FLOVENT HFA) 110 MCG/ACT inhaler Inhale 2 puffs into the lungs 2 (two) times daily. 1 each 12   glucose blood (ONETOUCH VERIO) test strip Use to check blood pressure once a day (Dx: type 2 DM 11.9) 100 strip 3   hydrochlorothiazide (HYDRODIURIL) 25 MG tablet Take 1 tablet (25 mg total) by mouth daily. 90 tablet 3   Lancets (ONETOUCH DELICA PLUS VIFBPP94F) MISC USE ONCE A DAY 100 each 1   meloxicam (MOBIC) 7.5 MG tablet Take 1 tablet (7.5 mg total) by mouth daily. Takes as needed 90 tablet 1   Metamucil Fiber CHEW Chew 1 each by mouth daily.     metFORMIN (GLUCOPHAGE) 500 MG tablet Take 1 tablet (500 mg total) by mouth daily with breakfast. 90 tablet 1   naproxen sodium (ALEVE) 220 MG tablet Take 220 mg by mouth daily as needed (back pain).     No facility-administered medications prior to visit.    Allergies  Allergen Reactions   Fenofibrate Rash   Statins Other (See Comments)    Muscle aches and weakness   Zetia [Ezetimibe] Other (See Comments)    Muscle pain   Farxiga [Dapagliflozin] Swelling    Patient didn't like increase in urinary frequency and reported swelling    Review of Systems  Constitutional:  Negative for fever.  HENT:  Negative for congestion, sinus pain and sore throat.   Respiratory:  Negative for cough, shortness of breath and wheezing.   Cardiovascular:  Negative for chest pain and palpitations.  Gastrointestinal:  Negative for abdominal pain, constipation, diarrhea, nausea and vomiting.  Genitourinary:  Negative for dysuria, frequency  and hematuria.  Musculoskeletal:  Negative for joint pain and myalgias.  Neurological:  Negative for headaches.       Objective:    Physical Exam Constitutional:      Appearance: Normal appearance. She is not ill-appearing.  HENT:     Head: Normocephalic and atraumatic.     Right Ear: External ear normal.  Left Ear: External ear normal.  Eyes:     Extraocular Movements: Extraocular movements intact.     Pupils: Pupils are equal, round, and reactive to light.  Cardiovascular:     Rate and Rhythm: Normal rate and regular rhythm.     Pulses: Normal pulses.     Heart sounds: Normal heart sounds. No murmur heard.    No gallop.  Pulmonary:     Effort: Pulmonary effort is normal. No respiratory distress.     Breath sounds: Normal breath sounds. No wheezing or rales.  Musculoskeletal:        General: Swelling present.     Right lower leg: Edema present.     Left lower leg: Edema present.  Skin:    General: Skin is warm and dry.  Neurological:     Mental Status: She is alert and oriented to person, place, and time.  Psychiatric:        Judgment: Judgment normal.     BP 132/80   Pulse 72   Temp 97.8 F (36.6 C) (Oral)   Resp 20   Ht 5' (1.524 m)   Wt 213 lb 6.4 oz (96.8 kg)   LMP 03/17/2014   SpO2 99%   BMI 41.68 kg/m  Wt Readings from Last 3 Encounters:  10/12/21 213 lb 6.4 oz (96.8 kg)  04/21/21 214 lb 12.8 oz (97.4 kg)  03/24/21 213 lb (96.6 kg)    Diabetic Foot Exam - Simple   No data filed    Lab Results  Component Value Date   WBC 4.4 12/16/2013   HGB 12.2 12/16/2013   HCT 36.7 12/16/2013   PLT 279.0 12/16/2013   GLUCOSE 122 (H) 03/24/2021   CHOL 292 (H) 03/24/2021   TRIG 99.0 03/24/2021   HDL 56.90 03/24/2021   LDLDIRECT 230.7 03/18/2013   LDLCALC 215 (H) 03/24/2021   ALT 14 03/24/2021   AST 18 03/24/2021   NA 139 03/24/2021   K 4.3 03/24/2021   CL 104 03/24/2021   CREATININE 0.83 03/24/2021   BUN 23 03/24/2021   CO2 26 03/24/2021   TSH  2.19 09/29/2009   HGBA1C 6.9 (H) 03/24/2021   MICROALBUR 0.9 10/12/2021    Lab Results  Component Value Date   TSH 2.19 09/29/2009   Lab Results  Component Value Date   WBC 4.4 12/16/2013   HGB 12.2 12/16/2013   HCT 36.7 12/16/2013   MCV 92.0 12/16/2013   PLT 279.0 12/16/2013   Lab Results  Component Value Date   NA 139 03/24/2021   K 4.3 03/24/2021   CO2 26 03/24/2021   GLUCOSE 122 (H) 03/24/2021   BUN 23 03/24/2021   CREATININE 0.83 03/24/2021   BILITOT 0.5 03/24/2021   ALKPHOS 63 03/24/2021   AST 18 03/24/2021   ALT 14 03/24/2021   PROT 7.8 03/24/2021   ALBUMIN 4.2 03/24/2021   CALCIUM 10.0 03/24/2021   GFR 63.95 03/24/2021   Lab Results  Component Value Date   CHOL 292 (H) 03/24/2021   Lab Results  Component Value Date   HDL 56.90 03/24/2021   Lab Results  Component Value Date   LDLCALC 215 (H) 03/24/2021   Lab Results  Component Value Date   TRIG 99.0 03/24/2021   Lab Results  Component Value Date   CHOLHDL 5 03/24/2021   Lab Results  Component Value Date   HGBA1C 6.9 (H) 03/24/2021       Assessment & Plan:   Problem List Items Addressed This Visit  Unprioritized   Routine general medical examination at a health care facility    hgba1c to be checked, minimize simple carbs. Increase exercise as tolerated. Continue current meds      Primary hypertension    Well controlled, no changes to meds. Encouraged heart healthy diet such as the DASH diet and exercise as tolerated.       Relevant Orders   CBC with Differential/Platelet   Comprehensive metabolic panel   Lipid panel   Hemoglobin A1c   Microalbumin / creatinine urine ratio (Completed)   Hyperlipidemia associated with type 2 diabetes mellitus (HCC)    Encourage heart healthy diet such as MIND or DASH diet, increase exercise, avoid trans fats, simple carbohydrates and processed foods, consider a krill or fish or flaxseed oil cap daily.       Relevant Orders   CBC with  Differential/Platelet   Comprehensive metabolic panel   Lipid panel   Hemoglobin A1c   Microalbumin / creatinine urine ratio (Completed)   Asthma    Stable  con't inhalers       Other Visit Diagnoses     Type 2 diabetes mellitus with diabetic neuropathy, without long-term current use of insulin ([place])    -  Primary   Relevant Orders   CBC with Differential/Platelet   Comprehensive metabolic panel   Lipid panel   Hemoglobin A1c   Microalbumin / creatinine urine ratio (Completed)        No orders of the defined types were placed in this encounter.   IAnn Held, DO, personally preformed the services described in this documentation.  All medical record entries made by the scribe were at my direction and in my presence.  I have reviewed the chart and discharge instructions (if applicable) and agree that the record reflects my personal performance and is accurate and complete. 10/12/2021   I,Tinashe Williams,acting as a scribe for Ann Held, DO.,have documented all relevant documentation on the behalf of Ann Held, DO,as directed by  Ann Held, DO while in the presence of Ann Held, DO.     Ann Held, DO

## 2021-10-12 NOTE — Assessment & Plan Note (Signed)
hgba1c to be checked, minimize simple carbs. Increase exercise as tolerated. Continue current meds  

## 2021-10-12 NOTE — Assessment & Plan Note (Signed)
Well controlled, no changes to meds. Encouraged heart healthy diet such as the DASH diet and exercise as tolerated.  °

## 2021-10-19 ENCOUNTER — Telehealth: Payer: Self-pay | Admitting: Family Medicine

## 2021-10-19 NOTE — Telephone Encounter (Signed)
Pt called to go over labs from appt on 7.27.23. Advised pt that Dr. Etter Sjogren had not had a chance to Final Result them yet. Pt asked if a note could be sent back to Dr. Etter Sjogren to look into those when she could and have a call back to go over the results.

## 2021-10-20 NOTE — Telephone Encounter (Signed)
Labs not reviewed yet

## 2021-10-24 ENCOUNTER — Other Ambulatory Visit: Payer: Self-pay | Admitting: Family Medicine

## 2021-10-24 DIAGNOSIS — E1169 Type 2 diabetes mellitus with other specified complication: Secondary | ICD-10-CM

## 2021-10-24 DIAGNOSIS — E1140 Type 2 diabetes mellitus with diabetic neuropathy, unspecified: Secondary | ICD-10-CM

## 2021-10-24 DIAGNOSIS — I10 Essential (primary) hypertension: Secondary | ICD-10-CM

## 2021-10-25 ENCOUNTER — Other Ambulatory Visit: Payer: Self-pay

## 2021-10-25 MED ORDER — METFORMIN HCL 500 MG PO TABS: 500.0000 mg | ORAL_TABLET | Freq: Two times a day (BID) | ORAL | 2 refills | Status: DC

## 2021-11-06 ENCOUNTER — Telehealth: Payer: Self-pay | Admitting: Family Medicine

## 2021-11-06 NOTE — Telephone Encounter (Signed)
Labs mailed on 10/25/21.Another result letter mailed today

## 2021-11-06 NOTE — Telephone Encounter (Signed)
I called patient to schedule her AWV.  Patient said she hasn't received a copy of her lab results.  Can you please send her another copy of her lab results?

## 2021-11-23 ENCOUNTER — Ambulatory Visit (INDEPENDENT_AMBULATORY_CARE_PROVIDER_SITE_OTHER): Payer: Medicare Other

## 2021-11-23 VITALS — Ht 60.0 in | Wt 213.0 lb

## 2021-11-23 DIAGNOSIS — Z780 Asymptomatic menopausal state: Secondary | ICD-10-CM | POA: Diagnosis not present

## 2021-11-23 DIAGNOSIS — Z0000 Encounter for general adult medical examination without abnormal findings: Secondary | ICD-10-CM | POA: Diagnosis not present

## 2021-11-23 NOTE — Patient Instructions (Signed)
Ms. Felicia Martin , Thank you for taking time to complete your Medicare Wellness Visit. I appreciate your ongoing commitment to your health goals. Please review the following plan we discussed and let me know if I can assist you in the future.   Screening recommendations/referrals: Colonoscopy: No longer required Mammogram: Completed 04/07/2021-Due 04/07/2022 Bone Density: Ordered today. Someone will call you to schedule. Recommended yearly ophthalmology/optometry visit for glaucoma screening and checkup Recommended yearly dental visit for hygiene and checkup  Vaccinations: Influenza vaccine: Declined Pneumococcal vaccine: Declined Tdap vaccine: Declined Shingles vaccine: Declined   Covid-19:May obtain vaccine at your local pharmacy.   Advanced directives: Please bring a copy of Living Will and/or Healthcare Power of Attorney for your chart.   Conditions/risks identified: See problem list  Next appointment: Follow up in one year for your annual wellness visit    Preventive Care 65 Years and Older, Female Preventive care refers to lifestyle choices and visits with your health care provider that can promote health and wellness. What does preventive care include? A yearly physical exam. This is also called an annual well check. Dental exams once or twice a year. Routine eye exams. Ask your health care provider how often you should have your eyes checked. Personal lifestyle choices, including: Daily care of your teeth and gums. Regular physical activity. Eating a healthy diet. Avoiding tobacco and drug use. Limiting alcohol use. Practicing safe sex. Taking low-dose aspirin every day. Taking vitamin and mineral supplements as recommended by your health care provider. What happens during an annual well check? The services and screenings done by your health care provider during your annual well check will depend on your age, overall health, lifestyle risk factors, and family history of  disease. Counseling  Your health care provider may ask you questions about your: Alcohol use. Tobacco use. Drug use. Emotional well-being. Home and relationship well-being. Sexual activity. Eating habits. History of falls. Memory and ability to understand (cognition). Work and work Statistician. Reproductive health. Screening  You may have the following tests or measurements: Height, weight, and BMI. Blood pressure. Lipid and cholesterol levels. These may be checked every 5 years, or more frequently if you are over 25 years old. Skin check. Lung cancer screening. You may have this screening every year starting at age 14 if you have a 30-pack-year history of smoking and currently smoke or have quit within the past 15 years. Fecal occult blood test (FOBT) of the stool. You may have this test every year starting at age 69. Flexible sigmoidoscopy or colonoscopy. You may have a sigmoidoscopy every 5 years or a colonoscopy every 10 years starting at age 3. Hepatitis C blood test. Hepatitis B blood test. Sexually transmitted disease (STD) testing. Diabetes screening. This is done by checking your blood sugar (glucose) after you have not eaten for a while (fasting). You may have this done every 1-3 years. Bone density scan. This is done to screen for osteoporosis. You may have this done starting at age 44. Mammogram. This may be done every 1-2 years. Talk to your health care provider about how often you should have regular mammograms. Talk with your health care provider about your test results, treatment options, and if necessary, the need for more tests. Vaccines  Your health care provider may recommend certain vaccines, such as: Influenza vaccine. This is recommended every year. Tetanus, diphtheria, and acellular pertussis (Tdap, Td) vaccine. You may need a Td booster every 10 years. Zoster vaccine. You may need this after age 62. Pneumococcal  13-valent conjugate (PCV13) vaccine. One  dose is recommended after age 76. Pneumococcal polysaccharide (PPSV23) vaccine. One dose is recommended after age 58. Talk to your health care provider about which screenings and vaccines you need and how often you need them. This information is not intended to replace advice given to you by your health care provider. Make sure you discuss any questions you have with your health care provider. Document Released: 04/01/2015 Document Revised: 11/23/2015 Document Reviewed: 01/04/2015 Elsevier Interactive Patient Education  2017 [place] [facility] [place] Prevention in the Home Falls can cause injuries. They can happen to people of all ages. There are many things you can do to make your home safe and to help prevent falls. What can I do on the outside of my home? Regularly fix the edges of walkways and driveways and fix any cracks. Remove anything that might make you trip as you walk through a door, such as a raised step or threshold. Trim any bushes or trees on the path to your home. Use bright outdoor lighting. Clear any walking paths of anything that might make someone trip, such as rocks or tools. Regularly check to see if handrails are loose or broken. Make sure that both sides of any steps have handrails. Any raised decks and porches should have guardrails on the edges. Have any leaves, snow, or ice cleared regularly. Use sand or salt on walking paths during winter. Clean up any spills in your garage right away. This includes oil or grease spills. What can I do in the bathroom? Use night lights. Install grab bars by the toilet and in the tub and shower. Do not use towel bars as grab bars. Use non-skid mats or decals in the tub or shower. If you need to sit down in the shower, use a plastic, non-slip stool. Keep the floor dry. Clean up any water that spills on the floor as soon as it happens. Remove soap buildup in the tub or shower regularly. Attach bath mats securely with double-sided  non-slip rug tape. Do not have throw rugs and other things on the floor that can make you trip. What can I do in the bedroom? Use night lights. Make sure that you have a light by your bed that is easy to reach. Do not use any sheets or blankets that are too big for your bed. They should not hang down onto the floor. Have a firm chair that has side arms. You can use this for support while you get dressed. Do not have throw rugs and other things on the floor that can make you trip. What can I do in the kitchen? Clean up any spills right away. Avoid walking on wet floors. Keep items that you use a lot in easy-to-reach places. If you need to reach something above you, use a strong step stool that has a grab bar. Keep electrical cords out of the way. Do not use floor polish or wax that makes floors slippery. If you must use wax, use non-skid floor wax. Do not have throw rugs and other things on the floor that can make you trip. What can I do with my stairs? Do not leave any items on the stairs. Make sure that there are handrails on both sides of the stairs and use them. Fix handrails that are broken or loose. Make sure that handrails are as long as the stairways. Check any carpeting to make sure that it is firmly attached to the stairs. Fix any carpet  that is loose or worn. Avoid having throw rugs at the top or bottom of the stairs. If you do have throw rugs, attach them to the floor with carpet tape. Make sure that you have a light switch at the top of the stairs and the bottom of the stairs. If you do not have them, ask someone to add them for you. What else can I do to help prevent falls? Wear shoes that: Do not have high heels. Have rubber bottoms. Are comfortable and fit you well. Are closed at the toe. Do not wear sandals. If you use a stepladder: Make sure that it is fully opened. Do not climb a closed stepladder. Make sure that both sides of the stepladder are locked into place. Ask  someone to hold it for you, if possible. Clearly mark and make sure that you can see: Any grab bars or handrails. First and last steps. Where the edge of each step is. Use tools that help you move around (mobility aids) if they are needed. These include: Canes. Walkers. Scooters. Crutches. Turn on the lights when you go into a dark area. Replace any light bulbs as soon as they burn out. Set up your furniture so you have a clear path. Avoid moving your furniture around. If any of your floors are uneven, fix them. If there are any pets around you, be aware of where they are. Review your medicines with your doctor. Some medicines can make you feel dizzy. This can increase your chance of falling. Ask your doctor what other things that you can do to help prevent falls. This information is not intended to replace advice given to you by your health care provider. Make sure you discuss any questions you have with your health care provider. Document Released: 12/30/2008 Document Revised: 08/11/2015 Document Reviewed: 04/09/2014 Elsevier Interactive Patient Education  2017 [employer].

## 2021-11-23 NOTE — Progress Notes (Signed)
Subjective:   Felicia Martin is a 88 y.o. female who presents for Medicare Annual (Subsequent) preventive examination.  I connected with Demi today by telephone and verified that I am speaking with the correct person using two identifiers. Location patient: home Location provider: work Persons participating in the virtual visit: patient, Marine scientist.    I discussed the limitations, risks, security and privacy concerns of performing an evaluation and management service by telephone and the availability of in person appointments. I also discussed with the patient that there may be a patient responsible charge related to this service. The patient expressed understanding and verbally consented to this telephonic visit.    Interactive audio and video telecommunications were attempted between this provider and patient, however failed, due to patient having technical difficulties OR patient did not have access to video capability.  We continued and completed visit with audio only.  Some vital signs may be absent or patient reported.   Time Spent with patient on telephone encounter: 30 minutes   Review of Systems     Cardiac Risk Factors include: advanced age (>49mn, >>65women);hypertension;diabetes mellitus;dyslipidemia;obesity (BMI >30kg/m2)     Objective:    Today's Vitals   11/23/21 0831  Weight: 213 lb (96.6 kg)  Height: 5' (1.524 m)   Body mass index is 41.6 kg/m.     11/23/2021    8:33 AM 11/10/2020    8:28 AM 04/30/2019   10:24 AM 04/28/2018    8:18 AM 08/09/2017    9:10 AM 03/26/2017    1:07 PM 07/18/2016    2:08 PM  Advanced Directives  Does Patient Have a Medical Advance Directive? Yes Yes Yes Yes No Yes Yes  Type of AParamedicof ALe FloreLiving will HNew HopeLiving will HVineyardLiving will HVanduserLiving will  HInvernessLiving will   Does patient want to make  changes to medical advance directive?   No - Patient declined No - Patient declined  No - Patient declined No - Patient declined  Copy of HLa [place]in Chart? No - copy requested No - copy requested No - copy requested No - copy requested  No - copy requested   Would patient like information on creating a medical advance directive?     No - Patient declined      Current Medications (verified) Outpatient Encounter Medications as of 11/23/2021  Medication Sig   albuterol (VENTOLIN HFA) 108 (90 Base) MCG/ACT inhaler Inhale 2 puffs into the lungs every 6 (six) hours as needed.   amLODipine (NORVASC) 5 MG tablet TAKE 1 TABLET(5 MG) BY MOUTH DAILY   Blood Glucose Monitoring Suppl (ONETOUCH VERIO) w/Device KIT Use as directed once a day.  Dx code: E11.9   fluticasone (FLOVENT HFA) 110 MCG/ACT inhaler Inhale 2 puffs into the lungs 2 (two) times daily.   glucose blood (ONETOUCH VERIO) test strip Use to check blood pressure once a day (Dx: type 2 DM 11.9)   hydrochlorothiazide (HYDRODIURIL) 25 MG tablet Take 1 tablet (25 mg total) by mouth daily.   Lancets (ONETOUCH DELICA PLUS LOINOMV67M MISC USE ONCE A DAY   meloxicam (MOBIC) 7.5 MG tablet Take 1 tablet (7.5 mg total) by mouth daily. Takes as needed   Metamucil Fiber CHEW Chew 1 each by mouth daily.   metFORMIN (GLUCOPHAGE) 500 MG tablet Take 1 tablet (500 mg total) by mouth 2 (two) times daily with a meal.   naproxen sodium (  ALEVE) 220 MG tablet Take 220 mg by mouth daily as needed (back pain).   No facility-administered encounter medications on file as of 11/23/2021.    Allergies (verified) Fenofibrate, Statins, Zetia [ezetimibe], and Farxiga [dapagliflozin]   History: Past Medical History:  Diagnosis Date   Asthma    GERD (gastroesophageal reflux disease)    Hypercholesterolemia    Hypertension    Pulmonary fibrosis ([place])    Sarcoidosis    Surgical history of tubal ligation 1960s   Past Surgical History:  Procedure  Laterality Date   COLONOSCOPY  12/09/14   ESOPHAGOGASTRODUODENOSCOPY  12/09/14   LUNG SURGERY  1960   Right lung   Maloney dilation  12/09/14   TUBAL LIGATION  1960   Family History  Problem Relation Age of Onset   Diabetes Sister        x's 3   Alzheimer's disease Sister    Liver cancer Mother    Liver cancer Father    Cancer Father    Cancer Brother    Diabetes Brother    Hypertension Sister        x's 3   Hyperlipidemia Other    Breast cancer Neg Hx    Social History   Socioeconomic History   Marital status: Widowed    Spouse name: Not on file   Number of children: 4   Years of education: Not on file   Highest education level: Not on file  Occupational History   Occupation: Chartered certified accountant  Tobacco Use   Smoking status: Former    Packs/day: 0.50    Years: 40.00    Total pack years: 20.00    Types: Cigarettes    Quit date: 05/25/1999    Years since quitting: 22.5   Smokeless tobacco: Never  Substance and Sexual Activity   Alcohol use: No   Drug use: No   Sexual activity: Never  Other Topics Concern   Not on file  Social History Narrative   Not on file   Social Determinants of Health   Financial Resource Strain: Low Risk  (11/23/2021)   Overall Financial Resource Strain (CARDIA)    Difficulty of Paying Living Expenses: Not hard at all  Food Insecurity: No Food Insecurity (11/23/2021)   Hunger Vital Sign    Worried About Running Out of Food in the Last Year: Never true    [place] in the Last Year: Never true  Transportation Needs: No Transportation Needs (09/01/2020)   PRAPARE - Hydrologist (Medical): No    Lack of Transportation (Non-Medical): No  Physical Activity: Inactive (11/23/2021)   Exercise Vital Sign    Days of Exercise per Week: 0 days    Minutes of Exercise per Session: 0 min  Stress: No Stress Concern Present (11/10/2020)   [place]    Feeling of  Stress : Not at all  Social Connections: Socially Isolated (11/23/2021)   Social Connection and Isolation Panel [NHANES]    Frequency of Communication with Friends and Family: More than three times a week    Frequency of Social Gatherings with Friends and Family: More than three times a week    Attends Religious Services: Never    Marine scientist or Organizations: No    Attends Archivist Meetings: Never    Marital Status: Widowed    Tobacco Counseling Counseling given: Not Answered   Clinical Intake:  Pre-visit preparation completed:  Yes  Pain : No/denies pain     BMI - recorded: 41.6 Nutritional Status: BMI > 30  Obese Nutritional Risks: None Diabetes: Yes CBG done?: No Did pt. bring in CBG monitor from home?: No (phone visit)  How often do you need to have someone help you when you read instructions, pamphlets, or other written materials from your doctor or pharmacy?: 1 - Never  Diabetes:  Is the patient diabetic?  Yes  If diabetic, was a CBG obtained today?  No  Did the patient bring in their glucometer from home?  No phone visit How often do you monitor your CBG's? daily.   Financial Strains and Diabetes Management:  Are you having any financial strains with the device, your supplies or your medication? No .  Does the patient want to be seen by Chronic Care Management for management of their diabetes?  No  Would the patient like to be referred to a Nutritionist or for Diabetic Management?  No   Diabetic Exams:  Diabetic Eye Exam: Completed 12/22/2020.   Diabetic Foot Exam: Completed 09/13/2021.   Interpreter Needed?: No  Information entered by :: Caroleen Hamman LPN   Activities of Daily Living    11/23/2021    8:37 AM  In your present state of health, do you have any difficulty performing the following activities:  Hearing? 1  Comment hearing aids  Vision? 0  Difficulty concentrating or making decisions? 0  Walking or climbing stairs? 0   Dressing or bathing? 0  Doing errands, shopping? 0  Preparing Food and eating ? N  Using the Toilet? N  In the past six months, have you accidently leaked urine? Y  Comment occasionally  Do you have problems with loss of bowel control? N  Managing your Medications? N  Managing your Finances? N  Housekeeping or managing your Housekeeping? N    Patient Care Team: Carollee Herter, Alferd Apa, DO as PCP - General (Family Medicine) Inda Castle, MD (Inactive) as Consulting Physician (Gastroenterology) Cherre Robins, [place] (Pharmacist)  Indicate any recent Medical Services you may have received from other than [facility] providers in the past year (date may be approximate).     Assessment:   This is a routine wellness examination for Xylina.  Hearing/Vision screen Hearing Screening - Comments:: Bilateral hearing aids Vision Screening - Comments:: Last eye exam-12/22/20-[facility]  Dietary issues and exercise activities discussed: Current Exercise Habits: The patient does not participate in regular exercise at present, Exercise limited by: None identified   Goals Addressed               This Visit's Progress     Patient Stated     Continue to improve diet.   (pt-stated)   On track      Depression Screen    11/23/2021    8:37 AM 11/10/2020    8:31 AM 05/27/2020    8:32 AM 04/30/2019   10:30 AM 04/28/2018    8:20 AM 03/26/2017    1:07 PM 07/18/2016    2:09 PM  PHQ 2/9 Scores  PHQ - 2 Score 0 0 0 0 0 0 0    Fall Risk    11/23/2021    8:35 AM 11/10/2020    8:30 AM 09/20/2020   10:44 AM 05/27/2020    8:32 AM 04/30/2019   10:29 AM  Fall Risk   Falls in the past year? 0 0 0 0 0  Number falls in past yr: 0 0 0 0  0  Injury with Fall? 0 0 0 0 0  Risk for fall due to :   Impaired mobility    Follow up Falls prevention discussed Falls prevention discussed Falls evaluation completed Falls evaluation completed Education provided;Falls prevention discussed    FALL RISK PREVENTION  PERTAINING TO THE HOME:  Any stairs in or around the home? No  Home free of loose throw rugs in walkways, pet beds, electrical cords, etc? Yes  Adequate lighting in your home to reduce risk of falls? Yes   ASSISTIVE DEVICES UTILIZED TO PREVENT FALLS:  Life alert? Yes  Use of a cane, walker or w/c? No  Grab bars in the bathroom? Yes  Shower chair or bench in shower? Yes  Elevated toilet seat or a handicapped toilet? No   TIMED UP AND GO:  Was the test performed? No . Phone visit   Cognitive Function:    03/26/2017    1:07 PM 02/23/2016   11:19 AM 02/23/2015    9:02 AM  MMSE - Mini Mental State Exam  Orientation to time _0 Orientation to Place _1 Registration _2 Attention/ Calculation _3 Recall _4 Language- name 2 objects _5 Language- repeat _6 Language- follow 3 step command _7 Language- read & follow direction _8 Write a sentence _9 Copy design _10 Total score _11 11/23/2021    8:53 AM  6CIT Screen  What Year? 0 points  What month? 0 points  What time? 0 points  Count back from 20 0 points  Months in reverse 0 points  Repeat phrase 4 points  Total Score 4 points    Immunizations Immunization History  Administered Date(s) Administered   PFIZER(Purple Top)SARS-COV-2 Vaccination 06/01/2019, 06/23/2019   Td 03/19/2002    TDAP status: Due, Education has been provided regarding the importance of this vaccine. Advised may receive this vaccine at local pharmacy or Health Dept. Aware to provide a copy of the vaccination record if obtained from local pharmacy or Health Dept. Verbalized acceptance and understanding.  Flu Vaccine status: Due, Education has been provided regarding the importance of this vaccine. Advised may receive this vaccine at local pharmacy or Health Dept. Aware to provide a copy of the vaccination record if obtained from local pharmacy or Health Dept. Verbalized acceptance and  understanding.  Pneumococcal vaccine status: Due, Education has been provided regarding the importance of this vaccine. Advised may receive this vaccine at local pharmacy or Health Dept. Aware to provide a copy of the vaccination record if obtained from local pharmacy or Health Dept. Verbalized acceptance and understanding.  Covid-19 vaccine status: Information provided on how to obtain vaccines.   Qualifies for Shingles Vaccine? Yes   Zostavax completed No   Shingrix Completed?: No.    Education has been provided regarding the importance of this vaccine. Patient has been advised to call insurance company to determine out of pocket expense if they have not yet received this vaccine. Advised may also receive vaccine at local pharmacy or Health Dept. Verbalized acceptance and understanding.  Screening Tests Health Maintenance  Topic Date Due   Zoster Vaccines- Shingrix (1 of 2) Never done   COVID-19 Vaccine (3 - Pfizer series) 08/18/2019   TETANUS/TDAP  03/18/2022 (Originally 03/19/2012)   Pneumonia Vaccine 51+ Years old (1 -  PCV) 03/24/2022 (Originally 01/23/2000)   INFLUENZA VACCINE  06/17/2026 (Originally 10/17/2021)   OPHTHALMOLOGY EXAM  12/22/2021   MAMMOGRAM  04/07/2022   HEMOGLOBIN A1C  04/14/2022   FOOT EXAM  09/14/2022   URINE MICROALBUMIN  10/13/2022   DEXA SCAN  Completed   HPV VACCINES  Aged Out    Health Maintenance  Health Maintenance Due  Topic Date Due   Zoster Vaccines- Shingrix (1 of 2) Never done   COVID-19 Vaccine (3 - Pfizer series) 08/18/2019    Colorectal cancer screening: No longer required.   Mammogram status: Completed bilateral 04/07/2021. Repeat every year  Bone Density status: Ordered today. Pt provided with contact info and advised to call to schedule appt.  Lung Cancer Screening: (Low Dose CT Chest recommended if Age 37-80 years, 30 pack-year currently smoking OR have quit w/in 15years.) does not qualify.     Additional Screening:  Hepatitis C  Screening: does not qualify  Vision Screening: Recommended annual ophthalmology exams for early detection of glaucoma and other disorders of the eye. Is the patient up to date with their annual eye exam?  Yes  Who is the provider or what is the name of the office in which the patient attends annual eye exams? [facility]   Dental Screening: Recommended annual dental exams for proper oral hygiene  Community Resource Referral / Chronic Care Management: CRR required this visit?  No   CCM required this visit?  No      Plan:     I have personally reviewed and noted the following in the patient's chart:   Medical and social history Use of alcohol, tobacco or illicit drugs  Current medications and supplements including opioid prescriptions. Patient is not currently taking opioid prescriptions. Functional ability and status Nutritional status Physical activity Advanced directives List of other physicians Hospitalizations, surgeries, and ER visits in previous 12 months Vitals Screenings to include cognitive, depression, and falls Referrals and appointments  In addition, I have reviewed and discussed with patient certain preventive protocols, quality metrics, and best practice recommendations. A written personalized care plan for preventive services as well as general preventive health recommendations were provided to patient.   Due to this being a telephonic visit, the after visit summary with patients personalized plan was offered to patient via mail or my-chart. Patient to pick up at office at next visit.   Marta Antu, LPN   07/21/979  Nurse health Advisor  Nurse Notes: None

## 2021-11-24 ENCOUNTER — Telehealth (HOSPITAL_BASED_OUTPATIENT_CLINIC_OR_DEPARTMENT_OTHER): Payer: Self-pay

## 2021-12-05 ENCOUNTER — Ambulatory Visit: Payer: Medicare Other | Admitting: Pharmacist

## 2021-12-05 DIAGNOSIS — R600 Localized edema: Secondary | ICD-10-CM

## 2021-12-05 DIAGNOSIS — I10 Essential (primary) hypertension: Secondary | ICD-10-CM

## 2021-12-05 MED ORDER — HYDROCHLOROTHIAZIDE 25 MG PO TABS: 25.0000 mg | ORAL_TABLET | Freq: Every day | ORAL | 3 refills | Status: DC

## 2021-12-05 MED ORDER — AMLODIPINE BESYLATE 5 MG PO TABS: ORAL_TABLET | ORAL | 1 refills | Status: DC

## 2021-12-05 MED ORDER — METFORMIN HCL ER 500 MG PO TB24: 500.0000 mg | ORAL_TABLET | Freq: Every day | ORAL | 1 refills | Status: DC

## 2021-12-05 NOTE — Progress Notes (Signed)
Pharmacy Note  12/05/2021 Name: Felicia Martin MRN: [id number] DOB: [birth year]  Subjective: Felicia Martin is a 88 y.o. year old female who is a primary care patient of Ann Held, DO. Clinical Pharmacist Practitioner is involved in medication and diabetes management. Patient was previously enrolled in Chronic Care Management services.   Engaged with patient by telephone for follow up visit in response to provider referral for pharmacy case management and/or care coordination services.   Diabetes: patient reports she is only taking metformin 547m once daily (dose was increased to twice a day at last OV - 10/2021). She reports that she tried to take twice a day but had nausea after night dose even when taking with a meal. Home blood glucose readings reported from patient 112 to 123 mostly. Highest was 139.  Hyperlipidemia: statin intolerant. Also unable to take ezetimibe. Declined other therapies to lower LDL. Refused PCSK9 due to being an injection.  Asthma: using albuterol and Flovent as needed. She reports has been about 2 weeks since she had to take either. Last RF for albuterol and Flovent was 06/05/2021.  Hypertension: Taking amlodipine and hydrochlorothiazide. Home blood pressure per patient has been 108 to 123 / 65 to 75. Denies dizziness or chest pain.  Health Maintenance: has refused annual flu vaccine in past.     Objective:  Lab Results  Component Value Date   CREATININE 0.87 10/12/2021   CREATININE 0.83 03/24/2021   CREATININE 0.81 09/20/2020    Lab Results  Component Value Date   HGBA1C 6.9 (H) 10/12/2021       Component Value Date/Time   CHOL 326 (H) 10/12/2021 1017   TRIG 133.0 10/12/2021 1017   HDL 58.70 10/12/2021 1017   CHOLHDL 6 10/12/2021 1017   VLDL 26.6 10/12/2021 1017   LDLCALC 240 (H) 10/12/2021 1017   LDLDIRECT 230.7 03/18/2013 0805    BP Readings from Last 3 Encounters:  10/12/21 132/80  04/21/21 (!) 137/54  03/24/21 130/82     Allergies  Allergen Reactions   Fenofibrate Rash   Statins Other (See Comments)    Muscle aches and weakness   Zetia [Ezetimibe] Other (See Comments)    Muscle pain   Farxiga [Dapagliflozin] Swelling    Patient didn't like increase in urinary frequency and reported swelling    Medications Reviewed Today     Reviewed by ECherre Robins RPH-CPP (Pharmacist) on 12/05/21 at 1016  Med List Status: <None>   Medication Order Taking? Sig Documenting Provider Last Dose Status Informant  albuterol (VENTOLIN HFA) 108 (90 Base) MCG/ACT inhaler [id number] Inhale 2 puffs into the lungs every 6 (six) hours as needed. LCarollee Herter YKendrick FriesR, DO Taking Active   amLODipine (NORVASC) 5 MG tablet [id number] TAKE 1 TABLET(5 MG) BY MOUTH DAILY LCarollee Herter YKendrick FriesR, DO  Active   Blood Glucose Monitoring Suppl (ONETOUCH VERIO) w/Device KIT [id number] Use as directed once a day.  Dx code: E11.9 LAnn Held DO Taking Active   fluticasone (FLOVENT HFA) 110 MCG/ACT inhaler [id number] Inhale 2 puffs into the lungs 2 (two) times daily.  Patient not taking: Reported on 12/05/2021   LAnn Held DO Not Taking Active            Med Note ([facility] TN[facility]olsSep 19, 2023 10:01 AM) Using as needed  glucose blood [facility]) test strip [id number] Use to check blood pressure once a day (Dx: type 2 DM  11.9) Ann Held, DO Taking Active   hydrochlorothiazide (HYDRODIURIL) 25 MG tablet [id number]  Take 1 tablet (25 mg total) by mouth daily. Carollee Herter, Alferd Apa, DO  Active   Lancets (ONETOUCH DELICA PLUS [id number]) [place] [id number] Yes USE ONCE A DAY Ann Held, DO Taking Active   Metamucil Fiber CHEW [id number] Yes Chew 1 each by mouth daily. [provider] Taking Active   metFORMIN (GLUCOPHAGE) 500 MG tablet [id number] Yes Take 1 tablet (500 mg total) by mouth 2 (two) times daily with a meal.  Patient taking differently: Take 500 mg by mouth daily with  breakfast.   Carollee Herter, Alferd Apa, DO Taking Active   naproxen sodium (ALEVE) 220 MG tablet [id number] Yes Take 220 mg by mouth daily as needed (back pain). [provider] Taking Active             Patient Active Problem List   Diagnosis Date Noted   Diabetic polyneuropathy associated with type 2 diabetes mellitus ([place]) 09/20/2020   Pincer nail deformity 08/17/2020   Statin intolerance 06/03/2020   DDD (degenerative disc disease), thoracolumbar 10/06/2019   Uncontrolled type 2 diabetes mellitus with hyperglycemia ([place]) 10/06/2019   Hyperlipidemia associated with type 2 diabetes mellitus ([place]) 10/06/2019   Cerumen impaction 09/21/2018   Impacted cerumen of both ears 09/16/2018   Lower extremity edema 07/24/2018   DM (diabetes mellitus) type II uncontrolled, periph vascular disorder 06/02/2018   Acute left-sided low back pain without sciatica 05/08/2018   Hyperlipidemia 01/29/2017   Colon cancer screening 10/21/2014   Dysuria 03/17/2014   Primary hypertension 03/05/2014   Routine general medical examination at a health care facility 12/07/2013   Elevated blood pressure 12/07/2013   Frequent urination at night 12/07/2013   Nonspecific abnormal electrocardiogram (ECG) (EKG) 12/07/2013   CANDIDIASIS, VULVAR 05/12/2010   UTI 05/04/2010   DEGENERATIVE JOINT DISEASE 09/22/2008   Dysphagia 09/22/2008   Disorder of bone and cartilage 11/05/2007   TELANGIECTASIA 05/29/2007   HYPERCHOLESTEROLEMIA 02/01/2007   PULMONARY FIBROSIS 02/01/2007   SARCOIDOSIS 11/20/2006   Asthma 11/20/2006    Medication Assistance:  None required.  Patient affirms current coverage meets needs.   Assessment / Plan:  Diabetes: Patient was unable to tolerate metformin twice daily. Home blood glucose reading have been at goals.  Recommend taking metformin ER 572m once daily since she was not able to tolerate metformin 5034mtwice daily. May retry twice daily in future if A1c > 7.0%.  Continue to  check blood glucose daily. Discussed limiting CHO in diet Hyperlipidemia: statin intolerant. Also unable to take ezetimibe. Declined other therapies to lower LDL. Refused PCSK9 due to being an injection. Continue to limit intake of high fat foots.  Asthma: controlled currently. Discussed maintenance versus resuce inhaler. Patient to try using Flovent twice daily. .  Hypertension: Currently controlled. Continue amlodipine and hydrochlorothiazide. Updated prescriptions sent to pharmacy. Continue to check blood pressure at home 2 to 3 times per week. Health Maintenance: Continues to refuse annual flu vaccine. She is interested in getting new COVID vaccine and possibly RSV vaccine when in office [place] Provided information to patient by mail for RSV adult vaccine.    TaCherre RobinsPharmD Clinical Pharmacist [facility]

## 2021-12-05 NOTE — Patient Instructions (Signed)
Asthma Current regimen:  Albuterol HFA 108 mcg 2 puffs every 6 hours as needed - rescue inhaler. To be used for wheezing and when you are having difficulty breathing Flovent 136mg - inhale 2 puffs twice a day - healing medication / maintenance inhaler. This inhaler helps to keep your lungs health and strong.    Health Maintenance:  Reviewed vaccination history and discussed benefits of Covid vaccination and RSV vaccine (see below for more information.  You can get these vaccine at your local pharmacy or [facility].   Respiratory Syncytial Virus (RSV) Immunizations Pronounced (sin-SISH-uhl or RSV)  Respiratory syncytial (sin-SISH-uhl) virus, or RSV, is a common respiratory virus that usually causes mild, cold-like symptoms. Most people recover in a week or two, but RSV can be serious. Infants and older adults are more likely to develop severe RSV and need hospitalization. Vaccines are available to protect older adults from severe RSV.   CDC Recommends:  Adults 86years old and over:  Adults 676years of age and older may receive a single dose of RSV vaccine using shared clinical decision-making.   Respiratory Syncytial Virus Infection, Adult Respiratory syncytial virus (RSV) infection is an infection caused by RSV, a common virus. This virus is similar to viruses that cause the common cold and the flu. RSV infection can affect the nose, throat, windpipe, and lungs (respiratory system). When the infection is severe, it can cause: Bronchiolitis. This condition causes inflammation of the air passages in the lungs (bronchioles). Pneumonia. This condition causes inflammation of the air sacs in the lungs. RSV infection spreads from person to person (is contagious) through droplets from coughs and sneezes (respiratory secretions). This condition is rarely serious when it occurs in adults. What are the causes? This condition is caused by contact with  RSV. This can happen by: Breathing respiratory secretions from someone who has the infection. Touching something that has been exposed to the virus (is contaminated) and then touching your mouth, nose, or eyes. Coming in close contact with someone who has this infection. This may happen if you: Hug or kiss. Shake or hold hands. Eat or drink using the same dishes or utensils. What increases the risk? The following factors may make you more likely to develop this condition: Being 670years of age or older. Having certain health conditions, including: A long-term (chronic) lung condition, such as chronic obstructive pulmonary disease (COPD). An immune system that is weak. This is your body's defense system. Down syndrome. Heart disease. Working in a hospital or other health care facility. Living in a long-term health care facility. RSV infections are most common from the months of November to April, but they can happen any time of year. What are the signs or symptoms? Symptoms of this condition include: Having a runny nose. Coughing. You may have a cough that brings up mucus (productive cough). Sneezing. Having a fever. Wanting to eat less than usual. Breathing loudly (wheezing). Having shortness of breath. Having fluid build up in the lungs (respiratory distress). How is this diagnosed? This condition may be diagnosed based on: Your symptoms. Your medical history. A physical exam. A chest X-ray to rule out pneumonia. Blood tests or tests of mucus from your lungs (sputum). These tests may be done for older adults. A test of a sample of your respiratory secretions. How is this treated? In most cases, the RSV infection will go away after 1-2 weeks of caring for yourself at home.  Sometimes, RSV infection is severe and can cause bronchiolitis or pneumonia. If you develop one or both of these conditions, you may need to be treated in the hospital. You may be given: Oxygen  therapy. Antiviral medicine. Medicines to open your bronchioles (bronchodilators).

## 2021-12-15 ENCOUNTER — Ambulatory Visit: Payer: Medicare Other | Admitting: Podiatry

## 2021-12-15 ENCOUNTER — Encounter: Payer: Self-pay | Admitting: Podiatry

## 2021-12-15 DIAGNOSIS — M79609 Pain in unspecified limb: Secondary | ICD-10-CM

## 2021-12-15 DIAGNOSIS — E1149 Type 2 diabetes mellitus with other diabetic neurological complication: Secondary | ICD-10-CM

## 2021-12-15 DIAGNOSIS — M79675 Pain in left toe(s): Secondary | ICD-10-CM

## 2021-12-15 DIAGNOSIS — M79674 Pain in right toe(s): Secondary | ICD-10-CM

## 2021-12-15 DIAGNOSIS — B351 Tinea unguium: Secondary | ICD-10-CM | POA: Diagnosis not present

## 2021-12-15 DIAGNOSIS — L608 Other nail disorders: Secondary | ICD-10-CM | POA: Diagnosis not present

## 2021-12-15 NOTE — Progress Notes (Signed)
This patient returns to my office for at risk foot care.  This patient requires this care by a professional since this patient will be at risk due to having type 2 diabetes.  This patient is unable to cut nails herself since the patient cannot reach her nails.These nails are painful walking and wearing shoes.  This patient presents for at risk foot care today. Patient states that her left big toenail is especially painful when sleeping.  General Appearance  Alert, conversant and in no acute stress.  Vascular  Dorsalis pedis pulses are palpable  bilaterally. Posterior tibial pulses are weakly palpable  B/L. Capillary return is within normal limits  bilaterally. Temperature is within normal limits  bilaterally.  Neurologic  Senn-Weinstein monofilament wire test diminished   bilaterally. Muscle power within normal limits bilaterally.  Nails Thick disfigured discolored nails with subungual debris  from hallux to fifth toes bilaterally. No evidence of bacterial infection or drainage bilaterally.  Pincer nails  B/Lm especially left hallux.  Orthopedic  No limitations of motion  feet .  No crepitus or effusions noted.  No bony pathology .  Hammer toes  B/L.  Skin  normotropic skin with no porokeratosis noted bilaterally.  No signs of infections or ulcers noted.     Onychomycosis  Pain in right toes  Pain in left toes  Consent was obtained for treatment procedures.   Mechanical debridement of nails 1-5  bilaterally performed with a nail nipper.  Filed with dremel without incident.    Return office visit    10 weeks                 Told patient to return for periodic foot care and evaluation due to potential at risk complications.   Gardiner Barefoot DPM

## 2022-01-04 ENCOUNTER — Encounter: Payer: Self-pay | Admitting: Family Medicine

## 2022-01-04 ENCOUNTER — Ambulatory Visit (INDEPENDENT_AMBULATORY_CARE_PROVIDER_SITE_OTHER): Payer: Medicare Other | Admitting: Family Medicine

## 2022-01-04 ENCOUNTER — Ambulatory Visit (HOSPITAL_BASED_OUTPATIENT_CLINIC_OR_DEPARTMENT_OTHER)
Admission: RE | Admit: 2022-01-04 | Discharge: 2022-01-04 | Disposition: A | Discharge status: Home / Self Care | Payer: Medicare Other | Source: Ambulatory Visit | Attending: Family Medicine | Admitting: Family Medicine

## 2022-01-04 VITALS — BP 138/80 | HR 76 | Temp 98.1°F | Resp 20 | Ht 60.0 in | Wt 210.0 lb

## 2022-01-04 DIAGNOSIS — R339 Retention of urine, unspecified: Secondary | ICD-10-CM

## 2022-01-04 DIAGNOSIS — E1165 Type 2 diabetes mellitus with hyperglycemia: Secondary | ICD-10-CM

## 2022-01-04 DIAGNOSIS — I10 Essential (primary) hypertension: Secondary | ICD-10-CM | POA: Diagnosis not present

## 2022-01-04 DIAGNOSIS — E1169 Type 2 diabetes mellitus with other specified complication: Secondary | ICD-10-CM | POA: Diagnosis not present

## 2022-01-04 DIAGNOSIS — E1140 Type 2 diabetes mellitus with diabetic neuropathy, unspecified: Secondary | ICD-10-CM | POA: Diagnosis not present

## 2022-01-04 DIAGNOSIS — M5441 Lumbago with sciatica, right side: Secondary | ICD-10-CM | POA: Diagnosis not present

## 2022-01-04 DIAGNOSIS — E785 Hyperlipidemia, unspecified: Secondary | ICD-10-CM | POA: Diagnosis not present

## 2022-01-04 DIAGNOSIS — Z780 Asymptomatic menopausal state: Secondary | ICD-10-CM | POA: Diagnosis not present

## 2022-01-04 DIAGNOSIS — M8589 Other specified disorders of bone density and structure, multiple sites: Secondary | ICD-10-CM | POA: Diagnosis not present

## 2022-01-04 LAB — COMPREHENSIVE METABOLIC PANEL
ALT: 8 U/L (ref 0–35)
AST: 14 U/L (ref 0–37)
Albumin: 4.2 g/dL (ref 3.5–5.2)
Alkaline Phosphatase: 55 U/L (ref 39–117)
BUN: 20 mg/dL (ref 6–23)
CO2: 26 mEq/L (ref 19–32)
Calcium: 9.5 mg/dL (ref 8.4–10.5)
Chloride: 104 mEq/L (ref 96–112)
Creatinine, Ser: 0.82 mg/dL (ref 0.40–1.20)
GFR: 64.53 mL/min (ref >60.00)
Glucose, Bld: 109 mg/dL — ABNORMAL HIGH (ref 70–99)
Potassium: 4.2 mEq/L (ref 3.5–5.1)
Sodium: 138 mEq/L (ref 135–145)
Total Bilirubin: 0.4 mg/dL (ref 0.2–1.2)
Total Protein: 7.7 g/dL (ref 6.0–8.3)

## 2022-01-04 LAB — CBC WITH DIFFERENTIAL/PLATELET
Basophils Absolute: 0 10*3/uL (ref 0.0–0.1)
Basophils Relative: 1 % (ref 0.0–3.0)
Eosinophils Absolute: 0.3 10*3/uL (ref 0.0–0.7)
Eosinophils Relative: 5.8 % — ABNORMAL HIGH (ref 0.0–5.0)
HCT: 38.7 % (ref 36.0–46.0)
Hemoglobin: 12.8 g/dL (ref 12.0–15.0)
Lymphocytes Relative: 32.8 % (ref 12.0–46.0)
Lymphs Abs: 1.6 10*3/uL (ref 0.7–4.0)
MCHC: 33 g/dL (ref 30.0–36.0)
MCV: 92.3 fl (ref 78.0–100.0)
Monocytes Absolute: 0.5 10*3/uL (ref 0.1–1.0)
Monocytes Relative: 10.5 % (ref 3.0–12.0)
Neutro Abs: 2.4 10*3/uL (ref 1.4–7.7)
Neutrophils Relative %: 49.9 % (ref 43.0–77.0)
Platelets: 307 10*3/uL (ref 150.0–400.0)
RBC: 4.19 Mil/uL (ref 3.87–5.11)
RDW: 13.1 % (ref 11.5–15.5)
WBC: 4.9 10*3/uL (ref 4.0–10.5)

## 2022-01-04 LAB — LIPID PANEL
Cholesterol: 286 mg/dL — ABNORMAL HIGH (ref 0–200)
HDL: 56.6 mg/dL (ref >39.00)
LDL Cholesterol: 206 mg/dL — ABNORMAL HIGH (ref 0–99)
NonHDL: 229.78
Total CHOL/HDL Ratio: 5
Triglycerides: 120 mg/dL (ref 0.0–149.0)
VLDL: 24 mg/dL (ref 0.0–40.0)

## 2022-01-04 LAB — HEMOGLOBIN A1C: Hgb A1c MFr Bld: 6.9 % — ABNORMAL HIGH (ref 4.6–6.5)

## 2022-01-04 MED ORDER — CELECOXIB 100 MG PO CAPS: 100.0000 mg | ORAL_CAPSULE | Freq: Two times a day (BID) | ORAL | 1 refills | Status: DC

## 2022-01-04 NOTE — Assessment & Plan Note (Signed)
Encourage heart healthy diet such as MIND or DASH diet, increase exercise, avoid trans fats, simple carbohydrates and processed foods, consider a krill or fish or flaxseed oil cap daily.  °

## 2022-01-04 NOTE — Patient Instructions (Signed)
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
## 2022-01-04 NOTE — Assessment & Plan Note (Signed)
hgba1c to be checked, minimize simple carbs. Increase exercise as tolerated. Continue current meds  

## 2022-01-04 NOTE — Assessment & Plan Note (Signed)
Well controlled, no changes to meds. Encouraged heart healthy diet such as the DASH diet and exercise as tolerated.  °

## 2022-01-05 LAB — URINE CULTURE
MICRO NUMBER:: 14073689
SPECIMEN QUALITY:: ADEQUATE

## 2022-01-09 ENCOUNTER — Other Ambulatory Visit: Payer: Self-pay | Admitting: *Deleted

## 2022-01-09 MED ORDER — ONETOUCH DELICA PLUS LANCET33G MISC: 1 refills | Status: DC

## 2022-01-11 ENCOUNTER — Telehealth: Payer: Self-pay | Admitting: Family Medicine

## 2022-01-11 ENCOUNTER — Other Ambulatory Visit: Payer: Self-pay | Admitting: Family Medicine

## 2022-01-11 DIAGNOSIS — E1169 Type 2 diabetes mellitus with other specified complication: Secondary | ICD-10-CM

## 2022-01-11 DIAGNOSIS — Z789 Other specified health status: Secondary | ICD-10-CM

## 2022-01-11 DIAGNOSIS — I10 Essential (primary) hypertension: Secondary | ICD-10-CM

## 2022-01-11 DIAGNOSIS — E1140 Type 2 diabetes mellitus with diabetic neuropathy, unspecified: Secondary | ICD-10-CM

## 2022-01-11 NOTE — Telephone Encounter (Signed)
Pt called to go over labs from last week.

## 2022-01-12 NOTE — Telephone Encounter (Signed)
Spoke with patient. Pt verbalized understanding  °

## 2022-03-06 ENCOUNTER — Telehealth: Payer: Medicare Other

## 2022-03-15 ENCOUNTER — Ambulatory Visit (HOSPITAL_COMMUNITY)
Admission: EM | Admit: 2022-03-15 | Discharge: 2022-03-15 | Disposition: A | Discharge status: Home / Self Care | Payer: Medicare Other | Attending: Internal Medicine | Admitting: Internal Medicine

## 2022-03-15 ENCOUNTER — Encounter (HOSPITAL_COMMUNITY): Payer: Self-pay | Admitting: *Deleted

## 2022-03-15 DIAGNOSIS — H6123 Impacted cerumen, bilateral: Secondary | ICD-10-CM | POA: Diagnosis not present

## 2022-03-15 NOTE — ED Triage Notes (Signed)
Pt states she is here for bilateral ear wash she has been using OTC ear wax softner so she hopes it helps. Her ears have been clogged x 1 week.

## 2022-03-15 NOTE — Discharge Instructions (Signed)
Please continue using Debrox as needed Tylenol/Motrin as needed for pain and/or fever Return to urgent care if symptoms worsen.

## 2022-03-15 NOTE — ED Provider Notes (Signed)
[facility]    CSN: [id number] Arrival date & time: 03/15/22  1359      History   Chief Complaint Chief Complaint  Patient presents with   Ear Fullness    HPI Felicia Martin is a 88 y.o. female comes to the urgent care for ear irrigation.  Patient endorses decreased hearing over the past week.  She attributed that to earwax buildup.  She has been trying over-the-counter medication with no improvement in her symptoms.  No ringing in the ears.  No nausea or vomiting.  No fever or chills.  No dizziness.   HPI  Past Medical History:  Diagnosis Date   Asthma    GERD (gastroesophageal reflux disease)    Hypercholesterolemia    Hypertension    Pulmonary fibrosis ([place])    Sarcoidosis    Surgical history of tubal ligation 1960s    Patient Active Problem List   Diagnosis Date Noted   Diabetic polyneuropathy associated with type 2 diabetes mellitus ([place]) 09/20/2020   Pincer nail deformity 08/17/2020   Statin intolerance 06/03/2020   DDD (degenerative disc disease), thoracolumbar 10/06/2019   Uncontrolled type 2 diabetes mellitus with hyperglycemia ([place]) 10/06/2019   Hyperlipidemia associated with type 2 diabetes mellitus ([place]) 10/06/2019   Cerumen impaction 09/21/2018   Impacted cerumen of both ears 09/16/2018   Lower extremity edema 07/24/2018   DM (diabetes mellitus) type II uncontrolled, periph vascular disorder 06/02/2018   Acute left-sided low back pain without sciatica 05/08/2018   Hyperlipidemia 01/29/2017   Colon cancer screening 10/21/2014   Dysuria 03/17/2014   Primary hypertension 03/05/2014   Routine general medical examination at a health care facility 12/07/2013   Elevated blood pressure 12/07/2013   Frequent urination at night 12/07/2013   Nonspecific abnormal electrocardiogram (ECG) (EKG) 12/07/2013   CANDIDIASIS, VULVAR 05/12/2010   UTI 05/04/2010   DEGENERATIVE JOINT DISEASE 09/22/2008   Dysphagia 09/22/2008   Disorder of bone and  cartilage 11/05/2007   TELANGIECTASIA 05/29/2007   HYPERCHOLESTEROLEMIA 02/01/2007   PULMONARY FIBROSIS 02/01/2007   SARCOIDOSIS 11/20/2006   Asthma 11/20/2006    Past Surgical History:  Procedure Laterality Date   COLONOSCOPY  12/09/14   ESOPHAGOGASTRODUODENOSCOPY  12/09/14   LUNG SURGERY  1960   Right lung   Maloney dilation  12/09/14   TUBAL LIGATION  1960    OB History   No obstetric history on file.      Home Medications    Prior to Admission medications   Medication Sig Start Date End Date Taking? Authorizing Provider  albuterol (VENTOLIN HFA) 108 (90 Base) MCG/ACT inhaler Inhale 2 puffs into the lungs every 6 (six) hours as needed. 03/24/21  Yes Roma Schanz R, DO  amLODipine (NORVASC) 5 MG tablet TAKE 1 TABLET(5 MG) BY MOUTH DAILY 12/05/21  Yes Roma Schanz R, DO  Blood Glucose Monitoring Suppl (ONETOUCH VERIO) w/Device KIT Use as directed once a day.  Dx code: E11.9 06/10/18  Yes Ann Held, DO  celecoxib (CELEBREX) 100 MG capsule Take 1 capsule (100 mg total) by mouth 2 (two) times daily. 01/04/22  Yes Roma Schanz R, DO  fluticasone (FLOVENT HFA) 110 MCG/ACT inhaler Inhale 2 puffs into the lungs 2 (two) times daily. 03/24/21  Yes Roma Schanz R, DO  glucose blood (ONETOUCH VERIO) test strip Use to check blood pressure once a day (Dx: type 2 DM 11.9) 05/31/21  Yes Lowne Lyndal Pulley R, DO  hydrochlorothiazide (HYDRODIURIL) 25 MG tablet Take 1  tablet (25 mg total) by mouth daily. 12/05/21  Yes Carollee Herter, Kendrick Fries R, DO  Lancets (ONETOUCH DELICA PLUS [id number]) MISC USE ONCE A DAY.  DX CODE: E11.9 01/09/22  Yes Roma Schanz R, DO  Metamucil Fiber CHEW Chew 1 each by mouth daily.   Yes [provider]  metFORMIN (GLUCOPHAGE-XR) 500 MG 24 hr tablet Take 1 tablet (500 mg total) by mouth daily with breakfast. 12/05/21  Yes Roma Schanz R, DO  naproxen sodium (ALEVE) 220 MG tablet Take 220 mg by mouth daily as needed (back  pain).   Yes [provider]    Family History Family History  Problem Relation Age of Onset   Diabetes Sister        x's 3   Alzheimer's disease Sister    Liver cancer Mother    Liver cancer Father    Cancer Father    Cancer Brother    Diabetes Brother    Hypertension Sister        x's 3   Hyperlipidemia Other    Breast cancer Neg Hx     Social History Social History   Tobacco Use   Smoking status: Former    Packs/day: 0.50    Years: 40.00    Total pack years: 20.00    Types: Cigarettes    Quit date: 05/25/1999    Years since quitting: 22.8   Smokeless tobacco: Never  Substance Use Topics   Alcohol use: No   Drug use: No     Allergies   Fenofibrate, Statins, Zetia [ezetimibe], and Farxiga [dapagliflozin]   Review of Systems Review of Systems As per HPI  Physical Exam Triage Vital Signs ED Triage Vitals  Enc Vitals Group     BP 03/15/22 1726 (!) 158/89     Pulse Rate 03/15/22 1726 94     Resp 03/15/22 1726 18     Temp 03/15/22 1726 98.8 F (37.1 C)     Temp Source 03/15/22 1726 Oral     SpO2 03/15/22 1726 94 %     Weight --      Height --      Head Circumference --      Peak Flow --      Pain Score 03/15/22 1725 0     Pain Loc --      Pain Edu? --      Excl. in [place]? --    No data found.  Updated Vital Signs BP (!) 158/89 (BP Location: Right Arm)   Pulse 94   Temp 98.8 F (37.1 C) (Oral)   Resp 18   LMP 03/17/2014   SpO2 94%   Visual Acuity Right Eye Distance:   Left Eye Distance:   Bilateral Distance:    Right Eye Near:   Left Eye Near:    Bilateral Near:     Physical Exam Vitals and nursing note reviewed.  HENT:     Ears:     Comments: Cerumen impaction in both the ears    Nose: No rhinorrhea.     Mouth/Throat:     Mouth: Mucous membranes are moist.     Pharynx: No posterior oropharyngeal erythema.  Cardiovascular:     Rate and Rhythm: Normal rate and regular rhythm.      UC Treatments / Results  Labs (all  labs ordered are listed, but only abnormal results are displayed) Labs Reviewed - No data to display  EKG   Radiology No results found.  Procedures  Procedures (including critical care time)  Medications Ordered in UC Medications - No data to display  Initial Impression / Assessment and Plan / UC Course  I have reviewed the triage vital signs and the nursing notes.  Pertinent labs & imaging results that were available during my care of the patient were reviewed by me and considered in my medical decision making (see chart for details).     1.  Bilateral cerumen impaction: Ear irrigation completed Tympanic membrane is visualized-tympanic membrane is without erythema.  No middle ear effusion Tylenol/Motrin as needed for pain Return precautions given Final Clinical Impressions(s) / UC Diagnoses   Final diagnoses:  Bilateral hearing loss due to cerumen impaction     Discharge Instructions      Please continue using Debrox as needed Tylenol/Motrin as needed for pain and/or fever Return to urgent care if symptoms worsen.     ED Prescriptions   None    PDMP not reviewed this encounter.   Chase Picket, MD 03/15/22 [phone]

## 2022-03-21 ENCOUNTER — Ambulatory Visit: Payer: Medicare Other | Admitting: Podiatry

## 2022-03-22 ENCOUNTER — Encounter (HOSPITAL_BASED_OUTPATIENT_CLINIC_OR_DEPARTMENT_OTHER): Payer: Self-pay

## 2022-03-22 ENCOUNTER — Emergency Department (HOSPITAL_BASED_OUTPATIENT_CLINIC_OR_DEPARTMENT_OTHER): Payer: Medicare Other

## 2022-03-22 ENCOUNTER — Emergency Department (HOSPITAL_BASED_OUTPATIENT_CLINIC_OR_DEPARTMENT_OTHER)
Admission: EM | Admit: 2022-03-22 | Discharge: 2022-03-22 | Disposition: A | Discharge status: Home / Self Care | Payer: Medicare Other | Attending: Emergency Medicine | Admitting: Emergency Medicine

## 2022-03-22 ENCOUNTER — Other Ambulatory Visit: Payer: Self-pay

## 2022-03-22 DIAGNOSIS — K219 Gastro-esophageal reflux disease without esophagitis: Secondary | ICD-10-CM | POA: Insufficient documentation

## 2022-03-22 DIAGNOSIS — Z79899 Other long term (current) drug therapy: Secondary | ICD-10-CM | POA: Diagnosis not present

## 2022-03-22 DIAGNOSIS — R109 Unspecified abdominal pain: Secondary | ICD-10-CM | POA: Diagnosis not present

## 2022-03-22 DIAGNOSIS — R7309 Other abnormal glucose: Secondary | ICD-10-CM | POA: Diagnosis not present

## 2022-03-22 DIAGNOSIS — E871 Hypo-osmolality and hyponatremia: Secondary | ICD-10-CM | POA: Insufficient documentation

## 2022-03-22 DIAGNOSIS — I10 Essential (primary) hypertension: Secondary | ICD-10-CM | POA: Insufficient documentation

## 2022-03-22 DIAGNOSIS — E878 Other disorders of electrolyte and fluid balance, not elsewhere classified: Secondary | ICD-10-CM | POA: Insufficient documentation

## 2022-03-22 DIAGNOSIS — R1031 Right lower quadrant pain: Secondary | ICD-10-CM | POA: Diagnosis not present

## 2022-03-22 DIAGNOSIS — I77810 Thoracic aortic ectasia: Secondary | ICD-10-CM | POA: Diagnosis not present

## 2022-03-22 DIAGNOSIS — I700 Atherosclerosis of aorta: Secondary | ICD-10-CM | POA: Diagnosis not present

## 2022-03-22 DIAGNOSIS — M545 Low back pain, unspecified: Secondary | ICD-10-CM

## 2022-03-22 LAB — URINALYSIS, ROUTINE W REFLEX MICROSCOPIC
Bilirubin Urine: NEGATIVE
Glucose, UA: NEGATIVE mg/dL
Hgb urine dipstick: NEGATIVE
Ketones, ur: NEGATIVE mg/dL
Leukocytes,Ua: NEGATIVE
Nitrite: NEGATIVE
Protein, ur: NEGATIVE mg/dL
Specific Gravity, Urine: 1.01 (ref 1.005–1.030)
pH: 6 (ref 5.0–8.0)

## 2022-03-22 LAB — COMPREHENSIVE METABOLIC PANEL
ALT: 13 U/L (ref 0–44)
AST: 23 U/L (ref 15–41)
Albumin: 4.1 g/dL (ref 3.5–5.0)
Alkaline Phosphatase: 59 U/L (ref 38–126)
Anion gap: 11 (ref 5–15)
BUN: 25 mg/dL — ABNORMAL HIGH (ref 8–23)
CO2: 24 mmol/L (ref 22–32)
Calcium: 9.7 mg/dL (ref 8.9–10.3)
Chloride: 97 mmol/L — ABNORMAL LOW (ref 98–111)
Creatinine, Ser: 1.17 mg/dL — ABNORMAL HIGH (ref 0.44–1.00)
GFR, Estimated: 45 mL/min — ABNORMAL LOW (ref >60)
Glucose, Bld: 124 mg/dL — ABNORMAL HIGH (ref 70–99)
Potassium: 3.7 mmol/L (ref 3.5–5.1)
Sodium: 132 mmol/L — ABNORMAL LOW (ref 135–145)
Total Bilirubin: 0.6 mg/dL (ref 0.3–1.2)
Total Protein: 9.2 g/dL — ABNORMAL HIGH (ref 6.5–8.1)

## 2022-03-22 LAB — TROPONIN I (HIGH SENSITIVITY)
Troponin I (High Sensitivity): 3 ng/L (ref <18)
Troponin I (High Sensitivity): 3 ng/L (ref <18)

## 2022-03-22 LAB — CBC WITH DIFFERENTIAL/PLATELET
Abs Immature Granulocytes: 0.01 10*3/uL (ref 0.00–0.07)
Basophils Absolute: 0.1 10*3/uL (ref 0.0–0.1)
Basophils Relative: 1 %
Eosinophils Absolute: 0.3 10*3/uL (ref 0.0–0.5)
Eosinophils Relative: 5 %
HCT: 39 % (ref 36.0–46.0)
Hemoglobin: 12.9 g/dL (ref 12.0–15.0)
Immature Granulocytes: 0 %
Lymphocytes Relative: 26 %
Lymphs Abs: 1.5 10*3/uL (ref 0.7–4.0)
MCH: 30.1 pg (ref 26.0–34.0)
MCHC: 33.1 g/dL (ref 30.0–36.0)
MCV: 91.1 fL (ref 80.0–100.0)
Monocytes Absolute: 0.6 10*3/uL (ref 0.1–1.0)
Monocytes Relative: 10 %
Neutro Abs: 3.3 10*3/uL (ref 1.7–7.7)
Neutrophils Relative %: 58 %
Platelets: 332 10*3/uL (ref 150–400)
RBC: 4.28 MIL/uL (ref 3.87–5.11)
RDW: 12.2 % (ref 11.5–15.5)
WBC: 5.7 10*3/uL (ref 4.0–10.5)
nRBC: 0 % (ref 0.0–0.2)

## 2022-03-22 MED ORDER — CYCLOBENZAPRINE HCL 10 MG PO TABS: 10.0000 mg | ORAL_TABLET | Freq: Two times a day (BID) | ORAL | 0 refills | Status: DC | PRN

## 2022-03-22 MED ORDER — LIDOCAINE 5 % EX PTCH: 1.0000 | MEDICATED_PATCH | CUTANEOUS | 0 refills | Status: DC

## 2022-03-22 MED ORDER — IOHEXOL 350 MG/ML SOLN
100.0000 mL | Freq: Once | INTRAVENOUS | Status: AC | PRN
  Administered 2022-03-22: 80 mL via INTRAVENOUS

## 2022-03-22 NOTE — ED Triage Notes (Addendum)
Pt arrived POV. Reports mid right back pain that radiates to abdomen. Pain started yesterday . Pain intermittent and varies in intensity

## 2022-03-22 NOTE — ED Provider Notes (Signed)
[facility] EMERGENCY DEPARTMENT Provider Note   CSN: [id number] Arrival date & time: 03/22/22  8119     History Chief Complaint  Patient presents with   Back Pain    Felicia Martin is a 89 y.o. female patient with history of GERD, hyperlipidemia, hypertension who presents to the emergency department today for further evaluation of lower back pain on the right side which radiates into the right lower quadrant.  Pain started yesterday.  It has been intermittent since onset and characterized as a "catching up" sensation.  She does report some urinary urgency and frequency but this has been ongoing before the pain started.  She denies any nausea, vomiting, diarrhea.   Back Pain      Home Medications Prior to Admission medications   Medication Sig Start Date End Date Taking? Authorizing Provider  cyclobenzaprine (FLEXERIL) 10 MG tablet Take 1 tablet (10 mg total) by mouth 2 (two) times daily as needed for muscle spasms. 03/22/22  Yes Terricka Onofrio M, PA-C  lidocaine (LIDODERM) 5 % Place 1 patch onto the skin daily. Remove & Discard patch within 12 hours or as directed by MD 03/22/22  Yes Raul Del, Minsa Weddington M, PA-C  albuterol (VENTOLIN HFA) 108 (90 Base) MCG/ACT inhaler Inhale 2 puffs into the lungs every 6 (six) hours as needed. 03/24/21   Roma Schanz R, DO  amLODipine (NORVASC) 5 MG tablet TAKE 1 TABLET(5 MG) BY MOUTH DAILY 12/05/21   Carollee Herter, Alferd Apa, DO  Blood Glucose Monitoring Suppl (ONETOUCH VERIO) w/Device KIT Use as directed once a day.  Dx code: E11.9 06/10/18   Carollee Herter, Alferd Apa, DO  celecoxib (CELEBREX) 100 MG capsule Take 1 capsule (100 mg total) by mouth 2 (two) times daily. 01/04/22   Roma Schanz R, DO  fluticasone (FLOVENT HFA) 110 MCG/ACT inhaler Inhale 2 puffs into the lungs 2 (two) times daily. 03/24/21   Ann Held, DO  glucose blood (ONETOUCH VERIO) test strip Use to check blood pressure once a day (Dx: type 2 DM 11.9) 05/31/21    Lowne Chase, Alferd Apa, DO  hydrochlorothiazide (HYDRODIURIL) 25 MG tablet Take 1 tablet (25 mg total) by mouth daily. 12/05/21   Carollee Herter, Kendrick Fries R, DO  Lancets (ONETOUCH DELICA PLUS [id number]) MISC USE ONCE A DAY.  DX CODE: E11.9 01/09/22   Roma Schanz R, DO  Metamucil Fiber CHEW Chew 1 each by mouth daily.    [provider]  metFORMIN (GLUCOPHAGE-XR) 500 MG 24 hr tablet Take 1 tablet (500 mg total) by mouth daily with breakfast. 12/05/21   Carollee Herter, Alferd Apa, DO  naproxen sodium (ALEVE) 220 MG tablet Take 220 mg by mouth daily as needed (back pain).    [provider]      Allergies    Fenofibrate, Statins, Zetia [ezetimibe], and Farxiga [dapagliflozin]    Review of Systems   Review of Systems  Musculoskeletal:  Positive for back pain.  All other systems reviewed and are negative.   Physical Exam Updated Vital Signs BP (!) 176/89   Pulse 79   Temp (!) 97.4 F (36.3 C) (Oral)   Resp 18   Ht 5' (1.524 m)   Wt 96.2 kg   LMP 03/17/2014   SpO2 97%   BMI 41.40 kg/m  Physical Exam Vitals and nursing note reviewed.  Constitutional:      Appearance: Normal appearance.  HENT:     Head: Normocephalic and atraumatic.  Eyes:  General:        Right eye: No discharge.        Left eye: No discharge.     Conjunctiva/sclera: Conjunctivae normal.  Pulmonary:     Effort: Pulmonary effort is normal.  Abdominal:     Tenderness: There is no abdominal tenderness. There is right CVA tenderness. There is no left CVA tenderness, guarding or rebound.  Musculoskeletal:     Comments: There is some right paralumbar tenderness around the costovertebral angle.  Skin:    General: Skin is warm and dry.     Findings: No rash.  Neurological:     General: No focal deficit present.     Mental Status: She is alert.  Psychiatric:        Mood and Affect: Mood normal.        Behavior: Behavior normal.     ED Results / Procedures / Treatments   Labs (all labs  ordered are listed, but only abnormal results are displayed) Labs Reviewed  URINALYSIS, ROUTINE W REFLEX MICROSCOPIC - Abnormal; Notable for the following components:      Result Value   Color, Urine STRAW (*)    All other components within normal limits  COMPREHENSIVE METABOLIC PANEL - Abnormal; Notable for the following components:   Sodium 132 (*)    Chloride 97 (*)    Glucose, Bld 124 (*)    BUN 25 (*)    Creatinine, Ser 1.17 (*)    Total Protein 9.2 (*)    GFR, Estimated 45 (*)    All other components within normal limits  CBC WITH DIFFERENTIAL/PLATELET  TROPONIN I (HIGH SENSITIVITY)  TROPONIN I (HIGH SENSITIVITY)    EKG EKG Interpretation  Date/Time:  Thursday March 22 2022 09:03:07 EST Ventricular Rate:  94 PR Interval:  161 QRS Duration: 76 QT Interval:  315 QTC Calculation: 394 R Axis:   74 Text Interpretation: Sinus rhythm Borderline repolarization abnormality No previous ECGs available Confirmed by Gareth Morgan [phone]) on 03/22/2022 10:02:43 AM  Radiology CT Angio Chest/Abd/Pel for Dissection W and/or Wo Contrast  Result Date: 03/22/2022 CLINICAL DATA:  Acute back pain. EXAM: CT ANGIOGRAPHY CHEST, ABDOMEN AND PELVIS TECHNIQUE: Non-contrast CT of the chest was initially obtained. Multidetector CT imaging through the chest, abdomen and pelvis was performed using the standard protocol during bolus administration of intravenous contrast. Multiplanar reconstructed images and MIPs were obtained and reviewed to evaluate the vascular anatomy. RADIATION DOSE REDUCTION: This exam was performed according to the departmental dose-optimization program which includes automated exposure control, adjustment of the mA and/or kV according to patient size and/or use of iterative reconstruction technique. CONTRAST:  86m OMNIPAQUE IOHEXOL 350 MG/ML SOLN COMPARISON:  November 21, 2007. FINDINGS: CTA CHEST FINDINGS Cardiovascular: Atherosclerosis of thoracic aorta is noted without aneurysm  or dissection. Normal cardiac size. No pericardial effusion. Enlarged pulmonary artery is noted suggesting pulmonary artery hypertension. Mediastinum/Nodes: No enlarged mediastinal, hilar, or axillary lymph nodes. Moderate size sliding-type hiatal hernia. Thyroid gland is unremarkable. Lungs/Pleura: No pneumothorax or pleural effusion is noted. Grossly stable fibrotic changes are noted in the right upper lobe. No acute pulmonary disease is noted. Musculoskeletal: No chest wall abnormality. No acute or significant osseous findings. Review of the MIP images confirms the above findings. CTA ABDOMEN AND PELVIS FINDINGS VASCULAR Aorta: Atherosclerosis of abdominal aorta is noted without aneurysm or dissection. Celiac: Patent without evidence of aneurysm, dissection, vasculitis or significant stenosis. SMA: Patent without evidence of aneurysm, dissection, vasculitis or significant stenosis. Renals: Both renal  arteries are patent without evidence of aneurysm, dissection, vasculitis, fibromuscular dysplasia or significant stenosis. IMA: Patent without evidence of aneurysm, dissection, vasculitis or significant stenosis. Inflow: Patent without evidence of aneurysm, dissection, vasculitis or significant stenosis. Veins: No obvious venous abnormality within the limitations of this arterial phase study. Review of the MIP images confirms the above findings. NON-VASCULAR Hepatobiliary: No focal liver abnormality is seen. No gallstones, gallbladder wall thickening, or biliary dilatation. Pancreas: Unremarkable. No pancreatic ductal dilatation or surrounding inflammatory changes. Spleen: Normal in size without focal abnormality. Adrenals/Urinary Tract: Adrenal glands are unremarkable. Kidneys are normal, without renal calculi, focal lesion, or hydronephrosis. Bladder is unremarkable. Stomach/Bowel: There is no evidence of bowel obstruction or inflammation. The appendix is unremarkable. Lymphatic: No adenopathy is noted.  Reproductive: Calcified uterine fibroid is noted. No adnexal abnormality is noted. Other: No abdominal wall hernia or abnormality. No abdominopelvic ascites. Musculoskeletal: No acute or significant osseous findings. Review of the MIP images confirms the above findings. IMPRESSION: No evidence of thoracic or abdominal aortic dissection or aneurysm. Stable fibrotic changes are noted in right upper lobe. Moderate size sliding-type hiatal hernia. Enlarged pulmonary artery suggesting pulmonary artery hypertension. Calcified uterine fibroid. Aortic Atherosclerosis (ICD10-I70.0). Electronically Signed   By: Marijo Conception M.D.   On: 03/22/2022 11:55   CT Renal Stone Study  Result Date: 03/22/2022 CLINICAL DATA:  Acute right flank pain. EXAM: CT ABDOMEN AND PELVIS WITHOUT CONTRAST TECHNIQUE: Multidetector CT imaging of the abdomen and pelvis was performed following the standard protocol without IV contrast. RADIATION DOSE REDUCTION: This exam was performed according to the departmental dose-optimization program which includes automated exposure control, adjustment of the mA and/or kV according to patient size and/or use of iterative reconstruction technique. COMPARISON:  None Available. FINDINGS: Lower chest: No acute abnormality. Hepatobiliary: No focal liver abnormality is seen. No gallstones, gallbladder wall thickening, or biliary dilatation. Pancreas: Unremarkable. No pancreatic ductal dilatation or surrounding inflammatory changes. Spleen: Normal in size without focal abnormality. Adrenals/Urinary Tract: Adrenal glands are unremarkable. Kidneys are normal, without renal calculi, focal lesion, or hydronephrosis. Bladder is unremarkable. Stomach/Bowel: Stomach is within normal limits. Appendix appears normal. No evidence of bowel wall thickening, distention, or inflammatory changes. Vascular/Lymphatic: Aortic atherosclerosis. No enlarged abdominal or pelvic lymph nodes. Reproductive: Calcified uterine fibroid is  noted. No adnexal abnormality is noted. Other: No abdominal wall hernia or abnormality. No abdominopelvic ascites. Musculoskeletal: No acute or significant osseous findings. IMPRESSION: Calcified uterine fibroid. No acute abnormality seen in the abdomen or pelvis. Aortic Atherosclerosis (ICD10-I70.0). Electronically Signed   By: Marijo Conception M.D.   On: 03/22/2022 10:12    Procedures Procedures   Medications Ordered in ED Medications  iohexol (OMNIPAQUE) 350 MG/ML injection 100 mL (80 mLs Intravenous Contrast Given 03/22/22 1129)    ED Course/ Medical Decision Making/ A&P Clinical Course as of 03/22/22 1337  Thu Mar 22, 2022  1331 CBC with Differential Normal. [CF]  1331 Comprehensive metabolic panel(!) There is mild hyponatremia, hypochloremia and elevated glucose.  Creatinine is slightly above normal for the patient. [CF]  1331 Troponin I (High Sensitivity) Show and delta troponin are normal. [CF]  1331 Urinalysis, Routine w reflex microscopic Urine, Clean Catch(!) Normal. [CF]  1331 CT Renal Stone Study I personally ordered and interpreted the study and do not see any evidence of kidney stone.  I do agree with the radiologist interpretation. [CF]  1332 CT Angio Chest/Abd/Pel for Dissection W and/or Wo Contrast I reviewed the study and do not see any evidence  of dissection today.  I do agree with the radiologist interpretation. [CF]    Clinical Course User Index [CF] Hendricks Limes, PA-C                           Medical Decision Making SUKANYA GOLDBLATT is a 89 y.o. female patient who presents to the emergency department today for further evaluation of right paralumbar muscular/flank pain.  Patient does endorse a remote history of kidney stone back in the 80s and she states that this feels somewhat similar.  I will plan to get some basic labs, urinalysis, and a CT renal stone study given the patient's age and pain distribution.  I offered the patient pain medication which she  currently declines at this time.  I will reassess once some of the imaging and labs result.  She is in no acute distress at this time.  On reevaluation, patient is about the same.  She currently is pain-free.  Given that there was no evidence of dissection or kidney stone or, I will likely treat her for muscular pain with muscle relaxers and lidocaine patches.  I notified patient and family at bedside of all lab results.  All questions or concerns addressed.  She is safe for discharge at this time.  Amount and/or Complexity of Data Reviewed Labs: ordered. Decision-making details documented in ED Course. Radiology: ordered. Decision-making details documented in ED Course.  Risk Prescription drug management.    Final Clinical Impression(s) / ED Diagnoses Final diagnoses:  Acute right-sided low back pain without sciatica    Rx / DC Orders ED Discharge Orders          Ordered    lidocaine (LIDODERM) 5 %  Every 24 hours        03/22/22 1336    cyclobenzaprine (FLEXERIL) 10 MG tablet  2 times daily PRN        03/22/22 1336              Myna Bright [place], [place] 03/22/22 1338    Gareth Morgan, MD 03/23/22 1121

## 2022-03-22 NOTE — Discharge Instructions (Addendum)
I think this is likely muscular in nature.  All of your labs and imaging were negative today.  I would like for you to follow-up with your primary care doctor for further evaluation.  I have also given you 2 prescriptions today.  1 is a muscle laxer and the other is a patchy complaints over the area of pain which can help provide some pain relief.  I would like for you to return to the emergency room for any worsening symptoms you might have.

## 2022-03-22 NOTE — ED Notes (Addendum)
Discharge paperwork reviewed entirely with patient, including Rx's and follow up care. Pain was under control. Pt verbalized understanding as well as all parties involved. No questions or concerns voiced at the time of discharge. No acute distress noted.   Pt was rolled out to the PVA in a wheelchair without incident.

## 2022-03-26 ENCOUNTER — Telehealth: Payer: Self-pay

## 2022-03-26 ENCOUNTER — Encounter: Payer: Self-pay | Admitting: Family Medicine

## 2022-03-26 ENCOUNTER — Ambulatory Visit (INDEPENDENT_AMBULATORY_CARE_PROVIDER_SITE_OTHER): Payer: Medicare Other | Admitting: Family Medicine

## 2022-03-26 VITALS — BP 130/86 | HR 110 | Temp 98.1°F | Resp 18 | Ht 60.0 in | Wt 197.6 lb

## 2022-03-26 DIAGNOSIS — E1169 Type 2 diabetes mellitus with other specified complication: Secondary | ICD-10-CM

## 2022-03-26 DIAGNOSIS — E1140 Type 2 diabetes mellitus with diabetic neuropathy, unspecified: Secondary | ICD-10-CM

## 2022-03-26 DIAGNOSIS — E1165 Type 2 diabetes mellitus with hyperglycemia: Secondary | ICD-10-CM | POA: Diagnosis not present

## 2022-03-26 DIAGNOSIS — I10 Essential (primary) hypertension: Secondary | ICD-10-CM

## 2022-03-26 DIAGNOSIS — Z789 Other specified health status: Secondary | ICD-10-CM

## 2022-03-26 DIAGNOSIS — I270 Primary pulmonary hypertension: Secondary | ICD-10-CM

## 2022-03-26 DIAGNOSIS — J45909 Unspecified asthma, uncomplicated: Secondary | ICD-10-CM

## 2022-03-26 DIAGNOSIS — E785 Hyperlipidemia, unspecified: Secondary | ICD-10-CM | POA: Diagnosis not present

## 2022-03-26 DIAGNOSIS — H6123 Impacted cerumen, bilateral: Secondary | ICD-10-CM

## 2022-03-26 DIAGNOSIS — J4520 Mild intermittent asthma, uncomplicated: Secondary | ICD-10-CM

## 2022-03-26 DIAGNOSIS — M5441 Lumbago with sciatica, right side: Secondary | ICD-10-CM | POA: Insufficient documentation

## 2022-03-26 LAB — COMPREHENSIVE METABOLIC PANEL
ALT: 8 U/L (ref 0–35)
AST: 15 U/L (ref 0–37)
Albumin: 4.4 g/dL (ref 3.5–5.2)
Alkaline Phosphatase: 56 U/L (ref 39–117)
BUN: 33 mg/dL — ABNORMAL HIGH (ref 6–23)
CO2: 27 mEq/L (ref 19–32)
Calcium: 10.8 mg/dL — ABNORMAL HIGH (ref 8.4–10.5)
Chloride: 98 mEq/L (ref 96–112)
Creatinine, Ser: 1.06 mg/dL (ref 0.40–1.20)
GFR: 47.35 mL/min — ABNORMAL LOW (ref >60.00)
Glucose, Bld: 123 mg/dL — ABNORMAL HIGH (ref 70–99)
Potassium: 3.9 mEq/L (ref 3.5–5.1)
Sodium: 136 mEq/L (ref 135–145)
Total Bilirubin: 0.6 mg/dL (ref 0.2–1.2)
Total Protein: 8.6 g/dL — ABNORMAL HIGH (ref 6.0–8.3)

## 2022-03-26 LAB — LIPID PANEL
Cholesterol: 289 mg/dL — ABNORMAL HIGH (ref 0–200)
HDL: 51.9 mg/dL (ref >39.00)
LDL Cholesterol: 211 mg/dL — ABNORMAL HIGH (ref 0–99)
NonHDL: 236.68
Total CHOL/HDL Ratio: 6
Triglycerides: 127 mg/dL (ref 0.0–149.0)
VLDL: 25.4 mg/dL (ref 0.0–40.0)

## 2022-03-26 LAB — HEMOGLOBIN A1C: Hgb A1c MFr Bld: 7 % — ABNORMAL HIGH (ref 4.6–6.5)

## 2022-03-26 MED ORDER — FLUTICASONE PROPIONATE HFA 110 MCG/ACT IN AERO: 2.0000 | INHALATION_SPRAY | Freq: Two times a day (BID) | RESPIRATORY_TRACT | 12 refills | Status: DC

## 2022-03-26 MED ORDER — LIDOCAINE 5 % EX PTCH: 1.0000 | MEDICATED_PATCH | CUTANEOUS | 0 refills | Status: DC

## 2022-03-26 MED ORDER — ALBUTEROL SULFATE HFA 108 (90 BASE) MCG/ACT IN AERS: 2.0000 | INHALATION_SPRAY | Freq: Four times a day (QID) | RESPIRATORY_TRACT | 2 refills | Status: DC | PRN

## 2022-03-26 MED ORDER — CYCLOBENZAPRINE HCL 10 MG PO TABS: 10.0000 mg | ORAL_TABLET | Freq: Two times a day (BID) | ORAL | 0 refills | Status: DC | PRN

## 2022-03-26 NOTE — Telephone Encounter (Signed)
PA approved.   Request Reference Number: [id number]. LIDOCAINE PAD 5% is approved through 03/19/2023. Your patient may now fill this prescription and it will be covered.

## 2022-03-26 NOTE — Assessment & Plan Note (Signed)
Stable  Con't meds--- albuterol and flovent

## 2022-03-26 NOTE — Assessment & Plan Note (Signed)
Improving  Con't muscle relaxer as needed If she is unable to get lidocaine --- she can get salonpas patches with lidocaine otc

## 2022-03-26 NOTE — Progress Notes (Signed)
Subjective:   By signing my name below, I, Eugene Gavia, attest that this documentation has been prepared under the direction and in the presence of Ann Held, DO 03/26/22   Patient ID: Felicia Martin, female    DOB: January 19, 1933, 89 y.o.   MRN: [id number]  Chief Complaint  Patient presents with   ED follow up    Pt states she was having pulled muscle on the right lower side. Pt states she was having pain with touch but not anymore.     HPI Patient is in today for ED follow up as well as f/u chol, dm and bp.     Patient was seen in the ED on 03/22/22 for complaints of low, right-sided back pain with radiation to her RLQ. Her UA was negative for signs of infection. She had negative troponins x2. Her creatinine was elevated at 1.17 with a BUN of 25. Her CT angio CAP and CT renal stone study did not show any acute findings but did reveal stable fibrotic changes in the right upper lobe, moderate size sliding-type hiatal hernia, enlarged pulmonary artery suggesting pulmonary artery hypertension and a calcified uterine fibroid. She was prescribed Flexeril '10mg'$  and Lidocaine patches at time of discharge home.  Today, patient denies any known injuries or falls precipitating her pain. She states that her pain began on 03/22/21 and felt like a "stitch in (her) kidney." She did take the Flexeril with significant relief of her pain but with some nausea following the first dose. She tried to pick up the Lidocaine patches from the pharmacy but was told that she needed primary care authorization before getting them. Her pain has improved overall, and she now describes it as a mild soreness. She denies any urinary symptoms.  She reports a secondary concern of ear fullness. She had her ears irrigated at urgent care on 03/15/22 but without much relief. Patient reports that she has not been using her ear drops since that visit.   Patient states that she was not contacted by the pharmacy about her  Flovent or Albuterol inhalers being filled so she does not know if they received the prescriptions. I have resent these orders today.  Past Medical History:  Diagnosis Date   Asthma    GERD (gastroesophageal reflux disease)    Hypercholesterolemia    Hypertension    Pulmonary fibrosis ([place])    Sarcoidosis    Surgical history of tubal ligation 1960s    Past Surgical History:  Procedure Laterality Date   COLONOSCOPY  12/09/14   ESOPHAGOGASTRODUODENOSCOPY  12/09/14   LUNG SURGERY  1960   Right lung   Maloney dilation  12/09/14   TUBAL LIGATION  1960    Family History  Problem Relation Age of Onset   Diabetes Sister        x's 3   Alzheimer's disease Sister    Liver cancer Mother    Liver cancer Father    Cancer Father    Cancer Brother    Diabetes Brother    Hypertension Sister        x's 3   Hyperlipidemia Other    Breast cancer Neg Hx     Social History   Socioeconomic History   Marital status: Widowed    Spouse name: Not on file   Number of children: 4   Years of education: Not on file   Highest education level: Not on file  Occupational History   Occupation: Chartered certified accountant  Tobacco  Use   Smoking status: Former    Packs/day: 0.50    Years: 40.00    Total pack years: 20.00    Types: Cigarettes    Quit date: 05/25/1999    Years since quitting: 22.8   Smokeless tobacco: Never  Substance and Sexual Activity   Alcohol use: No   Drug use: No   Sexual activity: Never  Other Topics Concern   Not on file  Social History Narrative   Not on file   Social Determinants of Health   Financial Resource Strain: Low Risk  (11/23/2021)   Overall Financial Resource Strain (CARDIA)    Difficulty of Paying Living Expenses: Not hard at all  Food Insecurity: No Food Insecurity (11/23/2021)   Hunger Vital Sign    Worried About Running Out of Food in the Last Year: Never true    Ran Out of Food in the Last Year: Never true  Transportation Needs: No Transportation Needs  (09/01/2020)   PRAPARE - Hydrologist (Medical): No    Lack of Transportation (Non-Medical): No  Physical Activity: Inactive (11/23/2021)   Exercise Vital Sign    Days of Exercise per Week: 0 days    Minutes of Exercise per Session: 0 min  Stress: No Stress Concern Present (11/10/2020)   [place]    Feeling of Stress : Not at all  Social Connections: Socially Isolated (11/23/2021)   Social Connection and Isolation Panel [NHANES]    Frequency of Communication with Friends and Family: More than three times a week    Frequency of Social Gatherings with Friends and Family: More than three times a week    Attends Religious Services: Never    Marine scientist or Organizations: No    Attends Archivist Meetings: Never    Marital Status: Widowed  Intimate Partner Violence: Not At Risk (11/23/2021)   Humiliation, Afraid, Rape, and Kick questionnaire    Fear of Current or Ex-Partner: No    Emotionally Abused: No    Physically Abused: No    Sexually Abused: No    Outpatient Medications Prior to Visit  Medication Sig Dispense Refill   amLODipine (NORVASC) 5 MG tablet TAKE 1 TABLET(5 MG) BY MOUTH DAILY 90 tablet 1   Blood Glucose Monitoring Suppl (ONETOUCH VERIO) w/Device KIT Use as directed once a day.  Dx code: E11.9 1 kit 0   celecoxib (CELEBREX) 100 MG capsule Take 1 capsule (100 mg total) by mouth 2 (two) times daily. 60 capsule 1   glucose blood (ONETOUCH VERIO) test strip Use to check blood pressure once a day (Dx: type 2 DM 11.9) 100 strip 3   hydrochlorothiazide (HYDRODIURIL) 25 MG tablet Take 1 tablet (25 mg total) by mouth daily. 60 tablet 3   Lancets (ONETOUCH DELICA PLUS BTDVVO16W) MISC USE ONCE A DAY.  DX CODE: E11.9 100 each 1   Metamucil Fiber CHEW Chew 1 each by mouth daily.     metFORMIN (GLUCOPHAGE-XR) 500 MG 24 hr tablet Take 1 tablet (500 mg total) by mouth daily with  breakfast. 90 tablet 1   naproxen sodium (ALEVE) 220 MG tablet Take 220 mg by mouth daily as needed (back pain).     albuterol (VENTOLIN HFA) 108 (90 Base) MCG/ACT inhaler Inhale 2 puffs into the lungs every 6 (six) hours as needed. 18 g 2   cyclobenzaprine (FLEXERIL) 10 MG tablet Take 1 tablet (10 mg total) by  mouth 2 (two) times daily as needed for muscle spasms. 20 tablet 0   fluticasone (FLOVENT HFA) 110 MCG/ACT inhaler Inhale 2 puffs into the lungs 2 (two) times daily. 1 each 12   lidocaine (LIDODERM) 5 % Place 1 patch onto the skin daily. Remove & Discard patch within 12 hours or as directed by MD 30 patch 0   No facility-administered medications prior to visit.    Allergies  Allergen Reactions   Fenofibrate Rash   Statins Other (See Comments)    Muscle aches and weakness   Zetia [Ezetimibe] Other (See Comments)    Muscle pain   Farxiga [Dapagliflozin] Swelling    Patient didn't like increase in urinary frequency and reported swelling    Review of Systems  Constitutional:  Negative for fever and malaise/fatigue.  HENT:  Positive for hearing loss (bilateral, accompanying sensation of ear fullness). Negative for congestion.   Eyes:  Negative for blurred vision.  Respiratory:  Negative for shortness of breath.   Cardiovascular:  Negative for chest pain, palpitations and leg swelling.  Gastrointestinal:  Negative for abdominal pain, blood in stool and nausea.  Genitourinary:  Negative for dysuria, frequency, hematuria and urgency.  Musculoskeletal:  Positive for back pain. Negative for falls.  Skin:  Negative for rash.  Neurological:  Negative for dizziness, loss of consciousness and headaches.  Endo/Heme/Allergies:  Negative for environmental allergies.  Psychiatric/Behavioral:  Negative for depression. The patient is not nervous/anxious.        Objective:    Physical Exam Vitals and nursing note reviewed.  Constitutional:      General: She is not in acute distress.     Appearance: She is well-developed.  HENT:     Head: Normocephalic and atraumatic.     Right Ear: Decreased hearing noted. There is impacted cerumen.     Left Ear: Decreased hearing noted. There is impacted cerumen.     Nose: Nose normal.  Eyes:     General:        Right eye: No discharge.        Left eye: No discharge.  Cardiovascular:     Rate and Rhythm: Normal rate and regular rhythm.     Heart sounds: No murmur heard. Pulmonary:     Effort: Pulmonary effort is normal.     Breath sounds: Normal breath sounds.  Abdominal:     General: Bowel sounds are normal.     Palpations: Abdomen is soft.     Tenderness: There is no abdominal tenderness.  Musculoskeletal:     Cervical back: Normal range of motion and neck supple.  Skin:    General: Skin is warm and dry.  Neurological:     Mental Status: She is alert and oriented to person, place, and time.     Motor: Weakness present.     BP 130/86 (BP Location: Left Arm, Patient Position: Sitting, Cuff Size: Large)   Pulse (!) 110   Temp 98.1 F (36.7 C) (Oral)   Resp 18   Ht 5' (1.524 m)   Wt 197 lb 9.6 oz (89.6 kg)   LMP 03/17/2014   SpO2 94%   BMI 38.59 kg/m  Wt Readings from Last 3 Encounters:  03/26/22 197 lb 9.6 oz (89.6 kg)  03/22/22 212 lb (96.2 kg)  01/04/22 210 lb (95.3 kg)       Assessment & Plan:  Primary pulmonary HTN ([place]) -     Ambulatory referral to Pulmonology  Acute right-sided low back pain  with right-sided sciatica Assessment & Plan: Improving  Con't muscle relaxer as needed If she is unable to get lidocaine --- she can get salonpas patches with lidocaine otc   Orders: -     Lidocaine; Place 1 patch onto the skin daily. Remove & Discard patch within 12 hours or as directed by MD  Dispense: 30 patch; Refill: 0  Mild intermittent asthma, unspecified whether complicated Assessment & Plan: Stable  Con't meds--- albuterol and flovent  Orders: -     Fluticasone Propionate HFA; Inhale 2 puffs into  the lungs 2 (two) times daily.  Dispense: 1 each; Refill: 12 -     Albuterol Sulfate HFA; Inhale 2 puffs into the lungs every 6 (six) hours as needed.  Dispense: 18 g; Refill: 2  Uncomplicated asthma -     Albuterol Sulfate HFA; Inhale 2 puffs into the lungs every 6 (six) hours as needed.  Dispense: 18 g; Refill: 2  Essential hypertension -     Lipid panel -     Hemoglobin A1c -     Comprehensive metabolic panel  Type 2 diabetes mellitus with diabetic neuropathy, without long-term current use of insulin (HCC) -     Lipid panel -     Hemoglobin A1c -     Comprehensive metabolic panel  Hyperlipidemia associated with type 2 diabetes mellitus (HCC) -     Lipid panel -     Hemoglobin A1c -     Comprehensive metabolic panel  Statin intolerance -     Lipid panel -     Hemoglobin A1c -     Comprehensive metabolic panel  Bilateral impacted cerumen Assessment & Plan: Use debrox and return to office for irrigation    Hyperlipidemia, unspecified hyperlipidemia type Assessment & Plan: Encourage heart healthy diet such as MIND or DASH diet, increase exercise, avoid trans fats, simple carbohydrates and processed foods, consider a krill or fish or flaxseed oil cap daily.     Uncontrolled type 2 diabetes mellitus with hyperglycemia (HCC) Assessment & Plan: hgba1c to be checked  minimize simple carbs. Increase exercise as tolerated. Continue current meds    Other orders -     Cyclobenzaprine HCl; Take 1 tablet (10 mg total) by mouth 2 (two) times daily as needed for muscle spasms.  Dispense: 20 tablet; Refill: 0     I,Alexis Herring,acting as a scribe for [employer], DO.,have documented all relevant documentation on the behalf of Ann Held, DO,as directed by  Ann Held, DO while in the presence of [place], DO, personally preformed the services described in this documentation.  All medical record entries made  by the scribe were at my direction and in my presence.  I have reviewed the chart and discharge instructions (if applicable) and agree that the record reflects my personal performance and is accurate and complete. 03/26/22   Ann Held, DO

## 2022-03-26 NOTE — Patient Instructions (Signed)
Acute Back Pain, Adult Acute back pain is sudden and usually short-lived. It is often caused by an injury to the muscles and tissues in the back. The injury may result from: A muscle, tendon, or ligament getting overstretched or torn. Ligaments are tissues that connect bones to each other. Lifting something improperly can cause a back strain. Wear and tear (degeneration) of the spinal disks. Spinal disks are circular tissue that provide cushioning between the bones of the spine (vertebrae). Twisting motions, such as while playing sports or doing yard work. A hit to the back. Arthritis. You may have a physical exam, lab tests, and imaging tests to find the cause of your pain. Acute back pain usually goes away with rest and home care. Follow these instructions at home: Managing pain, stiffness, and swelling Take over-the-counter and prescription medicines only as told by your health care provider. Treatment may include medicines for pain and inflammation that are taken by mouth or applied to the skin, or muscle relaxants. Your health care provider may recommend applying ice during the first 24-48 hours after your pain starts. To do this: Put ice in a plastic bag. Place a towel between your skin and the bag. Leave the ice on for 20 minutes, 2-3 times a day. Remove the ice if your skin turns bright red. This is very important. If you cannot feel pain, heat, or cold, you have a greater risk of damage to the area. If directed, apply heat to the affected area as often as told by your health care provider. Use the heat source that your health care provider recommends, such as a moist heat pack or a heating pad. Place a towel between your skin and the heat source. Leave the heat on for 20-30 minutes. Remove the heat if your skin turns bright red. This is especially important if you are unable to feel pain, heat, or cold. You have a greater risk of getting burned. Activity  Do not stay in bed. Staying in  bed for more than 1-2 days can delay your recovery. Sit up and stand up straight. Avoid leaning forward when you sit or hunching over when you stand. If you work at a desk, sit close to it so you do not need to lean over. Keep your chin tucked in. Keep your neck drawn back, and keep your elbows bent at a 90-degree angle (right angle). Sit high and close to the steering wheel when you drive. Add lower back (lumbar) support to your car seat, if needed. Take short walks on even surfaces as soon as you are able. Try to increase the length of time you walk each day. Do not sit, drive, or stand in one place for more than 30 minutes at a time. Sitting or standing for long periods of time can put stress on your back. Do not drive or use heavy machinery while taking prescription pain medicine. Use proper lifting techniques. When you bend and lift, use positions that put less stress on your back: Bend your knees. Keep the load close to your body. Avoid twisting. Exercise regularly as told by your health care provider. Exercising helps your back heal faster and helps prevent back injuries by keeping muscles strong and flexible. Work with a physical therapist to make a safe exercise program, as recommended by your health care provider. Do any exercises as told by your physical therapist. Lifestyle Maintain a healthy weight. Extra weight puts stress on your back and makes it difficult to have good   posture. Avoid activities or situations that make you feel anxious or stressed. Stress and anxiety increase muscle tension and can make back pain worse. Learn ways to manage anxiety and stress, such as through exercise. General instructions Sleep on a firm mattress in a comfortable position. Try lying on your side with your knees slightly bent. If you lie on your back, put a pillow under your knees. Keep your head and neck in a straight line with your spine (neutral position) when using electronic equipment like  smartphones or pads. To do this: Raise your smartphone or pad to look at it instead of bending your head or neck to look down. Put the smartphone or pad at the level of your face while looking at the screen. Follow your treatment plan as told by your health care provider. This may include: Cognitive or behavioral therapy. Acupuncture or massage therapy. Meditation or yoga. Contact a health care provider if: You have pain that is not relieved with rest or medicine. You have increasing pain going down into your legs or buttocks. Your pain does not improve after 2 weeks. You have pain at night. You lose weight without trying. You have a fever or chills. You develop nausea or vomiting. You develop abdominal pain. Get help right away if: You develop new bowel or bladder control problems. You have unusual weakness or numbness in your arms or legs. You feel faint. These symptoms may represent a serious problem that is an emergency. Do not wait to see if the symptoms will go away. Get medical help right away. Call your local emergency services (911 in the [place] Do not drive yourself to the hospital. Summary Acute back pain is sudden and usually short-lived. Use proper lifting techniques. When you bend and lift, use positions that put less stress on your back. Take over-the-counter and prescription medicines only as told by your health care provider, and apply heat or ice as told. This information is not intended to replace advice given to you by your health care provider. Make sure you discuss any questions you have with your health care provider. Document Revised: 05/27/2020 Document Reviewed: 05/27/2020 Elsevier Patient Education  2023 Elsevier Inc.  

## 2022-03-26 NOTE — Telephone Encounter (Signed)
PA initiated via Covermymeds; KEY: [id number]. Awaiting determination.

## 2022-03-26 NOTE — Assessment & Plan Note (Signed)
Encourage heart healthy diet such as MIND or DASH diet, increase exercise, avoid trans fats, simple carbohydrates and processed foods, consider a krill or fish or flaxseed oil cap daily.  °

## 2022-03-26 NOTE — Assessment & Plan Note (Signed)
Use debrox and return to office for irrigation

## 2022-03-26 NOTE — Assessment & Plan Note (Signed)
hgba1c to be checked minimize simple carbs. Increase exercise as tolerated. Continue current meds 

## 2022-04-03 ENCOUNTER — Ambulatory Visit: Payer: Medicare Other | Admitting: Podiatry

## 2022-04-03 DIAGNOSIS — M79609 Pain in unspecified limb: Secondary | ICD-10-CM

## 2022-04-03 DIAGNOSIS — B351 Tinea unguium: Secondary | ICD-10-CM

## 2022-04-03 NOTE — Progress Notes (Signed)
Subjective: Chief Complaint  Patient presents with   Foot Problem    DFC   89 year old female presents the office today with the above concerns. She states the nails have been painful and they are growing "crooked". No open sores, redness, drianage or sings of infection. No open wounds.   Last A1c 7 on 03/26/2022  Carollee Herter, Kendrick Fries R, DO Last seen 03/26/2022  Objective: AAO x3, NAD DP/PT pulses palpable bilaterally, CRT less than 3 seconds Nails are hypertrophic, dystrophic, brittle, discolored, elongated 10. No surrounding redness or drainage. Tenderness nails 1-5 bilaterally. No open lesions or pre-ulcerative lesions are identified today. No pain with calf compression, swelling, warmth, erythema  Assessment: Symptomatic onychomycosis  Plan: -Treatment options discussed including all alternatives, risks, and complications -Etiology of symptoms were discussed -Nails debrided 10 without complications or bleeding. -Daily foot inspection -Follow-up in 3 months or sooner if any problems arise. In the meantime, encouraged to call the office with any questions, concerns, change in symptoms.   Celesta Gentile, DPM

## 2022-04-09 ENCOUNTER — Telehealth: Payer: Self-pay | Admitting: Family Medicine

## 2022-04-09 NOTE — Telephone Encounter (Signed)
Patient would like to know why she was referred to a cardiologist and pulmonologist. She stated nothing was discuss about being referred to them. Please advise.

## 2022-04-11 NOTE — Telephone Encounter (Signed)
Please advise regarding the pulmonologist

## 2022-04-11 NOTE — Telephone Encounter (Signed)
Pt called. LVM to return call 

## 2022-04-25 ENCOUNTER — Institutional Professional Consult (permissible substitution): Payer: Medicare Other | Admitting: Pulmonary Disease

## 2022-05-01 ENCOUNTER — Telehealth: Payer: Self-pay | Admitting: Family Medicine

## 2022-05-01 NOTE — Telephone Encounter (Signed)
Fine should keep care with PCP until [facility] visit.

## 2022-05-01 NOTE — Telephone Encounter (Signed)
Felicia Martin (daughter DPR OK) called stating that her mother was looking to do a transfer of care from Dr. Etter Sjogren to Dr. Sharlet Salina. She stated pt is mainly looking for a doctors office closer to her. Please Advise.

## 2022-05-01 NOTE — Telephone Encounter (Signed)
Called pt/daughter gave her MD response. Made her a TOC on 05/23/22 @ 9:20.Marland KitchenJohny Chess

## 2022-05-04 DIAGNOSIS — H6123 Impacted cerumen, bilateral: Secondary | ICD-10-CM | POA: Diagnosis not present

## 2022-05-23 ENCOUNTER — Ambulatory Visit (INDEPENDENT_AMBULATORY_CARE_PROVIDER_SITE_OTHER): Payer: Medicare Other | Admitting: Internal Medicine

## 2022-05-23 ENCOUNTER — Encounter: Payer: Self-pay | Admitting: Internal Medicine

## 2022-05-23 VITALS — BP 140/60 | HR 96 | Temp 98.3°F | Ht 60.0 in | Wt 200.1 lb

## 2022-05-23 DIAGNOSIS — E118 Type 2 diabetes mellitus with unspecified complications: Secondary | ICD-10-CM

## 2022-05-23 DIAGNOSIS — T466X5A Adverse effect of antihyperlipidemic and antiarteriosclerotic drugs, initial encounter: Secondary | ICD-10-CM

## 2022-05-23 DIAGNOSIS — I272 Pulmonary hypertension, unspecified: Secondary | ICD-10-CM | POA: Diagnosis not present

## 2022-05-23 DIAGNOSIS — M791 Myalgia, unspecified site: Secondary | ICD-10-CM

## 2022-05-23 DIAGNOSIS — I10 Essential (primary) hypertension: Secondary | ICD-10-CM

## 2022-05-23 DIAGNOSIS — E1142 Type 2 diabetes mellitus with diabetic polyneuropathy: Secondary | ICD-10-CM

## 2022-05-23 NOTE — Progress Notes (Signed)
   Subjective:   Patient ID: Felicia Martin, female    DOB: 23-Aug-1932, 89 y.o.   MRN: [id number]  HPI The patient is an 89 YO female coming in for [facility] and has questions about recent health and care and conditions.   Review of Systems  Constitutional:  Positive for activity change. Negative for appetite change.  HENT: Negative.    Eyes: Negative.   Respiratory:  Positive for cough. Negative for chest tightness and shortness of breath.   Cardiovascular:  Negative for chest pain, palpitations and leg swelling.  Gastrointestinal:  Negative for abdominal distention, abdominal pain, constipation, diarrhea, nausea and vomiting.  Musculoskeletal: Negative.   Skin: Negative.   Neurological: Negative.   Psychiatric/Behavioral: Negative.      Objective:  Physical Exam Constitutional:      Appearance: She is well-developed.  HENT:     Head: Normocephalic and atraumatic.  Cardiovascular:     Rate and Rhythm: Normal rate and regular rhythm.  Pulmonary:     Effort: Pulmonary effort is normal. No respiratory distress.     Breath sounds: Rhonchi present. No wheezing or rales.  Abdominal:     General: Bowel sounds are normal. There is no distension.     Palpations: Abdomen is soft.     Tenderness: There is no abdominal tenderness. There is no rebound.  Musculoskeletal:     Cervical back: Normal range of motion.  Skin:    General: Skin is warm and dry.  Neurological:     Mental Status: She is alert and oriented to person, place, and time.     Coordination: Coordination normal.     Vitals:   05/23/22 0925 05/23/22 1024  BP: (!) 160/80 (!) 140/60  Pulse: 96   Temp: 98.3 F (36.8 C)   TempSrc: Oral   SpO2: 90%   Weight: 200 lb 2 oz (90.8 kg)   Height: 5' (1.524 m)     Assessment & Plan:  Visit time 30 minutes in face to face communication with patient and coordination of care, additional 15 minutes spent in record review, coordination or care, ordering tests,  communicating/referring to other healthcare professionals, documenting in medical records all on the same day of the visit for total time 45 minutes spent on the visit.

## 2022-05-23 NOTE — Patient Instructions (Addendum)
We do not need labs today.    

## 2022-05-25 ENCOUNTER — Encounter: Payer: Self-pay | Admitting: Internal Medicine

## 2022-05-25 DIAGNOSIS — I272 Pulmonary hypertension, unspecified: Secondary | ICD-10-CM | POA: Insufficient documentation

## 2022-05-25 NOTE — Assessment & Plan Note (Signed)
Overall stable and not taking medication for this currently. Talked about fall prevention.

## 2022-05-25 NOTE — Assessment & Plan Note (Signed)
Has been referred to lipid clinic to see if she is willing to do injectable for lipid management. She is above goal.

## 2022-05-25 NOTE — Assessment & Plan Note (Signed)
New likely diagnosis on CT recently. She will be seeing pulmonary in the near future. It is unclear if this is related to her prior pulmonary fibrosis.

## 2022-05-25 NOTE — Assessment & Plan Note (Signed)
Taking metformin 500 mg daily and continue for now. Recent Hga1c at goal. She is not on ACE-I/ARB or statin. She has been referred to the lipid clinic due to intolerance of statins.

## 2022-05-25 NOTE — Assessment & Plan Note (Signed)
BMI 39 but complicated by diabetes, hyperlipidemia and hypertension. Counseled about need for weight loss.

## 2022-05-25 NOTE — Assessment & Plan Note (Signed)
BP initially moderately elevated (did not take BP meds today) with recheck closer to normal. She is taking amlodipine 5 mg daily and hctz 25 mg daily at home and will continue at same dosing.

## 2022-06-12 ENCOUNTER — Ambulatory Visit (INDEPENDENT_AMBULATORY_CARE_PROVIDER_SITE_OTHER): Payer: Medicare Other | Admitting: Podiatry

## 2022-06-12 DIAGNOSIS — M79609 Pain in unspecified limb: Secondary | ICD-10-CM

## 2022-06-12 DIAGNOSIS — B351 Tinea unguium: Secondary | ICD-10-CM | POA: Diagnosis not present

## 2022-06-13 NOTE — Progress Notes (Signed)
Subjective: Chief Complaint  Patient presents with   Diabetic foot care    89 year old female presents the office today with the above concerns. No open sores, redness, drianage or sings of infection. No open wounds.   Last A1c 7 on 03/26/2022  Carollee Herter, Kendrick Fries R, DO Last seen 05/23/2022  Objective: AAO x3, NAD DP/PT pulses palpable bilaterally, CRT less than 3 seconds Nails are hypertrophic, dystrophic, brittle, discolored, elongated 10. No surrounding redness or drainage. Tenderness nails 1-5 bilaterally. No open lesions or pre-ulcerative lesions are identified today. No pain with calf compression, swelling, warmth, erythema  Assessment: Symptomatic onychomycosis  Plan: -Treatment options discussed including all alternatives, risks, and complications -Etiology of symptoms were discussed -Nails debrided 10 without complications or bleeding. -Daily foot inspection -Follow-up in 3 months or sooner if any problems arise. In the meantime, encouraged to call the office with any questions, concerns, change in symptoms.   Celesta Gentile, DPM

## 2022-06-15 ENCOUNTER — Other Ambulatory Visit: Payer: Self-pay | Admitting: Family Medicine

## 2022-06-29 ENCOUNTER — Ambulatory Visit (INDEPENDENT_AMBULATORY_CARE_PROVIDER_SITE_OTHER): Payer: Medicare Other | Admitting: Family Medicine

## 2022-06-29 ENCOUNTER — Encounter: Payer: Self-pay | Admitting: Family Medicine

## 2022-06-29 VITALS — BP 142/86 | HR 95 | Temp 97.6°F | Ht 60.0 in

## 2022-06-29 DIAGNOSIS — R051 Acute cough: Secondary | ICD-10-CM

## 2022-06-29 DIAGNOSIS — J4520 Mild intermittent asthma, uncomplicated: Secondary | ICD-10-CM | POA: Diagnosis not present

## 2022-06-29 MED ORDER — CETIRIZINE HCL 10 MG PO TABS: 10.0000 mg | ORAL_TABLET | Freq: Every day | ORAL | 1 refills | Status: DC

## 2022-06-29 NOTE — Patient Instructions (Signed)
Try to avoid triggers that cause you to cough and have asthma flare ups.   Start using Zyrtec once daily to help reduce your reaction to triggers.   Continue using your Flovent inhaler twice daily (rinse your mouth out after each use)  Continue using albuterol as needed.   Follow up with Dr. Okey Dupre if you are not improving.

## 2022-06-29 NOTE — Progress Notes (Signed)
Subjective:  Felicia Martin is a 89 y.o. female who presents for a one week hx of intermittent cough and chest tightness that is relieved by albuterol inhaler. States symptoms occure after her daughter uses clorox in her house or cooks.  States certain chemicals and smells trigger her asthma.  States today she has been alone in the house and has not had any issues.   Reports good compliance with Flovent and albuterol.   Denies fever, chills, dizziness, sinus pain, nasal congestion, sore throat, chest pain, palpitations, abdominal pain, N/V/D, urinary symptoms, LE edema.     ROS as in subjective.   Objective: Vitals:   06/29/22 1621  BP: (!) 142/86  Pulse: 95  Temp: 97.6 F (36.4 C)  SpO2: 97%    General appearance: Alert, WD/WN, no distress                             Skin: warm, no rash                           Head: no sinus tenderness                            Eyes: conjunctiva normal, corneas clear, PERRLA                            Ears: pearly TMs, external ear canals normal                          Nose: septum midline, turbinates normal, no discharge              Mouth/throat: MMM, tongue normal, no pharyngeal erythema                           Neck: supple, no adenopathy, no thyromegaly, nontender                          Heart: RRR                         Lungs: CTA bilaterally, no wheezes, rales, or rhonchi      Assessment: Acute cough - Plan: cetirizine (ZYRTEC) 10 MG tablet  Mild intermittent asthma without complication - Plan: cetirizine (ZYRTEC) 10 MG tablet   Plan: Reports being in her usual state of health today.  Recommend avoiding allergy and asthma triggers.  Try daily Zyrtec. Continue twice daily Flovent and albuterol as needed. Discussed that chest x-ray, labs or oral steroids are not warranted.  Follow-up with PCP as needed.

## 2022-07-03 ENCOUNTER — Telehealth: Payer: Self-pay

## 2022-07-03 NOTE — Progress Notes (Signed)
   Care Guide Note  07/03/2022 Name: KASY IANNACONE MRN: [id number] DOB: [birth year]  Referred by: Myrlene Broker, MD Reason for referral : Care Coordination Nicholes Stairs to schedule with Pharm d )   HEIDE BROSSART is a 89 y.o. year old female who is a primary care patient of Myrlene Broker, MD. Geoffery Lyons was referred to the pharmacist for assistance related to HTN and DM.    Successful contact was made with the patient to discuss pharmacy services including being ready for the pharmacist to call at least 5 minutes before the scheduled appointment time, to have medication bottles and any blood sugar or blood pressure readings ready for review. The patient agreed to meet with the pharmacist via with the pharmacist via telephone visit on (date/time).  07/05/2022  Penne Lash, RMA Care Guide [facility]  [place], [place] [address] Direct Dial: [phone] [email or web address]

## 2022-07-05 ENCOUNTER — Other Ambulatory Visit: Payer: Medicare Other

## 2022-07-05 NOTE — Progress Notes (Signed)
07/05/2022 Name: Felicia Martin MRN: [id number] DOB: 03-30-1932  Felicia Martin is a 89 y.o. year old female who presented for a telephone visit.   They were referred to the pharmacist by their PCP for assistance in managing diabetes, hypertension, and hyperlipidemia.   Patient is participating in a Managed Medicaid Plan:  No  Subjective: Care Team: Primary Care Provider: Myrlene Broker, MD Medication Access/Adherence Current Pharmacy:  [facility] [phone] [address], [place] - [phone] AT [facility] & [address] [place] [address] Phone: [phone] Fax: [phone]  Patient reports affordability concerns with their medications: Yes  Patient reports access/transportation concerns to their pharmacy: No  Patient reports adherence concerns with their medications:  No    Diabetes: Current medications: Metformin XR  daily with breakfast Medications tried in the past:  Farxiga- could not tolerate increased urination along with diuretic -Current glucose readings: FBG ranges 109-130 -Patient denies hypoglycemic s/sx including dizziness, shakiness, sweating.  -Patient denies hyperglycemic symptoms including polyuria, polydipsia, polyphagia, nocturia, neuropathy, blurred vision. Current meal patterns:  - Breakfast: couple of pieces of bacon and some fruit - Lunch: broiled chicken with salad - Supper: broiled chicken and vegetables - Snacks: rarely snacks - Drinks: water  Hypertension: Current medications: amlodipine  daily, hctz  daily -Patient has a validated, automated, upper arm home BP cuff -Current blood pressure readings readings: 127/70 -Patient denies hypotensive s/sx including dizziness, lightheadedness.  -Patient denies hypertensive symptoms including headache, chest pain, shortness of breath  Hyperlipidemia/ASCVD Risk Reduction Current lipid lowering medications: None Medications tried in  the past: statins, fenofibrates (rash) , ezetimibe (muscle aches) -Patient states all statins she has tried, she tolerates well for about 1 month before developing weakness and aching that almost make her unable to walk Antiplatelet regimen: None  Medication Management and Adherence: -Endorses excellent adherence to medication regimen -Endorses access and affordability with the exception of lidocaine 5% patches -Filled patches once, and they worked well for her; but they were >$80  Objective: Lab Results  Component Value Date   HGBA1C 7.0 (H) 03/26/2022   Lab Results  Component Value Date   CREATININE 1.06 03/26/2022   BUN 33 (H) 03/26/2022   NA 136 03/26/2022   K 3.9 03/26/2022   CL 98 03/26/2022   CO2 27 03/26/2022   Lab Results  Component Value Date   CHOL 289 (H) 03/26/2022   HDL 51.90 03/26/2022   LDLCALC 211 (H) 03/26/2022   LDLDIRECT 230.7 03/18/2013   TRIG 127.0 03/26/2022   CHOLHDL 6 03/26/2022   Assessment/Plan:   Diabetes: - Currently controlled - Comfortable with A1c of <8% in 89 y/o in order to prevent hypoglycemia and associated risk of falls - Recommend to continue current regimen and follow-up with PCP - Recommend to check glucose daily and record  Hypertension: - Currently controlled - Reviewed long term cardiovascular and renal outcomes of uncontrolled blood pressure - Reviewed appropriate blood pressure monitoring technique and reviewed goal blood pressure.        - Recommended to check home blood pressure and heart rate daily - Recommend to continue current regimen and follow up with PCP regularly   Hyperlipidemia/ASCVD Risk Reduction: - Currently uncontrolled.  - Scheduled with lipid clinic 07/18/22  Medication Management and Adherence: -Educated patient that there is a 4% lidocaine patch available OTC that she could try at least before paying >$80 to refill 5% patches  Follow Up Plan: Nothing  scheduled but can follow-up as needed per  PCP  Lenna Gilford, PharmD, DPLA

## 2022-07-06 ENCOUNTER — Ambulatory Visit (HOSPITAL_BASED_OUTPATIENT_CLINIC_OR_DEPARTMENT_OTHER): Payer: Medicare Other | Admitting: Internal Medicine

## 2022-07-18 ENCOUNTER — Ambulatory Visit: Payer: Medicare Other | Attending: Internal Medicine | Admitting: Internal Medicine

## 2022-07-18 ENCOUNTER — Encounter: Payer: Self-pay | Admitting: Internal Medicine

## 2022-07-18 VITALS — BP 142/84 | HR 84 | Ht 60.0 in | Wt 199.8 lb

## 2022-07-18 DIAGNOSIS — M791 Myalgia, unspecified site: Secondary | ICD-10-CM | POA: Diagnosis not present

## 2022-07-18 DIAGNOSIS — E119 Type 2 diabetes mellitus without complications: Secondary | ICD-10-CM

## 2022-07-18 DIAGNOSIS — E7800 Pure hypercholesterolemia, unspecified: Secondary | ICD-10-CM | POA: Diagnosis not present

## 2022-07-18 DIAGNOSIS — T466X5A Adverse effect of antihyperlipidemic and antiarteriosclerotic drugs, initial encounter: Secondary | ICD-10-CM

## 2022-07-18 NOTE — Progress Notes (Addendum)
LIPID CLINIC CONSULT NOTE  Chief Complaint:  Manage dyslipidemia  Primary Care Physician: Myrlene Broker, MD  Primary Cardiologist:  None  HPI:  Felicia Martin is a 89 y.o. female who is being seen today for the evaluation of dyslipidemia at the request of Dr. Okey Dupre. This is an 89 yo female kindly referred for evaluation management of dyslipidemia.  It appears that she has had very high cholesterol for a number of years and unfortunately multi statin, ezetimibe and fenofibrate intolerance in the past.  All of this cause muscle aches, weakness and cramping.  They all seem to resolve after stopping it.  Her last lipid profile showed an LDL cholesterol 211, total 289, triglycerides 127 and HDL 52.  She is a diabetic however A1c is well-controlled at 7.0.  She noted that she has made changes to his diet over the past several months and has lost a few pounds.  She is interested in rechecking her lipids.  We did discuss her age which is a factor when considering primary prevention.  She has no known cardiovascular disease although was noted to have some aortic atherosclerosis on imaging in the past.  I discussed that we would likely expect some cardiovascular risk reduction over an 8 to 10-year.  Therefore it would be aggressive to consider significant lipid-lowering at her age however her cardiac event rate would be higher at this age than at any point.  After some discussion she is interested in potential other treatments to lower her lipids.  PMHx:  Past Medical History:  Diagnosis Date   Asthma    GERD (gastroesophageal reflux disease)    Hypercholesterolemia    Hypertension    Pulmonary fibrosis (HCC)    Sarcoidosis    Surgical history of tubal ligation 1960s    Past Surgical History:  Procedure Laterality Date   COLONOSCOPY  12/09/14   ESOPHAGOGASTRODUODENOSCOPY  12/09/14   LUNG SURGERY  1960   Right lung   Maloney dilation  12/09/14   TUBAL LIGATION  1960    FAMHx:   Family History  Problem Relation Age of Onset   Diabetes Sister        x's 3   Alzheimer's disease Sister    Liver cancer Mother    Liver cancer Father    Cancer Father    Cancer Brother    Diabetes Brother    Hypertension Sister        x's 3   Hyperlipidemia Other    Breast cancer Neg Hx     SOCHx:   reports that she quit smoking about 23 years ago. Her smoking use included cigarettes. She has a 20.00 pack-year smoking history. She has never used smokeless tobacco. She reports that she does not drink alcohol and does not use drugs.  ALLERGIES:  Allergies  Allergen Reactions   Fenofibrate Rash   Statins Other (See Comments)    Muscle aches and weakness   Zetia [Ezetimibe] Other (See Comments)    Muscle pain   Farxiga [Dapagliflozin] Swelling    Patient didn't like increase in urinary frequency and reported swelling    ROS: Pertinent items noted in HPI and remainder of comprehensive ROS otherwise negative.  HOME MEDS: Current Outpatient Medications on File Prior to Visit  Medication Sig Dispense Refill   albuterol (VENTOLIN HFA) 108 (90 Base) MCG/ACT inhaler Inhale 2 puffs into the lungs every 6 (six) hours as needed. 18 g 2   amLODipine (NORVASC) 5 MG tablet TAKE 1  TABLET(5 MG) BY MOUTH DAILY 90 tablet 1   Blood Glucose Monitoring Suppl (ONETOUCH VERIO) w/Device KIT Use as directed once a day.  Dx code: E11.9 1 kit 0   cetirizine (ZYRTEC) 10 MG tablet Take 1 tablet (10 mg total) by mouth daily. 30 tablet 1   fluticasone (FLOVENT HFA) 110 MCG/ACT inhaler Inhale 2 puffs into the lungs 2 (two) times daily. 1 each 12   glucose blood (ONETOUCH VERIO) test strip USE TO CHECK BLOOD GLUCOSE ONCE DAILY 100 strip 3   hydrochlorothiazide (HYDRODIURIL) 25 MG tablet Take 1 tablet (25 mg total) by mouth daily. 60 tablet 3   Lancets (ONETOUCH DELICA PLUS LANCET33G) MISC USE ONCE A DAY.  DX CODE: E11.9 100 each 1   Metamucil Fiber CHEW Chew 1 each by mouth daily.     metFORMIN  (GLUCOPHAGE-XR) 500 MG 24 hr tablet Take 1 tablet (500 mg total) by mouth daily with breakfast. 90 tablet 1   No current facility-administered medications on file prior to visit.    LABS/IMAGING: No results found for this or any previous visit (from the past 48 hour(s)). No results found.  LIPID PANEL:    Component Value Date/Time   CHOL 289 (H) 03/26/2022 1020   TRIG 127.0 03/26/2022 1020   HDL 51.90 03/26/2022 1020   CHOLHDL 6 03/26/2022 1020   VLDL 25.4 03/26/2022 1020   LDLCALC 211 (H) 03/26/2022 1020   LDLDIRECT 230.7 03/18/2013 0805    WEIGHTS: Wt Readings from Last 3 Encounters:  07/18/22 199 lb 12.8 oz (90.6 kg)  05/23/22 200 lb 2 oz (90.8 kg)  03/26/22 197 lb 9.6 oz (89.6 kg)    VITALS: BP (!) 142/84 (BP Location: Left Arm, Patient Position: Sitting, Cuff Size: Large)   Pulse 84   Ht 5' (1.524 m)   Wt 199 lb 12.8 oz (90.6 kg)   LMP 03/17/2014   SpO2 96%   BMI 39.02 kg/m   EXAM: Deferred  EKG: Deferred  ASSESSMENT: Possible familial hyperlipidemia, LDL greater than 190 Aortic atherosclerosis Statin, ezetimibe and fenofibrate intolerance-myalgias, weakness Type 2 diabetes-A1c 7.0% Hypertension  PLAN: 1.   [name] has a possible familial hyperlipidemia with LDL greater than 190.  There is some evidence of aortic atherosclerosis but no known coronary disease and no prior cardiac events.  If she does have FH this is a much lower risk variant as she has lived to 89 years old without prior cardiovascular events.  This does pretend a good prognosis we talked about whether or not she should be so aggressive given her age but she is interested in further lipid lowering and understands that any benefits of these medications typically will take 10 years or so to show some benefit.  I think she is a good candidate for PCSK9 inhibitor therapy given that she likely has a genetic dyslipidemia.  Her target LDL is also less than 70 and she has diabetes.  She asked that  we repeat labs to see what her recent dietary lifestyle changes have done.  Will order an NMR and LP(a) and contact her with those results.  I am considering PCSK9 inhibitor antibody therapy for her.  Thanks as always for the kind referral.  Chrystie Nose, MD, Milagros Loll  [place]  [facility]  Medical Director of the Advanced Lipid Disorders &  Cardiovascular Risk Reduction Clinic Diplomate of the American Board of Clinical Lipidology Attending Cardiologist  Direct Dial: [phone]  Fax: [phone]  Website:  [email or web address]  Lisette Abu Anet Logsdon 07/18/2022, 10:33 AM

## 2022-07-18 NOTE — Patient Instructions (Signed)
Medication Instructions:  NO CHANGES  Every 2 week injectable -- Repatha Sureclick or Praluent Every 6 months -- Leqvio  *If you need a refill on your cardiac medications before your next appointment, please call your pharmacy*   Lab Work: FASTING NMR lipoprofile and LPa  If you have labs (blood work) drawn today and your tests are completely normal, you will receive your results only by: MyChart Message (if you have MyChart) OR A paper copy in the mail If you have any lab test that is abnormal or we need to change your treatment, we will call you to review the results.    Follow-Up: At [facility], you and your health needs are our priority.  As part of our continuing mission to provide you with exceptional heart care, we have created designated Provider Care Teams.  These Care Teams include your primary Cardiologist (physician) and Advanced Practice Providers (APPs -  Physician Assistants and Nurse Practitioners) who all work together to provide you with the care you need, when you need it.  We recommend signing up for the patient portal called "MyChart".  Sign up information is provided on this After Visit Summary.  MyChart is used to connect with patients for Virtual Visits (Telemedicine).  Patients are able to view lab/test results, encounter notes, upcoming appointments, etc.  Non-urgent messages can be sent to your provider as well.   To learn more about what you can do with MyChart, go to [email or web address].    Your next appointment:    4 months with Dr. Rennis Golden

## 2022-07-25 DIAGNOSIS — E7800 Pure hypercholesterolemia, unspecified: Secondary | ICD-10-CM | POA: Diagnosis not present

## 2022-07-27 LAB — NMR, LIPOPROFILE
Cholesterol, Total: 311 mg/dL — ABNORMAL HIGH (ref 100–199)
HDL Particle Number: 28.7 umol/L — ABNORMAL LOW (ref >=30.5)
HDL-C: 60 mg/dL (ref >39)
LDL Particle Number: 2934 nmol/L — ABNORMAL HIGH (ref <1000)
LDL Size: 21.3 nm (ref >20.5)
LDL-C (NIH Calc): 234 mg/dL — ABNORMAL HIGH (ref 0–99)
LP-IR Score: 30 (ref <=45)
Small LDL Particle Number: 1047 nmol/L — ABNORMAL HIGH (ref <=527)
Triglycerides: 99 mg/dL (ref 0–149)

## 2022-07-27 LAB — LIPOPROTEIN A (LPA): Lipoprotein (a): 160.5 nmol/L — ABNORMAL HIGH (ref <75.0)

## 2022-08-09 ENCOUNTER — Telehealth: Payer: Self-pay

## 2022-08-09 ENCOUNTER — Other Ambulatory Visit (HOSPITAL_COMMUNITY): Payer: Self-pay

## 2022-08-09 ENCOUNTER — Telehealth: Payer: Self-pay | Admitting: Internal Medicine

## 2022-08-09 NOTE — Telephone Encounter (Signed)
Patient is calling to get lab results.  Please advise

## 2022-08-09 NOTE — Telephone Encounter (Signed)
Pharmacy Patient Advocate Encounter   Received notification from RN that prior authorization for REPATHA 140MG /ML is required/requested.   PA submitted on 5.23.24 to (ins) [employer] via CoverMyMeds   Key or [facility]) confirmation # [id number] Status is pending

## 2022-08-09 NOTE — Telephone Encounter (Signed)
Called patient and reviewed labs.  She states she does not want to start the process for the medication until she comes back and talks to the doctor at her next appt.  She states she discussed some at this OV but will talk to him again in September.  She asked for copies to be mailed to her of the results.  Mailed.

## 2022-08-15 NOTE — Telephone Encounter (Signed)
Pharmacy Patient Advocate Encounter  Prior Authorization for REPATHAhas been approved by [employer] (ins).    KEY# [id number] Effective dates: 5.23.24 through 11.23.24

## 2022-08-16 ENCOUNTER — Ambulatory Visit (INDEPENDENT_AMBULATORY_CARE_PROVIDER_SITE_OTHER): Payer: Medicare Other | Admitting: Internal Medicine

## 2022-08-16 ENCOUNTER — Encounter: Payer: Self-pay | Admitting: Internal Medicine

## 2022-08-16 VITALS — BP 130/78 | HR 90 | Temp 98.2°F | Ht 60.0 in | Wt 199.0 lb

## 2022-08-16 DIAGNOSIS — R051 Acute cough: Secondary | ICD-10-CM

## 2022-08-16 DIAGNOSIS — R062 Wheezing: Secondary | ICD-10-CM

## 2022-08-16 DIAGNOSIS — E118 Type 2 diabetes mellitus with unspecified complications: Secondary | ICD-10-CM

## 2022-08-16 DIAGNOSIS — I10 Essential (primary) hypertension: Secondary | ICD-10-CM

## 2022-08-16 DIAGNOSIS — Z7984 Long term (current) use of oral hypoglycemic drugs: Secondary | ICD-10-CM

## 2022-08-16 MED ORDER — AZITHROMYCIN 250 MG PO TABS: ORAL_TABLET | ORAL | 1 refills | Status: AC

## 2022-08-16 MED ORDER — PREDNISONE 10 MG PO TABS: ORAL_TABLET | ORAL | 0 refills | Status: DC

## 2022-08-16 MED ORDER — HYDROCODONE BIT-HOMATROP MBR 5-1.5 MG/5ML PO SOLN: 5.0000 mL | Freq: Four times a day (QID) | ORAL | 0 refills | Status: AC | PRN

## 2022-08-16 NOTE — Patient Instructions (Signed)
Please take all new medication as prescribed - the antibiotic, cough medicine, and prednisone ° °Please continue all other medications as before, and refills have been done if requested. ° °Please have the pharmacy call with any other refills you may need. ° °Please keep your appointments with your specialists as you may have planned ° ° ° °

## 2022-08-16 NOTE — Progress Notes (Signed)
Patient ID: NHU SKAHILL, female   DOB: [birth year], 89 y.o.   MRN: [id number]        Chief Complaint: follow up cough and wheezing       HPI:  Felicia Martin is a 89 y.o. female Here with acute onset mild to mod 2-3 days ST, HA, general weakness and malaise, with prod cough greenish sputum, but Pt denies chest pain, increased sob or doe, wheezing, orthopnea, PND, increased LE swelling, palpitations, dizziness or syncope, except for mild wheezing, sob since last pm.   Pt denies polydipsia, polyuria, or new focal neuro s/s.    Pt denies wt loss, night sweats, loss of appetite, or other constitutional symptoms        Wt Readings from Last 3 Encounters:  08/16/22 199 lb (90.3 kg)  07/18/22 199 lb 12.8 oz (90.6 kg)  05/23/22 200 lb 2 oz (90.8 kg)   BP Readings from Last 3 Encounters:  08/16/22 130/78  07/18/22 (!) 142/84  06/29/22 (!) 142/86         Past Medical History:  Diagnosis Date   Asthma    GERD (gastroesophageal reflux disease)    Hypercholesterolemia    Hypertension    Pulmonary fibrosis (HCC)    Sarcoidosis    Surgical history of tubal ligation 1960s   Past Surgical History:  Procedure Laterality Date   COLONOSCOPY  12/09/14   ESOPHAGOGASTRODUODENOSCOPY  12/09/14   LUNG SURGERY  1960   Right lung   Maloney dilation  12/09/14   TUBAL LIGATION  1960    reports that she quit smoking about 23 years ago. Her smoking use included cigarettes. She has a 20.00 pack-year smoking history. She has never used smokeless tobacco. She reports that she does not drink alcohol and does not use drugs. family history includes Alzheimer's disease in her sister; Cancer in her brother and father; Diabetes in her brother and sister; Hyperlipidemia in an other family member; Hypertension in her sister; Liver cancer in her father and mother. Allergies  Allergen Reactions   Fenofibrate Rash   Statins Other (See Comments)    Muscle aches and weakness   Zetia [Ezetimibe] Other (See  Comments)    Muscle pain   Farxiga [Dapagliflozin] Swelling    Patient didn't like increase in urinary frequency and reported swelling   Current Outpatient Medications on File Prior to Visit  Medication Sig Dispense Refill   albuterol (VENTOLIN HFA) 108 (90 Base) MCG/ACT inhaler Inhale 2 puffs into the lungs every 6 (six) hours as needed. 18 g 2   amLODipine (NORVASC) 5 MG tablet TAKE 1 TABLET(5 MG) BY MOUTH DAILY 90 tablet 1   Blood Glucose Monitoring Suppl (ONETOUCH VERIO) w/Device KIT Use as directed once a day.  Dx code: E11.9 1 kit 0   cetirizine (ZYRTEC) 10 MG tablet Take 1 tablet (10 mg total) by mouth daily. 30 tablet 1   fluticasone (FLOVENT HFA) 110 MCG/ACT inhaler Inhale 2 puffs into the lungs 2 (two) times daily. 1 each 12   glucose blood (ONETOUCH VERIO) test strip USE TO CHECK BLOOD GLUCOSE ONCE DAILY 100 strip 3   hydrochlorothiazide (HYDRODIURIL) 25 MG tablet Take 1 tablet (25 mg total) by mouth daily. 60 tablet 3   Lancets (ONETOUCH DELICA PLUS LANCET33G) MISC USE ONCE A DAY.  DX CODE: E11.9 100 each 1   Metamucil Fiber CHEW Chew 1 each by mouth daily.     metFORMIN (GLUCOPHAGE-XR) 500 MG 24 hr tablet Take 1 tablet (500  mg total) by mouth daily with breakfast. 90 tablet 1   No current facility-administered medications on file prior to visit.        ROS:  All others reviewed and negative.  Objective        PE:  BP 130/78 (BP Location: Left Arm, Patient Position: Sitting, Cuff Size: Normal)   Pulse 90   Temp 98.2 F (36.8 C) (Oral)   Ht 5' (1.524 m)   Wt 199 lb (90.3 kg)   LMP 03/17/2014   SpO2 97%   BMI 38.86 kg/m                 Constitutional: Pt appears in NAD               HENT: Head: NCAT.                Right Ear: External ear normal.                 Left Ear: External ear normal.                Eyes: . Pupils are equal, round, and reactive to light. Conjunctivae and EOM are normal               Nose: without d/c or deformity               Neck: Neck  supple. Gross normal ROM               Cardiovascular: Normal rate and regular rhythm.                 Pulmonary/Chest: Effort normal and breath sounds without rales or wheezing.                Abd:  Soft, NT, ND, + BS, no organomegaly               Neurological: Pt is alert. At baseline orientation, motor grossly intact               Skin: Skin is warm. No rashes, no other new lesions, LE edema - nonw               Psychiatric: Pt behavior is normal without agitation   Micro: none  Cardiac tracings I have personally interpreted today:  none  Pertinent Radiological findings (summarize): none   Lab Results  Component Value Date   WBC 5.7 03/22/2022   HGB 12.9 03/22/2022   HCT 39.0 03/22/2022   PLT 332 03/22/2022   GLUCOSE 123 (H) 03/26/2022   CHOL 289 (H) 03/26/2022   TRIG 127.0 03/26/2022   HDL 51.90 03/26/2022   LDLDIRECT 230.7 03/18/2013   LDLCALC 211 (H) 03/26/2022   ALT 8 03/26/2022   AST 15 03/26/2022   NA 136 03/26/2022   K 3.9 03/26/2022   CL 98 03/26/2022   CREATININE 1.06 03/26/2022   BUN 33 (H) 03/26/2022   CO2 27 03/26/2022   TSH 2.19 09/29/2009   HGBA1C 7.0 (H) 03/26/2022   MICROALBUR 0.9 10/12/2021   Assessment/Plan:  Felicia Martin is a 89 y.o. Black or African American [2] female with  has a past medical history of Asthma, GERD (gastroesophageal reflux disease), Hypercholesterolemia, Hypertension, Pulmonary fibrosis (HCC), Sarcoidosis, and Surgical history of tubal ligation (1960s).  Acute cough Mild to mod, c/w bronchitis vs pna, declines cxr, for antibx course, cough med prn,  to f/u any worsening symptoms or concerns  Wheezing Mild to mod,  for predpac asd,,  to f/u any worsening symptoms or concerns  Diabetes mellitus type 2 with complications (HCC) Lab Results  Component Value Date   HGBA1C 7.0 (H) 03/26/2022   Stable, pt to continue current medical treatment metformin ER 500 mg - 1 qd   Primary hypertension BP Readings from Last 3  Encounters:  08/16/22 130/78  07/18/22 (!) 142/84  06/29/22 (!) 142/86   Stable, pt to continue medical treatment norvasc 5 qd, hct 25 qd  Followup: Return if symptoms worsen or fail to improve.  Oliver Barre, MD 08/18/2022 7:36 AM [facility]

## 2022-08-18 ENCOUNTER — Encounter: Payer: Self-pay | Admitting: Internal Medicine

## 2022-08-18 DIAGNOSIS — R062 Wheezing: Secondary | ICD-10-CM | POA: Insufficient documentation

## 2022-08-18 DIAGNOSIS — R051 Acute cough: Secondary | ICD-10-CM | POA: Insufficient documentation

## 2022-08-18 NOTE — Assessment & Plan Note (Signed)
Lab Results  Component Value Date   HGBA1C 7.0 (H) 03/26/2022   Stable, pt to continue current medical treatment metformin ER 500 mg - 1 qd

## 2022-08-18 NOTE — Assessment & Plan Note (Signed)
Mild to mod, c/w bronchitis vs pna, declines cxr, for antibx course, cough med prn,  to f/u any worsening symptoms or concerns 

## 2022-08-18 NOTE — Assessment & Plan Note (Signed)
BP Readings from Last 3 Encounters:  08/16/22 130/78  07/18/22 (!) 142/84  06/29/22 (!) 142/86   Stable, pt to continue medical treatment norvasc 5 qd, hct 25 qd

## 2022-08-18 NOTE — Assessment & Plan Note (Signed)
Mild to mod, for predpac asd,,  to f/u any worsening symptoms or concerns 

## 2022-09-13 ENCOUNTER — Ambulatory Visit (INDEPENDENT_AMBULATORY_CARE_PROVIDER_SITE_OTHER): Payer: Medicare Other | Admitting: Podiatry

## 2022-09-13 DIAGNOSIS — M79609 Pain in unspecified limb: Secondary | ICD-10-CM | POA: Diagnosis not present

## 2022-09-13 DIAGNOSIS — E1149 Type 2 diabetes mellitus with other diabetic neurological complication: Secondary | ICD-10-CM | POA: Diagnosis not present

## 2022-09-13 DIAGNOSIS — B351 Tinea unguium: Secondary | ICD-10-CM | POA: Diagnosis not present

## 2022-09-16 NOTE — Progress Notes (Signed)
Subjective: Chief Complaint  Patient presents with   Nail Problem    Diabetic Foot Care- nail trim      89 year old female presents the office today with the above concerns. No open sores, redness, drianage or sings of infection. No open wounds.   Last A1c 7 on 03/26/2022  Zola Button, Myrene Buddy R, DO Last seen 08/16/2022  Objective: AAO x3, NAD DP/PT pulses palpable bilaterally, CRT less than 3 seconds Nails are hypertrophic, dystrophic, brittle, discolored, elongated 10. No surrounding redness or drainage. Tenderness nails 1-5 bilaterally. No open lesions or pre-ulcerative lesions are identified today. No pain with calf compression, swelling, warmth, erythema  Assessment: Symptomatic onychomycosis  Plan: -Treatment options discussed including all alternatives, risks, and complications -Etiology of symptoms were discussed -Nails debrided 10 without complications or bleeding. -Daily foot inspection -Follow-up in 3 months or sooner if any problems arise. In the meantime, encouraged to call the office with any questions, concerns, change in symptoms.   Return in about 3 months (around 12/14/2022).  [name], DPM

## 2022-11-28 ENCOUNTER — Ambulatory Visit: Payer: Medicare Other | Admitting: Internal Medicine

## 2022-12-04 ENCOUNTER — Encounter: Payer: Self-pay | Admitting: Internal Medicine

## 2022-12-04 ENCOUNTER — Ambulatory Visit (INDEPENDENT_AMBULATORY_CARE_PROVIDER_SITE_OTHER): Payer: Medicare Other | Admitting: Internal Medicine

## 2022-12-04 VITALS — BP 122/80 | HR 92 | Temp 98.1°F | Ht 60.0 in | Wt 197.0 lb

## 2022-12-04 DIAGNOSIS — J4520 Mild intermittent asthma, uncomplicated: Secondary | ICD-10-CM | POA: Diagnosis not present

## 2022-12-04 DIAGNOSIS — E1142 Type 2 diabetes mellitus with diabetic polyneuropathy: Secondary | ICD-10-CM

## 2022-12-04 DIAGNOSIS — E785 Hyperlipidemia, unspecified: Secondary | ICD-10-CM

## 2022-12-04 DIAGNOSIS — E118 Type 2 diabetes mellitus with unspecified complications: Secondary | ICD-10-CM | POA: Diagnosis not present

## 2022-12-04 DIAGNOSIS — Z0000 Encounter for general adult medical examination without abnormal findings: Secondary | ICD-10-CM | POA: Diagnosis not present

## 2022-12-04 DIAGNOSIS — E1169 Type 2 diabetes mellitus with other specified complication: Secondary | ICD-10-CM | POA: Diagnosis not present

## 2022-12-04 DIAGNOSIS — I10 Essential (primary) hypertension: Secondary | ICD-10-CM | POA: Diagnosis not present

## 2022-12-04 DIAGNOSIS — T466X5A Adverse effect of antihyperlipidemic and antiarteriosclerotic drugs, initial encounter: Secondary | ICD-10-CM

## 2022-12-04 DIAGNOSIS — G720 Drug-induced myopathy: Secondary | ICD-10-CM

## 2022-12-04 DIAGNOSIS — R600 Localized edema: Secondary | ICD-10-CM

## 2022-12-04 DIAGNOSIS — Z7984 Long term (current) use of oral hypoglycemic drugs: Secondary | ICD-10-CM | POA: Diagnosis not present

## 2022-12-04 LAB — POCT GLYCOSYLATED HEMOGLOBIN (HGB A1C): HbA1c POC (<> result, manual entry): 6.7 % (ref 4.0–5.6)

## 2022-12-04 LAB — CBC
HCT: 37.7 % (ref 36.0–46.0)
Hemoglobin: 12.5 g/dL (ref 12.0–15.0)
MCHC: 33.1 g/dL (ref 30.0–36.0)
MCV: 92.3 fl (ref 78.0–100.0)
Platelets: 379 10*3/uL (ref 150.0–400.0)
RBC: 4.09 Mil/uL (ref 3.87–5.11)
RDW: 13.1 % (ref 11.5–15.5)
WBC: 5.7 10*3/uL (ref 4.0–10.5)

## 2022-12-04 LAB — MICROALBUMIN / CREATININE URINE RATIO
Creatinine,U: 163.3 mg/dL
Microalb Creat Ratio: 1.2 mg/g (ref 0.0–30.0)
Microalb, Ur: 2 mg/dL — ABNORMAL HIGH (ref 0.0–1.9)

## 2022-12-04 LAB — COMPREHENSIVE METABOLIC PANEL
ALT: 7 U/L (ref 0–35)
AST: 14 U/L (ref 0–37)
Albumin: 4 g/dL (ref 3.5–5.2)
Alkaline Phosphatase: 65 U/L (ref 39–117)
BUN: 17 mg/dL (ref 6–23)
CO2: 29 meq/L (ref 19–32)
Calcium: 9.6 mg/dL (ref 8.4–10.5)
Chloride: 100 meq/L (ref 96–112)
Creatinine, Ser: 0.97 mg/dL (ref 0.40–1.20)
GFR: 52.41 mL/min — ABNORMAL LOW (ref >60.00)
Glucose, Bld: 130 mg/dL — ABNORMAL HIGH (ref 70–99)
Potassium: 3.5 meq/L (ref 3.5–5.1)
Sodium: 137 meq/L (ref 135–145)
Total Bilirubin: 0.5 mg/dL (ref 0.2–1.2)
Total Protein: 8.3 g/dL (ref 6.0–8.3)

## 2022-12-04 LAB — LIPID PANEL
Cholesterol: 293 mg/dL — ABNORMAL HIGH (ref 0–200)
HDL: 53 mg/dL (ref >39.00)
LDL Cholesterol: 210 mg/dL — ABNORMAL HIGH (ref 0–99)
NonHDL: 239.54
Total CHOL/HDL Ratio: 6
Triglycerides: 148 mg/dL (ref 0.0–149.0)
VLDL: 29.6 mg/dL (ref 0.0–40.0)

## 2022-12-04 MED ORDER — HYDROCHLOROTHIAZIDE 25 MG PO TABS: 25.0000 mg | ORAL_TABLET | Freq: Every day | ORAL | 3 refills | Status: DC

## 2022-12-04 MED ORDER — AMLODIPINE BESYLATE 5 MG PO TABS: 5.0000 mg | ORAL_TABLET | Freq: Every day | ORAL | 3 refills | Status: AC

## 2022-12-04 MED ORDER — METFORMIN HCL ER 500 MG PO TB24: 500.0000 mg | ORAL_TABLET | Freq: Every day | ORAL | 3 refills | Status: DC

## 2022-12-04 MED ORDER — ALBUTEROL SULFATE HFA 108 (90 BASE) MCG/ACT IN AERS: 2.0000 | INHALATION_SPRAY | Freq: Four times a day (QID) | RESPIRATORY_TRACT | 2 refills | Status: AC | PRN

## 2022-12-04 MED ORDER — FLUTICASONE PROPIONATE HFA 110 MCG/ACT IN AERO: 2.0000 | INHALATION_SPRAY | Freq: Two times a day (BID) | RESPIRATORY_TRACT | 12 refills | Status: DC

## 2022-12-04 NOTE — Assessment & Plan Note (Signed)
Stable neuropathy on exam. Foot exam done. Checking microalbumin to creatinine ratio and CMP and HgA1c done POC and stable and controlled on metformin 500 mg daily.

## 2022-12-04 NOTE — Patient Instructions (Signed)
We will check the labs today.

## 2022-12-04 NOTE — Assessment & Plan Note (Signed)
Checking lipid panel and not taking statin currently. Adjust as needed. 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

## 2022-12-04 NOTE — Assessment & Plan Note (Signed)
BP at goal on amlodipine 5 mg daily and hydrochlorothiazide 25 mg daily and will continue. Checking CMP and adjust as needed.

## 2022-12-04 NOTE — Assessment & Plan Note (Signed)
Stable, needs refill on flovent and albuterol which she uses more fall/spring. Recent covid-19 and is recovering without flare.

## 2022-12-04 NOTE — Assessment & Plan Note (Signed)
Flu shot yearly. Pneumonia counseled declines today. Shingrix due at pharmacy. Tetanus due at pharmacy. Colonoscopy aged out. Mammogram aged out she is still getting counseled, pap smear aged out and dexa complete. Counseled about sun safety and mole surveillance. Counseled about the dangers of distracted driving. Given 10 year screening recommendations.

## 2022-12-04 NOTE — Assessment & Plan Note (Signed)
BMI 38 and complicated by diabetes, hypertension, hyperlipidemia. Counseled on diet and taking metformin.

## 2022-12-04 NOTE — Assessment & Plan Note (Signed)
Not on statin currently but past reaction to multiple statin.

## 2022-12-04 NOTE — Progress Notes (Signed)
Subjective:   Patient ID: Felicia Martin, female    DOB: 10/21/1932, 89 y.o.   MRN: [id number]  HPI Here for medicare wellness and physical, with recent episode of covid-19 about 1 week ago. Please see A/P for status and treatment of chronic medical problems.   Diet: DM since diabetic Physical activity: sedentary, works out 1-2 times per week Depression/mood screen: negative Hearing: intact to whispered voice, mild loss bilaterally Visual acuity: grossly normal with lens, performs annual eye exam  ADLs: capable Fall risk: none Home safety: good Cognitive evaluation: intact to orientation, naming, recall and repetition EOL planning: adv directives discussed, not in place  [employer] Visit from 12/04/2022 in [facility] [place]  PHQ-2 Total Score 0       Flowsheet Row Office Visit from 05/23/2022 in [facility] [place]  PHQ-9 Total Score 0         03/22/2022    9:00 AM 05/23/2022    9:24 AM 08/16/2022    3:06 PM 12/04/2022    8:19 AM 12/04/2022    8:54 AM  Fall Risk  Falls in the past year?  0 0 0 0  Was there an injury with Fall?  0 0 0   Fall Risk Category Calculator  0 0 0   (RETIRED) Patient Fall Risk Level Low fall risk      Patient at Risk for Falls Due to   No Fall Risks    Fall risk Follow up  Falls evaluation completed Falls evaluation completed Falls evaluation completed     I have personally reviewed and have noted 1. The patient's medical and social history - reviewed today no changes 2. Their use of alcohol, tobacco or illicit drugs 3. Their current medications and supplements 4. The patient's functional ability including ADL's, fall risks, home safety risks and hearing or visual impairment. 5. Diet and physical activities 6. Evidence for depression or mood disorders 7. Care team reviewed and updated 8.  The patient is not on an opioid pain medication.  Patient Care Team: Myrlene Broker, MD as PCP - General (Internal Medicine) Henrene Pastor, RPH-CPP (Pharmacist) Past Medical History:  Diagnosis Date   Asthma    GERD (gastroesophageal reflux disease)    Hypercholesterolemia    Hypertension    Pulmonary fibrosis (HCC)    Sarcoidosis    Surgical history of tubal ligation 1960s   Past Surgical History:  Procedure Laterality Date   COLONOSCOPY  12/09/14   ESOPHAGOGASTRODUODENOSCOPY  12/09/14   LUNG SURGERY  1960   Right lung   Maloney dilation  12/09/14   TUBAL LIGATION  1960   Family History  Problem Relation Age of Onset   Diabetes Sister        x's 3   Alzheimer's disease Sister    Liver cancer Mother    Liver cancer Father    Cancer Father    Cancer Brother    Diabetes Brother    Hypertension Sister        x's 3   Hyperlipidemia Other    Breast cancer Neg Hx    Review of Systems  Constitutional: Negative.   HENT: Negative.    Eyes: Negative.   Respiratory:  Negative for cough, chest tightness and shortness of breath.   Cardiovascular:  Negative for chest pain, palpitations and leg swelling.  Gastrointestinal:  Negative for abdominal distention, abdominal pain, constipation, diarrhea, nausea and vomiting.  Musculoskeletal: Negative.  Skin: Negative.   Neurological: Negative.   Psychiatric/Behavioral: Negative.      Objective:  Physical Exam Constitutional:      Appearance: She is well-developed.  HENT:     Head: Normocephalic and atraumatic.  Cardiovascular:     Rate and Rhythm: Normal rate and regular rhythm.  Pulmonary:     Effort: Pulmonary effort is normal. No respiratory distress.     Breath sounds: Normal breath sounds. No wheezing or rales.  Abdominal:     General: Bowel sounds are normal. There is no distension.     Palpations: Abdomen is soft.     Tenderness: There is no abdominal tenderness. There is no rebound.  Musculoskeletal:     Cervical back: Normal range of motion.  Skin:    General: Skin is warm and dry.   Neurological:     Mental Status: She is alert and oriented to person, place, and time.     Coordination: Coordination normal.     Vitals:   12/04/22 0817  BP: 122/80  Pulse: 92  Temp: 98.1 F (36.7 C)  TempSrc: Oral  SpO2: 92%  Weight: 197 lb (89.4 kg)  Height: 5' (1.524 m)    Assessment & Plan:

## 2022-12-04 NOTE — Assessment & Plan Note (Signed)
Overall stable and no injuries on foot exam.

## 2022-12-05 ENCOUNTER — Telehealth: Payer: Self-pay | Admitting: Internal Medicine

## 2022-12-05 NOTE — Telephone Encounter (Signed)
Patient returned Micaiah's call about her lab results and would like a call back at [phone].

## 2022-12-05 NOTE — Telephone Encounter (Signed)
Called pt back and spoke with her about this

## 2022-12-13 ENCOUNTER — Other Ambulatory Visit: Payer: Self-pay | Admitting: Internal Medicine

## 2022-12-13 ENCOUNTER — Ambulatory Visit: Payer: Medicare Other | Admitting: Podiatry

## 2022-12-13 ENCOUNTER — Other Ambulatory Visit: Payer: Self-pay

## 2022-12-13 MED ORDER — PITAVASTATIN CALCIUM 1 MG PO TABS: 1.0000 mg | ORAL_TABLET | Freq: Every day | ORAL | 2 refills | Status: DC

## 2022-12-13 MED ORDER — METFORMIN HCL ER 500 MG PO TB24: 500.0000 mg | ORAL_TABLET | Freq: Every day | ORAL | 3 refills | Status: AC

## 2022-12-14 ENCOUNTER — Other Ambulatory Visit: Payer: Self-pay

## 2022-12-14 ENCOUNTER — Observation Stay (HOSPITAL_BASED_OUTPATIENT_CLINIC_OR_DEPARTMENT_OTHER)
Admission: EM | Admit: 2022-12-14 | Discharge: 2022-12-15 | Disposition: A | Discharge status: Home / Self Care | Payer: Medicare Other | Attending: Internal Medicine | Admitting: Internal Medicine

## 2022-12-14 ENCOUNTER — Ambulatory Visit: Payer: Medicare Other | Admitting: Internal Medicine

## 2022-12-14 ENCOUNTER — Emergency Department (HOSPITAL_BASED_OUTPATIENT_CLINIC_OR_DEPARTMENT_OTHER): Payer: Medicare Other

## 2022-12-14 ENCOUNTER — Encounter (HOSPITAL_BASED_OUTPATIENT_CLINIC_OR_DEPARTMENT_OTHER): Payer: Self-pay

## 2022-12-14 DIAGNOSIS — J449 Chronic obstructive pulmonary disease, unspecified: Secondary | ICD-10-CM | POA: Insufficient documentation

## 2022-12-14 DIAGNOSIS — E785 Hyperlipidemia, unspecified: Secondary | ICD-10-CM | POA: Diagnosis present

## 2022-12-14 DIAGNOSIS — J441 Chronic obstructive pulmonary disease with (acute) exacerbation: Secondary | ICD-10-CM | POA: Diagnosis not present

## 2022-12-14 DIAGNOSIS — J40 Bronchitis, not specified as acute or chronic: Secondary | ICD-10-CM | POA: Diagnosis not present

## 2022-12-14 DIAGNOSIS — Z7984 Long term (current) use of oral hypoglycemic drugs: Secondary | ICD-10-CM | POA: Diagnosis not present

## 2022-12-14 DIAGNOSIS — Z79899 Other long term (current) drug therapy: Secondary | ICD-10-CM | POA: Insufficient documentation

## 2022-12-14 DIAGNOSIS — E119 Type 2 diabetes mellitus without complications: Secondary | ICD-10-CM | POA: Diagnosis not present

## 2022-12-14 DIAGNOSIS — J45901 Unspecified asthma with (acute) exacerbation: Secondary | ICD-10-CM | POA: Insufficient documentation

## 2022-12-14 DIAGNOSIS — E1142 Type 2 diabetes mellitus with diabetic polyneuropathy: Secondary | ICD-10-CM | POA: Insufficient documentation

## 2022-12-14 DIAGNOSIS — I10 Essential (primary) hypertension: Secondary | ICD-10-CM | POA: Insufficient documentation

## 2022-12-14 DIAGNOSIS — Z87891 Personal history of nicotine dependence: Secondary | ICD-10-CM | POA: Diagnosis not present

## 2022-12-14 DIAGNOSIS — Z6838 Body mass index (BMI) 38.0-38.9, adult: Secondary | ICD-10-CM | POA: Insufficient documentation

## 2022-12-14 DIAGNOSIS — R0600 Dyspnea, unspecified: Principal | ICD-10-CM

## 2022-12-14 DIAGNOSIS — R0602 Shortness of breath: Secondary | ICD-10-CM | POA: Diagnosis not present

## 2022-12-14 DIAGNOSIS — K219 Gastro-esophageal reflux disease without esophagitis: Secondary | ICD-10-CM | POA: Diagnosis not present

## 2022-12-14 DIAGNOSIS — U071 COVID-19: Secondary | ICD-10-CM | POA: Diagnosis not present

## 2022-12-14 DIAGNOSIS — E118 Type 2 diabetes mellitus with unspecified complications: Secondary | ICD-10-CM

## 2022-12-14 DIAGNOSIS — Z862 Personal history of diseases of the blood and blood-forming organs and certain disorders involving the immune mechanism: Secondary | ICD-10-CM | POA: Diagnosis not present

## 2022-12-14 DIAGNOSIS — R062 Wheezing: Secondary | ICD-10-CM | POA: Diagnosis not present

## 2022-12-14 DIAGNOSIS — R059 Cough, unspecified: Secondary | ICD-10-CM | POA: Diagnosis not present

## 2022-12-14 LAB — COMPREHENSIVE METABOLIC PANEL
ALT: 10 U/L (ref 0–44)
AST: 18 U/L (ref 15–41)
Albumin: 3.8 g/dL (ref 3.5–5.0)
Alkaline Phosphatase: 62 U/L (ref 38–126)
Anion gap: 9 (ref 5–15)
BUN: 19 mg/dL (ref 8–23)
CO2: 25 mmol/L (ref 22–32)
Calcium: 9.3 mg/dL (ref 8.9–10.3)
Chloride: 105 mmol/L (ref 98–111)
Creatinine, Ser: 0.8 mg/dL (ref 0.44–1.00)
GFR, Estimated: >60 mL/min (ref >60)
Glucose, Bld: 124 mg/dL — ABNORMAL HIGH (ref 70–99)
Potassium: 3.7 mmol/L (ref 3.5–5.1)
Sodium: 139 mmol/L (ref 135–145)
Total Bilirubin: 0.7 mg/dL (ref 0.3–1.2)
Total Protein: 8.6 g/dL — ABNORMAL HIGH (ref 6.5–8.1)

## 2022-12-14 LAB — CBC WITH DIFFERENTIAL/PLATELET
Abs Immature Granulocytes: 0.01 10*3/uL (ref 0.00–0.07)
Basophils Absolute: 0.1 10*3/uL (ref 0.0–0.1)
Basophils Relative: 1 %
Eosinophils Absolute: 0.3 10*3/uL (ref 0.0–0.5)
Eosinophils Relative: 6 %
HCT: 36.3 % (ref 36.0–46.0)
Hemoglobin: 12.1 g/dL (ref 12.0–15.0)
Immature Granulocytes: 0 %
Lymphocytes Relative: 28 %
Lymphs Abs: 1.5 10*3/uL (ref 0.7–4.0)
MCH: 30.8 pg (ref 26.0–34.0)
MCHC: 33.3 g/dL (ref 30.0–36.0)
MCV: 92.4 fL (ref 80.0–100.0)
Monocytes Absolute: 0.6 10*3/uL (ref 0.1–1.0)
Monocytes Relative: 12 %
Neutro Abs: 2.9 10*3/uL (ref 1.7–7.7)
Neutrophils Relative %: 53 %
Platelets: 297 10*3/uL (ref 150–400)
RBC: 3.93 MIL/uL (ref 3.87–5.11)
RDW: 12.4 % (ref 11.5–15.5)
WBC: 5.5 10*3/uL (ref 4.0–10.5)
nRBC: 0 % (ref 0.0–0.2)

## 2022-12-14 LAB — RESP PANEL BY RT-PCR (RSV, FLU A&B, COVID)  RVPGX2
Influenza A by PCR: NEGATIVE
Influenza B by PCR: NEGATIVE
Resp Syncytial Virus by PCR: NEGATIVE
SARS Coronavirus 2 by RT PCR: POSITIVE — AB

## 2022-12-14 LAB — TROPONIN I (HIGH SENSITIVITY): Troponin I (High Sensitivity): 3 ng/L (ref <18)

## 2022-12-14 LAB — BRAIN NATRIURETIC PEPTIDE: B Natriuretic Peptide: 41.3 pg/mL (ref 0.0–100.0)

## 2022-12-14 LAB — GLUCOSE, CAPILLARY: Glucose-Capillary: 352 mg/dL — ABNORMAL HIGH (ref 70–99)

## 2022-12-14 MED ORDER — ACETAMINOPHEN 650 MG RE SUPP: 650.0000 mg | Freq: Four times a day (QID) | RECTAL | Status: DC | PRN

## 2022-12-14 MED ORDER — IPRATROPIUM-ALBUTEROL 0.5-2.5 (3) MG/3ML IN SOLN
3.0000 mL | Freq: Once | RESPIRATORY_TRACT | Status: AC
  Administered 2022-12-14: 3 mL via RESPIRATORY_TRACT
  Filled 2022-12-14: qty 3

## 2022-12-14 MED ORDER — HYDRALAZINE HCL 20 MG/ML IJ SOLN
5.0000 mg | Freq: Three times a day (TID) | INTRAMUSCULAR | Status: DC | PRN
  Administered 2022-12-15: 5 mg via INTRAVENOUS
  Filled 2022-12-14: qty 1

## 2022-12-14 MED ORDER — ACETAMINOPHEN 325 MG PO TABS: 650.0000 mg | ORAL_TABLET | Freq: Four times a day (QID) | ORAL | Status: DC | PRN

## 2022-12-14 MED ORDER — ONDANSETRON HCL 4 MG/2ML IJ SOLN: 4.0000 mg | Freq: Four times a day (QID) | INTRAMUSCULAR | Status: DC | PRN

## 2022-12-14 MED ORDER — SODIUM CHLORIDE 0.9% FLUSH: 3.0000 mL | INTRAVENOUS | Status: DC | PRN

## 2022-12-14 MED ORDER — SODIUM CHLORIDE 0.9 % IV SOLN
500.0000 mg | Freq: Once | INTRAVENOUS | Status: AC
  Administered 2022-12-14: 500 mg via INTRAVENOUS

## 2022-12-14 MED ORDER — SODIUM CHLORIDE 0.9 % IV SOLN: 500.0000 mg | INTRAVENOUS | Status: DC

## 2022-12-14 MED ORDER — PRAVASTATIN SODIUM 20 MG PO TABS: 20.0000 mg | ORAL_TABLET | Freq: Every day | ORAL | Status: DC

## 2022-12-14 MED ORDER — SODIUM CHLORIDE 0.9% FLUSH
3.0000 mL | Freq: Two times a day (BID) | INTRAVENOUS | Status: DC
  Administered 2022-12-14 – 2022-12-15 (×2): 3 mL via INTRAVENOUS

## 2022-12-14 MED ORDER — GUAIFENESIN ER 600 MG PO TB12: 600.0000 mg | ORAL_TABLET | Freq: Two times a day (BID) | ORAL | Status: DC

## 2022-12-14 MED ORDER — METHYLPREDNISOLONE SODIUM SUCC 125 MG IJ SOLR
62.5000 mg | Freq: Once | INTRAMUSCULAR | Status: AC
  Administered 2022-12-14: 62.5 mg via INTRAVENOUS
  Filled 2022-12-14: qty 2

## 2022-12-14 MED ORDER — METHYLPREDNISOLONE SODIUM SUCC 125 MG IJ SOLR
80.0000 mg | Freq: Two times a day (BID) | INTRAMUSCULAR | Status: DC
  Administered 2022-12-14 – 2022-12-15 (×2): 80 mg via INTRAVENOUS
  Filled 2022-12-14 (×2): qty 2

## 2022-12-14 MED ORDER — ALBUTEROL SULFATE (2.5 MG/3ML) 0.083% IN NEBU
2.5000 mg | INHALATION_SOLUTION | Freq: Once | RESPIRATORY_TRACT | Status: AC
  Administered 2022-12-14: 2.5 mg via RESPIRATORY_TRACT
  Filled 2022-12-14: qty 3

## 2022-12-14 MED ORDER — LEVALBUTEROL HCL 0.63 MG/3ML IN NEBU
0.6300 mg | INHALATION_SOLUTION | Freq: Four times a day (QID) | RESPIRATORY_TRACT | Status: DC
  Administered 2022-12-15 (×2): 0.63 mg via RESPIRATORY_TRACT
  Filled 2022-12-14 (×2): qty 3

## 2022-12-14 MED ORDER — BENZONATATE 100 MG PO CAPS: 100.0000 mg | ORAL_CAPSULE | Freq: Three times a day (TID) | ORAL | 0 refills | Status: DC

## 2022-12-14 MED ORDER — MENTHOL 3 MG MT LOZG: 1.0000 | LOZENGE | OROMUCOSAL | Status: DC | PRN

## 2022-12-14 MED ORDER — ARFORMOTEROL TARTRATE 15 MCG/2ML IN NEBU: 15.0000 ug | INHALATION_SOLUTION | Freq: Two times a day (BID) | RESPIRATORY_TRACT | Status: DC

## 2022-12-14 MED ORDER — INSULIN ASPART 100 UNIT/ML IJ SOLN
0.0000 [IU] | Freq: Three times a day (TID) | INTRAMUSCULAR | Status: DC
  Administered 2022-12-15: 2 [IU] via SUBCUTANEOUS
  Administered 2022-12-15: 3 [IU] via SUBCUTANEOUS

## 2022-12-14 MED ORDER — ALBUTEROL SULFATE (2.5 MG/3ML) 0.083% IN NEBU: 2.5000 mg | INHALATION_SOLUTION | RESPIRATORY_TRACT | Status: DC | PRN

## 2022-12-14 MED ORDER — IPRATROPIUM-ALBUTEROL 0.5-2.5 (3) MG/3ML IN SOLN
3.0000 mL | RESPIRATORY_TRACT | Status: AC
  Administered 2022-12-14 (×2): 3 mL via RESPIRATORY_TRACT
  Filled 2022-12-14: qty 6

## 2022-12-14 MED ORDER — PREDNISONE 10 MG PO TABS: 40.0000 mg | ORAL_TABLET | Freq: Every day | ORAL | 0 refills | Status: DC

## 2022-12-14 MED ORDER — GUAIFENESIN ER 600 MG PO TB12
1200.0000 mg | ORAL_TABLET | Freq: Two times a day (BID) | ORAL | Status: DC
  Administered 2022-12-14 – 2022-12-15 (×2): 1200 mg via ORAL
  Filled 2022-12-14 (×2): qty 2

## 2022-12-14 MED ORDER — ONDANSETRON HCL 4 MG PO TABS: 4.0000 mg | ORAL_TABLET | Freq: Four times a day (QID) | ORAL | Status: DC | PRN

## 2022-12-14 MED ORDER — SENNOSIDES-DOCUSATE SODIUM 8.6-50 MG PO TABS: 1.0000 | ORAL_TABLET | Freq: Every evening | ORAL | Status: DC | PRN

## 2022-12-14 MED ORDER — LORATADINE 10 MG PO TABS
10.0000 mg | ORAL_TABLET | Freq: Every day | ORAL | Status: DC
  Administered 2022-12-15: 10 mg via ORAL
  Filled 2022-12-14: qty 1

## 2022-12-14 MED ORDER — METHYLPREDNISOLONE SODIUM SUCC 40 MG IJ SOLR: 40.0000 mg | Freq: Two times a day (BID) | INTRAMUSCULAR | Status: DC

## 2022-12-14 MED ORDER — ENOXAPARIN SODIUM 40 MG/0.4ML IJ SOSY
40.0000 mg | PREFILLED_SYRINGE | INTRAMUSCULAR | Status: DC
  Administered 2022-12-14: 40 mg via SUBCUTANEOUS
  Filled 2022-12-14: qty 0.4

## 2022-12-14 MED ORDER — AMLODIPINE BESYLATE 5 MG PO TABS
5.0000 mg | ORAL_TABLET | Freq: Every day | ORAL | Status: DC
  Administered 2022-12-14 – 2022-12-15 (×2): 5 mg via ORAL
  Filled 2022-12-14 (×2): qty 1

## 2022-12-14 MED ORDER — HYDROCHLOROTHIAZIDE 25 MG PO TABS
25.0000 mg | ORAL_TABLET | Freq: Every day | ORAL | Status: DC
  Administered 2022-12-15: 25 mg via ORAL
  Filled 2022-12-14: qty 1

## 2022-12-14 MED ORDER — ALBUTEROL SULFATE HFA 108 (90 BASE) MCG/ACT IN AERS: 2.0000 | INHALATION_SPRAY | RESPIRATORY_TRACT | 0 refills | Status: DC | PRN

## 2022-12-14 MED ORDER — PANTOPRAZOLE SODIUM 40 MG PO TBEC
40.0000 mg | DELAYED_RELEASE_TABLET | Freq: Every day | ORAL | Status: DC
  Administered 2022-12-14 – 2022-12-15 (×2): 40 mg via ORAL
  Filled 2022-12-14 (×2): qty 1

## 2022-12-14 MED ORDER — INSULIN ASPART 100 UNIT/ML IJ SOLN
0.0000 [IU] | Freq: Every day | INTRAMUSCULAR | Status: DC
  Administered 2022-12-14: 5 [IU] via SUBCUTANEOUS

## 2022-12-14 MED ORDER — SODIUM CHLORIDE 0.9 % IV SOLN: 250.0000 mL | INTRAVENOUS | Status: DC | PRN

## 2022-12-14 MED ORDER — ALBUTEROL SULFATE HFA 108 (90 BASE) MCG/ACT IN AERS
2.0000 | INHALATION_SPRAY | RESPIRATORY_TRACT | Status: DC | PRN
  Administered 2022-12-14: 2 via RESPIRATORY_TRACT
  Filled 2022-12-14: qty 6.7

## 2022-12-14 MED ORDER — HYDROCHLOROTHIAZIDE 25 MG PO TABS: 25.0000 mg | ORAL_TABLET | Freq: Every day | ORAL | Status: DC

## 2022-12-14 NOTE — ED Triage Notes (Addendum)
The patient has had cough and shortness of breath for two days. She denied fever. Audible wheezing in triage. Patient covid positive two weeks ago.

## 2022-12-14 NOTE — Plan of Care (Signed)
This is 89 year old female with history of COPD, hypertension who was diagnosed with COVID-19 almost 2 weeks ago presented to ED today with a complaint of shortness of breath for last couple of days.  In the ED, she was hemodynamically stable.  She was wheezy and hypoxic with exertion and was hypoxic despite of receiving bronchodilators.  Admission was requested by ED physician for further management of COPD.  And she is still positive with the COVID.  Patient has been accepted to MedSurg unit at [facility].  EDP to remain responsible for all patient care until patient arrives to the campus and then [facility] will assuume care.  Nursing staff, please reach out to patient placement for full admission.

## 2022-12-14 NOTE — ED Notes (Signed)
Fall risk armband Fall risk door sign Patient is wearing shoes

## 2022-12-14 NOTE — ED Provider Notes (Signed)
Patient signed out to me at 1500 by Dr. Wallace Cullens pending reassessment after nebs and ambulatory trial.  In short this is an 89 year old female with a past medical history of COPD and pulmonary fibrosis presenting to the emergency department with cough and shortness of breath.  Patient was found to be wheezing on arrival concerning for COPD exacerbation.  The patient had tested positive for COVID 2 weeks ago and is still positive today.  Troponin is negative, symptoms have been ongoing for several days so single troponin is sufficient. X-ray read pending at this time.  Upon my evaluation, the patient reports improvement of her shortness of breath, she is no increased work of breathing on exam though does still have significant inspiratory and expiratory wheeze on exam.  Patient will be given 2 more DuoNeb treatments prior to an ambulatory trial and will be closely reassessed.  Clinical Course as of 12/14/22 1732  Fri Dec 14, 2022  1422 SARS Coronavirus 2 by RT PCR(!): POSITIVE Positive 2 weeks ago [SG]  1527 SpO2: 95 % [SG]  1527 O2 Device: Room Air No resp distress [SG]  1704 Upon reassessment, the patient has improvement of her wheezing and notices expiratory wheeze on exam, she reports significant improvement of her cough and shortness of breath.  Patient will have an ambulatory trial. [VK]  1729 Attempted ambulatory trial however desatted to 84%, became tachycardic to the 120s and increasing tachypnea and wheezing.  She was brought back to the room with improvement of her pulse ox back to the 90s and heart rate down to the 100s.  Patient will be given additional labs and plan will be for admission for further management. Patient will also be started on azithromycin for COPD exacerbation. [VK]    Clinical Course User Index [SG] Sloan Leiter, DO [VK] Rexford Maus, DO      Rexford Maus, [place] 12/14/22 [phone]

## 2022-12-14 NOTE — ED Notes (Signed)
Pt. Has noted cough for the last week and at night she reports she wakes with cough and can't stop.  Pt. Has history of asthma.  Pt. In no distress.

## 2022-12-14 NOTE — Discharge Instructions (Addendum)
You were seen in the emergency department for your cough and your shortness of breath.  You had significant wheezing on exam and likely have an post-COVID bronchitis.  Your x-ray showed no signs of pneumonia.  We gave you steroids in the ER and I have given you additional 4 more days of his steroid course to take at home.  You should use your albuterol inhaler every 4 hours for the next 24 hours while you are awake and then can take as needed.  I have also given you Jerilynn Som he can take as needed for cough.  He can also try raw honey and sleep with a humidifier to help with your cough and congestion.  You should follow-up with your primary doctor in the next few days to have your symptoms rechecked.  You should return to the emergency department for significantly worsening shortness of breath, severe chest pain or if you have any other new or concerning symptoms.

## 2022-12-14 NOTE — H&P (Addendum)
History and Physical    Felicia Martin WJX:[id number] DOB: 16-Mar-1933 DOA: 12/14/2022  PCP: Myrlene Broker, MD   Patient coming from: Home   Chief Complaint:  Chief Complaint  Patient presents with   Cough   Shortness of Breath    HPI:  Felicia Martin is a 89 y.o. female with medical history significant of significant for asthma, essential hypertension, hyperlipidemia, GERD, non-insulin-dependent DM type II and sarcoidosis diagnosed in 1965 not currently on any steroid presented to emergency department for evaluation of worsening shortness of breath since she was diagnosed with COVID positive on 11/27/2022.  Patient initially presented to ER with complaining of shortness of breath for couple of days.  She is hemodynamically stable.  At physical exam on the ER she found to be wheezy and hypoxic with exertion and remained hypoxic despite receiving bronchodilator and steroid treatment over there.  ER physician consulted and requested for admission in the setting of asthma exacerbation.  During my evaluation, patient reported that since he has the COVID she has worsening cough and a scratchy sensation in her throat.  She also has shortness of breath and wheezing on exertion as well.  Denies any subjective fever and chills.  Denies any chest pain, palpitation, nausea, vomiting, change of smell, change of appetite, abdominal pain and diarrhea.  Denies any eye pain, eye discharge, sinus pain, sinus pressure, sinus discharge, ear pain and ear discharge.  ED Course:  At presentation heart rate 80, respiratory 20, blood pressure elevated 186/80 and O2 sat 95% on room air now. Respiratory panel positive with COVID. CBC and CMP are grossly unremarkable. Normal BNP and troponin level. EKG showed sinus rhythm heart rate 77.  There is no ST-T wave abnormality.  Chest x-ray showed chronic lung changes without any evidence of acute cardiopulmonary process.  In the patient received  albuterol nebulizer once, DuoNeb nebulizer x 2, Solu-Medrol 62.5 mg once and azithromycin IV 500 mg once.  Patient has been accepted in transfer to [facility] long hospital at [facility] unit.   Review of Systems:  Review of Systems  Constitutional:  Negative for chills, fever and weight loss.  HENT:  Positive for hearing loss. Negative for congestion, ear discharge, ear pain, sinus pain, sore throat and tinnitus.   Respiratory:  Positive for cough, shortness of breath and wheezing. Negative for hemoptysis, sputum production and stridor.   Cardiovascular:  Negative for chest pain, palpitations, orthopnea and leg swelling.  Gastrointestinal:  Negative for diarrhea, heartburn, nausea and vomiting.  Musculoskeletal:  Negative for joint pain and myalgias.  Skin:  Negative for itching and rash.  Neurological:  Negative for dizziness and headaches.  Psychiatric/Behavioral:  The patient is not nervous/anxious.     Past Medical History:  Diagnosis Date   Asthma    GERD (gastroesophageal reflux disease)    Hypercholesterolemia    Hypertension    Pulmonary fibrosis (HCC)    Sarcoidosis    Surgical history of tubal ligation 1960s    Past Surgical History:  Procedure Laterality Date   COLONOSCOPY  12/09/14   ESOPHAGOGASTRODUODENOSCOPY  12/09/14   LUNG SURGERY  1960   Right lung   Maloney dilation  12/09/14   TUBAL LIGATION  1960     reports that she quit smoking about 23 years ago. Her smoking use included cigarettes. She started smoking about 63 years ago. She has a 20 pack-year smoking history. She has never used smokeless tobacco. She reports that she does not drink alcohol and does  not use drugs.  Allergies  Allergen Reactions   Fenofibrate Rash   Statins Other (See Comments)    Muscle aches and weakness   Zetia [Ezetimibe] Other (See Comments)    Muscle pain   Farxiga [Dapagliflozin] Swelling    Patient didn't like increase in urinary frequency and reported swelling    Family  History  Problem Relation Age of Onset   Diabetes Sister        x's 3   Alzheimer's disease Sister    Liver cancer Mother    Liver cancer Father    Cancer Father    Cancer Brother    Diabetes Brother    Hypertension Sister        x's 3   Hyperlipidemia Other    Breast cancer Neg Hx     Prior to Admission medications   Medication Sig Start Date End Date Taking? Authorizing Provider  Pitavastatin Calcium 1 MG TABS Take 1 tablet (1 mg total) by mouth daily. 12/13/22   Myrlene Broker, MD  albuterol (VENTOLIN HFA) 108 (90 Base) MCG/ACT inhaler Inhale 2 puffs into the lungs every 6 (six) hours as needed. 12/04/22   Myrlene Broker, MD  amLODipine (NORVASC) 5 MG tablet Take 1 tablet (5 mg total) by mouth daily. 12/04/22   Myrlene Broker, MD  Blood Glucose Monitoring Suppl [facility] VERIO) w/Device KIT Use as directed once a day.  Dx code: E11.9 06/10/18   Zola Button, Grayling Congress, DO  cetirizine (ZYRTEC) 10 MG tablet Take 1 tablet (10 mg total) by mouth daily. 06/29/22   Henson, Vickie L, NP-C  fluticasone (FLOVENT HFA) 110 MCG/ACT inhaler Inhale 2 puffs into the lungs 2 (two) times daily. 12/04/22   Myrlene Broker, MD  glucose blood [facility] VERIO) test strip USE TO CHECK BLOOD GLUCOSE ONCE DAILY 06/18/22   Zola Button, Grayling Congress, DO  hydrochlorothiazide (HYDRODIURIL) 25 MG tablet Take 1 tablet (25 mg total) by mouth daily. 12/04/22   Myrlene Broker, MD  Lancets [facility] DELICA PLUS Collier Bullock) MISC USE ONCE A DAY.  DX CODE: E11.9 01/09/22   Seabron Spates R, DO  Metamucil Fiber CHEW Chew 1 each by mouth daily.    [provider]  metFORMIN (GLUCOPHAGE-XR) 500 MG 24 hr tablet Take 1 tablet (500 mg total) by mouth daily with breakfast. 12/13/22   Myrlene Broker, MD     Physical Exam: Vitals:   12/14/22 1320 12/14/22 1324 12/14/22 1500 12/14/22 1900  BP:  (!) 186/80 (!) 175/71 (!) 145/82  Pulse:  80 80 (!) 109  Resp:  20 (!) 21 (!) 21  Temp:   98.3 F (36.8 C) 98.3 F (36.8 C)   TempSrc:  Oral    SpO2:  95% 97% 95%  Weight: 89.4 kg     Height: 5' (1.524 m)       Physical Exam Constitutional:      General: She is not in acute distress.    Appearance: She is not ill-appearing.  Eyes:     Pupils: Pupils are equal, round, and reactive to light.  Cardiovascular:     Rate and Rhythm: Normal rate and regular rhythm.     Pulses: Normal pulses.     Heart sounds: No murmur heard. Pulmonary:     Effort: Pulmonary effort is normal. No accessory muscle usage or respiratory distress.     Breath sounds: No stridor. Examination of the right-upper field reveals wheezing. Examination of the left-upper field reveals  wheezing. Examination of the right-middle field reveals wheezing. Examination of the left-middle field reveals wheezing. Wheezing present. No decreased breath sounds, rhonchi or rales.  Chest:     Chest wall: No tenderness.  Musculoskeletal:     Cervical back: Neck supple.     Right lower leg: No edema.     Left lower leg: No edema.  Lymphadenopathy:     Cervical: No cervical adenopathy.  Skin:    Capillary Refill: Capillary refill takes less than 2 seconds.     Coloration: Skin is not cyanotic or pale.     Findings: No rash.  Neurological:     Mental Status: She is oriented to person, place, and time.      Labs on Admission: I have personally reviewed following labs and imaging studies  CBC: Recent Labs  Lab 12/14/22 1420  WBC 5.5  NEUTROABS 2.9  HGB 12.1  HCT 36.3  MCV 92.4  PLT 297   Basic Metabolic Panel: Recent Labs  Lab 12/14/22 1420  NA 139  K 3.7  CL 105  CO2 25  GLUCOSE 124*  BUN 19  CREATININE 0.80  CALCIUM 9.3   GFR: Estimated Creatinine Clearance: 49.4 mL/min (by C-G formula based on SCr of 0.8 mg/dL). Liver Function Tests: Recent Labs  Lab 12/14/22 1420  AST 18  ALT 10  ALKPHOS 62  BILITOT 0.7  PROT 8.6*  ALBUMIN 3.8   No results for input(s): "LIPASE", "AMYLASE" in the  last 168 hours. No results for input(s): "AMMONIA" in the last 168 hours. Coagulation Profile: No results for input(s): "INR", "PROTIME" in the last 168 hours. Cardiac Enzymes: Recent Labs  Lab 12/14/22 1420  TROPONINIHS 3   BNP (last 3 results) Recent Labs    12/14/22 1420  BNP 41.3   HbA1C: No results for input(s): "HGBA1C" in the last 72 hours. CBG: No results for input(s): "GLUCAP" in the last 168 hours. Lipid Profile: No results for input(s): "CHOL", "HDL", "LDLCALC", "TRIG", "CHOLHDL", "LDLDIRECT" in the last 72 hours. Thyroid Function Tests: No results for input(s): "TSH", "T4TOTAL", "FREET4", "T3FREE", "THYROIDAB" in the last 72 hours. Anemia Panel: No results for input(s): "VITAMINB12", "FOLATE", "FERRITIN", "TIBC", "IRON", "RETICCTPCT" in the last 72 hours. Urine analysis:    Component Value Date/Time   COLORURINE STRAW (A) 03/22/2022 1254   APPEARANCEUR CLEAR 03/22/2022 1254   LABSPEC 1.010 03/22/2022 1254   PHURINE 6.0 03/22/2022 1254   GLUCOSEU NEGATIVE 03/22/2022 1254   HGBUR NEGATIVE 03/22/2022 1254   HGBUR negative 05/04/2010 1501   BILIRUBINUR NEGATIVE 03/22/2022 1254   BILIRUBINUR neg 06/02/2018 1039   KETONESUR NEGATIVE 03/22/2022 1254   PROTEINUR NEGATIVE 03/22/2022 1254   UROBILINOGEN 0.2 06/02/2018 1039   UROBILINOGEN 0.2 05/04/2010 1501   NITRITE NEGATIVE 03/22/2022 1254   LEUKOCYTESUR NEGATIVE 03/22/2022 1254    Radiological Exams on Admission: I have personally reviewed images DG Chest 2 View  Result Date: 12/14/2022 CLINICAL DATA:  Shortness of breath and cough. Wheezing. COVID positive 2 weeks ago. EXAM: CHEST - 2 VIEW COMPARISON:  Chest radiographs 08/09/2017 and CT 03/22/2022 FINDINGS: The cardiomediastinal silhouette is unchanged with normal heart size. Chronic interstitial densities/fibrotic changes are again seen in the right upper lobe with associated volume loss and bronchiectasis, similar to the prior studies. Milder interstitial  densities are again seen in the lung bases. No acute airspace consolidation, pleural effusion, or pneumothorax is identified. No acute osseous abnormality is seen. IMPRESSION: Chronic lung changes without evidence of acute cardiopulmonary process. Electronically Signed  By: Sebastian Ache M.D.   On: 12/14/2022 15:58    EKG: My personal interpretation of EKG shows: EKG showed normal sinus rhythm heart rate 77.  No ST and T wave abnormality.     Assessment/Plan: Active Problems:   Asthma, chronic, unspecified asthma severity, with acute exacerbation   COVID-19 virus infection   Essential hypertension   Hyperlipidemia   Non-insulin dependent type 2 diabetes mellitus (HCC)   GERD (gastroesophageal reflux disease)   History of sarcoidosis    Assessment and Plan: Asthma exacerbation Recent COVID-19 infection on 11/24/2022 -Patient presenting with worsening shortness and cough in the setting of COVID-19 infection 3 weeks ago.  At home patient using fluticasone inhaler bid and Ventolin prn without any symptom improvement. -CBC and CMP grossly unremarkable. -Patient is hemodynamically stable and afebrile.  Currently O2 sat 95% on room air. -X-ray unremarkable, no evidence of pneumonia. -Respiratory panel positive with COVID. - In the ER patient continues to have wheezing and desaturated on ambulation even with multiple treatment with DuoNeb nebulizer and 1 dose of steroid transferred to hospital for admission. -Patient is out of window to treat with Paxlovid.  Plan to continue treatment for asthma exacerbation and supportive care. -Continue Xopenex nebulizer every 6 hours scheduled and albuterol as needed for wheezing or shortness of breath - Continue Solu-Medrol 80 mg twice daily.  Continue Protonix as treating with high-dose of steroid. - Continue azithromycin 500 mg IV daily for 4 days. - Continue Mucinex, flutter valve, spirometry and supportive care. - Current to aspiration precaution. -  Continue supplemental oxygen and wean down at room air as patient tolerates. -On discharge patient will benefit for LABA prescription.  Essential hypertension -Amlodipine 5 mg daily and hydrochlorothiazide 25 mg daily. -Continue hydralazine 5 mg every 8 hrs prn for systolic blood pressure above 161.  Hyperlipidemia -Continue pravastatin 20 mg daily.  Non-insulin-dependent DM type II -A1c 6.7 in 12/04/22 -Holding metformin in the hospital setting - Continue sliding scale and at bedtime insulin as needed as treating with high-dose of steroid as well as patient is diabetic.  History of sarcoidosis-sable Patient reported history of sarcoidosis 1965 not currently on any maintenance steroid at home.   DVT prophylaxis:  Lovenox and scd Code Status:  Full Code.  Verified with patient Diet: Heart healthy carb modified diet Family Communication: Discussed treatment plan with patient and she was talking with her daughter over the phone and updating her simultaneously Disposition Plan: Tentative discharge to home next 1 to 2 days. Consults:  none Admission status:   Inpatient, Med-Surg  Severity of Illness: The appropriate patient status for this patient is INPATIENT. Inpatient status is judged to be reasonable and necessary in order to provide the required intensity of service to ensure the patient's safety. The patient's presenting symptoms, physical exam findings, and initial radiographic and laboratory data in the context of their chronic comorbidities is felt to place them at high risk for further clinical deterioration. Furthermore, it is not anticipated that the patient will be medically stable for discharge from the hospital within 2 midnights of admission.   * I certify that at the point of admission it is my clinical judgment that the patient will require inpatient hospital care spanning beyond 2 midnights from the point of admission due to high intensity of service, high risk for further  deterioration and high frequency of surveillance required.Marland Kitchen    Tereasa Coop, MD [facility]  How to contact the [facility] Attending or Consulting provider 7A -  7P or covering provider during after hours 7P -7A, for this patient.  Check the care team in [facility] and look for a) attending/consulting [facility] provider listed and b) the [facility] team listed Log into [email or web address] and use [place]'s universal password to access. If you do not have the password, please contact the hospital operator. Locate the [facility] provider you are looking for under [facility] and page to a number that you can be directly reached. If you still have difficulty reaching the provider, please page the [facility] (Director on Call) for the Hospitalists listed on amion for assistance.  12/14/2022, 8:29 PM

## 2022-12-14 NOTE — ED Notes (Signed)
RT had pt ambulate with pulse ox attached. Pt's O2 level decreased to 84% during the ambulation with her HR increasing to the 120's. MD was notified.

## 2022-12-14 NOTE — ED Provider Notes (Signed)
[facility] Provider Note  CSN: [id number] Arrival date & time: 12/14/22 1313  Chief Complaint(s) Cough and Shortness of Breath  HPI Felicia Martin is Martin 89 y.o. female with Martin PMH of sarcoidosis, pulmonary fibrosis, pulmonary HTN, COPD, 10 pack year history of tobacco use and recent Covid infection c/o increased cough that started 2 days ago. The cough is intermittent, worse at night, and feeling like she has sputum but cannot bring it up. Denies any fevers, chills, chest pain, palpitations. Denies any BLE edema, orthopnea. Her daughter got some scented products at home and it was making her coughing worse. Denies any recent medications, swelling of the mouth or lips or rashes.  Past Medical History Past Medical History:  Diagnosis Date   Asthma    GERD (gastroesophageal reflux disease)    Hypercholesterolemia    Hypertension    Pulmonary fibrosis (HCC)    Sarcoidosis    Surgical history of tubal ligation 1960s   Patient Active Problem List   Diagnosis Date Noted   Statin myopathy 12/04/2022   Morbid obesity (HCC) 05/25/2022   Pulmonary hypertension (HCC) 05/25/2022   Diabetic polyneuropathy associated with type 2 diabetes mellitus (HCC) 09/20/2020   DDD (degenerative disc disease), thoracolumbar 10/06/2019   Hyperlipidemia associated with type 2 diabetes mellitus (HCC) 10/06/2019   Diabetes mellitus type 2 with complications (HCC) 06/02/2018   Primary hypertension 03/05/2014   [facility] 12/07/2013   Nonspecific abnormal electrocardiogram (ECG) (EKG) 12/07/2013   PULMONARY FIBROSIS 02/01/2007   SARCOIDOSIS 11/20/2006   Mild intermittent asthma 11/20/2006   Home Medication(s) Prior to Admission medications   Medication Sig Start Date End Date Taking? Authorizing Provider  Pitavastatin Calcium 1 MG TABS Take 1 tablet (1 mg total) by mouth daily. 12/13/22   Felicia Broker,  Felicia Martin  albuterol (VENTOLIN HFA) 108 (90 Base) MCG/ACT inhaler Inhale 2 puffs into the lungs every 6 (six) hours as needed. 12/04/22   Felicia Broker, Felicia Martin  amLODipine (NORVASC) 5 MG tablet Take 1 tablet (5 mg total) by mouth daily. 12/04/22   Felicia Broker, Felicia Martin  Blood Glucose Monitoring [facility] VERIO) w/Device KIT Use as directed once Martin day.  Dx code: E11.9 06/10/18   Felicia Martin, Felicia Congress, Felicia Martin  cetirizine (ZYRTEC) 10 MG tablet Take 1 tablet (10 mg total) by mouth daily. 06/29/22   Felicia Martin, Felicia Martin, Felicia Martin  fluticasone (FLOVENT HFA) 110 MCG/ACT inhaler Inhale 2 puffs into the lungs 2 (two) times daily. 12/04/22   Felicia Broker, Felicia Martin  glucose blood [facility] VERIO) test strip USE TO CHECK BLOOD GLUCOSE ONCE DAILY 06/18/22   Felicia Martin, Felicia Congress, Felicia Martin  hydrochlorothiazide (HYDRODIURIL) 25 MG tablet Take 1 tablet (25 mg total) by mouth daily. 12/04/22   Felicia Broker, Felicia Martin  Lancets [facility] DELICA PLUS [place] Bullock) MISC USE ONCE Martin DAY.  DX CODE: E11.9 01/09/22   Felicia Spates Martin, Felicia Martin  Metamucil Fiber CHEW Chew 1 each by mouth daily.    Provider, Historical, Felicia Martin  metFORMIN (GLUCOPHAGE-XR) 500 MG 24 hr tablet Take 1 tablet (500 mg total) by mouth daily with breakfast. 12/13/22   Felicia Broker, Felicia Martin  Past Surgical History Past Surgical History:  Procedure Laterality Date   COLONOSCOPY  12/09/14   ESOPHAGOGASTRODUODENOSCOPY  12/09/14   LUNG SURGERY  1960   Right lung   Maloney dilation  12/09/14   TUBAL LIGATION  1960   Family History Family History  Problem Relation Age of Onset   Diabetes Sister        x's 3   Alzheimer's disease Sister    Liver cancer Mother    Liver cancer Father    Cancer Father    Cancer Brother    Diabetes Brother    Hypertension Sister        x's 3   Hyperlipidemia Other    Breast cancer Neg Hx     Social  History Social History   Tobacco Use   Smoking status: Former    Current packs/day: 0.00    Average packs/day: 0.5 packs/day for 40.0 years (20.0 ttl pk-yrs)    Types: Cigarettes    Start date: 05/25/1959    Quit date: 05/25/1999    Years since quitting: 23.5   Smokeless tobacco: Never  Substance Use Topics   Alcohol use: No   Drug use: No   Allergies Fenofibrate, Statins, Zetia [ezetimibe], and Farxiga [dapagliflozin]  Review of Systems Negative unless otherwise stated.   Physical Exam Vital Signs  I have reviewed the triage vital signs BP (!) 186/80 (BP Location: Left Arm)   Pulse 80   Temp 98.3 F (36.8 C) (Oral)   Resp 20   Ht 5' (1.524 m)   Wt 89.4 kg   LMP 03/17/2014   SpO2 95%   BMI 38.47 kg/m  Physical Exam Constitutional:      General: She is not in acute distress.    Appearance: She is not ill-appearing or toxic-appearing.  Cardiovascular:     Rate and Rhythm: Normal rate and regular rhythm.     Heart sounds: Heart sounds not distant. No murmur heard.    No friction rub. No gallop.  Pulmonary:     Breath sounds: Examination of the right-upper field reveals wheezing. Examination of the left-upper field reveals wheezing. Examination of the right-middle field reveals wheezing. Examination of the left-middle field reveals wheezing. Examination of the right-lower field reveals wheezing. Examination of the left-lower field reveals wheezing. Decreased breath sounds and wheezing present. No rhonchi or rales.  Abdominal:     General: Bowel sounds are normal.     Palpations: Abdomen is soft. There is no hepatomegaly, splenomegaly or mass.  Musculoskeletal:     Right lower leg: No edema.     Left lower leg: No edema.  Skin:    General: Skin is warm.     Capillary Refill: Capillary refill takes less than 2 seconds.  Neurological:     Mental Status: She is alert.    ED Results and Treatments Labs (all labs ordered are listed, but only abnormal results are  displayed) Labs Reviewed  RESP PANEL BY RT-PCR (RSV, FLU Martin&B, COVID)  RVPGX2 - Abnormal; Notable for the following components:      Result Value   SARS Coronavirus 2 by RT PCR POSITIVE (*)    All other components within normal limits  COMPREHENSIVE METABOLIC PANEL - Abnormal; Notable for the following components:   Glucose, Bld 124 (*)    Total Protein 8.6 (*)    All other components within normal limits  CBC WITH DIFFERENTIAL/PLATELET  BRAIN NATRIURETIC PEPTIDE  TROPONIN I (HIGH SENSITIVITY)  TROPONIN I (HIGH SENSITIVITY)  Radiology No results found.  Pertinent labs & imaging results that were available during my care of the patient were reviewed by me and considered in my medical decision making (see MDM for details).  Medications Ordered in ED Medications  albuterol (VENTOLIN HFA) 108 (90 Base) MCG/ACT inhaler 2 puff (2 puffs Inhalation Given 12/14/22 1358)  ipratropium-albuterol (DUONEB) 0.5-2.5 (3) MG/3ML nebulizer solution 3 mL (3 mLs Nebulization Given 12/14/22 1513)  methylPREDNISolone sodium succinate (SOLU-MEDROL) 125 mg/2 mL injection 62.5 mg (62.5 mg Intravenous Given 12/14/22 1453)                                                                          Medical Decision Making / ED Course    Medical Decision Making:    ALVIRA HECHT is Martin 89 y.o. female with Martin PMH of sarcoidosis, pulmonary fibrosis, pulmonary HTN, COPD, 10 pack year history of tobacco use and recent Covid infection c/o increased cough that started 2 days ago.  The complaint involves an extensive differential diagnosis and also carries with it Martin high risk of complications and morbidity.  Serious etiology was considered. Ddx includes but is not limited to: COPD exacerbation, residual dyspnea 2/2 covid infection, PNA, CHF  Complete initial physical exam performed, notably the patient   was wheezing and dyspneic  Reviewed and confirmed nursing documentation for past medical history, family history, social history.  Vital signs reviewed.    Clinical Course as of 12/14/22 1517  Fri Dec 14, 2022  1422 SARS Coronavirus 2 by RT PCR(!): POSITIVE Positive 2 weeks ago [SG]    Clinical Course User Index [SG] Felicia Leiter, Felicia Martin    Additional history obtained: -External records from outside source obtained and reviewed including: Chart review including previous notes, labs, imaging, consultation notes including   Pulmonary function tests and pulmonology notes.    Lab Tests: -I ordered, reviewed, and interpreted labs.   The pertinent results include:   Labs Reviewed  RESP PANEL BY RT-PCR (RSV, FLU Martin&B, COVID)  RVPGX2 - Abnormal; Notable for the following components:      Result Value   SARS Coronavirus 2 by RT PCR POSITIVE (*)    All other components within normal limits  COMPREHENSIVE METABOLIC PANEL - Abnormal; Notable for the following components:   Glucose, Bld 124 (*)    Total Protein 8.6 (*)    All other components within normal limits  CBC WITH DIFFERENTIAL/PLATELET  BRAIN NATRIURETIC PEPTIDE  TROPONIN I (HIGH SENSITIVITY)  TROPONIN I (HIGH SENSITIVITY)    Notable for WBC WNL, no electrolyte abnormalities BNP 41  EKG NSR with non specific T wave abnormalities.   Imaging Studies ordered: I ordered imaging studies including CXR I independently visualized the following imaging with scope of interpretation limited to determining acute life threatening conditions related to emergency care; findings noted above, significant for hilar LAD cw sarcoidosis, but no pulmonary edema, blunting of the costophrenic angles, emphysema.  I independently visualized and interpreted imaging. Radiologist interpretation is pending.   Medicines ordered and prescription drug management: Meds ordered this encounter  Medications   albuterol (VENTOLIN HFA) 108 (90 Base) MCG/ACT  inhaler 2 puff   ipratropium-albuterol (DUONEB) 0.5-2.5 (3) MG/3ML nebulizer solution 3 mL  methylPREDNISolone sodium succinate (SOLU-MEDROL) 125 mg/2 mL injection 62.5 mg    -I have reviewed the patients home medicines and have made adjustments as needed  Reevaluation: After the interventions noted above, I reevaluated the patient and found that they have improved  Co morbidities that complicate the patient evaluation  Past Medical History:  Diagnosis Date   Asthma    GERD (gastroesophageal reflux disease)    Hypercholesterolemia    Hypertension    Pulmonary fibrosis (HCC)    Sarcoidosis    Surgical history of tubal ligation 1960s    Patient most likely has Martin COPD exacerbation after her COVID infection. She is only on Martin SABA and ICS which have not helped. Duoneb and solumedrol given. Case signed out to Dr. Theresia Lo.    Felicia Rowan [place], Felicia Martin 12/14/22 1522    Felicia Rockers Martin, Felicia Martin 12/19/22 0740

## 2022-12-15 DIAGNOSIS — J45901 Unspecified asthma with (acute) exacerbation: Secondary | ICD-10-CM | POA: Diagnosis not present

## 2022-12-15 LAB — CBC
HCT: 36.6 % (ref 36.0–46.0)
Hemoglobin: 11.9 g/dL — ABNORMAL LOW (ref 12.0–15.0)
MCH: 30.7 pg (ref 26.0–34.0)
MCHC: 32.5 g/dL (ref 30.0–36.0)
MCV: 94.6 fL (ref 80.0–100.0)
Platelets: 298 10*3/uL (ref 150–400)
RBC: 3.87 MIL/uL (ref 3.87–5.11)
RDW: 12.4 % (ref 11.5–15.5)
WBC: 4.6 10*3/uL (ref 4.0–10.5)
nRBC: 0 % (ref 0.0–0.2)

## 2022-12-15 LAB — COMPREHENSIVE METABOLIC PANEL
ALT: 10 U/L (ref 0–44)
AST: 15 U/L (ref 15–41)
Albumin: 3.6 g/dL (ref 3.5–5.0)
Alkaline Phosphatase: 60 U/L (ref 38–126)
Anion gap: 9 (ref 5–15)
BUN: 15 mg/dL (ref 8–23)
CO2: 23 mmol/L (ref 22–32)
Calcium: 9.2 mg/dL (ref 8.9–10.3)
Chloride: 103 mmol/L (ref 98–111)
Creatinine, Ser: 0.71 mg/dL (ref 0.44–1.00)
GFR, Estimated: >60 mL/min (ref >60)
Glucose, Bld: 209 mg/dL — ABNORMAL HIGH (ref 70–99)
Potassium: 3.8 mmol/L (ref 3.5–5.1)
Sodium: 135 mmol/L (ref 135–145)
Total Bilirubin: 0.6 mg/dL (ref 0.3–1.2)
Total Protein: 8.1 g/dL (ref 6.5–8.1)

## 2022-12-15 LAB — GLUCOSE, CAPILLARY
Glucose-Capillary: 228 mg/dL — ABNORMAL HIGH (ref 70–99)
Glucose-Capillary: 287 mg/dL — ABNORMAL HIGH (ref 70–99)

## 2022-12-15 MED ORDER — PREDNISONE 10 MG PO TABS: 40.0000 mg | ORAL_TABLET | Freq: Every day | ORAL | 0 refills | Status: AC

## 2022-12-15 MED ORDER — ALBUTEROL SULFATE (2.5 MG/3ML) 0.083% IN NEBU: 2.5000 mg | INHALATION_SOLUTION | RESPIRATORY_TRACT | 12 refills | Status: AC | PRN

## 2022-12-15 MED ORDER — GUAIFENESIN ER 600 MG PO TB12: 1200.0000 mg | ORAL_TABLET | Freq: Two times a day (BID) | ORAL | 0 refills | Status: DC

## 2022-12-15 NOTE — Plan of Care (Signed)
  Problem: Education: Goal: Knowledge of General Education information will improve Description: Including pain rating scale, medication(s)/side effects and non-pharmacologic comfort measures Outcome: Progressing   Problem: Health Behavior/Discharge Planning: Goal: Ability to manage health-related needs will improve Outcome: Progressing   Problem: Clinical Measurements: Goal: Ability to maintain clinical measurements within normal limits will improve Outcome: Progressing Goal: Will remain free from infection Outcome: Progressing Goal: Diagnostic test results will improve Outcome: Progressing Goal: Respiratory complications will improve Outcome: Progressing Goal: Cardiovascular complication will be avoided Outcome: Progressing   Problem: Activity: Goal: Risk for activity intolerance will decrease Outcome: Progressing   Problem: Nutrition: Goal: Adequate nutrition will be maintained Outcome: Progressing   Problem: Coping: Goal: Level of anxiety will decrease Outcome: Progressing   Problem: Elimination: Goal: Will not experience complications related to bowel motility Outcome: Progressing Goal: Will not experience complications related to urinary retention Outcome: Progressing   Problem: Pain Managment: Goal: General experience of comfort will improve Outcome: Progressing   Problem: Safety: Goal: Ability to remain free from injury will improve Outcome: Progressing   Problem: Skin Integrity: Goal: Risk for impaired skin integrity will decrease Outcome: Progressing   Problem: Education: Goal: Knowledge of disease or condition will improve Outcome: Progressing Goal: Knowledge of the prescribed therapeutic regimen will improve Outcome: Progressing Goal: Individualized Educational Video(s) Outcome: Progressing   Problem: Activity: Goal: Ability to tolerate increased activity will improve Outcome: Progressing Goal: Will verbalize the importance of balancing  activity with adequate rest periods Outcome: Progressing   Problem: Respiratory: Goal: Ability to maintain a clear airway will improve Outcome: Progressing Goal: Levels of oxygenation will improve Outcome: Progressing Goal: Ability to maintain adequate ventilation will improve Outcome: Progressing   Problem: Education: Goal: Ability to describe self-care measures that may prevent or decrease complications (Diabetes Survival Skills Education) will improve Outcome: Progressing Goal: Individualized Educational Video(s) Outcome: Progressing   Problem: Coping: Goal: Ability to adjust to condition or change in health will improve Outcome: Progressing   Problem: Fluid Volume: Goal: Ability to maintain a balanced intake and output will improve Outcome: Progressing   Problem: Health Behavior/Discharge Planning: Goal: Ability to identify and utilize available resources and services will improve Outcome: Progressing Goal: Ability to manage health-related needs will improve Outcome: Progressing   Problem: Metabolic: Goal: Ability to maintain appropriate glucose levels will improve Outcome: Progressing   Problem: Nutritional: Goal: Maintenance of adequate nutrition will improve Outcome: Progressing Goal: Progress toward achieving an optimal weight will improve Outcome: Progressing   Problem: Skin Integrity: Goal: Risk for impaired skin integrity will decrease Outcome: Progressing   Problem: Tissue Perfusion: Goal: Adequacy of tissue perfusion will improve Outcome: Progressing

## 2022-12-15 NOTE — Hospital Course (Signed)
89yo with h/o asthma, HTN, HLD, DM, and sarcoidosis who presented on 9/27 with persistent cough and SOB following COVID-19 infection on 9/10.  She is being treated for asthma exacerbation.

## 2022-12-15 NOTE — Progress Notes (Signed)
Discharge instructions reviewed with patient. Nebulizer delivered to room. Awaiting daughter to pick up patient.

## 2022-12-15 NOTE — TOC Transition Note (Signed)
Transition of Care [facility]) - CM/SW Discharge Note   Patient Details  Name: Felicia Martin MRN: [id number] Date of Birth: Aug 28, 1932  Transition of Care [facility]) CM/SW Contact:  Carmina Miller, LCSWA Phone Number: 12/15/2022, 11:36 AM   Clinical Narrative:     CSW spoke with Jermaine at [place], nebulizer ordered, will be delivered to the room with the next hour or two.          Patient Goals and CMS Choice      Discharge Placement                         Discharge Plan and Services Additional resources added to the After Visit Summary for                                       Social Determinants of Health (SDOH) Interventions SDOH Screenings   Food Insecurity: No Food Insecurity (12/14/2022)  Housing: Low Risk  (12/14/2022)  Transportation Needs: No Transportation Needs (12/14/2022)  Utilities: Not At Risk (12/14/2022)  Alcohol Screen: Low Risk  (11/23/2021)  Depression (PHQ2-9): Low Risk  (12/04/2022)  Financial Resource Strain: Low Risk  (11/23/2021)  Physical Activity: Inactive (11/23/2021)  Social Connections: Socially Isolated (11/23/2021)  Stress: No Stress Concern Present (11/10/2020)  Tobacco Use: Medium Risk (12/14/2022)     Readmission Risk Interventions     No data to display

## 2022-12-15 NOTE — Discharge Summary (Addendum)
Physician Discharge Summary   Patient: Felicia Martin MRN: [id number] DOB: 12-Nov-1932  Admit date:     12/14/2022  Discharge date: 12/15/22  Discharge Physician: Jonah Blue   PCP: Myrlene Broker, MD   Recommendations at discharge:   Asthma exacerbation related to recent COVID infection - continue prednisone for 5 total days Will provide nebulizer treatments with albuterol for use as needed at home Follow up with Dr. Okey Dupre in 1-2 weeks; discuss need to restart hydrochlorothiazide and pravastatin as well as goals of care  Discharge Diagnoses: Principal Problem:   Asthma, chronic, unspecified asthma severity, with acute exacerbation Active Problems:   COVID-19 virus infection   Essential hypertension   Hyperlipidemia   Non-insulin dependent type 2 diabetes mellitus (HCC)   History of sarcoidosis    Hospital Course: 89yo with h/o asthma, HTN, HLD, DM, and sarcoidosis who presented on 9/27 with persistent cough and SOB following COVID-19 infection on 9/10.  She is being treated for asthma exacerbation.  Assessment and Plan:  Asthma exacerbation -She has history of mostly seasonal asthma and presented with persistent coughing and SOB -She had COVID recently, still testing positive -Currently mostly asymptomatic with excellent O2 sats on RA -Will dc to home today -Will hold further azithromycin as this is not generally indicated for asthma exacerbation -Continue Albuterol nebulizer treatments as needed at home -Solu-Medrol 80 mg -> prednisone   Recent COVID-19 infection -Reports having symptoms about 2 weeks ago -Still testing positive -She is out of the treatment window    HTN -Not currently taking HCTZ  -Continue amlodipine   HLD -Not currently taking pravastatin  -Recent LDL was 210, clearly needs this medication   DM -Recent A1c was 6.7 -Resume metformin    Sarcoidosis -She does not appear to be taking medications for this issue at this time      Class 2 obesity -Body mass index is 38.88 kg/m..  -Weight loss should be encouraged -Outpatient PCP/bariatric medicine f/u encouraged            Consultants: None   Procedures: None   Antibiotics: Azithromycin x 1 dose      Disposition: Home Diet recommendation:  Carb modified diet DISCHARGE MEDICATION: Allergies as of 12/15/2022       Reactions   Fenofibrate Rash   Statins Other (See Comments)   Muscle aches and weakness   Zetia [ezetimibe] Other (See Comments)   Muscle pain   Farxiga [dapagliflozin] Swelling   Patient didn't like increase in urinary frequency and reported swelling        Medication List     STOP taking these medications    cetirizine 10 MG tablet Commonly known as: ZYRTEC   hydrochlorothiazide 25 MG tablet Commonly known as: HYDRODIURIL   Pitavastatin Calcium 1 MG Tabs       TAKE these medications    albuterol 108 (90 Base) MCG/ACT inhaler Commonly known as: VENTOLIN HFA Inhale 2 puffs into the lungs every 6 (six) hours as needed. What changed: reasons to take this   albuterol (2.5 MG/3ML) 0.083% nebulizer solution Commonly known as: PROVENTIL Take 3 mLs (2.5 mg total) by nebulization every 2 (two) hours as needed for wheezing or shortness of breath. What changed: You were already taking a medication with the same name, and this prescription was added. Make sure you understand how and when to take each.   Alka-Seltzer Plus Cold 2-7.8-325 MG Tbef Generic drug: Chlorphen-Phenyleph-ASA Take 1-2 tablets by mouth every 6 (six) hours as needed (  for cold or flu-like symptoms and dissolve in water as directed).   amLODipine 5 MG tablet Commonly known as: NORVASC Take 1 tablet (5 mg total) by mouth daily.   fluticasone 110 MCG/ACT inhaler Commonly known as: Flovent HFA Inhale 2 puffs into the lungs 2 (two) times daily.   guaiFENesin 600 MG 12 hr tablet Commonly known as: MUCINEX Take 2 tablets (1,200 mg total) by mouth 2  (two) times daily.   Metamucil Fiber Chew Chew 1 tablet by mouth daily as needed (for constipation).   metFORMIN 500 MG 24 hr tablet Commonly known as: GLUCOPHAGE-XR Take 1 tablet (500 mg total) by mouth daily with breakfast.   OneTouch Delica Plus Lancet33G Misc USE ONCE A DAY.  DX CODE: E11.9   OneTouch Verio test strip Generic drug: glucose blood USE TO CHECK BLOOD GLUCOSE ONCE DAILY   OneTouch Verio w/Device Kit Use as directed once a day.  Dx code: E11.9   predniSONE 10 MG tablet Commonly known as: DELTASONE Take 4 tablets (40 mg total) by mouth daily for 5 days.        Follow-up Information     Myrlene Broker, MD. Call in 3 days.   Specialty: Internal Medicine Contact information: [address] [place] [address] [phone]                Discharge Exam: Ceasar Mons Weights   12/14/22 1320 12/15/22 0133  Weight: 89.4 kg 90.3 kg     Subjective: She is feeling well, was able to get up and moving today.  She did notice some wheezing upon returning to bed but no SOB.   Objective: Vitals:   12/15/22 0912 12/15/22 1029  BP:  (!) 142/67  Pulse:  95  Resp:  19  Temp:  98.1 F (36.7 C)  SpO2: 97% 96%    Intake/Output Summary (Last 24 hours) at 12/15/2022 1125 Last data filed at 12/15/2022 0913 Gross per 24 hour  Intake 640 ml  Output 1300 ml  Net -660 ml   Filed Weights   12/14/22 1320 12/15/22 0133  Weight: 89.4 kg 90.3 kg    Exam:  General:  Appears calm and comfortable and is in NAD, 96% on RA, very conversant without dyspnea Eyes:   EOMI, normal lids, iris ENT:  grossly normal hearing, lips & tongue, mmm Neck:  no LAD, masses or thyromegaly Cardiovascular:  RRR, no m/r/g. No LE edema.  Respiratory:   CTA bilaterally with no wheezes/rales/rhonchi.  Normal respiratory effort. Abdomen:  soft, NT, ND Skin:  no rash or induration seen on limited exam Musculoskeletal:  grossly normal tone BUE/BLE, good ROM, no bony  abnormality Psychiatric:  grossly normal mood and affect, speech fluent and appropriate, AOx3 Neurologic:  CN 2-12 grossly intact, moves all extremities in coordinated fashion, sensation intact  Data Reviewed: I have reviewed the patient's lab results since admission.  Pertinent labs for today include:  None today, stable 9/27     Condition at discharge: improving  The results of significant diagnostics from this hospitalization (including imaging, microbiology, ancillary and laboratory) are listed below for reference.   Imaging Studies: DG Chest 2 View  Result Date: 12/14/2022 CLINICAL DATA:  Shortness of breath and cough. Wheezing. COVID positive 2 weeks ago. EXAM: CHEST - 2 VIEW COMPARISON:  Chest radiographs 08/09/2017 and CT 03/22/2022 FINDINGS: The cardiomediastinal silhouette is unchanged with normal heart size. Chronic interstitial densities/fibrotic changes are again seen in the right upper lobe with associated volume loss and  bronchiectasis, similar to the prior studies. Milder interstitial densities are again seen in the lung bases. No acute airspace consolidation, pleural effusion, or pneumothorax is identified. No acute osseous abnormality is seen. IMPRESSION: Chronic lung changes without evidence of acute cardiopulmonary process. Electronically Signed   By: Sebastian Ache M.D.   On: 12/14/2022 15:58    Microbiology: Results for orders placed or performed during the hospital encounter of 12/14/22  Resp panel by RT-PCR (RSV, Flu A&B, Covid) Anterior Nasal Swab     Status: Abnormal   Collection Time: 12/14/22  1:24 PM   Specimen: Anterior Nasal Swab  Result Value Ref Range Status   SARS Coronavirus 2 by RT PCR POSITIVE (A) NEGATIVE Final    Comment: (NOTE) SARS-CoV-2 target nucleic acids are DETECTED.  The SARS-CoV-2 RNA is generally detectable in upper respiratory specimens during the acute phase of infection. Positive results are indicative of the presence of the identified  virus, but do not rule out bacterial infection or co-infection with other pathogens not detected by the test. Clinical correlation with patient history and other diagnostic information is necessary to determine patient infection status. The expected result is Negative.  Fact Sheet for Patients: BloggerCourse.com  Fact Sheet for Healthcare Providers: SeriousBroker.it  This test is not yet approved or cleared by the [place] FDA and  has been authorized for detection and/or diagnosis of SARS-CoV-2 by FDA under an Emergency Use Authorization (EUA).  This EUA will remain in effect (meaning this test can be used) for the duration of  the COVID-19 declaration under Section 564(b)(1) of the A ct, 21 U.S.C. section 360bbb-3(b)(1), unless the authorization is terminated or revoked sooner.     Influenza A by PCR NEGATIVE NEGATIVE Final   Influenza B by PCR NEGATIVE NEGATIVE Final    Comment: (NOTE) The Xpert Xpress SARS-CoV-2/FLU/RSV plus assay is intended as an aid in the diagnosis of influenza from Nasopharyngeal swab specimens and should not be used as a sole basis for treatment. Nasal washings and aspirates are unacceptable for Xpert Xpress SARS-CoV-2/FLU/RSV testing.  Fact Sheet for Patients: BloggerCourse.com  Fact Sheet for Healthcare Providers: SeriousBroker.it  This test is not yet approved or cleared by the [place] FDA and has been authorized for detection and/or diagnosis of SARS-CoV-2 by FDA under an Emergency Use Authorization (EUA). This EUA will remain in effect (meaning this test can be used) for the duration of the COVID-19 declaration under Section 564(b)(1) of the Act, 21 U.S.C. section 360bbb-3(b)(1), unless the authorization is terminated or revoked.     Resp Syncytial Virus by PCR NEGATIVE NEGATIVE Final    Comment: (NOTE) Fact Sheet for  Patients: BloggerCourse.com  Fact Sheet for Healthcare Providers: SeriousBroker.it  This test is not yet approved or cleared by the [place] FDA and has been authorized for detection and/or diagnosis of SARS-CoV-2 by FDA under an Emergency Use Authorization (EUA). This EUA will remain in effect (meaning this test can be used) for the duration of the COVID-19 declaration under Section 564(b)(1) of the Act, 21 U.S.C. section 360bbb-3(b)(1), unless the authorization is terminated or revoked.  Performed at [facility], [address] [place], [place] [address]     Labs: CBC: Recent Labs  Lab 12/14/22 1420 12/15/22 0713  WBC 5.5 4.6  NEUTROABS 2.9  --   HGB 12.1 11.9*  HCT 36.3 36.6  MCV 92.4 94.6  PLT 297 298   Basic Metabolic Panel: Recent Labs  Lab 12/14/22 1420 12/15/22 0713  NA 139 135  K 3.7 3.8  CL 105 103  CO2 25 23  GLUCOSE 124* 209*  BUN 19 15  CREATININE 0.80 0.71  CALCIUM 9.3 9.2   Liver Function Tests: Recent Labs  Lab 12/14/22 1420 12/15/22 0713  AST 18 15  ALT 10 10  ALKPHOS 62 60  BILITOT 0.7 0.6  PROT 8.6* 8.1  ALBUMIN 3.8 3.6   CBG: Recent Labs  Lab 12/14/22 2107 12/15/22 0725  GLUCAP 352* 228*    Discharge time spent: greater than 30 minutes.  Signed: Jonah Blue, MD [facility] 12/15/2022

## 2022-12-28 ENCOUNTER — Other Ambulatory Visit: Payer: Self-pay | Admitting: Family Medicine

## 2022-12-31 ENCOUNTER — Encounter: Payer: Self-pay | Admitting: Internal Medicine

## 2022-12-31 ENCOUNTER — Ambulatory Visit: Payer: Medicare Other | Admitting: Internal Medicine

## 2022-12-31 VITALS — BP 140/100 | HR 85 | Temp 98.1°F | Ht 60.0 in | Wt 196.0 lb

## 2022-12-31 DIAGNOSIS — J45901 Unspecified asthma with (acute) exacerbation: Secondary | ICD-10-CM | POA: Diagnosis not present

## 2022-12-31 DIAGNOSIS — Z7984 Long term (current) use of oral hypoglycemic drugs: Secondary | ICD-10-CM

## 2022-12-31 DIAGNOSIS — E119 Type 2 diabetes mellitus without complications: Secondary | ICD-10-CM | POA: Diagnosis not present

## 2022-12-31 MED ORDER — ONETOUCH DELICA PLUS LANCET33G MISC: 11 refills | Status: DC

## 2022-12-31 MED ORDER — ONETOUCH VERIO VI STRP: ORAL_STRIP | 3 refills | Status: DC

## 2022-12-31 NOTE — Progress Notes (Unsigned)
   Subjective:   Patient ID: Geoffery Lyons, female    DOB: [birth year], 89 y.o.   MRN: [id number]  HPI The patient is an 89 YO female coming in for hospital follow up. Had covid-19 which triggered an asthma flare she was treated with steroids and recovered gradually. She is doing  PMH, [facility], social history reviewed and updated. Discharge medication reconciliation done.  Review of Systems  Objective:  Physical Exam  Vitals:   12/31/22 0946 12/31/22 0951  BP: (!) 140/100 (!) 140/100  Pulse: 85   Temp: 98.1 F (36.7 C)   TempSrc: Oral   SpO2: 91%   Weight: 196 lb (88.9 kg)   Height: 5' (1.524 m)     Assessment & Plan:

## 2022-12-31 NOTE — Patient Instructions (Signed)
The sugars will come back down on their own over the next 2-4 weeks.

## 2023-01-01 ENCOUNTER — Telehealth: Payer: Self-pay | Admitting: Pharmacy Technician

## 2023-01-01 ENCOUNTER — Other Ambulatory Visit (HOSPITAL_COMMUNITY): Payer: Self-pay

## 2023-01-01 NOTE — Assessment & Plan Note (Signed)
Sugars are well controlled overall and taking metformin 500 mg daily. Since high dose steroid regimen she has had elevated sugars initially 300s and now into 200s. She will continue diet and water intake to help. She is resolving and we talked about how these will resolve gradually over the next few weeks. If persistently high or increasing let us know. Otherwise she is at risk for low sugar episodes if we add temporary treatment so we decided not to adjust regimen.

## 2023-01-01 NOTE — Assessment & Plan Note (Signed)
She is resolving and was shown how to use her nebulizer today. She can use this as needed or albuterol inhaler. SOB is resolving and she is encouraged to gradually increase activity.

## 2023-01-01 NOTE — Telephone Encounter (Signed)
Pharmacy Patient Advocate Encounter   Received notification from CoverMyMeds that prior authorization for Fluticasone Propionate HFA 110MCG/ACT aerosol is required/requested.   Insurance verification completed.   The patient is insured through [facility] .   Per test claim: PA required; PA submitted to [facility] via CoverMyMeds Key/confirmation #/EOC [id number] Status is pending

## 2023-01-03 ENCOUNTER — Ambulatory Visit: Payer: Medicare Other | Admitting: Podiatry

## 2023-01-03 ENCOUNTER — Other Ambulatory Visit (HOSPITAL_COMMUNITY): Payer: Self-pay

## 2023-01-03 ENCOUNTER — Encounter: Payer: Self-pay | Admitting: Podiatry

## 2023-01-03 VITALS — BP 162/74 | HR 97

## 2023-01-03 DIAGNOSIS — E1149 Type 2 diabetes mellitus with other diabetic neurological complication: Secondary | ICD-10-CM

## 2023-01-03 DIAGNOSIS — B351 Tinea unguium: Secondary | ICD-10-CM

## 2023-01-03 DIAGNOSIS — M79609 Pain in unspecified limb: Secondary | ICD-10-CM

## 2023-01-03 NOTE — Progress Notes (Signed)
Subjective: Chief Complaint  Patient presents with   Diabetes    "Cut my toenails.  He canceled my last appointment, so they have really grown.  They are painful."    89 year old female presents the office today with the above concerns. No open sores, redness, drianage or sings of infection. No open wounds.   Last A1c 7.0 on 03/26/2022  Felicia Broker, MD Last seen 12/31/2022  Objective: AAO x3, NAD DP/PT pulses palpable bilaterally, CRT less than 3 seconds Nails are hypertrophic, dystrophic, brittle, discolored, elongated 10. No surrounding redness or drainage. Tenderness nails 1-5 bilaterally.  No ulcerations No pain with calf compression, swelling, warmth, erythema  Assessment: Symptomatic onychomycosis  Plan: -Treatment options discussed including all alternatives, risks, and complications -Etiology of symptoms were discussed -Nails debrided 10 without complications or bleeding. -Daily foot inspection -Follow-up in 3 months or sooner if any problems arise. In the meantime, encouraged to call the office with any questions, concerns, change in symptoms.   Return in about 3 months (around 04/05/2023).  [name], DPM

## 2023-01-16 ENCOUNTER — Ambulatory Visit: Payer: Medicare Other | Admitting: Internal Medicine

## 2023-01-18 ENCOUNTER — Encounter: Payer: Self-pay | Admitting: Family Medicine

## 2023-01-18 ENCOUNTER — Ambulatory Visit (INDEPENDENT_AMBULATORY_CARE_PROVIDER_SITE_OTHER): Payer: Medicare Other | Admitting: Family Medicine

## 2023-01-18 ENCOUNTER — Ambulatory Visit (INDEPENDENT_AMBULATORY_CARE_PROVIDER_SITE_OTHER): Payer: Medicare Other

## 2023-01-18 VITALS — BP 116/78 | HR 78 | Temp 97.4°F | Ht 60.0 in | Wt 196.8 lb

## 2023-01-18 DIAGNOSIS — J45901 Unspecified asthma with (acute) exacerbation: Secondary | ICD-10-CM

## 2023-01-18 DIAGNOSIS — J8410 Pulmonary fibrosis, unspecified: Secondary | ICD-10-CM | POA: Diagnosis not present

## 2023-01-18 DIAGNOSIS — R051 Acute cough: Secondary | ICD-10-CM | POA: Diagnosis not present

## 2023-01-18 DIAGNOSIS — R0602 Shortness of breath: Secondary | ICD-10-CM | POA: Diagnosis not present

## 2023-01-18 MED ORDER — PROMETHAZINE-DM 6.25-15 MG/5ML PO SYRP
2.5000 mL | ORAL_SOLUTION | Freq: Every evening | ORAL | 0 refills | Status: DC | PRN
Start: 2023-01-18 — End: 2023-05-27

## 2023-01-18 MED ORDER — TRELEGY ELLIPTA 100-62.5-25 MCG/ACT IN AEPB
1.0000 | INHALATION_SPRAY | Freq: Every day | RESPIRATORY_TRACT | 1 refills | Status: DC
Start: 2023-01-18 — End: 2023-04-29

## 2023-01-18 MED ORDER — PREDNISONE 20 MG PO TABS
40.0000 mg | ORAL_TABLET | Freq: Every day | ORAL | 0 refills | Status: DC
Start: 2023-01-18 — End: 2023-05-27

## 2023-01-18 NOTE — Patient Instructions (Signed)
Please go downstairs for a chest X ray.  Start new inhaler Trelegy if affordable. If not affordable, let us know.   Take the oral steroids with food.   We will be in touch with your X ray results.

## 2023-01-18 NOTE — Progress Notes (Signed)
Subjective:  Felicia Martin is a 89 y.o. female who presents for a 5 day hx of cough, wheezing worse at night, mild shortness of breath while coughing. Using nebulizer which helps temporarily.  She also uses albuterol inhaler.  Currently not on maintenance therapy but has been in the past.  Denies fever, chills, dizziness, chest pain, palpitations, abdominal pain, N/V/D, urinary symptoms, LE edema.   She had Covid in September.    No other aggravating or relieving factors.  No other c/o.  ROS as in subjective.   Objective: Vitals:   01/18/23 0939  BP: 116/78  Pulse: 78  Temp: (!) 97.4 F (36.3 C)  SpO2: 95%    General appearance: Alert, WD/WN, no distress, mildly ill appearing                             Skin: warm, no rash                           Head: no sinus tenderness                            Eyes: conjunctiva normal, corneas clear, PERRLA                            Ears: pearly TMs, external ear canals normal                          Nose: septum midline, turbinates swollen, with erythema and clear discharge             Mouth/throat: MMM, tongue normal, mild pharyngeal erythema                           Neck: supple, no adenopathy, no thyromegaly, nontender                          Heart: RRR                         Lungs: + wheezes throughout, no rales, or rhonchi      Assessment: Asthma, chronic, unspecified asthma severity, with acute exacerbation - Plan: DG Chest 2 View, Fluticasone-Umeclidin-Vilant (TRELEGY ELLIPTA) 100-62.5-25 MCG/ACT AEPB, predniSONE (DELTASONE) 20 MG tablet  Acute cough   Plan: Reviewed recent hospital discharge summary and PCP notes regarding COVID and asthma. No acute distress. Chest x-ray ordered. Oral prednisone and Trelegy prescribed.  Advised her to let us know if Trelegy is not covered and we will switch it to a different maintenance inhaler. Request medication for cough at night to help her sleep.  She has been taking  hydrocodone in the past.  Promethazine DM prescribed. Chest x-ray negative for pneumonia.  Antibiotic therapy is not warranted.

## 2023-01-25 NOTE — Telephone Encounter (Signed)
Pharmacy Patient Advocate Encounter  Received notification from [facility] that Prior Authorization for Fluticasone Propionate HFA 110MCG/ACT aerosol has been DENIED.  Full denial letter will be uploaded to the media tab. See denial reason below.  FLUTICAS HFA AER is denied because it is not on your plan's Drug List (formulary). Medication authorization requires the following: One of the following: (1) You need to try 1 of these covered drugs: Arnuity Ellipta. (2) Your doctor needs to give [place] specific medical reasons why 1 of the covered drug(s) is not appropriate for you.  PA #/Case ID/Reference #: [id number]

## 2023-04-04 ENCOUNTER — Ambulatory Visit: Payer: Medicare Other | Admitting: Podiatry

## 2023-04-04 ENCOUNTER — Encounter: Payer: Self-pay | Admitting: Podiatry

## 2023-04-04 DIAGNOSIS — B351 Tinea unguium: Secondary | ICD-10-CM

## 2023-04-04 DIAGNOSIS — M79609 Pain in unspecified limb: Secondary | ICD-10-CM | POA: Diagnosis not present

## 2023-04-04 DIAGNOSIS — E1149 Type 2 diabetes mellitus with other diabetic neurological complication: Secondary | ICD-10-CM

## 2023-04-04 NOTE — Progress Notes (Signed)
Subjective: Chief Complaint  Patient presents with   [facility]    RM#11 [employer] patient has no other concerns today.    89 year old female presents the office today with the above concerns. No open sores, redness, drianage or sings of infection. No open wounds.   Last A1c 6.7 on 12/04/2022  Myrlene Broker, MD Last seen 12/31/2022  Objective: AAO x3, NAD DP/PT pulses palpable bilaterally, CRT less than 3 seconds Nails are hypertrophic, dystrophic, brittle, discolored, elongated 10. No surrounding redness or drainage. Tenderness nails 1-5 bilaterally.  No ulcerations No pain with calf compression, swelling, warmth, erythema  Assessment: Symptomatic onychomycosis  Plan: -Treatment options discussed including all alternatives, risks, and complications -Etiology of symptoms were discussed -Nails debrided 10 without complications or bleeding. -Daily foot inspection -Follow-up in 3 months or sooner if any problems arise. In the meantime, encouraged to call the office with any questions, concerns, change in symptoms.   Return in about 3 months (around 07/03/2023).  [name], DPM

## 2023-04-27 ENCOUNTER — Other Ambulatory Visit: Payer: Self-pay | Admitting: Family Medicine

## 2023-04-27 DIAGNOSIS — J45901 Unspecified asthma with (acute) exacerbation: Secondary | ICD-10-CM

## 2023-05-20 ENCOUNTER — Other Ambulatory Visit: Payer: Medicare Other

## 2023-05-27 ENCOUNTER — Ambulatory Visit (INDEPENDENT_AMBULATORY_CARE_PROVIDER_SITE_OTHER): Payer: Medicare Other | Admitting: Internal Medicine

## 2023-05-27 ENCOUNTER — Encounter: Payer: Self-pay | Admitting: Internal Medicine

## 2023-05-27 VITALS — BP 140/80 | HR 78 | Temp 98.5°F | Ht 60.0 in | Wt 198.0 lb

## 2023-05-27 DIAGNOSIS — E1142 Type 2 diabetes mellitus with diabetic polyneuropathy: Secondary | ICD-10-CM | POA: Diagnosis not present

## 2023-05-27 DIAGNOSIS — J45901 Unspecified asthma with (acute) exacerbation: Secondary | ICD-10-CM | POA: Diagnosis not present

## 2023-05-27 DIAGNOSIS — G720 Drug-induced myopathy: Secondary | ICD-10-CM

## 2023-05-27 DIAGNOSIS — E1169 Type 2 diabetes mellitus with other specified complication: Secondary | ICD-10-CM

## 2023-05-27 DIAGNOSIS — E118 Type 2 diabetes mellitus with unspecified complications: Secondary | ICD-10-CM

## 2023-05-27 DIAGNOSIS — I10 Essential (primary) hypertension: Secondary | ICD-10-CM | POA: Diagnosis not present

## 2023-05-27 DIAGNOSIS — E785 Hyperlipidemia, unspecified: Secondary | ICD-10-CM

## 2023-05-27 DIAGNOSIS — R300 Dysuria: Secondary | ICD-10-CM | POA: Diagnosis not present

## 2023-05-27 DIAGNOSIS — I272 Pulmonary hypertension, unspecified: Secondary | ICD-10-CM

## 2023-05-27 DIAGNOSIS — Z7984 Long term (current) use of oral hypoglycemic drugs: Secondary | ICD-10-CM | POA: Diagnosis not present

## 2023-05-27 DIAGNOSIS — T466X5A Adverse effect of antihyperlipidemic and antiarteriosclerotic drugs, initial encounter: Secondary | ICD-10-CM | POA: Diagnosis not present

## 2023-05-27 DIAGNOSIS — E119 Type 2 diabetes mellitus without complications: Secondary | ICD-10-CM

## 2023-05-27 LAB — URINALYSIS, ROUTINE W REFLEX MICROSCOPIC
Bilirubin Urine: NEGATIVE
Hgb urine dipstick: NEGATIVE
Ketones, ur: NEGATIVE
Nitrite: NEGATIVE
RBC / HPF: NONE SEEN (ref 0–?)
Specific Gravity, Urine: 1.025 (ref 1.000–1.030)
Urine Glucose: NEGATIVE
Urobilinogen, UA: 0.2 (ref 0.0–1.0)
pH: 6 (ref 5.0–8.0)

## 2023-05-27 LAB — POCT GLYCOSYLATED HEMOGLOBIN (HGB A1C): HbA1c POC (<> result, manual entry): 6.9 % (ref 4.0–5.6)

## 2023-05-27 MED ORDER — BENZONATATE 200 MG PO CAPS
200.0000 mg | ORAL_CAPSULE | Freq: Three times a day (TID) | ORAL | 0 refills | Status: DC | PRN
Start: 2023-05-27 — End: 2023-06-24

## 2023-05-27 NOTE — Patient Instructions (Addendum)
 Your sugars are doing good. Come back in about 6 months for the physical.  We have sent in the tessalon perles for the cough you can use up to 3 times a day.

## 2023-05-27 NOTE — Progress Notes (Unsigned)
   Subjective:   Patient ID: Felicia Martin, female    DOB: 06-12-1932, 89 y.o.   MRN: [id number]  HPI The patient is an 89 YO female coming in for medical management and some chronic cough. See A/P for details.   Review of Systems  Constitutional: Negative.   HENT: Negative.    Eyes: Negative.   Respiratory:  Negative for cough, chest tightness and shortness of breath.   Cardiovascular:  Negative for chest pain, palpitations and leg swelling.  Gastrointestinal:  Negative for abdominal distention, abdominal pain, constipation, diarrhea, nausea and vomiting.  Musculoskeletal: Negative.   Skin: Negative.   Neurological: Negative.   Psychiatric/Behavioral: Negative.      Objective:  Physical Exam Constitutional:      Appearance: She is well-developed.  HENT:     Head: Normocephalic and atraumatic.  Cardiovascular:     Rate and Rhythm: Normal rate and regular rhythm.  Pulmonary:     Effort: Pulmonary effort is normal. No respiratory distress.     Breath sounds: Normal breath sounds. No wheezing or rales.  Abdominal:     General: Bowel sounds are normal. There is no distension.     Palpations: Abdomen is soft.     Tenderness: There is no abdominal tenderness. There is no rebound.  Musculoskeletal:     Cervical back: Normal range of motion.  Skin:    General: Skin is warm and dry.  Neurological:     Mental Status: She is alert and oriented to person, place, and time.     Coordination: Coordination normal.     Vitals:   05/27/23 0809  BP: (!) 140/80  Pulse: 78  Temp: 98.5 F (36.9 C)  TempSrc: Oral  SpO2: 94%  Weight: 198 lb (89.8 kg)  Height: 5' (1.524 m)    Assessment & Plan:  Visit time 25 minutes in face to face communication with patient and coordination of care, additional 15 minutes spent in record review, coordination or care, ordering tests, communicating/referring to other healthcare professionals, documenting in medical records all on the same day of  the visit for total time 40 minutes spent on the visit.

## 2023-05-30 NOTE — Assessment & Plan Note (Signed)
 Has seen pulmonary and they are managing. Some chronic cough and SOB. Stable overall.

## 2023-05-30 NOTE — Assessment & Plan Note (Signed)
 Worsening cough and rx tessalon perles to use up to TID. She is using trelegy and albuterol as needed already. No indication for antibiotics or steroids today. They asked for refill of promethazine/dm cough syrup but I do not recommend this for long term use due to risk of sedation and confusion.

## 2023-05-30 NOTE — Assessment & Plan Note (Signed)
 POC Hga1c done at visit and stable. Continue metformin 500 mg daily.

## 2023-05-30 NOTE — Assessment & Plan Note (Signed)
 Still unable to try statins due to this.

## 2023-05-30 NOTE — Assessment & Plan Note (Signed)
 Recent lipid panel high she has statin myopathy and refuses to try alternative treatment.

## 2023-05-30 NOTE — Assessment & Plan Note (Signed)
 Stable and taking no meds for this. Not progressed.

## 2023-05-30 NOTE — Assessment & Plan Note (Signed)
BP at goal on amlodipine 5 mg daily and will continue.

## 2023-05-30 NOTE — Assessment & Plan Note (Signed)
 BMI 38 and complicated by diabetes and hypertension and hyperlipidemia. Counseled about diet for weight management.

## 2023-05-31 ENCOUNTER — Telehealth: Payer: Self-pay | Admitting: *Deleted

## 2023-05-31 NOTE — Progress Notes (Signed)
 Complex Care Management Note Care Guide Note  05/31/2023 Name: Felicia Martin MRN: [id number] DOB: 1932/07/11   Complex Care Management Outreach Attempts: An unsuccessful telephone outreach was attempted today to offer the patient information about available complex care management services.  Follow Up Plan:  Additional outreach attempts will be made to offer the patient complex care management information and services.   Encounter Outcome:  No Answer  Burman Nieves, CMA, Care Guide [facility]  [facility], [facility] Guide Direct Dial: [phone]  Fax: [phone] Website: [email or web address]

## 2023-05-31 NOTE — Progress Notes (Signed)
 Care Guide Pharmacy Note  05/31/2023 Name: Felicia Martin MRN: [id number] DOB: 10-18-1932  Referred By: Myrlene Broker, MD Reason for referral: Care Coordination (Outreach to schedule referral with pharmacist )   Felicia Martin is a 89 y.o. year old female who is a primary care patient of Myrlene Broker, MD.  Felicia Martin was referred to the pharmacist for assistance related to:  pulmonary HTN  Successful contact was made with the patient to discuss pharmacy services including being ready for the pharmacist to call at least 5 minutes before the scheduled appointment time and to have medication bottles and any blood pressure readings ready for review. The patient agreed to meet with the pharmacist via telephone visit on 06/20/2023 @ 11am   SIG

## 2023-06-03 ENCOUNTER — Ambulatory Visit: Payer: Self-pay | Admitting: Internal Medicine

## 2023-06-03 NOTE — Telephone Encounter (Signed)
  Chief Complaint: Cough Symptoms: cough and some wheezing only at night per patient report Frequency: cough started last week  Pertinent Negatives: Patient denies fever, CP Disposition: [] ED /[] Urgent Care (no appt availability in office) / [x] Appointment(In office/virtual)/ []  [place] [facility]/ [] Home Care/ [] Refused Recommended Disposition /[] [place] [facility]/ []  Follow-up with PCP Additional Notes: patient called with concerns for cough and some wheezing that happens at night. Patient states she doesn't have any issues with coughing or any wheezing during the day. Patient states when she saw her PCP last week, she was started on tessalon pearles but they didn't help her cough. Per protocol, patient is recommended to be seen within 24 hours. Appointment made with PCP for 06/04/2023 at 10:40 am for reevaluation. Patient verbalized understanding of plan and all questions answered.    Copied From CRM [phone]. Reason for Triage: French Ana - daughter states mom asked her to call, she is not feeling well stating she has a cold and a lot of congestion in her chest.   Callback number - patient: [phone]  If unable to reach pt, please call French Ana: [phone]    Reason for Disposition  [1] Continuous (nonstop) coughing interferes with work or school AND [2] no improvement using cough treatment per Care Advice  Answer Assessment - Initial Assessment Questions 1. ONSET: "When did the cough begin?"      Began last week 2. SEVERITY: "How bad is the cough today?"      Worse at night-patient reports that it doesn't bother her during the day 3. SPUTUM: "Describe the color of your sputum" (none, dry cough; clear, white, yellow, green)     Unable to cough anything up 4. HEMOPTYSIS: "Are you coughing up any blood?" If so ask: "How much?" (flecks, streaks, tablespoons, etc.)     no 5. DIFFICULTY BREATHING: "Are you having difficulty breathing?" If Yes, ask: "How bad is it?" (e.g., mild,  moderate, severe)    - MILD: No SOB at rest, mild SOB with walking, speaks normally in sentences, can lie down, no retractions, pulse < 100.    - MODERATE: SOB at rest, SOB with minimal exertion and prefers to sit, cannot lie down flat, speaks in phrases, mild retractions, audible wheezing, pulse 100-120.    - SEVERE: Very SOB at rest, speaks in single words, struggling to breathe, sitting hunched forward, retractions, pulse > 120      No 6. FEVER: "Do you have a fever?" If Yes, ask: "What is your temperature, how was it measured, and when did it start?"     No 7. CARDIAC HISTORY: "Do you have any history of heart disease?" (e.g., heart attack, congestive heart failure)      No 8. LUNG HISTORY: "Do you have any history of lung disease?"  (e.g., pulmonary embolus, asthma, emphysema)     asthma 9. PE RISK FACTORS: "Do you have a history of blood clots?" (or: recent major surgery, recent prolonged travel, bedridden)     no 10. OTHER SYMPTOMS: "Do you have any other symptoms?" (e.g., runny nose, wheezing, chest pain)       Runny nose, some wheezing at night 12. TRAVEL: "Have you traveled out of the country in the last month?" (e.g., travel history, exposures)       No  Protocols used: Cough - Acute Non-Productive-A-AH

## 2023-06-04 ENCOUNTER — Ambulatory Visit (INDEPENDENT_AMBULATORY_CARE_PROVIDER_SITE_OTHER): Admitting: Internal Medicine

## 2023-06-04 ENCOUNTER — Encounter: Payer: Self-pay | Admitting: Internal Medicine

## 2023-06-04 VITALS — BP 140/80 | HR 85 | Temp 98.3°F | Ht 60.0 in | Wt 197.0 lb

## 2023-06-04 DIAGNOSIS — R053 Chronic cough: Secondary | ICD-10-CM

## 2023-06-04 MED ORDER — CETIRIZINE HCL 10 MG PO TABS: 10.0000 mg | ORAL_TABLET | Freq: Every day | ORAL | 3 refills | Status: AC

## 2023-06-04 MED ORDER — FLUTICASONE PROPIONATE 50 MCG/ACT NA SUSP: 2.0000 | Freq: Every day | NASAL | 6 refills | Status: AC

## 2023-06-04 NOTE — Patient Instructions (Signed)
 We will have you start flonase which is a nose spray to use 2 sprays in each nostril daily (in the evening).   We will also have you start cetirizine (zyrtec) 1 pill daily to help with allergies.   These should help within 1-2 weeks with the night time coughing and wheezing.   Let us know if not improving.

## 2023-06-04 NOTE — Progress Notes (Signed)
   Subjective:   Patient ID: Felicia Martin, female    DOB: December 03, 1932, 89 y.o.   MRN: [id number]  Cough Associated symptoms include wheezing. Pertinent negatives include no chest pain or shortness of breath.   The patient is an 89 YO female coming in for chronic night time cough. We had prescribed tessalon perles to use and these have not helped much. Denies SOB with activity. Denies much daytime cough. Is not taking anything for allergies but knows she has post nasal drip worse lately.   Review of Systems  Constitutional: Negative.   HENT: Negative.    Eyes: Negative.   Respiratory:  Positive for cough and wheezing. Negative for chest tightness and shortness of breath.   Cardiovascular:  Negative for chest pain, palpitations and leg swelling.  Gastrointestinal:  Negative for abdominal distention, abdominal pain, constipation, diarrhea, nausea and vomiting.  Musculoskeletal: Negative.   Skin: Negative.   Neurological: Negative.   Psychiatric/Behavioral: Negative.      Objective:  Physical Exam Constitutional:      Appearance: She is well-developed.  HENT:     Head: Normocephalic and atraumatic.  Cardiovascular:     Rate and Rhythm: Normal rate and regular rhythm.  Pulmonary:     Effort: Pulmonary effort is normal. No respiratory distress.     Breath sounds: Wheezing and rhonchi present. No rales.  Abdominal:     General: Bowel sounds are normal. There is no distension.     Palpations: Abdomen is soft.     Tenderness: There is no abdominal tenderness. There is no rebound.  Musculoskeletal:     Cervical back: Normal range of motion.  Skin:    General: Skin is warm and dry.  Neurological:     Mental Status: She is alert and oriented to person, place, and time.     Coordination: Coordination normal.     Vitals:   06/04/23 1030  BP: (!) 140/80  Pulse: 85  Temp: 98.3 F (36.8 C)  TempSrc: Oral  SpO2: 94%  Weight: 197 lb (89.4 kg)  Height: 5' (1.524 m)     Assessment & Plan:

## 2023-06-04 NOTE — Assessment & Plan Note (Addendum)
 Suspect post nasal drip is agitating. She is not taking anything for this. Rx cetirizine 10 mg daily and flonase daily to help. She is on maximum treatment for her asthma with trelegy and bronchodilator. No flare today with lung exam at baseline and several chest x-ray for this same cough without new findings.

## 2023-06-07 ENCOUNTER — Emergency Department (HOSPITAL_COMMUNITY)

## 2023-06-07 ENCOUNTER — Observation Stay (HOSPITAL_COMMUNITY)
Admission: EM | Admit: 2023-06-07 | Discharge: 2023-06-08 | Disposition: A | Discharge status: Home / Self Care | Attending: Emergency Medicine | Admitting: Emergency Medicine

## 2023-06-07 ENCOUNTER — Encounter (HOSPITAL_COMMUNITY): Payer: Self-pay

## 2023-06-07 ENCOUNTER — Other Ambulatory Visit: Payer: Self-pay

## 2023-06-07 DIAGNOSIS — Z79899 Other long term (current) drug therapy: Secondary | ICD-10-CM | POA: Insufficient documentation

## 2023-06-07 DIAGNOSIS — R918 Other nonspecific abnormal finding of lung field: Secondary | ICD-10-CM | POA: Diagnosis not present

## 2023-06-07 DIAGNOSIS — J8410 Pulmonary fibrosis, unspecified: Secondary | ICD-10-CM | POA: Diagnosis not present

## 2023-06-07 DIAGNOSIS — E119 Type 2 diabetes mellitus without complications: Secondary | ICD-10-CM | POA: Insufficient documentation

## 2023-06-07 DIAGNOSIS — J4541 Moderate persistent asthma with (acute) exacerbation: Secondary | ICD-10-CM | POA: Diagnosis not present

## 2023-06-07 DIAGNOSIS — Z1152 Encounter for screening for COVID-19: Secondary | ICD-10-CM | POA: Diagnosis not present

## 2023-06-07 DIAGNOSIS — J45901 Unspecified asthma with (acute) exacerbation: Principal | ICD-10-CM | POA: Insufficient documentation

## 2023-06-07 DIAGNOSIS — I10 Essential (primary) hypertension: Secondary | ICD-10-CM | POA: Diagnosis not present

## 2023-06-07 DIAGNOSIS — E785 Hyperlipidemia, unspecified: Secondary | ICD-10-CM | POA: Diagnosis not present

## 2023-06-07 DIAGNOSIS — Z7984 Long term (current) use of oral hypoglycemic drugs: Secondary | ICD-10-CM | POA: Diagnosis not present

## 2023-06-07 DIAGNOSIS — D869 Sarcoidosis, unspecified: Secondary | ICD-10-CM | POA: Insufficient documentation

## 2023-06-07 DIAGNOSIS — Z87891 Personal history of nicotine dependence: Secondary | ICD-10-CM | POA: Insufficient documentation

## 2023-06-07 DIAGNOSIS — R0602 Shortness of breath: Secondary | ICD-10-CM | POA: Diagnosis not present

## 2023-06-07 DIAGNOSIS — R062 Wheezing: Secondary | ICD-10-CM | POA: Diagnosis not present

## 2023-06-07 LAB — BRAIN NATRIURETIC PEPTIDE: B Natriuretic Peptide: 36.5 pg/mL (ref 0.0–100.0)

## 2023-06-07 LAB — CBC WITH DIFFERENTIAL/PLATELET
Abs Immature Granulocytes: 0.01 10*3/uL (ref 0.00–0.07)
Basophils Absolute: 0 10*3/uL (ref 0.0–0.1)
Basophils Relative: 1 %
Eosinophils Absolute: 0.6 10*3/uL — ABNORMAL HIGH (ref 0.0–0.5)
Eosinophils Relative: 11 %
HCT: 37.5 % (ref 36.0–46.0)
Hemoglobin: 12 g/dL (ref 12.0–15.0)
Immature Granulocytes: 0 %
Lymphocytes Relative: 24 %
Lymphs Abs: 1.3 10*3/uL (ref 0.7–4.0)
MCH: 29.9 pg (ref 26.0–34.0)
MCHC: 32 g/dL (ref 30.0–36.0)
MCV: 93.5 fL (ref 80.0–100.0)
Monocytes Absolute: 0.5 10*3/uL (ref 0.1–1.0)
Monocytes Relative: 10 %
Neutro Abs: 2.9 10*3/uL (ref 1.7–7.7)
Neutrophils Relative %: 54 %
Platelets: 320 10*3/uL (ref 150–400)
RBC: 4.01 MIL/uL (ref 3.87–5.11)
RDW: 12.5 % (ref 11.5–15.5)
WBC: 5.4 10*3/uL (ref 4.0–10.5)
nRBC: 0 % (ref 0.0–0.2)

## 2023-06-07 LAB — BASIC METABOLIC PANEL
Anion gap: 10 (ref 5–15)
BUN: 17 mg/dL (ref 8–23)
CO2: 25 mmol/L (ref 22–32)
Calcium: 9.5 mg/dL (ref 8.9–10.3)
Chloride: 103 mmol/L (ref 98–111)
Creatinine, Ser: 0.74 mg/dL (ref 0.44–1.00)
GFR, Estimated: >60 mL/min (ref >60)
Glucose, Bld: 128 mg/dL — ABNORMAL HIGH (ref 70–99)
Potassium: 3.4 mmol/L — ABNORMAL LOW (ref 3.5–5.1)
Sodium: 138 mmol/L (ref 135–145)

## 2023-06-07 LAB — CBG MONITORING, ED: Glucose-Capillary: 264 mg/dL — ABNORMAL HIGH (ref 70–99)

## 2023-06-07 LAB — RESP PANEL BY RT-PCR (RSV, FLU A&B, COVID)  RVPGX2
Influenza A by PCR: NEGATIVE
Influenza B by PCR: NEGATIVE
Resp Syncytial Virus by PCR: NEGATIVE
SARS Coronavirus 2 by RT PCR: NEGATIVE

## 2023-06-07 LAB — GLUCOSE, CAPILLARY: Glucose-Capillary: 282 mg/dL — ABNORMAL HIGH (ref 70–99)

## 2023-06-07 LAB — TROPONIN I (HIGH SENSITIVITY): Troponin I (High Sensitivity): 4 ng/L (ref <18)

## 2023-06-07 MED ORDER — ALBUTEROL SULFATE HFA 108 (90 BASE) MCG/ACT IN AERS
2.0000 | INHALATION_SPRAY | RESPIRATORY_TRACT | Status: DC | PRN
  Administered 2023-06-07: 2 via RESPIRATORY_TRACT
  Filled 2023-06-07: qty 6.7

## 2023-06-07 MED ORDER — INSULIN ASPART 100 UNIT/ML IJ SOLN
0.0000 [IU] | Freq: Every day | INTRAMUSCULAR | Status: DC
  Administered 2023-06-07: 3 [IU] via SUBCUTANEOUS
  Filled 2023-06-07: qty 0.05

## 2023-06-07 MED ORDER — METFORMIN HCL ER 500 MG PO TB24: 500.0000 mg | ORAL_TABLET | Freq: Every day | ORAL | Status: DC

## 2023-06-07 MED ORDER — UMECLIDINIUM BROMIDE 62.5 MCG/ACT IN AEPB
1.0000 | INHALATION_SPRAY | Freq: Every day | RESPIRATORY_TRACT | Status: DC
  Administered 2023-06-08: 1 via RESPIRATORY_TRACT
  Filled 2023-06-07: qty 7

## 2023-06-07 MED ORDER — IPRATROPIUM-ALBUTEROL 0.5-2.5 (3) MG/3ML IN SOLN
3.0000 mL | Freq: Once | RESPIRATORY_TRACT | Status: AC
  Administered 2023-06-07: 3 mL via RESPIRATORY_TRACT
  Filled 2023-06-07: qty 3

## 2023-06-07 MED ORDER — ONDANSETRON HCL 4 MG PO TABS: 4.0000 mg | ORAL_TABLET | Freq: Four times a day (QID) | ORAL | Status: DC | PRN

## 2023-06-07 MED ORDER — TRAZODONE HCL 50 MG PO TABS: 25.0000 mg | ORAL_TABLET | Freq: Every evening | ORAL | Status: DC | PRN

## 2023-06-07 MED ORDER — PREDNISONE 20 MG PO TABS
60.0000 mg | ORAL_TABLET | Freq: Once | ORAL | Status: AC
  Administered 2023-06-07: 60 mg via ORAL
  Filled 2023-06-07: qty 3

## 2023-06-07 MED ORDER — LORATADINE 10 MG PO TABS
10.0000 mg | ORAL_TABLET | Freq: Every day | ORAL | Status: DC
  Administered 2023-06-07 – 2023-06-08 (×2): 10 mg via ORAL
  Filled 2023-06-07 (×2): qty 1

## 2023-06-07 MED ORDER — GUAIFENESIN ER 600 MG PO TB12
1200.0000 mg | ORAL_TABLET | Freq: Two times a day (BID) | ORAL | Status: DC
  Administered 2023-06-07 – 2023-06-08 (×3): 1200 mg via ORAL
  Filled 2023-06-07 (×3): qty 2

## 2023-06-07 MED ORDER — ALBUTEROL SULFATE (2.5 MG/3ML) 0.083% IN NEBU
2.5000 mg | INHALATION_SOLUTION | RESPIRATORY_TRACT | Status: DC | PRN
  Administered 2023-06-07 – 2023-06-08 (×3): 2.5 mg via RESPIRATORY_TRACT
  Filled 2023-06-07 (×2): qty 3

## 2023-06-07 MED ORDER — ACETAMINOPHEN 325 MG PO TABS: 650.0000 mg | ORAL_TABLET | Freq: Four times a day (QID) | ORAL | Status: DC | PRN

## 2023-06-07 MED ORDER — FLUTICASONE FUROATE-VILANTEROL 100-25 MCG/ACT IN AEPB
1.0000 | INHALATION_SPRAY | Freq: Every day | RESPIRATORY_TRACT | Status: DC
  Administered 2023-06-08: 1 via RESPIRATORY_TRACT
  Filled 2023-06-07: qty 28

## 2023-06-07 MED ORDER — AMLODIPINE BESYLATE 5 MG PO TABS
5.0000 mg | ORAL_TABLET | Freq: Every day | ORAL | Status: DC
  Administered 2023-06-07 – 2023-06-08 (×2): 5 mg via ORAL
  Filled 2023-06-07 (×2): qty 1

## 2023-06-07 MED ORDER — ALBUTEROL SULFATE (2.5 MG/3ML) 0.083% IN NEBU
5.0000 mg/h | INHALATION_SOLUTION | Freq: Once | RESPIRATORY_TRACT | Status: DC
  Filled 2023-06-07: qty 3

## 2023-06-07 MED ORDER — ONDANSETRON HCL 4 MG/2ML IJ SOLN: 4.0000 mg | Freq: Four times a day (QID) | INTRAMUSCULAR | Status: DC | PRN

## 2023-06-07 MED ORDER — INSULIN ASPART 100 UNIT/ML IJ SOLN
0.0000 [IU] | Freq: Three times a day (TID) | INTRAMUSCULAR | Status: DC
  Administered 2023-06-07: 8 [IU] via SUBCUTANEOUS
  Administered 2023-06-08: 11 [IU] via SUBCUTANEOUS
  Filled 2023-06-07: qty 0.15

## 2023-06-07 MED ORDER — ACETAMINOPHEN 650 MG RE SUPP: 650.0000 mg | Freq: Four times a day (QID) | RECTAL | Status: DC | PRN

## 2023-06-07 MED ORDER — ENOXAPARIN SODIUM 40 MG/0.4ML IJ SOSY
40.0000 mg | PREFILLED_SYRINGE | INTRAMUSCULAR | Status: DC
  Administered 2023-06-07: 40 mg via SUBCUTANEOUS
  Filled 2023-06-07: qty 0.4

## 2023-06-07 MED ORDER — PREDNISONE 20 MG PO TABS
40.0000 mg | ORAL_TABLET | Freq: Every day | ORAL | Status: DC
  Administered 2023-06-08: 40 mg via ORAL
  Filled 2023-06-07: qty 2

## 2023-06-07 NOTE — H&P (Signed)
 History and Physical  Felicia Martin WUJ:[id number] DOB: 11-22-1932 DOA: 06/07/2023  PCP: Myrlene Broker, MD   Chief Complaint: Wheezing, shortness of breath  HPI: Felicia Martin is a 89 y.o. female with medical history significant for mild intermittent asthma, pulmonary fibrosis, sarcoidosis on room air at baseline, hypertension, hyperlipidemia being admitted to the hospital with wheezing, dyspnea with exertion likely due to acute exacerbation of asthma.  She states that for the last week or so, she has been feeling short of breath and wheezing.  She has a little bit of a cough, but mainly feels like she has sputum stuck in her throat that she cannot cough up.  She denies any fevers, chills or chest pain.  No nausea or vomiting.  She did use a Trelegy inhaler for a couple of months, but her insurance stopped paying for it.  Since that time, she has had increasing wheezing and dyspnea with exertion.  In the emergency department, she was given albuterol inhaler DuoNebs and is feeling better, she was also given a dose of oral prednisone.  She is saturating well on room air.  Due to continued diffuse rhonchi and wheezing, as well as some dyspnea with exertion, hospitalist admission was requested.  Review of Systems: Please see HPI for pertinent positives and negatives. A complete 10 system review of systems are otherwise negative.  Past Medical History:  Diagnosis Date   Asthma    GERD (gastroesophageal reflux disease)    Hypercholesterolemia    Hypertension    Pulmonary fibrosis (HCC)    Sarcoidosis    Surgical history of tubal ligation 1960s   Past Surgical History:  Procedure Laterality Date   COLONOSCOPY  12/09/14   ESOPHAGOGASTRODUODENOSCOPY  12/09/14   LUNG SURGERY  1960   Right lung   Maloney dilation  12/09/14   TUBAL LIGATION  1960   Social History:  reports that she quit smoking about 24 years ago. Her smoking use included cigarettes. She started smoking about 64  years ago. She has a 20 pack-year smoking history. She has never used smokeless tobacco. She reports that she does not drink alcohol and does not use drugs.  Allergies  Allergen Reactions   Fenofibrate Rash   Statins Other (See Comments)    Muscle aches and weakness   Zetia [Ezetimibe] Other (See Comments)    Muscle pain   Farxiga [Dapagliflozin] Swelling    Patient didn't like increase in urinary frequency and reported swelling    Family History  Problem Relation Age of Onset   Diabetes Sister        x's 3   Alzheimer's disease Sister    Liver cancer Mother    Liver cancer Father    Cancer Father    Cancer Brother    Diabetes Brother    Hypertension Sister        x's 3   Hyperlipidemia Other    Breast cancer Neg Hx      Prior to Admission medications   Medication Sig Start Date End Date Taking? Authorizing Provider  albuterol (PROVENTIL) (2.5 MG/3ML) 0.083% nebulizer solution Take 3 mLs (2.5 mg total) by nebulization every 2 (two) hours as needed for wheezing or shortness of breath. 12/15/22   Jonah Blue, MD  albuterol (VENTOLIN HFA) 108 (90 Base) MCG/ACT inhaler Inhale 2 puffs into the lungs every 6 (six) hours as needed. Patient taking differently: Inhale 2 puffs into the lungs every 6 (six) hours as needed for shortness of breath or  wheezing. 12/04/22   Myrlene Broker, MD  ALKA-SELTZER PLUS COLD 2-7.8-325 MG TBEF Take 1-2 tablets by mouth every 6 (six) hours as needed (for cold or flu-like symptoms and dissolve in water as directed).    [provider]  amLODipine (NORVASC) 5 MG tablet Take 1 tablet (5 mg total) by mouth daily. 12/04/22   Myrlene Broker, MD  benzonatate (TESSALON) 200 MG capsule Take 1 capsule (200 mg total) by mouth 3 (three) times daily as needed. Patient not taking: Reported on 06/04/2023 05/27/23   Myrlene Broker, MD  Blood Glucose Monitoring Suppl [facility] VERIO) w/Device KIT Use as directed once a day.  Dx code: E11.9  06/10/18   Zola Button, Grayling Congress, DO  cetirizine (ZYRTEC) 10 MG tablet Take 1 tablet (10 mg total) by mouth daily. 06/04/23   Myrlene Broker, MD  fluticasone Aleda Grana) 50 MCG/ACT nasal spray Place 2 sprays into both nostrils daily. 06/04/23   Myrlene Broker, MD  Fluticasone-Umeclidin-[facility] ELLIPTA) 100-62.5-25 MCG/ACT AEPB INHALE 1 PUFF INTO THE LUNGS DAILY 04/29/23   Myrlene Broker, MD  glucose blood [facility] VERIO) test strip USE TO CHECK BLOOD GLUCOSE ONCE DAILY 12/31/22   Myrlene Broker, MD  guaiFENesin (MUCINEX) 600 MG 12 hr tablet Take 2 tablets (1,200 mg total) by mouth 2 (two) times daily. 12/15/22   Jonah Blue, MD  Lancets [facility] DELICA PLUS Collier Bullock) MISC USE ONCE A DAY.  DX CODE: E11.9 12/31/22   Myrlene Broker, MD  Metamucil Fiber CHEW Chew 1 tablet by mouth daily as needed (for constipation).    [provider]  metFORMIN (GLUCOPHAGE-XR) 500 MG 24 hr tablet Take 1 tablet (500 mg total) by mouth daily with breakfast. 12/13/22   Myrlene Broker, MD    Physical Exam: BP (!) 175/73 (BP Location: Left Arm)   Pulse 85   Temp 97.8 F (36.6 C) (Oral)   Resp 18   Ht 5' (1.524 m)   Wt 89.4 kg   LMP 03/17/2014   SpO2 93%   BMI 38.47 kg/m  General:  Alert, oriented, calm, in no acute distress, but mildly tachypneic on room air.  Able to speak in full sentences.  No cough.  Overall she looks nontoxic and generally comfortable.  Her daughter is at the bedside. Cardiovascular: RRR, no murmurs or rubs, no peripheral edema  Respiratory: clear to auscultation bilaterally, no wheezes, no crackles  Abdomen: soft, nontender, nondistended, normal bowel tones heard  Skin: dry, no rashes  Musculoskeletal: no joint effusions, normal range of motion  Psychiatric: appropriate affect, normal speech  Neurologic: extraocular muscles intact, clear speech, moving all extremities with intact sensorium         Labs on Admission:  Basic  Metabolic Panel: Recent Labs  Lab 06/07/23 1053  NA 138  K 3.4*  CL 103  CO2 25  GLUCOSE 128*  BUN 17  CREATININE 0.74  CALCIUM 9.5   Liver Function Tests: No results for input(s): "AST", "ALT", "ALKPHOS", "BILITOT", "PROT", "ALBUMIN" in the last 168 hours. No results for input(s): "LIPASE", "AMYLASE" in the last 168 hours. No results for input(s): "AMMONIA" in the last 168 hours. CBC: Recent Labs  Lab 06/07/23 1053  WBC 5.4  NEUTROABS 2.9  HGB 12.0  HCT 37.5  MCV 93.5  PLT 320   Cardiac Enzymes: No results for input(s): "CKTOTAL", "CKMB", "CKMBINDEX", "TROPONINI" in the last 168 hours. BNP (last 3 results) Recent Labs    12/14/22 1420  BNP 41.3    ProBNP (last 3 results) No results for input(s): "PROBNP" in the last 8760 hours.  CBG: No results for input(s): "GLUCAP" in the last 168 hours.  Radiological Exams on Admission: DG Chest 2 View Result Date: 06/07/2023 CLINICAL DATA:  Shortness of breath, cough, and wheezing EXAM: CHEST - 2 VIEW COMPARISON:  Chest radiograph dated 01/18/2023, CTA chest dated 03/22/2022 FINDINGS: Normal lung volumes. Increased conspicuity of right upper lung reticulations. Patchy bilateral lower lobe opacities. No pleural effusion or pneumothorax. The heart size and mediastinal contours are within normal limits. No acute osseous abnormality. IMPRESSION: 1. Patchy bilateral lower lobe opacities, which may represent atelectasis, aspiration, or pneumonia. 2. Increased conspicuity of right upper lung fibrotic changes. Electronically Signed   By: Agustin Cree M.D.   On: 06/07/2023 10:53   Assessment/Plan Felicia Martin is a 89 y.o. female with medical history significant for mild intermittent asthma, pulmonary fibrosis, sarcoidosis on room air at baseline, hypertension, hyperlipidemia being admitted to the hospital with wheezing, dyspnea with exertion likely due to acute exacerbation of asthma.  Asthma exacerbation-with increased nonproductive  cough, wheezing and rhonchi.  No fevers, leukocytosis or chills or other evidence of acute bacterial infection. -Observation admission -Incentive spirometer and flutter valve -Scheduled DuoNebs, with as needed albuterol -Continue daily Claritin -Prednisone 40 mg p.o. daily -Supposed to be on Trelegy at home, will resume equivalent Breo and Incruse -TOC consult to assist with medication at home  Type 2 diabetes-carb modified diet, with moderate dose sliding scale.  Hold home metformin  Hypertension-continue home Norvasc  DVT prophylaxis: Lovenox     Code Status: Full Code  Consults called: None  Admission status: Observation  Time spent: 48 minutes  Laconda Basich Sharlette Dense MD [facility] Pager [phone]  If 7PM-7AM, please contact night-coverage [email or web address] Password [email or web address]  06/07/2023, 2:20 PM

## 2023-06-07 NOTE — ED Triage Notes (Signed)
 Patient presented to ER with shortness of breath, coughing, wheezing. Patient states she feels like she needs to cough stuff up but gets stuck in throat.

## 2023-06-07 NOTE — ED Provider Notes (Signed)
 [place] [facility] AT [facility] Provider Note   CSN: [id number] Arrival date & time: 06/07/23  1000     History  Chief Complaint  Patient presents with   Shortness of Breath    Felicia Martin is a 89 y.o. female, history of pulmonary fibrosis, sarcoidosis, asthma, type 2 diabetes, who presents to the ED secondary to shortness of breath and wheezing this been going on for the last 7 to 10 days.  She states that she has been using her nebulizer, as well as her albuterol inhaler, without any relief.  Reports that she has not been taking her Trelegy, as she cannot afford it currently.  Notes that she has not had any runny nose, cough, sore throat, but feels like feeling stuck in her throat, like a big ball of mucus.  She denies any chest pain, or recent illnesses, or sick contacts.  Went to her doctor earlier this week, and states that was told everything was fine and continue taking her albuterol inhaler, and nebulizers but has not found any relief.  Home Medications Prior to Admission medications   Medication Sig Start Date End Date Taking? Authorizing Provider  albuterol (PROVENTIL) (2.5 MG/3ML) 0.083% nebulizer solution Take 3 mLs (2.5 mg total) by nebulization every 2 (two) hours as needed for wheezing or shortness of breath. 12/15/22   Jonah Blue, MD  albuterol (VENTOLIN HFA) 108 (90 Base) MCG/ACT inhaler Inhale 2 puffs into the lungs every 6 (six) hours as needed. Patient taking differently: Inhale 2 puffs into the lungs every 6 (six) hours as needed for shortness of breath or wheezing. 12/04/22   Myrlene Broker, MD  ALKA-SELTZER PLUS COLD 2-7.8-325 MG TBEF Take 1-2 tablets by mouth every 6 (six) hours as needed (for cold or flu-like symptoms and dissolve in water as directed).    [provider]  amLODipine (NORVASC) 5 MG tablet Take 1 tablet (5 mg total) by mouth daily. 12/04/22   Myrlene Broker, MD  benzonatate (TESSALON) 200 MG  capsule Take 1 capsule (200 mg total) by mouth 3 (three) times daily as needed. Patient not taking: Reported on 06/04/2023 05/27/23   Myrlene Broker, MD  Blood Glucose Monitoring Suppl [facility] VERIO) w/Device KIT Use as directed once a day.  Dx code: E11.9 06/10/18   Zola Button, Grayling Congress, DO  cetirizine (ZYRTEC) 10 MG tablet Take 1 tablet (10 mg total) by mouth daily. 06/04/23   Myrlene Broker, MD  fluticasone Aleda Grana) 50 MCG/ACT nasal spray Place 2 sprays into both nostrils daily. 06/04/23   Myrlene Broker, MD  Fluticasone-Umeclidin-Vilant [facility] ELLIPTA) 100-62.5-25 MCG/ACT AEPB INHALE 1 PUFF INTO THE LUNGS DAILY 04/29/23   Myrlene Broker, MD  glucose blood [facility] VERIO) test strip USE TO CHECK BLOOD GLUCOSE ONCE DAILY 12/31/22   Myrlene Broker, MD  guaiFENesin (MUCINEX) 600 MG 12 hr tablet Take 2 tablets (1,200 mg total) by mouth 2 (two) times daily. 12/15/22   Jonah Blue, MD  Lancets [facility] DELICA PLUS Collier Bullock) MISC USE ONCE A DAY.  DX CODE: E11.9 12/31/22   Myrlene Broker, MD  Metamucil Fiber CHEW Chew 1 tablet by mouth daily as needed (for constipation).    [provider]  metFORMIN (GLUCOPHAGE-XR) 500 MG 24 hr tablet Take 1 tablet (500 mg total) by mouth daily with breakfast. 12/13/22   Myrlene Broker, MD      Allergies    Fenofibrate, Statins, Zetia [ezetimibe], and Farxiga [dapagliflozin]  Review of Systems   Review of Systems  Respiratory:  Positive for shortness of breath and wheezing.   Cardiovascular:  Negative for chest pain.    Physical Exam Updated Vital Signs BP (!) 175/73 (BP Location: Left Arm)   Pulse 85   Temp 97.8 F (36.6 C) (Oral)   Resp 18   Ht 5' (1.524 m)   Wt 89.4 kg   LMP 03/17/2014   SpO2 93%   BMI 38.47 kg/m  Physical Exam Vitals and nursing note reviewed.  Constitutional:      General: She is not in acute distress.    Appearance: She is well-developed.  HENT:     Head:  Normocephalic and atraumatic.  Eyes:     Conjunctiva/sclera: Conjunctivae normal.  Cardiovascular:     Rate and Rhythm: Normal rate and regular rhythm.     Heart sounds: No murmur heard. Pulmonary:     Effort: Pulmonary effort is normal. No respiratory distress.     Breath sounds: Wheezing present.     Comments: Nonlabored respirations, audible wheezing Abdominal:     Palpations: Abdomen is soft.     Tenderness: There is no abdominal tenderness.  Musculoskeletal:        General: No swelling.     Cervical back: Neck supple.  Skin:    General: Skin is warm and dry.     Capillary Refill: Capillary refill takes less than 2 seconds.  Neurological:     Mental Status: She is alert.  Psychiatric:        Mood and Affect: Mood normal.     ED Results / Procedures / Treatments   Labs (all labs ordered are listed, but only abnormal results are displayed) Labs Reviewed  BASIC METABOLIC PANEL - Abnormal; Notable for the following components:      Result Value   Potassium 3.4 (*)    Glucose, Bld 128 (*)    All other components within normal limits  CBC WITH DIFFERENTIAL/PLATELET - Abnormal; Notable for the following components:   Eosinophils Absolute 0.6 (*)    All other components within normal limits  RESP PANEL BY RT-PCR (RSV, FLU A&B, COVID)  [id number]  BRAIN NATRIURETIC PEPTIDE  TROPONIN I (HIGH SENSITIVITY)    EKG EKG Interpretation Date/Time:  Friday June 07 2023 10:12:29 EDT Ventricular Rate:  84 PR Interval:  164 QRS Duration:  78 QT Interval:  369 QTC Calculation: 437 R Axis:   72  Text Interpretation: Sinus rhythm Borderline repolarization abnormality No significant change since last tracing similar to EKG 03/22/22 Confirmed by Gwyneth Sprout ([id number]) on 06/07/2023 10:33:25 AM  Radiology DG Chest 2 View Result Date: 06/07/2023 CLINICAL DATA:  Shortness of breath, cough, and wheezing EXAM: CHEST - 2 VIEW COMPARISON:  Chest radiograph dated 01/18/2023, CTA chest dated  03/22/2022 FINDINGS: Normal lung volumes. Increased conspicuity of right upper lung reticulations. Patchy bilateral lower lobe opacities. No pleural effusion or pneumothorax. The heart size and mediastinal contours are within normal limits. No acute osseous abnormality. IMPRESSION: 1. Patchy bilateral lower lobe opacities, which may represent atelectasis, aspiration, or pneumonia. 2. Increased conspicuity of right upper lung fibrotic changes. Electronically Signed   By: Agustin Cree M.D.   On: 06/07/2023 10:53    Procedures Procedures    Medications Ordered in ED Medications  metFORMIN (GLUCOPHAGE-XR) 24 hr tablet 500 mg (has no administration in time range)  amLODipine (NORVASC) tablet 5 mg (has no administration in time range)  fluticasone furoate-vilanterol (BREO ELLIPTA) 100-25 MCG/ACT 1  puff (has no administration in time range)    And  umeclidinium bromide (INCRUSE ELLIPTA) 62.5 MCG/ACT 1 puff (has no administration in time range)  guaiFENesin (MUCINEX) 12 hr tablet 1,200 mg (has no administration in time range)  loratadine (CLARITIN) tablet 10 mg (has no administration in time range)  enoxaparin (LOVENOX) injection 40 mg (has no administration in time range)  insulin aspart (novoLOG) injection 0-15 Units (has no administration in time range)  insulin aspart (novoLOG) injection 0-5 Units (has no administration in time range)  acetaminophen (TYLENOL) tablet 650 mg (has no administration in time range)    Or  acetaminophen (TYLENOL) suppository 650 mg (has no administration in time range)  traZODone (DESYREL) tablet 25 mg (has no administration in time range)  ondansetron (ZOFRAN) tablet 4 mg (has no administration in time range)    Or  ondansetron (ZOFRAN) injection 4 mg (has no administration in time range)  albuterol (PROVENTIL) (2.5 MG/3ML) 0.083% nebulizer solution 2.5 mg (has no administration in time range)  predniSONE (DELTASONE) tablet 40 mg (has no administration in time range)   ipratropium-albuterol (DUONEB) 0.5-2.5 (3) MG/3ML nebulizer solution 3 mL (3 mLs Nebulization Given 06/07/23 1138)  predniSONE (DELTASONE) tablet 60 mg (60 mg Oral Given 06/07/23 1137)    ED Course/ Medical Decision Making/ A&P                                 Medical Decision Making Patient is a-year-old female, here for shortness of breath, cough has been going on for the last 7 to 10 days.  Has been started on antihistamines without any improvement.  She is diffusely wheezy, with rhonchi.  Audible expiratory wheeze.  We will start her on DuoNeb, steroid, and obtain chest x-ray, staff with Dr. Anitra Lauth, she had a troponin, BNP, to further evaluate etiology of shortness of breath.  She has nonlabored respirations, just diffusely wheezy and rhonchorous on exam  Amount and/or Complexity of Data Reviewed Labs: ordered.    Details: Blood work unremarkable, COVID/flu unremarkable, troponin negative Radiology: ordered.    Details: Chest x-ray shows patchy bilateral lower lobe opacities Discussion of management or test interpretation with external provider(s): Patient persistently wheezy after DuoNeb, prednisone, she has persistent wheezing and rhonchi, only trace edema, I believe that her symptoms are likely secondary to an asthma exacerbation, being undertreated.  She has not been able to afford her Trelegy, thus I think she has been undertreated for her asthma, and that is causing this acute exacerbation.  Her symptoms do not align with pneumonia, especially given that it is bilateral, and she is afebrile, well-appearing, and has not had any upper respiratory infectious symptoms.  I admitted to Dr. Kirby Crigler, for treatment of asthma exacerbation and further evaluation.  Risk Prescription drug management. Decision regarding hospitalization.   Final Clinical Impression(s) / ED Diagnoses Final diagnoses:  Exacerbation of asthma, unspecified asthma severity, unspecified whether persistent    Rx  / DC Orders ED Discharge Orders     None         Rolfe Hartsell, Harley Alto, PA 06/07/23 1424    Gwyneth Sprout, MD 06/08/23 1749

## 2023-06-08 DIAGNOSIS — J4541 Moderate persistent asthma with (acute) exacerbation: Secondary | ICD-10-CM | POA: Diagnosis not present

## 2023-06-08 LAB — GLUCOSE, CAPILLARY
Glucose-Capillary: 117 mg/dL — ABNORMAL HIGH (ref 70–99)
Glucose-Capillary: 306 mg/dL — ABNORMAL HIGH (ref 70–99)

## 2023-06-08 MED ORDER — PREDNISONE 20 MG PO TABS: ORAL_TABLET | ORAL | 0 refills | Status: AC

## 2023-06-08 MED ORDER — GUAIFENESIN-DM 100-10 MG/5ML PO SYRP
5.0000 mL | ORAL_SOLUTION | ORAL | Status: DC | PRN
  Administered 2023-06-08: 5 mL via ORAL
  Filled 2023-06-08: qty 5

## 2023-06-08 NOTE — Plan of Care (Signed)
?  Problem: Clinical Measurements: ?Goal: Respiratory complications will improve ?Outcome: Progressing ?  ?Problem: Coping: ?Goal: Level of anxiety will decrease ?Outcome: Progressing ?  ?Problem: Elimination: ?Goal: Will not experience complications related to bowel motility ?Outcome: Progressing ?  ?

## 2023-06-08 NOTE — Care Management Obs Status (Signed)
 MEDICARE OBSERVATION STATUS NOTIFICATION   Patient Details  Name: Felicia Martin MRN: [id number] Date of Birth: 1933-02-26   Medicare Observation Status Notification Given:  Yes    Diona Browner, LCSW 06/08/2023, 11:24 AM

## 2023-06-08 NOTE — Plan of Care (Signed)
   Problem: Education: Goal: Ability to describe self-care measures that may prevent or decrease complications (Diabetes Survival Skills Education) will improve Outcome: Progressing Goal: Individualized Educational Video(s) Outcome: Progressing

## 2023-06-08 NOTE — Progress Notes (Signed)
 Pt discharge instructions read and reviewed. Pt verbalized understanding. Pt escorted via wheelchair to main entrance to daughter's personal vehicle.

## 2023-06-08 NOTE — Discharge Summary (Signed)
 Physician Discharge Summary   Patient: Felicia Martin MRN: [id number] DOB: [birth year]  Admit date:     06/07/2023  Discharge date: 06/08/23  Discharge Physician: Debarah Crape   PCP: Myrlene Broker, MD   Recommendations at discharge:   Follow-up closely with pulmonology for lung disease management.  Pursue prior authorization via pulmonology for Trelegy  Discharge Diagnoses: Principal Problem:   Asthma exacerbation  Resolved Problems:   * No resolved hospital problems. [facility] Course: Felicia Martin is an 89 year old female with history of mild remittent asthma, pulmonary fibrosis, sarcoidosis, hypertension, hyperlipidemia, who was admitted to the hospital with wheezing and dyspnea thought to be secondary to acute asthma exacerbation.  On arrival to the ED she was comfortable on room air but had cough, sputum production, and increased wheezing with exertion.  She reports she previously was having success with Trelegy Ellipta but has been having difficulties with insurance coverage.  In the ED she was given albuterol and a dose of prednisone.  She continued to saturate well on room air.  By evaluation on 3/22 patient was maintaining O2 sats above 95%on room air, and appeared comfortable.  She had no further wheezing on evaluation.  She will be sent home today with a prednisone taper, incentive spirometry, flutter valve.  She was instructed on the importance of use.  She will need to follow-up closely with her pulmonologist outpatient.  Assessment and Plan: Asthma exacerbation Pulmonary fibrosis Sarcoidosis - No further wheezing on exam - Continue with prednisone taper - Encouraged incentive spirometry and flutter valve use - As needed albuterol  Type 2 diabetes - Continue carb modified diet - Resume metformin at discharge  Hypertension Hyperlipidemia - Resume home meds     Diet recommendation:  Discharge Diet Orders (From admission, onward)     Start     Ordered    06/08/23 0000  Diet general        06/08/23 1402            DISCHARGE MEDICATION: Allergies as of 06/08/2023       Reactions   Statins Other (See Comments)   Muscle aches and weakness   Zetia [ezetimibe] Other (See Comments)   Muscle pain        Medication List     TAKE these medications    albuterol 108 (90 Base) MCG/ACT inhaler Commonly known as: VENTOLIN HFA Inhale 2 puffs into the lungs every 6 (six) hours as needed. What changed:  when to take this reasons to take this   albuterol (2.5 MG/3ML) 0.083% nebulizer solution Commonly known as: PROVENTIL Take 3 mLs (2.5 mg total) by nebulization every 2 (two) hours as needed for wheezing or shortness of breath. What changed: Another medication with the same name was changed. Make sure you understand how and when to take each.   amLODipine 5 MG tablet Commonly known as: NORVASC Take 1 tablet (5 mg total) by mouth daily.   benzonatate 200 MG capsule Commonly known as: TESSALON Take 1 capsule (200 mg total) by mouth 3 (three) times daily as needed. What changed: reasons to take this   cetirizine 10 MG tablet Commonly known as: ZYRTEC Take 1 tablet (10 mg total) by mouth daily.   fluticasone 50 MCG/ACT nasal spray Commonly known as: FLONASE Place 2 sprays into both nostrils daily. What changed:  when to take this reasons to take this   guaiFENesin 600 MG 12 hr tablet Commonly known as: MUCINEX Take 2 tablets (1,200 mg  total) by mouth 2 (two) times daily.   Metamucil Fiber Chew Chew 1 tablet by mouth daily as needed (for constipation).   metFORMIN 500 MG 24 hr tablet Commonly known as: GLUCOPHAGE-XR Take 1 tablet (500 mg total) by mouth daily with breakfast.   predniSONE 20 MG tablet Commonly known as: DELTASONE Take 2 tablets (40 mg total) by mouth daily with breakfast for 2 days, THEN 1 tablet (20 mg total) daily with breakfast for 2 days, THEN 0.5 tablets (10 mg total) daily with breakfast for 2  days. Start taking on: June 08, 2023   Trelegy Ellipta 100-62.5-25 MCG/ACT Aepb Generic drug: Fluticasone-Umeclidin-Vilant INHALE 1 PUFF INTO THE LUNGS DAILY        Discharge Exam: Filed Weights   06/07/23 1006  Weight: 89.4 kg   Constitutional:  Normal appearance. Non toxic-appearing.  HENT: Head Normocephalic and atraumatic.  Mucous membranes are moist.  Eyes:  Extraocular intact. Conjunctivae normal. Pupils are equal, round, and reactive to light.  Cardiovascular: Rate and Rhythm: Normal rate and regular rhythm.  Pulmonary: Non labored, symmetric rise of chest wall, productive cough, no wheezing, some upper airway sounds. Musculoskeletal:  Normal range of motion.  Skin: warm and dry. not jaundiced.  Neurological: No focal deficit present. alert. Oriented. Psychiatric: Mood and Affect congruent.    Condition at discharge: stable  The results of significant diagnostics from this hospitalization (including imaging, microbiology, ancillary and laboratory) are listed below for reference.   Imaging Studies: DG Chest 2 View Result Date: 06/07/2023 CLINICAL DATA:  Shortness of breath, cough, and wheezing EXAM: CHEST - 2 VIEW COMPARISON:  Chest radiograph dated 01/18/2023, CTA chest dated 03/22/2022 FINDINGS: Normal lung volumes. Increased conspicuity of right upper lung reticulations. Patchy bilateral lower lobe opacities. No pleural effusion or pneumothorax. The heart size and mediastinal contours are within normal limits. No acute osseous abnormality. IMPRESSION: 1. Patchy bilateral lower lobe opacities, which may represent atelectasis, aspiration, or pneumonia. 2. Increased conspicuity of right upper lung fibrotic changes. Electronically Signed   By: Agustin Cree M.D.   On: 06/07/2023 10:53    Microbiology: Results for orders placed or performed during the hospital encounter of 06/07/23  Resp panel by RT-PCR (RSV, Flu A&B, Covid) Anterior Nasal Swab     Status: None   Collection  Time: 06/07/23 10:08 AM   Specimen: Anterior Nasal Swab  Result Value Ref Range Status   SARS Coronavirus 2 by RT PCR NEGATIVE NEGATIVE Final    Comment: (NOTE) SARS-CoV-2 target nucleic acids are NOT DETECTED.  The SARS-CoV-2 RNA is generally detectable in upper respiratory specimens during the acute phase of infection. The lowest concentration of SARS-CoV-2 viral copies this assay can detect is 138 copies/mL. A negative result does not preclude SARS-Cov-2 infection and should not be used as the sole basis for treatment or other patient management decisions. A negative result may occur with  improper specimen collection/handling, submission of specimen other than nasopharyngeal swab, presence of viral mutation(s) within the areas targeted by this assay, and inadequate number of viral copies(<138 copies/mL). A negative result must be combined with clinical observations, patient history, and epidemiological information. The expected result is Negative.  Fact Sheet for Patients:  BloggerCourse.com  Fact Sheet for Healthcare Providers:  SeriousBroker.it  This test is no t yet approved or cleared by the [place] FDA and  has been authorized for detection and/or diagnosis of SARS-CoV-2 by FDA under an Emergency Use Authorization (EUA). This EUA will remain  in effect (meaning  this test can be used) for the duration of the COVID-19 declaration under Section 564(b)(1) of the Act, 21 U.S.C.section 360bbb-3(b)(1), unless the authorization is terminated  or revoked sooner.       Influenza A by PCR NEGATIVE NEGATIVE Final   Influenza B by PCR NEGATIVE NEGATIVE Final    Comment: (NOTE) The Xpert Xpress SARS-CoV-2/FLU/RSV plus assay is intended as an aid in the diagnosis of influenza from Nasopharyngeal swab specimens and should not be used as a sole basis for treatment. Nasal washings and aspirates are unacceptable for Xpert Xpress  SARS-CoV-2/FLU/RSV testing.  Fact Sheet for Patients: BloggerCourse.com  Fact Sheet for Healthcare Providers: SeriousBroker.it  This test is not yet approved or cleared by the [place] FDA and has been authorized for detection and/or diagnosis of SARS-CoV-2 by FDA under an Emergency Use Authorization (EUA). This EUA will remain in effect (meaning this test can be used) for the duration of the COVID-19 declaration under Section 564(b)(1) of the Act, 21 U.S.C. section 360bbb-3(b)(1), unless the authorization is terminated or revoked.     Resp Syncytial Virus by PCR NEGATIVE NEGATIVE Final    Comment: (NOTE) Fact Sheet for Patients: BloggerCourse.com  Fact Sheet for Healthcare Providers: SeriousBroker.it  This test is not yet approved or cleared by the [place] FDA and has been authorized for detection and/or diagnosis of SARS-CoV-2 by FDA under an Emergency Use Authorization (EUA). This EUA will remain in effect (meaning this test can be used) for the duration of the COVID-19 declaration under Section 564(b)(1) of the Act, 21 U.S.C. section 360bbb-3(b)(1), unless the authorization is terminated or revoked.  Performed at [facility], [address] [place], [place] [address]     Labs: CBC: Recent Labs  Lab 06/07/23 1053  WBC 5.4  NEUTROABS 2.9  HGB 12.0  HCT 37.5  MCV 93.5  PLT 320   Basic Metabolic Panel: Recent Labs  Lab 06/07/23 1053  NA 138  K 3.4*  CL 103  CO2 25  GLUCOSE 128*  BUN 17  CREATININE 0.74  CALCIUM 9.5   Liver Function Tests: No results for input(s): "AST", "ALT", "ALKPHOS", "BILITOT", "PROT", "ALBUMIN" in the last 168 hours. CBG: Recent Labs  Lab 06/07/23 1645 06/07/23 2110 06/08/23 0746 06/08/23 1141  GLUCAP 264* 282* 117* 306*    Discharge time spent: 32 minutes.  Signed: Debarah Crape,  DO [facility] 06/08/2023

## 2023-06-08 NOTE — Hospital Course (Signed)
 Felicia Martin is an 89 year old female with history of mild remittent asthma, pulmonary fibrosis, sarcoidosis, hypertension, hyperlipidemia, who was admitted to the hospital with wheezing and dyspnea thought to be secondary to acute asthma exacerbation.  On arrival to the ED she was comfortable on room air but had cough, sputum production, and increased wheezing with exertion.  She reports she previously was having success with Trelegy Ellipta but has been having difficulties with insurance coverage.  In the ED she was given albuterol and a dose of prednisone.  She continued to saturate well on room air.  By evaluation on 3/22 patient was maintaining O2 sats above 95%on room air, and appeared comfortable.  She had no further wheezing on evaluation.  She will be sent home today with a prednisone taper, incentive spirometry, flutter valve.  She was instructed on the importance of use.  She will need to follow-up closely with her pulmonologist outpatient.

## 2023-06-20 ENCOUNTER — Other Ambulatory Visit

## 2023-06-20 ENCOUNTER — Other Ambulatory Visit: Admitting: Pharmacist

## 2023-06-20 DIAGNOSIS — J45901 Unspecified asthma with (acute) exacerbation: Secondary | ICD-10-CM

## 2023-06-20 NOTE — Progress Notes (Signed)
 06/20/2023 Name: Felicia Martin MRN: [id number] DOB: 1932-12-29  Chief Complaint  Patient presents with   Medication Access   Asthma    Labrittany MALKIE Martin is a 89 y.o. year old female who presented for a telephone visit.   They were referred to the pharmacist by their PCP for assistance in managing medication access.   Subjective:  Care Team: Primary Care Provider: Myrlene Broker, MD ; Next Scheduled Visit: 06/24/23   Medication Access/Adherence  Current Pharmacy:  [id number] [place], [place] - [address] AT [facility] [address] [place] [address] Phone: [phone] Fax: [phone]   Patient reports affordability concerns with their medications: Yes  Patient reports access/transportation concerns to their pharmacy: No  Patient reports adherence concerns with their medications:  Yes    She has not been taking Trelegy because she has been unable to afford. It was $600+ at the pharmacy.  Recently discharged from hospital for asthma exacerbation. She notes she is feeling better since discharge.   Objective:  Lab Results  Component Value Date   HGBA1C 6.9 05/27/2023    Lab Results  Component Value Date   CREATININE 0.74 06/07/2023   BUN 17 06/07/2023   NA 138 06/07/2023   K 3.4 (L) 06/07/2023   CL 103 06/07/2023   CO2 25 06/07/2023    Lab Results  Component Value Date   CHOL 293 (H) 12/04/2022   HDL 53.00 12/04/2022   LDLCALC 210 (H) 12/04/2022   LDLDIRECT 230.7 03/18/2013   TRIG 148.0 12/04/2022   CHOLHDL 6 12/04/2022    Medications Reviewed Today     Reviewed by Bonita Quin, RPH (Pharmacist) on 06/20/23 at 1622  Med List Status: <None>   Medication Order Taking? Sig Documenting Provider Last Dose Status Informant  albuterol (PROVENTIL) (2.5 MG/3ML) 0.083% nebulizer solution [id number]  Take 3 mLs (2.5 mg total) by nebulization every 2 (two) hours as needed for wheezing  or shortness of breath. Jonah Blue, MD  Active Self, Pharmacy Records, Multiple Informants  albuterol (VENTOLIN HFA) 108 (90 Base) MCG/ACT inhaler [id number]  Inhale 2 puffs into the lungs every 6 (six) hours as needed.  Patient taking differently: Inhale 2 puffs into the lungs every 4 (four) hours as needed for shortness of breath or wheezing.   Myrlene Broker, MD  Active Self, Pharmacy Records, Multiple Informants  amLODipine (NORVASC) 5 MG tablet [id number] Yes Take 1 tablet (5 mg total) by mouth daily. Myrlene Broker, MD Taking Active Self, Pharmacy Records, Multiple Informants  benzonatate (TESSALON) 200 MG capsule [id number]  Take 1 capsule (200 mg total) by mouth 3 (three) times daily as needed.  Patient taking differently: Take 200 mg by mouth 3 (three) times daily as needed for cough.   Myrlene Broker, MD  Active Self, Pharmacy Records, Multiple Informants  cetirizine (ZYRTEC) 10 MG tablet [id number]  Take 1 tablet (10 mg total) by mouth daily.  Patient not taking: Reported on 06/07/2023   Myrlene Broker, MD  Active Self, Pharmacy Records, Multiple Informants  fluticasone [facility]) 50 MCG/ACT nasal spray [id number]  Place 2 sprays into both nostrils daily.  Patient taking differently: Place 2 sprays into both nostrils daily as needed for allergies or rhinitis.   Myrlene Broker, MD  Active Self, Pharmacy Records, Multiple Informants  Fluticasone-Umeclidin-[facility] ELLIPTA) 100-62.5-25 MCG/ACT AEPB [id number] No INHALE 1 PUFF INTO THE LUNGS DAILY  Patient  not taking: Reported on 06/07/2023   Myrlene Broker, MD Not Taking Active Self, Pharmacy Records, Multiple Informants  guaiFENesin (MUCINEX) 600 MG 12 hr tablet [id number]  Take 2 tablets (1,200 mg total) by mouth 2 (two) times daily.  Patient not taking: Reported on 06/07/2023   Jonah Blue, MD  Active Self, Pharmacy Records, Multiple Informants  Metamucil Fiber CHEW [id number]  Chew 1  tablet by mouth daily as needed (for constipation). [provider]  Active Self, Pharmacy Records, Multiple Informants  metFORMIN (GLUCOPHAGE-XR) 500 MG 24 hr tablet [id number] Yes Take 1 tablet (500 mg total) by mouth daily with breakfast. Myrlene Broker, MD Taking Active Self, Pharmacy Records, Multiple Informants              Assessment/Plan:   Med access: Pt has a $570 drug deductible. Once that it paid, the Trelegy cost would be $47 per 30 DS. Pt is unable to afford the deductible cost. Trelegy PAP requires $600 OOP to be met before approval.  She qualifies for AZ&Me PAP for Breztri. Filled out application, will have her sign when she is in office on 4/7.   Follow Up Plan: 4/7  Arbutus Leas, PharmD, BCPS, CPP Clinical Pharmacist Practitioner [facility] [phone]

## 2023-06-20 NOTE — Patient Instructions (Signed)
 It was a pleasure speaking with you today!  I will fill out the patient assistance application for you for Breztri inhaler (to replace Trelegy). I wil have it for you on 4/7 when you come for your appointment with Dr. Okey Dupre.  Feel free to call with any questions or concerns!  Arbutus Leas, PharmD, BCPS, CPP Clinical Pharmacist Practitioner [facility] [phone]

## 2023-06-24 ENCOUNTER — Ambulatory Visit (INDEPENDENT_AMBULATORY_CARE_PROVIDER_SITE_OTHER): Admitting: Internal Medicine

## 2023-06-24 ENCOUNTER — Encounter: Payer: Self-pay | Admitting: Internal Medicine

## 2023-06-24 VITALS — BP 138/80 | HR 91 | Temp 98.2°F | Ht 60.0 in | Wt 196.0 lb

## 2023-06-24 DIAGNOSIS — I10 Essential (primary) hypertension: Secondary | ICD-10-CM | POA: Diagnosis not present

## 2023-06-24 DIAGNOSIS — J45901 Unspecified asthma with (acute) exacerbation: Secondary | ICD-10-CM

## 2023-06-24 MED ORDER — HYDROCHLOROTHIAZIDE 25 MG PO TABS: 25.0000 mg | ORAL_TABLET | Freq: Every day | ORAL | 3 refills | Status: AC

## 2023-06-24 NOTE — Assessment & Plan Note (Signed)
 Was unable to afford trelegy and ended up in hospital. She signed assistance form for breztri today and we will submit that on her behalf and was given a sample of breztri to try. Her cough is improved and lung exam is stable to prior.

## 2023-06-24 NOTE — Patient Instructions (Addendum)
 We will work on the breztri inhaler application to help get you for this.  I have sent back in the hydrochlorothiazide which is the fluid pill to help with that to take daily.

## 2023-06-24 NOTE — Assessment & Plan Note (Signed)
 Somehow during hospital visits her hydrochlorothiazide 25 mg daily has fallen off med list and this helps with swelling. BP was normal on this previously so refilled today and will continue amlodipine 5 mg daily as well.

## 2023-06-24 NOTE — Progress Notes (Signed)
   Subjective:   Patient ID: Felicia Martin, female    DOB: December 21, 1932, 89 y.o.   MRN: [id number]  HPI The patient is an 89 YO female coming in for hospital follow up (was admitted due to SOB after being unable to afford trelegy). She has talked to pharmacist since discharge and is working to get assistance for breztri.   Review of Systems  Constitutional: Negative.   HENT:  Positive for voice change.   Eyes: Negative.   Respiratory:  Positive for cough. Negative for chest tightness and shortness of breath.        Improving  Cardiovascular:  Negative for chest pain, palpitations and leg swelling.  Gastrointestinal:  Negative for abdominal distention, abdominal pain, constipation, diarrhea, nausea and vomiting.  Musculoskeletal: Negative.   Skin: Negative.   Neurological: Negative.   Psychiatric/Behavioral: Negative.      Objective:  Physical Exam Constitutional:      Appearance: She is well-developed.  HENT:     Head: Normocephalic and atraumatic.  Cardiovascular:     Rate and Rhythm: Normal rate and regular rhythm.  Pulmonary:     Effort: Pulmonary effort is normal. No respiratory distress.     Breath sounds: Normal breath sounds. No wheezing or rales.  Abdominal:     General: Bowel sounds are normal. There is no distension.     Palpations: Abdomen is soft.     Tenderness: There is no abdominal tenderness. There is no rebound.  Musculoskeletal:     Cervical back: Normal range of motion.  Skin:    General: Skin is warm and dry.  Neurological:     Mental Status: She is alert and oriented to person, place, and time.     Coordination: Coordination normal.    Vitals:   06/24/23 1047  BP: 138/80  Pulse: 91  Temp: 98.2 F (36.8 C)  TempSrc: Oral  SpO2: 95%  Weight: 196 lb (88.9 kg)  Height: 5' (1.524 m)    Assessment & Plan:  Visit time 20 minutes in face to face communication with patient and coordination of care, additional 10 minutes spent in record review,  coordination or care, ordering tests, communicating/referring to other healthcare professionals, documenting in medical records all on the same day of the visit for total time 30 minutes spent on the visit.

## 2023-07-01 ENCOUNTER — Other Ambulatory Visit

## 2023-07-04 ENCOUNTER — Ambulatory Visit (INDEPENDENT_AMBULATORY_CARE_PROVIDER_SITE_OTHER): Payer: Medicare Other | Admitting: Podiatry

## 2023-07-04 DIAGNOSIS — B351 Tinea unguium: Secondary | ICD-10-CM | POA: Diagnosis not present

## 2023-07-04 DIAGNOSIS — E1149 Type 2 diabetes mellitus with other diabetic neurological complication: Secondary | ICD-10-CM | POA: Diagnosis not present

## 2023-07-04 DIAGNOSIS — M79609 Pain in unspecified limb: Secondary | ICD-10-CM

## 2023-07-05 NOTE — Progress Notes (Signed)
 Subjective: Chief Complaint  Patient presents with   [facility]    R#13 [facility] patient has no other concers just nail trim.    89 year old female presents the office today with the above concerns. No open sores, redness, drianage or sings of infection. No open wounds.   Last A1c 6.9 on 05/27/2023  Adelia Homestead, MD Last seen 06/24/2023  Objective: AAO x3, NAD DP/PT pulses palpable bilaterally, CRT less than 3 seconds Nails are hypertrophic, dystrophic, brittle, discolored, elongated 10. No surrounding redness or drainage. Tenderness nails 1-5 bilaterally.  No ulcerations No pain with calf compression, swelling, warmth, erythema  Assessment: Symptomatic onychomycosis  Plan: -Treatment options discussed including all alternatives, risks, and complications -Etiology of symptoms were discussed -Nails debrided 10 without complications or bleeding. -Daily foot inspection -Follow-up in 3 months or sooner if any problems arise. In the meantime, encouraged to call the office with any questions, concerns, change in symptoms.   Return in about 3 months (around 10/03/2023).  Bobbie Burows, DPM

## 2023-09-13 ENCOUNTER — Other Ambulatory Visit: Payer: Self-pay | Admitting: Internal Medicine

## 2023-10-10 ENCOUNTER — Ambulatory Visit: Admitting: Podiatry

## 2023-10-10 DIAGNOSIS — M79609 Pain in unspecified limb: Secondary | ICD-10-CM | POA: Diagnosis not present

## 2023-10-10 DIAGNOSIS — B351 Tinea unguium: Secondary | ICD-10-CM | POA: Diagnosis not present

## 2023-10-10 DIAGNOSIS — E1149 Type 2 diabetes mellitus with other diabetic neurological complication: Secondary | ICD-10-CM | POA: Diagnosis not present

## 2023-10-10 NOTE — Progress Notes (Signed)
 Subjective: Chief Complaint  Patient presents with   [facility]    Rm 13 Diabetic foot care/ last A1C 6.4/ Patient says she is doing well with no concerns today.    89 year old female presents the office today with the above concerns. No open sores, redness, drianage or sings of infection. No open wounds.  No new concerns today.   Last A1c 6.9 on 05/27/2023  Rollene Almarie LABOR, MD Last seen 06/24/2023  Objective: AAO x3, NAD DP/PT pulses palpable bilaterally, CRT less than 3 seconds Nails are hypertrophic, dystrophic, brittle, discolored, elongated 10. No surrounding redness or drainage. Tenderness nails 1-5 bilaterally.  No ulcerations or open lesions bilaterally.  No pain with calf compression, swelling, warmth, erythema  Assessment: Symptomatic onychomycosis  Plan: -Treatment options discussed including all alternatives, risks, and complications -Etiology of symptoms were discussed -Nails debrided 10 without complications or bleeding. -Daily foot inspection -Follow-up in 3 months or sooner if any problems arise. In the meantime, encouraged to call the office with any questions, concerns, change in symptoms.   Return in about 3 months (around 01/10/2024).  Donnice Fees, DPM

## 2023-10-11 DIAGNOSIS — J4520 Mild intermittent asthma, uncomplicated: Secondary | ICD-10-CM | POA: Diagnosis not present

## 2023-10-11 DIAGNOSIS — Z789 Other specified health status: Secondary | ICD-10-CM | POA: Diagnosis not present

## 2023-10-11 DIAGNOSIS — I272 Pulmonary hypertension, unspecified: Secondary | ICD-10-CM | POA: Diagnosis not present

## 2023-10-11 DIAGNOSIS — Z0000 Encounter for general adult medical examination without abnormal findings: Secondary | ICD-10-CM | POA: Diagnosis not present

## 2023-10-11 DIAGNOSIS — I700 Atherosclerosis of aorta: Secondary | ICD-10-CM | POA: Diagnosis not present

## 2023-10-11 DIAGNOSIS — M159 Polyosteoarthritis, unspecified: Secondary | ICD-10-CM | POA: Diagnosis not present

## 2023-10-11 DIAGNOSIS — I10 Essential (primary) hypertension: Secondary | ICD-10-CM | POA: Diagnosis not present

## 2023-10-11 DIAGNOSIS — E1151 Type 2 diabetes mellitus with diabetic peripheral angiopathy without gangrene: Secondary | ICD-10-CM | POA: Diagnosis not present

## 2023-10-11 DIAGNOSIS — J8410 Pulmonary fibrosis, unspecified: Secondary | ICD-10-CM | POA: Diagnosis not present

## 2023-10-11 DIAGNOSIS — E785 Hyperlipidemia, unspecified: Secondary | ICD-10-CM | POA: Diagnosis not present

## 2023-11-20 ENCOUNTER — Ambulatory Visit

## 2023-11-21 ENCOUNTER — Ambulatory Visit (INDEPENDENT_AMBULATORY_CARE_PROVIDER_SITE_OTHER)

## 2023-11-21 VITALS — Ht 60.0 in | Wt 196.0 lb

## 2023-11-21 DIAGNOSIS — Z0000 Encounter for general adult medical examination without abnormal findings: Secondary | ICD-10-CM | POA: Diagnosis not present

## 2023-11-21 NOTE — Patient Instructions (Signed)
 Ms. Kibble , Thank you for taking time out of your busy schedule to complete your Annual Wellness Visit with me. I enjoyed our conversation and look forward to speaking with you again next year. I, as well as your care team,  appreciate your ongoing commitment to your health goals. Please review the following plan we discussed and let me know if I can assist you in the future. Your Game plan/ To Do List    Follow up Visits: We will see or speak with you next year for your Next Medicare AWV with our clinical staff Have you seen your provider in the last 6 months (3 months if uncontrolled diabetes)? Yes.  Last office visit on 06/24/2023.  Clinician Recommendations:  Aim for 30 minutes of exercise or brisk walking, 6-8 glasses of water, and 5 servings of fruits and vegetables each day. You are due for a diabetic eye exam.  Take care of yourself.      This is a list of the screenings recommended for you:  Health Maintenance  Topic Date Due   Pneumococcal Vaccine for age over 40 (1 of 2 - PCV) Never done   Zoster (Shingles) Vaccine (1 of 2) Never done   DTaP/Tdap/Td vaccine (2 - Tdap) 03/19/2012   Eye exam for diabetics  07/26/2023   Medicare Annual Wellness Visit  12/04/2023   Flu Shot  06/17/2026*   Hemoglobin A1C  11/27/2023   Complete foot exam   10/09/2024   DEXA scan (bone density measurement)  Completed   HPV Vaccine  Aged Out   Meningitis B Vaccine  Aged Out   COVID-19 Vaccine  Discontinued  *Topic was postponed. The date shown is not the original due date.    Advanced directives: (Copy Requested) Please bring a copy of your health care power of attorney and living will to the office to be added to your chart at your convenience. You can mail to [facility] [address] [place], [place] [address] or email to [email or web address] Advance Care Planning is important because it:  [x]  Makes sure you receive the medical care that is consistent with your values,  goals, and preferences  [x]  It provides guidance to your family and loved ones and reduces their decisional burden about whether or not they are making the right decisions based on your wishes.  Follow the link provided in your after visit summary or read over the paperwork we have mailed to you to help you started getting your Advance Directives in place. If you need assistance in completing these, please reach out to us  so that we can help you!  See attachments for Preventive Care and Fall Prevention Tips.

## 2023-11-21 NOTE — Progress Notes (Signed)
 Subjective:   Felicia Martin is a 89 y.o. who presents for a Medicare Wellness preventive visit.  As a reminder, Annual Wellness Visits don't include a physical exam, and some assessments may be limited, especially if this visit is performed virtually. We may recommend an in-person follow-up visit with your provider if needed.  Visit Complete: Virtual I connected with  Felicia Martin on 11/21/23 by a audio enabled telemedicine application and verified that I am speaking with the correct person using two identifiers.  Patient Location: Home  Provider Location: Office/Clinic  I discussed the limitations of evaluation and management by telemedicine. The patient expressed understanding and agreed to proceed.  Vital Signs: Because this visit was a virtual/telehealth visit, some criteria may be missing or patient reported. Any vitals not documented were not able to be obtained and vitals that have been documented are patient reported.  VideoDeclined- This patient declined Librarian, academic. Therefore the visit was completed with audio only.  Persons Participating in Visit: Patient.  AWV Questionnaire: No: Patient Medicare AWV questionnaire was not completed prior to this visit.  Cardiac Risk Factors include: advanced age (>32men, >2 women);diabetes mellitus;hypertension;dyslipidemia;obesity (BMI >30kg/m2)     Objective:    Today's Vitals   11/21/23 0813  Weight: 196 lb (88.9 kg)  Height: 5' (1.524 m)   Body mass index is 38.28 kg/m.     11/21/2023    8:18 AM 06/07/2023   10:20 AM 12/14/2022    1:22 PM 03/22/2022    8:59 AM 11/23/2021    8:33 AM 11/10/2020    8:28 AM 04/30/2019   10:24 AM  Advanced Directives  Does Patient Have a Medical Advance Directive? Yes No No No;Yes Yes Yes Yes  Type of Estate agent of [place];Living will    Healthcare Power of [place];Living will Healthcare Power of [place];Living will Healthcare  Power of [place];Living will  Does patient want to make changes to medical advance directive?       No - Patient declined  Copy of Healthcare Power of Attorney in Chart? No - copy requested    No - copy requested No - copy requested No - copy requested  Would patient like information on creating a medical advance directive?  No - Patient declined No - Patient declined        Current Medications (verified) Outpatient Encounter Medications as of 11/21/2023  Medication Sig   albuterol  (PROVENTIL ) (2.5 MG/3ML) 0.083% nebulizer solution Take 3 mLs (2.5 mg total) by nebulization every 2 (two) hours as needed for wheezing or shortness of breath.   albuterol  (VENTOLIN  HFA) 108 (90 Base) MCG/ACT inhaler Inhale 2 puffs into the lungs every 6 (six) hours as needed. (Patient taking differently: Inhale 2 puffs into the lungs every 4 (four) hours as needed for shortness of breath or wheezing.)   amLODipine  (NORVASC ) 5 MG tablet Take 1 tablet (5 mg total) by mouth daily.   cetirizine  (ZYRTEC ) 10 MG tablet Take 1 tablet (10 mg total) by mouth daily.   fluticasone  (FLONASE ) 50 MCG/ACT nasal spray Place 2 sprays into both nostrils daily. (Patient taking differently: Place 2 sprays into both nostrils daily as needed for allergies or rhinitis.)   Fluticasone -Umeclidin-Vilant (TRELEGY ELLIPTA ) 100-62.5-25 MCG/ACT AEPB INHALE 1 PUFF INTO THE LUNGS DAILY   gabapentin  (NEURONTIN ) 100 MG capsule Take 100 mg by mouth 3 (three) times daily.   glucose blood (ONETOUCH VERIO) test strip USE AS DIRECTED TO CHECK BLOOD SUGAR EVERY DAY  hydrochlorothiazide  (HYDRODIURIL ) 25 MG tablet Take 1 tablet (25 mg total) by mouth daily.   Metamucil Fiber CHEW Chew 1 tablet by mouth daily as needed (for constipation).   metFORMIN  (GLUCOPHAGE -XR) 500 MG 24 hr tablet Take 1 tablet (500 mg total) by mouth daily with breakfast.   No facility-administered encounter medications on file as of 11/21/2023.    Allergies (verified) Statins and  Zetia  [ezetimibe ]   History: Past Medical History:  Diagnosis Date   Asthma    GERD (gastroesophageal reflux disease)    Hypercholesterolemia    Hypertension    Pulmonary fibrosis (HCC)    Sarcoidosis    Surgical history of tubal ligation 1960s   Past Surgical History:  Procedure Laterality Date   COLONOSCOPY  12/09/14   ESOPHAGOGASTRODUODENOSCOPY  12/09/14   LUNG SURGERY  1960   Right lung   Maloney dilation  12/09/14   TUBAL LIGATION  1960   Family History  Problem Relation Age of Onset   Diabetes Sister        x's 3   Alzheimer's disease Sister    Liver cancer Mother    Liver cancer Father    Cancer Father    Cancer Brother    Diabetes Brother    Hypertension Sister        x's 3   Hyperlipidemia Other    Breast cancer Neg Hx    Social History   Socioeconomic History   Marital status: Widowed    Spouse name: Not on file   Number of children: 4   Years of education: Not on file   Highest education level: 12th grade  Occupational History   Occupation: Programmer, applications  Tobacco Use   Smoking status: Former    Current packs/day: 0.00    Average packs/day: 0.5 packs/day for 40.0 years (20.0 ttl pk-yrs)    Types: Cigarettes    Start date: 05/25/1959    Quit date: 05/25/1999    Years since quitting: 24.5   Smokeless tobacco: Never  Vaping Use   Vaping status: Never Used  Substance and Sexual Activity   Alcohol use: No   Drug use: No   Sexual activity: Never  Other Topics Concern   Not on file  Social History Narrative   Lives alone/2025   Social Drivers of Health   Financial Resource Strain: Low Risk  (11/21/2023)   Overall Financial Resource Strain (CARDIA)    Difficulty of Paying Living Expenses: Not hard at all  Food Insecurity: No Food Insecurity (11/21/2023)   Hunger Vital Sign    Worried About Running Out of Food in the Last Year: Never true    Ran Out of Food in the Last Year: Never true  Transportation Needs: No Transportation Needs (11/21/2023)    PRAPARE - Administrator, Civil Service (Medical): No    Lack of Transportation (Non-Medical): No  Physical Activity: Inactive (11/21/2023)   Exercise Vital Sign    Days of Exercise per Week: 0 days    Minutes of Exercise per Session: 0 min  Stress: No Stress Concern Present (11/21/2023)   [employer] of Occupational Health - Occupational Stress Questionnaire    Feeling of Stress: Not at all  Social Connections: Moderately Integrated (11/21/2023)   Social Connection and Isolation Panel    Frequency of Communication with Friends and Family: More than three times a week    Frequency of Social Gatherings with Friends and Family: More than three times a week    Attends  Religious Services: 1 to 4 times per year    Active Member of Clubs or Organizations: Yes    Attends Banker Meetings: More than 4 times per year    Marital Status: Widowed    Tobacco Counseling Counseling given: Not Answered    Clinical Intake:  Pre-visit preparation completed: Yes  Pain : No/denies pain     BMI - recorded: 38.28 Nutritional Status: BMI > 30  Obese Nutritional Risks: None Diabetes: Yes CBG done?: Yes (126-per pt) CBG resulted in Enter/ Edit results?: No Did pt. bring in CBG monitor from home?: No  Lab Results  Component Value Date   HGBA1C 6.9 05/27/2023   HGBA1C 6.7 12/04/2022   HGBA1C 7.0 (H) 03/26/2022     How often do you need to have someone help you when you read instructions, pamphlets, or other written materials from your doctor or pharmacy?: 1 - Never  Interpreter Needed?: No  Information entered by :: Shawnte Winton, RMA   Activities of Daily Living     11/21/2023    8:15 AM 06/07/2023    9:00 PM  In your present state of health, do you have any difficulty performing the following activities:  Hearing? 1 0  Comment wears hearing aides   Vision? 0 0  Difficulty concentrating or making decisions? 0 0  Walking or climbing stairs? 0   Dressing or  bathing? 0   Doing errands, shopping? 0 0  Preparing Food and eating ? N   Using the Toilet? N   In the past six months, have you accidently leaked urine? N   Do you have problems with loss of bowel control? N   Managing your Medications? N   Managing your Finances? N   Housekeeping or managing your Housekeeping? N     Patient Care Team: Rollene Almarie LABOR, MD as PCP - General (Internal Medicine) Merceda Lela SAUNDERS, [facility] (Pharmacist)  I have updated your Care Teams any recent Medical Services you may have received from other providers in the past year.     Assessment:   This is a routine wellness examination for Reshma.  Hearing/Vision screen Hearing Screening - Comments:: wears hearing aides Vision Screening - Comments:: Wears eyeglasses/Vision Works   Goals Addressed   None    Depression Screen     11/21/2023    8:20 AM 12/04/2022    8:54 AM 08/16/2022    3:06 PM 05/23/2022    9:25 AM 05/23/2022    9:24 AM 11/23/2021    8:37 AM 11/10/2020    8:31 AM  PHQ 2/9 Scores  PHQ - 2 Score 0 0 0 0 0 0 0  PHQ- 9 Score 0   0       Fall Risk     11/21/2023    8:19 AM 06/24/2023   10:53 AM 06/04/2023   10:37 AM 05/27/2023    8:11 AM 12/31/2022    9:51 AM  Fall Risk   Falls in the past year? 0 0  0 0  Number falls in past yr: 0 0 0 0 0  Injury with Fall? 0 0 0 0 0  Follow up Falls evaluation completed;Falls prevention discussed Falls evaluation completed Falls evaluation completed  Falls evaluation completed    MEDICARE RISK AT HOME:  Medicare Risk at Home Any stairs in or around the home?: No If so, are there any without handrails?: No Home free of loose throw rugs in walkways, pet beds, electrical cords, etc?: Yes  Adequate lighting in your home to reduce risk of falls?: Yes Life alert?: No Use of a cane, walker or w/c?: No Grab bars in the bathroom?: Yes Shower chair or bench in shower?: Yes Elevated toilet seat or a handicapped toilet?: Yes  TIMED UP AND GO:  Was  the test performed?  No  Cognitive Function: Declined/Normal: No cognitive concerns noted by patient or family. Patient alert, oriented, able to answer questions appropriately and recall recent events. No signs of memory loss or confusion.    03/26/2017    1:07 PM 02/23/2016   11:19 AM 02/23/2015    9:02 AM  MMSE - Mini Mental State Exam  Orientation to time 5  5  5    Orientation to Place 5  5  5    Registration 3  3  3    Attention/ Calculation 5  5  5    Recall 3  2  3    Language- name 2 objects 2  2  2    Language- repeat 1 1 1   Language- follow 3 step command 3  3  3    Language- read & follow direction 1  1  1    Write a sentence 1  1  1    Copy design 1  1  1    Total score 30  29  30       Data saved with a previous flowsheet row definition        11/23/2021    8:53 AM  6CIT Screen  What Year? 0 points  What month? 0 points  What time? 0 points  Count back from 20 0 points  Months in reverse 0 points  Repeat phrase 4 points  Total Score 4 points    Immunizations Immunization History  Administered Date(s) Administered   PFIZER(Purple Top)SARS-COV-2 Vaccination 06/01/2019, 06/23/2019   Td 03/19/2002    Screening Tests Health Maintenance  Topic Date Due   Pneumococcal Vaccine: 50+ Years (1 of 2 - PCV) Never done   Zoster Vaccines- Shingrix (1 of 2) Never done   DTaP/Tdap/Td (2 - Tdap) 03/19/2012   OPHTHALMOLOGY EXAM  07/26/2023   INFLUENZA VACCINE  06/17/2026 (Originally 10/18/2023)   HEMOGLOBIN A1C  11/27/2023   FOOT EXAM  10/09/2024   Medicare Annual Wellness (AWV)  11/20/2024   DEXA SCAN  Completed   HPV VACCINES  Aged Out   Meningococcal B Vaccine  Aged Out   COVID-19 Vaccine  Discontinued    Health Maintenance  Health Maintenance Due  Topic Date Due   Pneumococcal Vaccine: 50+ Years (1 of 2 - PCV) Never done   Zoster Vaccines- Shingrix (1 of 2) Never done   DTaP/Tdap/Td (2 - Tdap) 03/19/2012   OPHTHALMOLOGY EXAM  07/26/2023   Health Maintenance Items  Addressed: See Nurse Notes at the end of this note  Additional Screening:  Vision Screening: Recommended annual ophthalmology exams for early detection of glaucoma and other disorders of the eye. Would you like a referral to an eye doctor? No    Dental Screening: Recommended annual dental exams for proper oral hygiene  Community Resource Referral / Chronic Care Management: CRR required this visit?  No   CCM required this visit?  No   Plan:    I have personally reviewed and noted the following in the patient's chart:   Medical and social history Use of alcohol, tobacco or illicit drugs  Current medications and supplements including opioid prescriptions. Patient is not currently taking opioid prescriptions. Functional ability and status Nutritional status  Physical activity Advanced directives List of other physicians Hospitalizations, surgeries, and ER visits in previous 12 months Vitals Screenings to include cognitive, depression, and falls Referrals and appointments  In addition, I have reviewed and discussed with patient certain preventive protocols, quality metrics, and best practice recommendations. A written personalized care plan for preventive services as well as general preventive health recommendations were provided to patient.   Ranell Skibinski L Taner Rzepka, CMA   11/21/2023   After Visit Summary: (MyChart) Due to this being a telephonic visit, the after visit summary with patients personalized plan was offered to patient via MyChart   Notes: Patient is due for a diabetic eye exam.  She declines any vaccines for now.  Patient stated that she has changed to a new primary care provider.  She will no longer be a patient of Dr. Rogena.

## 2023-12-19 ENCOUNTER — Telehealth: Payer: Self-pay | Admitting: Podiatry

## 2023-12-19 NOTE — Telephone Encounter (Signed)
 Called and left vm requesting a call back. Patient needs to be rescheduled due to provider out of office.

## 2024-01-09 ENCOUNTER — Ambulatory Visit: Admitting: Podiatry

## 2024-01-09 ENCOUNTER — Encounter: Payer: Self-pay | Admitting: Podiatry

## 2024-01-09 VITALS — Ht 60.0 in | Wt 196.0 lb

## 2024-01-09 DIAGNOSIS — E1149 Type 2 diabetes mellitus with other diabetic neurological complication: Secondary | ICD-10-CM | POA: Diagnosis not present

## 2024-01-09 DIAGNOSIS — B351 Tinea unguium: Secondary | ICD-10-CM

## 2024-01-09 DIAGNOSIS — M79609 Pain in unspecified limb: Secondary | ICD-10-CM | POA: Diagnosis not present

## 2024-01-09 NOTE — Progress Notes (Unsigned)
 Subjective: Chief Complaint  Patient presents with   Nail Problem    Pt is here for RFC.     89 year old female presents the office today with the above concerns. No open sores, redness, drianage or sings of infection. No open wounds.  No new concerns today.   Last A1c 6.9 on 05/27/2023  Rollene Almarie LABOR, MD Last seen 06/24/2023  Objective: AAO x3, NAD DP/PT pulses palpable bilaterally, CRT less than 3 seconds Nails are hypertrophic, dystrophic, brittle, discolored, elongated 10. No surrounding redness or drainage. Tenderness nails 1-5 bilaterally.  No ulcerations or open lesions bilaterally.  No pain with calf compression, swelling, warmth, erythema  Assessment: Symptomatic onychomycosis  Plan: -Treatment options discussed including all alternatives, risks, and complications -Etiology of symptoms were discussed -Nails debrided 10 without complications or bleeding. -Daily foot inspection -Follow-up in 3 months or sooner if any problems arise. In the meantime, encouraged to call the office with any questions, concerns, change in symptoms.   Return in about 3 months (around 04/10/2024).   Donnice Fees, DPM

## 2024-01-10 ENCOUNTER — Ambulatory Visit: Admitting: Podiatry

## 2024-04-10 ENCOUNTER — Ambulatory Visit: Admitting: Podiatry

## 2024-04-10 DIAGNOSIS — E1149 Type 2 diabetes mellitus with other diabetic neurological complication: Secondary | ICD-10-CM | POA: Diagnosis not present

## 2024-04-10 DIAGNOSIS — B351 Tinea unguium: Secondary | ICD-10-CM | POA: Diagnosis not present

## 2024-04-10 DIAGNOSIS — M79609 Pain in unspecified limb: Secondary | ICD-10-CM | POA: Diagnosis not present

## 2024-04-10 NOTE — Progress Notes (Signed)
 Subjective: Chief Complaint  Patient presents with   [facility]    NIDDM Patient with an A1C of  6.9 presents to office today for a [facility] and nail trim.     89 year old female presents the office today with the above concerns. No open sores, redness, drianage or sings of infection. No open wounds.  No new concerns today.   Last A1c 6.9 on 05/27/2023  Rollene Almarie LABOR, MD Last seen 06/24/2023  Objective: AAO x3, NAD DP/PT pulses palpable bilaterally, CRT less than 3 seconds Nails are hypertrophic, dystrophic, brittle, discolored, elongated 10. No surrounding redness or drainage. Tenderness nails 1-5 bilaterally.  Incurvation of left hallux toenail worse than right side.  No signs of infection. No ulcerations or open lesions bilaterally.  No pain with calf compression, swelling, warmth, erythema  Assessment: Symptomatic onychomycosis  Plan: -Treatment options discussed including all alternatives, risks, and complications -Etiology of symptoms were discussed -Nails debrided 10 without complications or bleeding. -Daily foot inspection -Follow-up in 3 months or sooner if any problems arise. In the meantime, encouraged to call the office with any questions, concerns, change in symptoms.   Return in about 3 months (around 07/09/2024) for nail trim.   Donnice Fees, DPM

## 2024-07-09 ENCOUNTER — Ambulatory Visit: Admitting: Podiatry
